# Patient Record
Sex: Male | Born: 1960 | Race: White | Hispanic: No | Marital: Married | Smoking: Former smoker
Health system: Southern US, Community
[De-identification: ages and names within clinical notes are randomized; demographics above are authoritative.]

## PROBLEM LIST (undated history)

## (undated) DIAGNOSIS — C801 Malignant (primary) neoplasm, unspecified: Secondary | ICD-10-CM

## (undated) DIAGNOSIS — M545 Low back pain, unspecified: Secondary | ICD-10-CM

## (undated) DIAGNOSIS — K219 Gastro-esophageal reflux disease without esophagitis: Secondary | ICD-10-CM

## (undated) DIAGNOSIS — R251 Tremor, unspecified: Secondary | ICD-10-CM

## (undated) DIAGNOSIS — C4490 Unspecified malignant neoplasm of skin, unspecified: Secondary | ICD-10-CM

## (undated) DIAGNOSIS — E785 Hyperlipidemia, unspecified: Secondary | ICD-10-CM

## (undated) DIAGNOSIS — G40909 Epilepsy, unspecified, not intractable, without status epilepticus: Secondary | ICD-10-CM

## (undated) DIAGNOSIS — K449 Diaphragmatic hernia without obstruction or gangrene: Secondary | ICD-10-CM

## (undated) HISTORY — PX: NO PAST SURGERIES: SHX2092

## (undated) HISTORY — PX: POLYPECTOMY: SHX149

## (undated) HISTORY — DX: Low back pain, unspecified: M54.50

## (undated) HISTORY — DX: Gastro-esophageal reflux disease without esophagitis: K21.9

## (undated) HISTORY — DX: Epilepsy, unspecified, not intractable, without status epilepticus: G40.909

## (undated) HISTORY — DX: Hyperlipidemia, unspecified: E78.5

## (undated) HISTORY — PX: COLONOSCOPY: SHX174

## (undated) HISTORY — DX: Unspecified malignant neoplasm of skin, unspecified: C44.90

## (undated) HISTORY — DX: Low back pain: M54.5

## (undated) HISTORY — DX: Malignant (primary) neoplasm, unspecified: C80.1

## (undated) HISTORY — DX: Tremor, unspecified: R25.1

---

## 2000-01-27 ENCOUNTER — Emergency Department (HOSPITAL_COMMUNITY): Admission: EM | Admit: 2000-01-27 | Discharge: 2000-01-27 | Payer: Self-pay

## 2000-01-27 ENCOUNTER — Encounter: Payer: Self-pay | Admitting: Emergency Medicine

## 2000-02-06 ENCOUNTER — Emergency Department (HOSPITAL_COMMUNITY): Admission: EM | Admit: 2000-02-06 | Discharge: 2000-02-06 | Payer: Self-pay | Admitting: Emergency Medicine

## 2000-02-10 ENCOUNTER — Emergency Department (HOSPITAL_COMMUNITY): Admission: EM | Admit: 2000-02-10 | Discharge: 2000-02-10 | Payer: Self-pay | Admitting: Emergency Medicine

## 2000-02-10 ENCOUNTER — Encounter: Payer: Self-pay | Admitting: Emergency Medicine

## 2000-04-28 ENCOUNTER — Emergency Department (HOSPITAL_COMMUNITY): Admission: EM | Admit: 2000-04-28 | Discharge: 2000-04-28 | Payer: Self-pay

## 2000-06-25 ENCOUNTER — Encounter: Admission: RE | Admit: 2000-06-25 | Discharge: 2000-07-07 | Payer: Self-pay | Admitting: Family Medicine

## 2004-09-24 ENCOUNTER — Ambulatory Visit: Payer: Self-pay | Admitting: Family Medicine

## 2006-02-04 ENCOUNTER — Ambulatory Visit: Payer: Self-pay | Admitting: Family Medicine

## 2006-11-16 ENCOUNTER — Ambulatory Visit: Payer: Self-pay | Admitting: Family Medicine

## 2007-04-21 ENCOUNTER — Ambulatory Visit: Payer: Self-pay | Admitting: Family Medicine

## 2007-04-21 DIAGNOSIS — K219 Gastro-esophageal reflux disease without esophagitis: Secondary | ICD-10-CM | POA: Insufficient documentation

## 2007-08-06 ENCOUNTER — Ambulatory Visit: Payer: Self-pay | Admitting: Family Medicine

## 2007-08-13 ENCOUNTER — Encounter (INDEPENDENT_AMBULATORY_CARE_PROVIDER_SITE_OTHER): Payer: Self-pay | Admitting: Internal Medicine

## 2008-05-26 ENCOUNTER — Encounter (INDEPENDENT_AMBULATORY_CARE_PROVIDER_SITE_OTHER): Payer: Self-pay | Admitting: *Deleted

## 2008-07-25 ENCOUNTER — Ambulatory Visit: Payer: Self-pay | Admitting: Family Medicine

## 2008-07-27 DIAGNOSIS — Z8669 Personal history of other diseases of the nervous system and sense organs: Secondary | ICD-10-CM | POA: Insufficient documentation

## 2008-08-04 ENCOUNTER — Ambulatory Visit: Payer: Self-pay | Admitting: Family Medicine

## 2008-08-08 ENCOUNTER — Telehealth (INDEPENDENT_AMBULATORY_CARE_PROVIDER_SITE_OTHER): Payer: Self-pay | Admitting: Internal Medicine

## 2008-08-11 ENCOUNTER — Encounter (INDEPENDENT_AMBULATORY_CARE_PROVIDER_SITE_OTHER): Payer: Self-pay | Admitting: Internal Medicine

## 2008-08-29 ENCOUNTER — Telehealth (INDEPENDENT_AMBULATORY_CARE_PROVIDER_SITE_OTHER): Payer: Self-pay | Admitting: Internal Medicine

## 2009-01-11 ENCOUNTER — Encounter (INDEPENDENT_AMBULATORY_CARE_PROVIDER_SITE_OTHER): Payer: Self-pay | Admitting: Internal Medicine

## 2009-01-11 ENCOUNTER — Telehealth (INDEPENDENT_AMBULATORY_CARE_PROVIDER_SITE_OTHER): Payer: Self-pay | Admitting: Internal Medicine

## 2009-01-12 ENCOUNTER — Ambulatory Visit: Payer: Self-pay | Admitting: Family Medicine

## 2009-01-15 LAB — CONVERTED CEMR LAB
ALT: 27 units/L (ref 0–53)
AST: 27 units/L (ref 0–37)
BUN: 18 mg/dL (ref 6–23)
CO2: 31 meq/L (ref 19–32)
Calcium: 9.8 mg/dL (ref 8.4–10.5)
Chloride: 103 meq/L (ref 96–112)
Cholesterol: 263 mg/dL — ABNORMAL HIGH (ref 0–200)
Creatinine, Ser: 1 mg/dL (ref 0.4–1.5)
Direct LDL: 172.2 mg/dL
GFR calc non Af Amer: 84.8 mL/min (ref >60)
Glucose, Bld: 95 mg/dL (ref 70–99)
HDL: 70.1 mg/dL (ref >39.00)
PSA: 0.77 ng/mL (ref 0.10–4.00)
Potassium: 4.5 meq/L (ref 3.5–5.1)
Sodium: 143 meq/L (ref 135–145)
TSH: 0.97 microintl units/mL (ref 0.35–5.50)
Total CHOL/HDL Ratio: 4
Triglycerides: 100 mg/dL (ref 0.0–149.0)
VLDL: 20 mg/dL (ref 0.0–40.0)

## 2009-01-26 ENCOUNTER — Ambulatory Visit: Payer: Self-pay | Admitting: Family Medicine

## 2009-01-26 DIAGNOSIS — E785 Hyperlipidemia, unspecified: Secondary | ICD-10-CM | POA: Insufficient documentation

## 2009-04-27 ENCOUNTER — Ambulatory Visit: Payer: Self-pay | Admitting: Family Medicine

## 2009-05-01 LAB — CONVERTED CEMR LAB
Cholesterol: 275 mg/dL — ABNORMAL HIGH (ref 0–200)
HDL: 63.5 mg/dL (ref >39.00)
VLDL: 26.2 mg/dL (ref 0.0–40.0)

## 2009-05-30 ENCOUNTER — Ambulatory Visit: Payer: Self-pay | Admitting: Family Medicine

## 2009-07-02 ENCOUNTER — Ambulatory Visit: Payer: Self-pay | Admitting: Family Medicine

## 2009-07-04 LAB — CONVERTED CEMR LAB
AST: 21 units/L (ref 0–37)
Cholesterol: 172 mg/dL (ref 0–200)
LDL Cholesterol: 94 mg/dL (ref 0–99)
Total CHOL/HDL Ratio: 3
Triglycerides: 82 mg/dL (ref 0.0–149.0)
VLDL: 16.4 mg/dL (ref 0.0–40.0)

## 2009-11-27 ENCOUNTER — Emergency Department (HOSPITAL_COMMUNITY): Admission: EM | Admit: 2009-11-27 | Discharge: 2009-11-28 | Payer: Self-pay | Admitting: Emergency Medicine

## 2009-11-27 ENCOUNTER — Encounter: Payer: Self-pay | Admitting: Family Medicine

## 2009-11-29 ENCOUNTER — Ambulatory Visit: Payer: Self-pay | Admitting: Family Medicine

## 2009-12-04 ENCOUNTER — Telehealth (INDEPENDENT_AMBULATORY_CARE_PROVIDER_SITE_OTHER): Payer: Self-pay | Admitting: *Deleted

## 2009-12-05 ENCOUNTER — Ambulatory Visit: Payer: Self-pay | Admitting: Cardiology

## 2009-12-05 ENCOUNTER — Encounter (HOSPITAL_COMMUNITY): Admission: RE | Admit: 2009-12-05 | Discharge: 2010-02-19 | Payer: Self-pay | Admitting: Family Medicine

## 2009-12-05 ENCOUNTER — Ambulatory Visit: Payer: Self-pay

## 2010-01-03 ENCOUNTER — Ambulatory Visit: Payer: Self-pay | Admitting: Family Medicine

## 2010-01-03 LAB — CONVERTED CEMR LAB
ALT: 20 units/L (ref 0–53)
AST: 22 units/L (ref 0–37)
Cholesterol: 193 mg/dL (ref 0–200)
LDL Cholesterol: 108 mg/dL — ABNORMAL HIGH (ref 0–99)
VLDL: 18.6 mg/dL (ref 0.0–40.0)

## 2010-01-17 ENCOUNTER — Ambulatory Visit: Payer: Self-pay | Admitting: Family Medicine

## 2010-08-20 NOTE — Assessment & Plan Note (Signed)
Summary: F/U [place] ON 11/27/09/[facility] PT/CLE   Vital Signs:  Patient profile:   50 year old male Height:      75.5 inches Weight:      234.6 pounds BMI:     29.04 Temp:     98.1 degrees F oral Pulse rate:   84 / minute Pulse rhythm:   regular BP sitting:   118 / 78  (left arm) Cuff size:   regular  Vitals Entered By: Benny Lennert CMA Duncan Dull) (Nov 29, 2009 12:21 PM)  History of Present Illness: Chief complaint follow up [place] 11-27-09 (vertigo)  50 year old male:  1. Chest pain, recent r/o in ER CP unit, 11/27/2009: Cardiac Risk factors: Lipids h/o tob CE negative x 2 at ER FH cardiac disease: mother, father, multiple grandparents, sister with a CVA  EKG negative at ER  Chest pain last night Initially was having substernal chest pain, radiating to L arm. Nausea. Began 2 days ago while at work and emergently to the ER.   2. Vertigo -- on antivert. Given some in the ER, has been having for a week or so, and symptoms resolve with meds. Inducible with head motion.  Tuesday was at work and around two thirty, started to get hot in the face. When he laid down, got to get a lot of dizziness. Took glasses off and was getiting beetter and worse and also had some chest pain  Allergies: 1)  ! Gaviscon 2)  ! Nexium (Esomeprazole Magnesium)  Past History:  Past medical, surgical, family and social histories (including risk factors) reviewed, and no changes noted (except as noted below).  Past Medical History: HYPERLIPIDEMIA (ICD-272.4) CHEST PAIN (ICD-786.50) SEIZURES, HX OF (DR. LOVE) (ICD-V12.49) GERD (ICD-530.81)  Family History: Reviewed history from 04/21/2007 and no changes required. DM-0 MI-multiple CVA- sister Prostate Cancer-0 Breast Cancer-0 Ovarian Cancer-0 Uterine Cancer-0 Colon Cancer-0 Drug/ ETOH Abuse- br--beer Depression-   Social History: Reviewed history from 04/21/2007 and no changes required. Marital Status: Married Children:    Occupation:   Review of Systems      See HPI CV:  Complains of chest pain or discomfort; denies palpitations, swelling of feet, and swelling of hands. Neuro:  Complains of sensation of room spinning.  Physical Exam  General:  Well-developed,well-nourished,in no acute distress; alert,appropriate and cooperative throughout examination Head:  Normocephalic and atraumatic without obvious abnormalities. No apparent alopecia or balding. Ears:  External ear exam shows no significant lesions or deformities.  Otoscopic examination revealscerumen impaction. Hearing is grossly normal bilaterally.  With rotational motions on stool, vertigo induced Nose:  no external deformity.   Mouth:  Oral mucosa and oropharynx without lesions or exudates.  Teeth in good repair. Neck:  No deformities, masses, or tenderness noted. Lungs:  Normal respiratory effort, chest expands symmetrically. Lungs are clear to auscultation, no crackles or wheezes. Heart:  Normal rate and regular rhythm. S1 and S2 normal without gallop, murmur, click, rub or other extra sounds. Extremities:  No clubbing, cyanosis, edema, or deformity noted with normal full range of motion of all joints.   Neurologic:  alert & oriented X3.   Cervical Nodes:  No lymphadenopathy noted Psych:  Cognition and judgment appear intact. Alert and cooperative with normal attention span and concentration. No apparent delusions, illusions, hallucinations   Impression & Recommendations:  Problem # 1:  CHEST PAIN (ICD-786.50) Substernal chest pain Arm tingling, left nausea  EKG and CE enzymes negative, but patient with very strong FH of CAD, stroke,  current hyperlipidemia, h/o smoking. Significant risk of CAD -- never had a stress test. Will obtain an exercise cardiolite to evaluate for coronary artery disease. There is significant clinical risk in this case, and further evaluation is needed.  Orders: Cardiolite (Cardiolite)  Problem # 2:  VERTIGO  (ICD-780.4) Assessment: New  His updated medication list for this problem includes:    Antivert 25 Mg Tabs (Meclizine hcl) .Marland Kitchen... As needed  Demonstrated maneuvers to self-treat vertigo. Patient to call to be seen if no improvement in 10-14 days, sooner if worse.   Problem # 3:  HYPERLIPIDEMIA (ICD-272.4)  His updated medication list for this problem includes:    Simvastatin 40 Mg Tabs (Simvastatin) .Marland Kitchen... Take 1 each day for cholesterol  Problem # 4:  CERUMEN IMPACTION, BILATERAL (ICD-380.4) ears cleaned  Complete Medication List: 1)  Lansoprazole 30 Mg Cpdr (Lansoprazole) .Marland Kitchen.. 1 by mouth daily 2)  Dilantin 100 Mg Caps (Phenytoin sodium extended) .... 2 capsules in am and 3 capsules at night 3)  Keppra 500 Mg Tabs (Levetiracetam) .... 1/2 tabet twice a day by mouth 4)  Mucinex 600 Mg Xr12h-tab (Guaifenesin) .... Otc as directed. 5)  Simvastatin 40 Mg Tabs (Simvastatin) .... Take 1 each day for cholesterol 6)  Antivert 25 Mg Tabs (Meclizine hcl) .... As needed  Patient Instructions: 1)  Referral Appointment Information 2)  Day/Date: 3)  Time: 4)  Place/MD: 5)  Address: 6)  Phone/Fax: 7)  Patient given appointment information. Information/Orders faxed/mailed.  Prescriptions: LANSOPRAZOLE 30 MG CPDR (LANSOPRAZOLE) 1 by mouth daily  #30 x 5   Entered and Authorized by:   Hannah Beat MD   Signed by:   Hannah Beat MD on 11/29/2009   Method used:   [facility]ron[facility] to        [facility]  [address] [phone][facility] (retail)       [address]       [place], [place]  [address]       Ph: [phone]       Fax: [phone]   RxID:   [id number]   Current Allergies (reviewed today): ! GAVISCON ! NEXIUM (ESOMEPRAZOLE MAGNESIUM)

## 2010-08-20 NOTE — Letter (Signed)
Summary: Out of Work  [facility]  [address] [place], [place] [address]   Phone: [phone]  Fax: [phone]    Nov 29, 2009   Employee:  Wayne Vang    To Whom It May Concern:   For Medical reasons, please excuse the above named employee from work for the following dates:  Start:   11/27/2009  End:   May return to work on 11/30/2009  If you need additional information, please feel free to contact our office.         Sincerely,    Hannah Beat, MD

## 2010-08-20 NOTE — Assessment & Plan Note (Signed)
Summary: Cardiology Nuclear Study  Nuclear Med Background Indications for Stress Test: Evaluation for Ischemia, Post Hospital  Indications Comments: 11/27/09 [facility] ER CP/Dizziness (-) enzymes    Symptoms: Chest Pain, Dizziness, Light-Headedness, Nausea    Nuclear Pre-Procedure Cardiac Risk Factors: Family History - CAD, History of Smoking Caffeine/Decaff Intake: None NPO After: 6:00 PM Lungs: clear IV 0.9% NS with Angio Cath: 18g     IV Site: (L) AC IV Started by: Stanton Kidney EMT-P Chest Size (in) 44     Height (in): 75.5 Weight (lb): 237 BMI: 29.34  Nuclear Med Study 1 or 2 day study:  1 day     Stress Test Type:  Stress Reading MD:  Olga Millers, MD     Referring MD:  Kerin Perna Resting Radionuclide:  Technetium 25m Tetrofosmin     Resting Radionuclide Dose:  11 mCi  Stress Radionuclide:  Technetium 50m Tetrofosmin     Stress Radionuclide Dose:  33 mCi   Stress Protocol Exercise Time (min):  7:30 min     Max HR:  173 bpm     Predicted Max HR:  172 bpm  Max Systolic BP: 180 mm Hg     Percent Max HR:  100.58 %     METS: 9.3 Rate Pressure Product:  16109    Stress Test Technologist:  Milana Na EMT-P     Nuclear Technologist:  Domenic Polite CNMT  Rest Procedure  Myocardial perfusion imaging was performed at rest 45 minutes following the intravenous administration of Myoview Technetium 40m Tetrofosmin.  Stress Procedure  The patient exercised for 7:30. The patient stopped due to fatigue and denied any chest pain.  There were no significant ST-T wave changes and a rare pvc.  Myoview was injected at peak exercise and myocardial perfusion imaging was performed after a brief delay.  QPS Raw Data Images:  Acuisition technically good; normal left ventricular size. Stress Images:  There is normal uptake in all areas. Rest Images:  Normal homogeneous uptake in all areas of the myocardium. Subtraction (SDS):  No evidence of ischemia. Transient Ischemic Dilatation:   .96  (Normal <1.22)  Lung/Heart Ratio:  .32  (Normal <0.45)  Quantitative Gated Spect Images QGS EDV:  104 ml QGS ESV:  31 ml QGS EF:  70 % QGS cine images:  Normal wall motion.   Overall Impression  Exercise Capacity: Fair exercise capacity. BP Response: Normal blood pressure response. Clinical Symptoms: No chest pain ECG Impression: No significant ST segment change suggestive of ischemia. Overall Impression: There is no sign of scar or ischemia.  Appended Document: Cardiology Nuclear Study call  patients stress test was perfectly normal very good  Appended Document: Cardiology Nuclear Study Patient advised.Consuello Masse CMA

## 2010-08-20 NOTE — Progress Notes (Signed)
Summary: Nuclear Pre-Procedure  Phone Note Outgoing Call   Call placed by: Milana Na, EMT-P,  Dec 04, 2009 3:44 PM Summary of Call: Left message with information on Myoview Information Sheet (see scanned document for details).      Nuclear Med Background Indications for Stress Test: Evaluation for Ischemia, Post Hospital  Indications Comments: 11/27/09 [facility] ER CP/Dizziness (-) enzymes    Symptoms: Chest Pain, Nausea    Nuclear Pre-Procedure Cardiac Risk Factors: Family History - CAD, History of Smoking Height (in): 75.5  Nuclear Med Study Referring MD:  Kerin Perna

## 2010-08-20 NOTE — Assessment & Plan Note (Addendum)
Summary: F/U LABWORK/CLE   Vital Signs:  Patient profile:   50 year old male Height:      75.5 inches Weight:      239.8 pounds BMI:     29.68 Temp:     98.5 degrees F oral Pulse rate:   84 / minute Pulse rhythm:   regular BP sitting:   130 / 80  (left arm) Cuff size:   regular  Vitals Entered By: Benny Lennert CMA Duncan Dull) (January 17, 2010 8:13 AM)  History of Present Illness: Chief complaint followup labwork  50 year old  f/u Chol: reviewed labs, tol meds fine not following good diet  ROS: GEN: No acute illnesses, no fevers, chills, sweats, fatigue, weight loss, or URI sx. GI: No n/v/d Pulm: No SOB, cough, wheezing Interactive and getting along well at home.  Otherwise, ROS is as per the HPI.   GEN: WDWN, NAD, Non-toxic, A & O x 3 HEENT: Atraumatic, Normocephalic. Neck supple. No masses, No LAD. Ears and Nose: No external deformity. CV: RRR, No M/G/R. No JVD. No thrill. No extra heart sounds. PULM: CTA B, no wheezes, crackles, rhonchi. No retractions. No resp. distress. No accessory muscle use. EXTR: No c/c/e NEURO: Normal gait.  PSYCH: Normally interactive. Conversant. Not depressed or anxious appearing.  Calm demeanor.    Clinical Review Panels:  Prevention   Last PSA:  0.77 (01/12/2009)  Lipid Management   Cholesterol:  193 (01/03/2010)   LDL (bad choesterol):  108 (01/03/2010)   HDL (good cholesterol):  66.40 (01/03/2010)   Allergies: 1)  ! Gaviscon 2)  ! Nexium (Esomeprazole Magnesium)  Past History:  Past medical, surgical, family and social histories (including risk factors) reviewed, and no changes noted (except as noted below).  Past Medical History: Reviewed history from 11/29/2009 and no changes required. HYPERLIPIDEMIA (ICD-272.4) CHEST PAIN (ICD-786.50) SEIZURES, HX OF (DR. LOVE) (ICD-V12.49) GERD (ICD-530.81)  Family History: Reviewed history from 11/29/2009 and no changes required. DM-0 MI-multiple CVA- sister Prostate  Cancer-0 Breast Cancer-0 Ovarian Cancer-0 Uterine Cancer-0 Colon Cancer-0 Drug/ ETOH Abuse- br--beer Depression-   Social History: Reviewed history from 04/21/2007 and no changes required. Marital Status: Married Children:  Occupation:    Impression & Recommendations:  Problem # 1:  HYPERLIPIDEMIA (ICD-272.4) stable  His updated medication list for this problem includes:    Simvastatin 40 Mg Tabs (Simvastatin) .Marland Kitchen... Take 1 each day for cholesterol  Complete Medication List: 1)  Lansoprazole 30 Mg Cpdr (Lansoprazole) .Marland Kitchen.. 1 by mouth daily 2)  Dilantin 100 Mg Caps (Phenytoin sodium extended) .... 2 capsules in am and 3 capsules at night 3)  Keppra 500 Mg Tabs (Levetiracetam) .... 1/2 tabet twice a day by mouth 4)  Mucinex 600 Mg Xr12h-tab (Guaifenesin) .... Otc as directed. 5)  Simvastatin 40 Mg Tabs (Simvastatin) .... Take 1 each day for cholesterol 6)  Antivert 25 Mg Tabs (Meclizine hcl) .... As needed  Patient Instructions: 1)  PHYSICAL NEXT YEAR 2)  Prephysical Labs, several days before, fasting 3)  BMP, HFP, FLP, CBC with diff, TSH, PSA: 272.4, v77.1, ,780.79, v76.44   Current Allergies (reviewed today): ! GAVISCON ! NEXIUM (ESOMEPRAZOLE MAGNESIUM)  Appended Document: F/U LABWORK/CLE discuss at cpx

## 2010-09-03 ENCOUNTER — Encounter (INDEPENDENT_AMBULATORY_CARE_PROVIDER_SITE_OTHER): Payer: Self-pay | Admitting: *Deleted

## 2010-09-03 ENCOUNTER — Other Ambulatory Visit: Payer: Self-pay | Admitting: Family Medicine

## 2010-09-03 ENCOUNTER — Other Ambulatory Visit (INDEPENDENT_AMBULATORY_CARE_PROVIDER_SITE_OTHER): Payer: PRIVATE HEALTH INSURANCE

## 2010-09-03 DIAGNOSIS — R5381 Other malaise: Secondary | ICD-10-CM | POA: Insufficient documentation

## 2010-09-03 DIAGNOSIS — R5383 Other fatigue: Secondary | ICD-10-CM | POA: Insufficient documentation

## 2010-09-03 DIAGNOSIS — E785 Hyperlipidemia, unspecified: Secondary | ICD-10-CM

## 2010-09-03 DIAGNOSIS — Z131 Encounter for screening for diabetes mellitus: Secondary | ICD-10-CM

## 2010-09-03 DIAGNOSIS — Z125 Encounter for screening for malignant neoplasm of prostate: Secondary | ICD-10-CM

## 2010-09-03 LAB — HEPATIC FUNCTION PANEL
ALT: 22 U/L (ref 0–53)
AST: 22 U/L (ref 0–37)
Alkaline Phosphatase: 85 U/L (ref 39–117)
Total Bilirubin: 0.7 mg/dL (ref 0.3–1.2)

## 2010-09-03 LAB — CBC WITH DIFFERENTIAL/PLATELET
Basophils Absolute: 0 10*3/uL (ref 0.0–0.1)
Eosinophils Absolute: 0.3 10*3/uL (ref 0.0–0.7)
HCT: 47 % (ref 39.0–52.0)
Hemoglobin: 16.4 g/dL (ref 13.0–17.0)
Lymphs Abs: 1.7 10*3/uL (ref 0.7–4.0)
MCHC: 34.9 g/dL (ref 30.0–36.0)
MCV: 90 fl (ref 78.0–100.0)
Monocytes Absolute: 0.4 10*3/uL (ref 0.1–1.0)
Neutro Abs: 3.3 10*3/uL (ref 1.4–7.7)
Platelets: 174 10*3/uL (ref 150.0–400.0)
RDW: 13.2 % (ref 11.5–14.6)

## 2010-09-03 LAB — BASIC METABOLIC PANEL
BUN: 14 mg/dL (ref 6–23)
CO2: 30 mEq/L (ref 19–32)
Glucose, Bld: 88 mg/dL (ref 70–99)
Potassium: 4.4 mEq/L (ref 3.5–5.1)
Sodium: 143 mEq/L (ref 135–145)

## 2010-09-03 LAB — LIPID PANEL: Cholesterol: 178 mg/dL (ref 0–200)

## 2010-09-03 LAB — PSA: PSA: 0.49 ng/mL (ref 0.10–4.00)

## 2010-09-09 ENCOUNTER — Encounter: Payer: Self-pay | Admitting: Family Medicine

## 2010-09-09 ENCOUNTER — Encounter (INDEPENDENT_AMBULATORY_CARE_PROVIDER_SITE_OTHER): Payer: PRIVATE HEALTH INSURANCE | Admitting: Family Medicine

## 2010-09-09 DIAGNOSIS — Z0000 Encounter for general adult medical examination without abnormal findings: Secondary | ICD-10-CM

## 2010-09-17 NOTE — Assessment & Plan Note (Signed)
Summary: cpe DLO   Vital Signs:  Patient profile:   50 year old male Height:      75.5 inches Weight:      239.75 pounds BMI:     29.68 Temp:     98.6 degrees F oral Pulse rate:   84 / minute Pulse rhythm:   regular BP sitting:   120 / 84  (left arm) Cuff size:   regular  Vitals Entered By: Benny Lennert CMA Duncan Dull) (September 09, 2010 2:42 PM)  History of Present Illness: Chief complaint cpx  Has been feeling tired the last two months. Going to bed around  7-8 hours a night occ wakes.  snores some. has tried some breathe right strips.  sometimes will stop breathing when sleeping. makes sounds his lips.  he also within the last month or so d/c his coffee and tea intake, and he previously was drinking coffee all day long at work and then tea all evening at home  Preventive Screening-Counseling & Management  Alcohol-Tobacco     Alcohol drinks/day: <1     Alcohol type: beer     Smoking Status: quit     Packs/Day: 1.0     Year Started: 1990     Year Quit: 2002     Passive Smoke Exposure: no  Caffeine-Diet-Exercise     Caffeine use/day: 6-8     Does Patient Exercise: no     Exercise (avg: min/session): 7:30  Hep-HIV-STD-Contraception     HIV Risk: no     Sun Exposure-Excessive: occasionally  Safety-Violence-Falls     Seat Belt Use: 100      Drug Use:  no.    Clinical Review Panels:  Prevention   Last PSA:  0.49 (09/03/2010)  Lipid Management   Cholesterol:  178 (09/03/2010)   LDL (bad choesterol):  94 (09/03/2010)   HDL (good cholesterol):  61.10 (09/03/2010)  Diabetes Management   Creatinine:  0.9 (09/03/2010)  CBC   WBC:  5.7 (09/03/2010)   RBC:  5.22 (09/03/2010)   Hgb:  16.4 (09/03/2010)   Hct:  47.0 (09/03/2010)   Platelets:  174.0 (09/03/2010)   MCV  90.0 (09/03/2010)   MCHC  34.9 (09/03/2010)   RDW  13.2 (09/03/2010)   PMN:  58.0 (09/03/2010)   Lymphs:  29.9 (09/03/2010)   Monos:  6.2 (09/03/2010)   Eosinophils:  5.4 (09/03/2010)  Basophil:  0.5 (09/03/2010)  Complete Metabolic Panel   Glucose:  88 (09/03/2010)   Sodium:  143 (09/03/2010)   Potassium:  4.4 (09/03/2010)   Chloride:  105 (09/03/2010)   CO2:  30 (09/03/2010)   BUN:  14 (09/03/2010)   Creatinine:  0.9 (09/03/2010)   Albumin:  4.9 (09/03/2010)   Total Protein:  7.5 (09/03/2010)   Calcium:  9.5 (09/03/2010)   Total Bili:  0.7 (09/03/2010)   Alk Phos:  85 (09/03/2010)   SGPT (ALT):  22 (09/03/2010)   SGOT (AST):  22 (09/03/2010)   Current Problems (verified): 1)  Other Malaise and Fatigue  (ICD-780.79) 2)  Screening For Diabetes Mellitus  (ICD-V77.1) 3)  Hyperlipidemia  (ICD-272.4) 4)  Special Screening Malignant Neoplasm of Prostate  (ICD-V76.44) 5)  Health Screening  (ICD-V70.0) 6)  Seizures, Hx of (DR. LOVE)  (ICD-V12.49) 7)  Gerd  (ICD-530.81)  Allergies: 1)  ! Gaviscon 2)  ! Nexium (Esomeprazole Magnesium)  Past History:  Past medical, surgical, family and social histories (including risk factors) reviewed, and no changes noted (except as noted below).  Past Medical History:  HYPERLIPIDEMIA (ICD-272.4) SEIZURES, HX OF (DR. LOVE) (ICD-V12.49) GERD (ICD-530.81)  Family History: Reviewed history from 11/29/2009 and no changes required. DM-0 MI-multiple CVA- sister Prostate Cancer-0 Breast Cancer-0 Ovarian Cancer-0 Uterine Cancer-0 Colon Cancer-0 Drug/ ETOH Abuse- br--beer Depression-   Social History: Reviewed history from 04/21/2007 and no changes required. Marital Status: Married Children:  Occupation:   Review of Systems  General: Denies fever, chills, sweats, anorexia. FATIGUE AS ABOVE Eyes: Denies blurring, vision loss ENT: Denies earache, nasal congestion, nosebleeds, sore throat, and hoarseness.  Cardiovascular: Denies chest pains, palpitations, syncope, dyspnea on exertion,  Respiratory: Denies cough, dyspnea at rest, excessive sputum,wheeezing GI: Denies nausea, vomiting, diarrhea, constipation, change in  bowel habits, abdominal pain, melena, hematochezia GU: Denies dysuria, hematuria, discharge, urinary frequency, urinary hesitancy, nocturia, incontinence, genital sores, decreased libido Musculoskeletal: Denies back pain, joint pain Derm: Denies rash, itching Neuro: Denies  paresthesias, frequent falls, frequent headaches, and difficulty walking.  Psych: Denies depression, anxiety Endocrine: Denies cold intolerance, heat intolerance, polydipsia, polyphagia, polyuria, and unusual weight change.  Heme: Denies enlarged lymph nodes Allergy: No hayfever    Impression & Recommendations:  Problem # 1:  HEALTH SCREENING (ICD-V70.0) The patient's preventative maintenance and recommended screening tests for an annual wellness exam were reviewed in full today. Brought up to date unless services declined.  Counselled on the importance of diet, exercise, and its role in overall health and mortality. The patient's FH and SH was reviewed, including their home life, tobacco status, and drug and alcohol status.   for now, suspect recent fatigue more likely due to extreme coffee ingestion, then quitting abruptly. if continues in a couple months, will reeval.  Complete Medication List: 1)  Lansoprazole 30 Mg Cpdr (Lansoprazole) .Marland Kitchen.. 1 by mouth daily 2)  Dilantin 100 Mg Caps (Phenytoin sodium extended) .... 2 capsules in am and 3 capsules at night 3)  Keppra 500 Mg Tabs (Levetiracetam) .... 1/2 tabet twice a day by mouth 4)  Mucinex 600 Mg Xr12h-tab (Guaifenesin) .... Otc as directed. 5)  Simvastatin 40 Mg Tabs (Simvastatin) .... Take 1 each day for cholesterol 6)  Antivert 25 Mg Tabs (Meclizine hcl) .... As needed  Patient Instructions: 1)  recheck 1 year   Orders Added: 1)  Est. Patient 40-64 years [[id number]]    Current Allergies (reviewed today): ! GAVISCON ! NEXIUM (ESOMEPRAZOLE MAGNESIUM)  Prevention & Chronic Care Immunizations   Influenza vaccine: Not documented   Influenza vaccine  deferral: Not available  (09/09/2010)    Tetanus booster: Not documented    Pneumococcal vaccine: Not documented   Pneumococcal vaccine deferral: Not indicated  (09/09/2010)  Other Screening   Smoking status: quit  (09/09/2010)  Lipids   Total Cholesterol: 178  (09/03/2010)   LDL: 94  (09/03/2010)   LDL Direct: 194.1  (04/27/2009)   HDL: 61.10  (09/03/2010)   Triglycerides: 116.0  (09/03/2010)    SGOT (AST): 22  (09/03/2010)   SGPT (ALT): 22  (09/03/2010)   Alkaline phosphatase: 85  (09/03/2010)   Total bilirubin: 0.7  (09/03/2010)  Self-Management Support :    Lipid self-management support: Not documented    Physical Exam General Appearance: well developed, well nourished, no acute distress Eyes: conjunctiva and lids normal, PERRLA, EOMI Ears, Nose, Mouth, Throat: TM clear, nares clear, oral exam WNL Neck: supple, no lymphadenopathy, no thyromegaly, no JVD Respiratory: clear to auscultation and percussion, respiratory effort normal Cardiovascular: regular rate and rhythm, S1-S2, no murmur, rub or gallop, no bruits, peripheral pulses normal and symmetric, no cyanosis,  clubbing, edema or varicosities Chest: no scars, masses, tenderness; no asymmetry, skin changes, nipple discharge, no gynecomastia   Gastrointestinal: soft, non-tender; no hepatosplenomegaly, masses; active bowel sounds all quadrants, no masses, tenderness, hemorrhoids  Genitourinary: no hernia, testicular mass, penile discharge, priapism or prostate enlargement Lymphatic: no cervical, axillary or inguinal adenopathy Musculoskeletal: gait normal, muscle tone and strength WNL, no joint swelling, effusions, discoloration, crepitus  Skin: clear, good turgor, color WNL, no rashes, lesions, or ulcerations Neurologic: normal mental status, normal reflexes, normal strength, sensation, and motion Psychiatric: alert; oriented to person, place and time Other Exam:

## 2010-10-08 LAB — POCT I-STAT, CHEM 8
BUN: 13 mg/dL (ref 6–23)
Calcium, Ion: 1.06 mmol/L — ABNORMAL LOW (ref 1.12–1.32)
Chloride: 106 mEq/L (ref 96–112)
Potassium: 3.8 mEq/L (ref 3.5–5.1)
Sodium: 140 mEq/L (ref 135–145)

## 2010-10-08 LAB — POCT CARDIAC MARKERS
CKMB, poc: 2.9 ng/mL (ref 1.0–8.0)
Myoglobin, poc: 134 ng/mL (ref 12–200)
Troponin i, poc: <0.05 ng/mL (ref 0.00–0.09)

## 2010-10-08 LAB — DIFFERENTIAL
Basophils Absolute: 0.1 10*3/uL (ref 0.0–0.1)
Basophils Relative: 1 % (ref 0–1)
Monocytes Absolute: 0.4 10*3/uL (ref 0.1–1.0)
Neutro Abs: 8.6 10*3/uL — ABNORMAL HIGH (ref 1.7–7.7)
Neutrophils Relative %: 83 % — ABNORMAL HIGH (ref 43–77)

## 2010-10-08 LAB — PHENYTOIN LEVEL, TOTAL: Phenytoin Lvl: 15.4 ug/mL (ref 10.0–20.0)

## 2010-10-08 LAB — CBC
Hemoglobin: 15.7 g/dL (ref 13.0–17.0)
MCHC: 35 g/dL (ref 30.0–36.0)
Platelets: 156 10*3/uL (ref 150–400)
RDW: 13.1 % (ref 11.5–15.5)

## 2010-10-15 ENCOUNTER — Other Ambulatory Visit: Payer: Self-pay | Admitting: Family Medicine

## 2011-01-19 ENCOUNTER — Other Ambulatory Visit: Payer: Self-pay | Admitting: Family Medicine

## 2011-07-01 ENCOUNTER — Ambulatory Visit (INDEPENDENT_AMBULATORY_CARE_PROVIDER_SITE_OTHER): Payer: PRIVATE HEALTH INSURANCE | Admitting: Family Medicine

## 2011-07-01 ENCOUNTER — Encounter: Payer: Self-pay | Admitting: Family Medicine

## 2011-07-01 VITALS — BP 108/78 | HR 83 | Temp 98.1°F | Ht 75.0 in | Wt 243.4 lb

## 2011-07-01 DIAGNOSIS — M79609 Pain in unspecified limb: Secondary | ICD-10-CM

## 2011-07-01 DIAGNOSIS — M79643 Pain in unspecified hand: Secondary | ICD-10-CM

## 2011-07-01 DIAGNOSIS — M7580 Other shoulder lesions, unspecified shoulder: Secondary | ICD-10-CM

## 2011-07-01 DIAGNOSIS — M2550 Pain in unspecified joint: Secondary | ICD-10-CM

## 2011-07-01 DIAGNOSIS — M719 Bursopathy, unspecified: Secondary | ICD-10-CM

## 2011-07-01 MED ORDER — DICLOFENAC SODIUM 75 MG PO TBEC: 75.0000 mg | DELAYED_RELEASE_TABLET | Freq: Two times a day (BID) | ORAL | Status: AC

## 2011-07-01 NOTE — Progress Notes (Signed)
  Patient Name: Wayne Vang Date of Birth: 21-Sep-1960 Age: 50 y.o. Medical Record Number: [id number] Gender: male  History of Present Illness:  Wayne Vang is a 50 y.o. very pleasant male patient who presents with the following:  Having some pain in his hands. Will have pain in his right shoulder and both shoulder blades.  All started less than four months ago.  Also having pain between his shoulder blades.  Taking longer with healing  Multiple complaints regarding MCPs, wrists of bilateral hands. He also has some complaints of his right shoulder, pain with abduction and internal range of motion. He also does some point in the shoulder blade region bilaterally.  He also has some complaints about having some overall aching.  Most of this has been occurring over the last 3-4 months.  Past Medical History, Surgical History, Social History, Family History, and Problem List have been reviewed in EHR and updated if relevant.  Review of Systems:  GEN: No fevers, chills. Nontoxic. Primarily MSK c/o today. MSK: Detailed in the HPI GI: tolerating PO intake without difficulty Neuro: No numbness, parasthesias, or tingling associated. Otherwise the pertinent positives of the ROS are noted above.    Physical Examination: Filed Vitals:   07/01/11 1547  BP: 108/78  Pulse: 83  Temp: 98.1 F (36.7 C)  TempSrc: Oral  Height: 6\' 3"  (1.905 m)  Weight: 243 lb 6.4 oz (110.406 kg)  SpO2: 98%    Body mass index is 30.42 kg/(m^2).   GEN: Well-developed,well-nourished,in no acute distress; alert,appropriate and cooperative throughout examination HEENT: Normocephalic and atraumatic without obvious abnormalities. Ears, externally no deformities PULM: Breathing comfortably in no respiratory distress EXT: No clubbing, cyanosis, or edema PSYCH: Normally interactive. Cooperative during the interview. Pleasant. Friendly and conversant. Not anxious or depressed appearing. Normal, full  affect. MSK: Bilateral MCPs have boggy synovium. Strength is intact in his grip. Mild changes at the DIP and PIP joints bilaterally. Full range of motion in a nontender axial load test. Wrists are minimally boggy. Full range of motion at the elbow.  Full range of motion of the neck with a negative Spurling to exam.  Shoulder: R Inspection: No muscle wasting or winging Ecchymosis/edema: neg  AC joint, scapula, clavicle: NT Cervical spine: NT, full ROM Spurling's: neg Abduction: full, 5/5 Flexion: full, 5/5 IR, full, lift-off: 5/5 ER at neutral: full, 5/5 AC crossover: neg Neer: pos Hawkins: pos Drop Test: neg Empty Can: pos Supraspinatus insertion: mild-mod T Bicipital groove: NT Speed's: neg Yergason's: neg Sulcus sign: neg Scapular dyskinesis: none C5-T1 intact  Neuro: Sensation intact Grip 5/5   Assessment and Plan: 1. Polyarthralgia   2. Joint pain   3. Hand pain   4. Rotator cuff tendinitis     For now, discontinue Zocor. It is possible this could be contributing to the patient's multiple muscular skeletal complaints.  Start with anti-inflammatories. Recheck in 2-3 months.  He is also due to try to keep his arm and shoulder movements below his nose, he does have true right-sided cuff tendinopathy.

## 2011-07-01 NOTE — Patient Instructions (Signed)
Recheck in 3 months.

## 2011-09-10 ENCOUNTER — Ambulatory Visit: Payer: PRIVATE HEALTH INSURANCE | Admitting: Family Medicine

## 2011-10-20 ENCOUNTER — Other Ambulatory Visit: Payer: Self-pay | Admitting: Family Medicine

## 2011-12-17 ENCOUNTER — Encounter: Payer: Self-pay | Admitting: Family Medicine

## 2011-12-17 ENCOUNTER — Ambulatory Visit (INDEPENDENT_AMBULATORY_CARE_PROVIDER_SITE_OTHER): Payer: PRIVATE HEALTH INSURANCE | Admitting: Family Medicine

## 2011-12-17 VITALS — BP 120/80 | HR 92 | Temp 99.4°F | Wt 247.0 lb

## 2011-12-17 DIAGNOSIS — J209 Acute bronchitis, unspecified: Secondary | ICD-10-CM

## 2011-12-17 DIAGNOSIS — Z8669 Personal history of other diseases of the nervous system and sense organs: Secondary | ICD-10-CM

## 2011-12-17 MED ORDER — HYDROCODONE-HOMATROPINE 5-1.5 MG/5ML PO SYRP: ORAL_SOLUTION | ORAL | Status: AC

## 2011-12-17 MED ORDER — AZITHROMYCIN 250 MG PO TABS: ORAL_TABLET | ORAL | Status: AC

## 2011-12-17 NOTE — Patient Instructions (Signed)
Take Guaifenesin (400mg), take 11/2 tabs by mouth AM and NOON. Get GUAIFENESIN by  [facility] or [facility] and getting MUCOUS RELIEF EXPECTORANT/CONGESTION. DO NOT GET MUCINEX (Timed Release Guaifenesin)  

## 2011-12-17 NOTE — Progress Notes (Signed)
  Patient Name: Wayne Vang Date of Birth: 11-Mar-1961 Age: 50 y.o. Medical Record Number: [id number] Gender: male Date of Encounter: 12/17/2011  History of Present Illness:  Wayne Vang is a 51 y.o. very pleasant male patient who presents with the following:  Has been feeling pretty bad, started on Sunday evening, and thought that it might pass. Tues, had to leave and come home early from work. Then Tues startede to hurt a lot. Now chest is hurting a lot.  Significant and severe productive cough. The patient did not sleep much at all last night. He also is having some sore throat and some congestion. Minimal sinus pain. No earache. Generally feels poorly and is achy.  Quit about 10 years ago.  Swallowing really hurts a lot.   Went 4 wheeling on Sat. And got feet wet.   Past Medical History, Surgical History, Social History, Family History, Problem List, Medications, and Allergies have been reviewed and updated if relevant.  Review of Systems: ROS: GEN: Acute illness details above GI: Tolerating PO intake GU: maintaining adequate hydration and urination Pulm: No SOB Interactive and getting along well at home.  Otherwise, ROS is as per the HPI.   Physical Examination: Filed Vitals:   12/17/11 1545  BP: 120/80  Pulse: 92  Temp: 99.4 F (37.4 C)  TempSrc: Oral  Weight: 247 lb (112.038 kg)    There is no height on file to calculate BMI.   GEN: A and O x 3. WDWN. NAD.    ENT: Nose clear, ext NML.  No LAD.  No JVD.  TM's clear. Oropharynx clear.  PULM: Normal WOB, no distress. No crackles, wheezes, rhonchi. CV: RRR, no M/G/R, No rubs, No JVD.   EXT: warm and well-perfused, No c/c/e. PSYCH: Pleasant and conversant.   Assessment and Plan:  1. Bronchitis, acute  HYDROcodone-homatropine (HYCODAN) 5-1.5 MG/5ML syrup, azithromycin (ZITHROMAX Z-PAK) 250 MG tablet  2. SEIZURES, HX OF (DR. LOVE)     Respiratory infection in a former smoker. Also with history of seizures.  Gave him a list of medications that he could use the do not interact with his seizure medication. Hold Zithromax for now, he is not improving in 3 or 4 days, reasonable start this.  Orders Today: No orders of the defined types were placed in this encounter.    Medications Today: Meds ordered this encounter  Medications  . HYDROcodone-homatropine (HYCODAN) 5-1.5 MG/5ML syrup    Sig: 1 tsp po at night before bed prn cough    Dispense:  240 mL    Refill:  0  . azithromycin (ZITHROMAX Z-PAK) 250 MG tablet    Sig: Take 2 tablets (500 mg) on  Day 1,  followed by 1 tablet (250 mg) once daily on Days 2 through 5.    Dispense:  6 each    Refill:  0

## 2011-12-19 ENCOUNTER — Telehealth: Payer: Self-pay

## 2011-12-19 NOTE — Telephone Encounter (Signed)
Patient aware work note is available for pick-up

## 2011-12-19 NOTE — Telephone Encounter (Signed)
That is reasonable. Please help do this. Reason: acute illness.

## 2011-12-19 NOTE — Telephone Encounter (Signed)
pts wife request work excuse for 12/17/11 thru 12/19/11.Please advise.

## 2012-10-15 ENCOUNTER — Other Ambulatory Visit: Payer: Self-pay | Admitting: Family Medicine

## 2012-10-21 ENCOUNTER — Other Ambulatory Visit: Payer: Self-pay | Admitting: Family Medicine

## 2012-12-02 ENCOUNTER — Other Ambulatory Visit: Payer: Self-pay | Admitting: Diagnostic Neuroimaging

## 2012-12-07 ENCOUNTER — Ambulatory Visit (INDEPENDENT_AMBULATORY_CARE_PROVIDER_SITE_OTHER): Payer: PRIVATE HEALTH INSURANCE | Admitting: Diagnostic Neuroimaging

## 2012-12-07 ENCOUNTER — Encounter: Payer: Self-pay | Admitting: Diagnostic Neuroimaging

## 2012-12-07 VITALS — BP 122/80 | HR 77 | Temp 98.5°F | Ht 76.25 in | Wt 240.0 lb

## 2012-12-07 DIAGNOSIS — G40109 Localization-related (focal) (partial) symptomatic epilepsy and epileptic syndromes with simple partial seizures, not intractable, without status epilepticus: Secondary | ICD-10-CM | POA: Insufficient documentation

## 2012-12-07 NOTE — Progress Notes (Signed)
[facility]  PATIENT: Wayne Vang DOB: [birth year]  REFERRING CLINICIAN:  HISTORY FROM: patient REASON FOR VISIT: follow up   HISTORICAL  CHIEF COMPLAINT:  Chief Complaint  Patient presents with  . Establish Care    transfer of care from Dr Sandria Manly  . Seizures    HISTORY OF PRESENT ILLNESS:   UPDATE 12/07/12: since last visit patient doing well. No more seizures. Last seizure was in 2006 in nighttime sleep. His knees are intermittent episodes of spinning sensation, vertigo, lasting for a few minutes of time. Meclizine seems to help.  PRIOR HPI (11/03/12, Dr. Sandria Manly): 52 year old right-handed white married  male with a past history of recurrent dizziness beginning in 1985. EEG 08/09/1983 showed epileptiform activity bilaterally in the frontotemporal regions, more prominent on the right. His first generalized seizure was 08/04/1983 and the second occurred 09/07/1983. Wayne Vang has been on Dilantin or phenytoin medication since 09/07/1993. His last generalized major motor seizure was 08/13/2004. At that time Keppra was added to his regiment and he has not had a seizure since. Medications are phenytoin sodium capsules 100 mg 2 in the morning and 3 at night and levitercetam 500 mg one half tablet twice per day. He is also on protonix 40 mg once daily. He discontinued simvastatin because of muscle pain. 11/27/2009 he was at work and developed nausea without vomiting. He had to sit down. He felt as if he had to focus on the wall and be very still. He was seen at [facility] emergency room where a Chest x-ray was normal and subsequent stress cardiac test was normal. CBC, basic metabolic panel, CPK, and cardiac markers were normal. A phenytoin level was 15.4. He has not had recurrent symptoms. His last EEG 05/19/2010 was normal .His last MRI of the brain 08/21/2004 showed no significant abnormalities. There was a small benign CSF cyst noted in the right medial temporal lobe.He denies  macropsia,  micropsia, strange odors or tastes. The patient has headaches behind his eyes. Over the last 4 months he has  recurrent dizziness. His has  tingling in his left arm. At one point he was on meclizine, but  is no longer taking it. He feels dizzy  like "I'm on a boat". Sitting down or lying down helps. There is a sensation of "nearly spinning".The symptoms last 30 minutes to an hour. They can occur every 2-3 days. He is treating them with Dramamine 25 mg per day. He describes a lightheaded sensation or pressure sensation in the eyes.  He denies headaches, no vision, slurred speech, change in hearing, or ringing in his ears.  REVIEW OF SYSTEMS: Full 14 system review of systems performed and notable only for vertigo.  ALLERGIES: Allergies  Allergen Reactions  . Dilantin (Phenytoin Sodium Extended)   . Esomeprazole Magnesium     REACTION: abdominal pain  . Simvastatin   . Alum Hydroxide-Mag Carbonate     Medication interaction only    HOME MEDICATIONS: Outpatient Prescriptions Prior to Visit  Medication Sig Dispense Refill  . lansoprazole (PREVACID) 30 MG capsule TAKE ONE CAPSULE BY MOUTH EVERY DAY  30 capsule  11  . levETIRAcetam (KEPPRA) 500 MG tablet Take 250 mg by mouth every 12 (twelve) hours.        . meclizine (ANTIVERT) 25 MG tablet Take 25 mg by mouth as needed.        . phenytoin (DILANTIN) 100 MG ER capsule TAKE 2 TABLETS BY MOUTH IN THE MORNING, AND 3 TABLETS BY  MOUTH AT NIGHT  150 capsule  0  . lansoprazole (PREVACID) 30 MG capsule TAKE ONE CAPSULE BY MOUTH EVERY DAY  30 capsule  11   No facility-administered medications prior to visit.    PAST MEDICAL HISTORY: Past Medical History  Diagnosis Date  . Other and unspecified hyperlipidemia   . Seizure disorder   . Esophageal reflux   . Dizziness   . Tremor   . Low back pain     PAST SURGICAL HISTORY: No past surgical history on file.  FAMILY HISTORY: Family History  Problem Relation Age of Onset  . Stroke  Sister   . Alcohol abuse Brother   . Alzheimer's disease Mother   . Cancer Father     SOCIAL HISTORY:  History   Social History  . Marital Status: Married    Spouse Name: N/A    Number of Children: 1  . Years of Education: N/A   Occupational History  . Shop [employer]   Social History Main Topics  . Smoking status: Former Smoker    Quit date: 07/22/2011  . Smokeless tobacco: Not on file  . Alcohol Use: Yes     Comment: socially  . Drug Use: Not on file  . Sexually Active: Not on file   Other Topics Concern  . Not on file   Social History Narrative      He has been married 31+ years.   Caffeine Use: He drinks coke, tea, and coffee.     PHYSICAL EXAM  Filed Vitals:   12/07/12 1430  BP: 122/80  Pulse: 77  Temp: 98.5 F (36.9 C)  TempSrc: Oral  Height: 6' 4.25" (1.937 m)  Weight: 240 lb (108.863 kg)   Body mass index is 29.01 kg/(m^2).  GENERAL EXAM: Patient is in no distress  CARDIOVASCULAR: Regular rate and rhythm, no murmurs, no carotid bruits  NEUROLOGIC: MENTAL STATUS: awake, alert, language fluent, comprehension intact, naming intact CRANIAL NERVE: no papilledema on fundoscopic exam, pupils equal and reactive to light, visual fields full to confrontation, extraocular muscles intact, no nystagmus, facial sensation and strength symmetric, uvula midline, shoulder shrug symmetric, tongue midline. MOTOR: normal bulk and tone, full strength in the BUE, BLE SENSORY: normal and symmetric to light touch, pinprick, temperature, vibration and proprioception COORDINATION: finger-nose-finger, fine finger movements normal REFLEXES: deep tendon reflexes present and symmetric GAIT/STATION: narrow based gait; able to walk on toes, heels and tandem; romberg is negative   DIAGNOSTIC DATA (LABS, IMAGING, TESTING) - I reviewed patient records, labs, notes, testing and imaging myself where available.  Lab Results  Component Value Date   WBC  5.7 09/03/2010   HGB 16.4 09/03/2010   HCT 47.0 09/03/2010   MCV 90.0 09/03/2010   PLT 174.0 09/03/2010      Component Value Date/Time   NA 143 09/03/2010 0845   K 4.4 09/03/2010 0845   CL 105 09/03/2010 0845   CO2 30 09/03/2010 0845   GLUCOSE 88 09/03/2010 0845   BUN 14 09/03/2010 0845   CREATININE 0.9 09/03/2010 0845   CALCIUM 9.5 09/03/2010 0845   PROT 7.5 09/03/2010 0845   ALBUMIN 4.9 09/03/2010 0845   AST 22 09/03/2010 0845   ALT 22 09/03/2010 0845   ALKPHOS 85 09/03/2010 0845   BILITOT 0.7 09/03/2010 0845   GFRNONAA 84.80 01/12/2009 0909   Lab Results  Component Value Date   CHOL 178 09/03/2010   HDL 61.10 09/03/2010   LDLCALC 94 09/03/2010  LDLDIRECT 194.1 04/27/2009   TRIG 116.0 09/03/2010   CHOLHDL 3 09/03/2010   No results found for this basename: HGBA1C   No results found for this basename: VITAMINB12   Lab Results  Component Value Date   TSH 0.88 09/03/2010     11/12/12 MRI brain - mild chronic small vessel ischemic disease    ASSESSMENT AND PLAN  52 y.o. year old male  has a past medical history of Other and unspecified hyperlipidemia; Seizure disorder; Esophageal reflux; Dizziness; Tremor; and Low back pain. here with seizure disorder. Vertigo is of unclear etiology, but suspect peripheral vestibulopathy.  Will check labs and continue meds. If dilantin level is elevated, will reduce dosing.   Orders Placed This Encounter  Procedures  . CBC with Differential  . CMP  . Phenytoin level, total      Suanne Marker, MD 12/07/2012, 3:22 PM Certified in Neurology, Neurophysiology and Neuroimaging  [facility] [address], [address] [place], [place] [address] [phone]

## 2012-12-07 NOTE — Patient Instructions (Signed)
Continue current medications. I will check blood work today.

## 2012-12-08 LAB — CBC WITH DIFFERENTIAL/PLATELET
Basophils Absolute: 0 10*3/uL (ref 0.0–0.2)
HCT: 44.8 % (ref 37.5–51.0)
Hemoglobin: 16 g/dL (ref 12.6–17.7)
Immature Granulocytes: 0 % (ref 0–2)
Lymphocytes Absolute: 1.6 10*3/uL (ref 0.7–3.1)
MCH: 31.3 pg (ref 26.6–33.0)
MCHC: 35.7 g/dL (ref 31.5–35.7)
MCV: 88 fL (ref 79–97)
Monocytes Absolute: 0.4 10*3/uL (ref 0.1–0.9)
Neutrophils Absolute: 3.4 10*3/uL (ref 1.4–7.0)
RDW: 13 % (ref 12.3–15.4)

## 2012-12-08 LAB — COMPREHENSIVE METABOLIC PANEL
AST: 25 IU/L (ref 0–40)
Albumin: 5.4 g/dL (ref 3.5–5.5)
Alkaline Phosphatase: 89 IU/L (ref 39–117)
BUN/Creatinine Ratio: 10 (ref 9–20)
CO2: 30 mmol/L — ABNORMAL HIGH (ref 19–28)
Chloride: 99 mmol/L (ref 97–108)
GFR calc Af Amer: 108 mL/min/{1.73_m2} (ref >59)
Globulin, Total: 2.1 g/dL (ref 1.5–4.5)
Sodium: 140 mmol/L (ref 134–144)
Total Bilirubin: 0.6 mg/dL (ref 0.0–1.2)

## 2012-12-19 ENCOUNTER — Other Ambulatory Visit: Payer: Self-pay

## 2012-12-19 MED ORDER — LEVETIRACETAM 500 MG PO TABS: 250.0000 mg | ORAL_TABLET | Freq: Two times a day (BID) | ORAL | Status: DC

## 2012-12-19 MED ORDER — PHENYTOIN SODIUM EXTENDED 100 MG PO CAPS: ORAL_CAPSULE | ORAL | Status: DC

## 2013-01-24 ENCOUNTER — Encounter: Payer: Self-pay | Admitting: Internal Medicine

## 2013-01-24 ENCOUNTER — Encounter: Payer: Self-pay | Admitting: *Deleted

## 2013-01-24 ENCOUNTER — Ambulatory Visit (INDEPENDENT_AMBULATORY_CARE_PROVIDER_SITE_OTHER): Payer: PRIVATE HEALTH INSURANCE | Admitting: Internal Medicine

## 2013-01-24 VITALS — BP 128/88 | HR 73 | Temp 97.7°F | Wt 229.0 lb

## 2013-01-24 DIAGNOSIS — R110 Nausea: Secondary | ICD-10-CM

## 2013-01-24 MED ORDER — DIAZEPAM 5 MG PO TABS: 2.5000 mg | ORAL_TABLET | Freq: Three times a day (TID) | ORAL | Status: DC | PRN

## 2013-01-24 MED ORDER — PROMETHAZINE HCL 25 MG PO TABS: 25.0000 mg | ORAL_TABLET | Freq: Four times a day (QID) | ORAL | Status: DC | PRN

## 2013-01-24 NOTE — Progress Notes (Signed)
Subjective:    Patient ID: Wayne Vang, male    DOB: Nov 22, 1960, 52 y.o.   MRN: [id number]  HPI Here with wife Awoke with nausea 4 days ago--didn't vomit though (until 2AM today) Diarrhea (1 loose stool a day) and intermittent headaches Dizziness also--similar to past vertigo. Has to sit down --seems to be worse after standing up  excedrin helps the headaches No fever No insect bites  No problems with seizures This is not like them  Has had some postprandial cramping and running to the bathroom This has been what has happened daily for these past few days  Has tried meclizine 25mg -- twice. Didn't help  Current Outpatient Prescriptions on File Prior to Visit  Medication Sig Dispense Refill  . lansoprazole (PREVACID) 30 MG capsule TAKE ONE CAPSULE BY MOUTH EVERY DAY  30 capsule  11  . levETIRAcetam (KEPPRA) 500 MG tablet Take 0.5 tablets (250 mg total) by mouth every 12 (twelve) hours.  30 tablet  6  . meclizine (ANTIVERT) 25 MG tablet Take 25 mg by mouth 3 (three) times daily as needed for dizziness.       . phenytoin (DILANTIN) 100 MG ER capsule TWO IN THE MORNING AND THREE AT NIGHT  150 capsule  6   No current facility-administered medications on file prior to visit.    Allergies  Allergen Reactions  . Esomeprazole Magnesium     REACTION: abdominal pain  . Simvastatin   . Alum Hydroxide-Mag Carbonate     Medication interaction only    Past Medical History  Diagnosis Date  . Other and unspecified hyperlipidemia   . Seizure disorder   . Esophageal reflux   . Dizziness   . Tremor   . Low back pain     No past surgical history on file.  Family History  Problem Relation Age of Onset  . Stroke Sister   . Alcohol abuse Brother   . Alzheimer's disease Mother   . Cancer Father     History   Social History  . Marital Status: Married    Spouse Name: N/A    Number of Children: 1  . Years of Education: N/A   Occupational History  . Shop [employer]   Social History Main Topics  . Smoking status: Former Smoker    Quit date: 07/22/2011  . Smokeless tobacco: Never Used  . Alcohol Use: Yes     Comment: socially  . Drug Use: Not on file  . Sexually Active: Not on file   Other Topics Concern  . Not on file   Social History Narrative      He has been married 31+ years.   Caffeine Use: He drinks coke, tea, and coffee.   Review of Systems No cough or breathing problems Hungry but "afraid to eat"----- limiting diet to soup and toast (for example)    Objective:   Physical Exam  Constitutional: He appears well-developed and well-nourished. No distress.  Neck: Normal range of motion. Neck supple. No thyromegaly present.  Cardiovascular: Normal rate, regular rhythm and normal heart sounds.  Exam reveals no gallop.   No murmur heard. Pulmonary/Chest: Effort normal and breath sounds normal. No respiratory distress. He has no wheezes. He has no rales.  Abdominal: Soft. Bowel sounds are normal. He exhibits no distension and no mass. There is no tenderness. There is no rebound and no guarding.  Musculoskeletal: He exhibits no edema and no tenderness.  Lymphadenopathy:  He has no cervical adenopathy.  Neurological: He displays a negative Romberg sign. Coordination and gait normal.          Assessment & Plan:

## 2013-01-24 NOTE — Assessment & Plan Note (Signed)
Has vertigo also Not like in past---the meclizine hasn't helped No nystagmus or gait disturbance No signs of seizures Has had some swallowing problems but is on prevacid regularly Cramping and then loose stools  ?could this be mild gastroenteritis Will try phenergan Diazepam for persistent vertigo May need testing if this persists (but not sure what)

## 2013-01-26 ENCOUNTER — Encounter (HOSPITAL_COMMUNITY): Payer: Self-pay | Admitting: Emergency Medicine

## 2013-01-26 ENCOUNTER — Emergency Department (HOSPITAL_COMMUNITY)
Admission: EM | Admit: 2013-01-26 | Discharge: 2013-01-26 | Disposition: A | Discharge status: Home / Self Care | Payer: PRIVATE HEALTH INSURANCE | Attending: Emergency Medicine | Admitting: Emergency Medicine

## 2013-01-26 ENCOUNTER — Emergency Department (HOSPITAL_COMMUNITY): Payer: PRIVATE HEALTH INSURANCE

## 2013-01-26 DIAGNOSIS — R112 Nausea with vomiting, unspecified: Secondary | ICD-10-CM | POA: Insufficient documentation

## 2013-01-26 DIAGNOSIS — Z79899 Other long term (current) drug therapy: Secondary | ICD-10-CM | POA: Insufficient documentation

## 2013-01-26 DIAGNOSIS — K219 Gastro-esophageal reflux disease without esophagitis: Secondary | ICD-10-CM | POA: Insufficient documentation

## 2013-01-26 DIAGNOSIS — R197 Diarrhea, unspecified: Secondary | ICD-10-CM | POA: Insufficient documentation

## 2013-01-26 DIAGNOSIS — R109 Unspecified abdominal pain: Secondary | ICD-10-CM | POA: Insufficient documentation

## 2013-01-26 DIAGNOSIS — E785 Hyperlipidemia, unspecified: Secondary | ICD-10-CM | POA: Insufficient documentation

## 2013-01-26 DIAGNOSIS — Z87891 Personal history of nicotine dependence: Secondary | ICD-10-CM | POA: Insufficient documentation

## 2013-01-26 DIAGNOSIS — Z888 Allergy status to other drugs, medicaments and biological substances status: Secondary | ICD-10-CM | POA: Insufficient documentation

## 2013-01-26 DIAGNOSIS — G40909 Epilepsy, unspecified, not intractable, without status epilepticus: Secondary | ICD-10-CM | POA: Insufficient documentation

## 2013-01-26 DIAGNOSIS — K449 Diaphragmatic hernia without obstruction or gangrene: Secondary | ICD-10-CM | POA: Insufficient documentation

## 2013-01-26 HISTORY — DX: Diaphragmatic hernia without obstruction or gangrene: K44.9

## 2013-01-26 LAB — CBC WITH DIFFERENTIAL/PLATELET
Basophils Absolute: 0 10*3/uL (ref 0.0–0.1)
Basophils Relative: 0 % (ref 0–1)
Eosinophils Absolute: 0.2 10*3/uL (ref 0.0–0.7)
Eosinophils Relative: 3 % (ref 0–5)
HCT: 43.9 % (ref 39.0–52.0)
Lymphocytes Relative: 24 % (ref 12–46)
MCHC: 38 g/dL — ABNORMAL HIGH (ref 30.0–36.0)
MCV: 84.1 fL (ref 78.0–100.0)
Monocytes Absolute: 0.4 10*3/uL (ref 0.1–1.0)
Platelets: 170 10*3/uL (ref 150–400)
RDW: 12.2 % (ref 11.5–15.5)
WBC: 5.7 10*3/uL (ref 4.0–10.5)

## 2013-01-26 LAB — COMPREHENSIVE METABOLIC PANEL
ALT: 33 U/L (ref 0–53)
AST: 26 U/L (ref 0–37)
Albumin: 4.3 g/dL (ref 3.5–5.2)
Calcium: 9.2 mg/dL (ref 8.4–10.5)
Creatinine, Ser: 0.86 mg/dL (ref 0.50–1.35)
GFR calc non Af Amer: >90 mL/min (ref >90)
Sodium: 141 mEq/L (ref 135–145)
Total Protein: 7.2 g/dL (ref 6.0–8.3)

## 2013-01-26 MED ORDER — MORPHINE SULFATE 4 MG/ML IJ SOLN
4.0000 mg | Freq: Once | INTRAMUSCULAR | Status: AC
  Administered 2013-01-26: 4 mg via INTRAVENOUS
  Filled 2013-01-26: qty 1

## 2013-01-26 MED ORDER — SODIUM CHLORIDE 0.9 % IV BOLUS (SEPSIS)
1000.0000 mL | Freq: Once | INTRAVENOUS | Status: AC
  Administered 2013-01-26: 1000 mL via INTRAVENOUS

## 2013-01-26 MED ORDER — DIPHENOXYLATE-ATROPINE 2.5-0.025 MG PO TABS: 1.0000 | ORAL_TABLET | Freq: Four times a day (QID) | ORAL | Status: DC | PRN

## 2013-01-26 MED ORDER — DIPHENOXYLATE-ATROPINE 2.5-0.025 MG PO TABS
2.0000 | ORAL_TABLET | ORAL | Status: AC
  Administered 2013-01-26: 2 via ORAL
  Filled 2013-01-26: qty 2

## 2013-01-26 MED ORDER — ONDANSETRON HCL 4 MG/2ML IJ SOLN
4.0000 mg | Freq: Once | INTRAMUSCULAR | Status: AC
  Administered 2013-01-26: 4 mg via INTRAVENOUS
  Filled 2013-01-26: qty 2

## 2013-01-26 MED ORDER — IOHEXOL 300 MG/ML  SOLN
100.0000 mL | Freq: Once | INTRAMUSCULAR | Status: AC | PRN
  Administered 2013-01-26: 100 mL via INTRAVENOUS

## 2013-01-26 MED ORDER — IOHEXOL 300 MG/ML  SOLN
25.0000 mL | INTRAMUSCULAR | Status: AC
  Administered 2013-01-26: 25 mL via ORAL

## 2013-01-26 NOTE — ED Notes (Signed)
MD at bedside. 

## 2013-01-26 NOTE — ED Notes (Signed)
Patient states started having N/V/D last Thursday.   Patient states diarrhea still persists.   Patient claims has not vomited since Monday.

## 2013-01-26 NOTE — ED Notes (Signed)
Patient transported to CT 

## 2013-01-26 NOTE — ED Provider Notes (Signed)
History    CSN: [id number] Arrival date & time 01/26/13  7829  First MD Initiated Contact with Patient 01/26/13 [phone]     Chief Complaint  Patient presents with  . Diarrhea  . Nausea   (Consider location/radiation/quality/duration/timing/severity/associated sxs/prior Treatment) HPI Comments: Patient is a 52 year old male with a past medical history of GERD, hiatal hernia, and seizure disorder who presents with a 1 week history of abdominal pain. The pain is located in his LLQ and epigastrium and does not radiate. The pain is described as cramping and severe. The pain started gradually and progressively worsened since the onset. No alleviating/aggravating factors. The patient has tried pepto bismol and immodium for symptoms without relief. Associated symptoms include NVD. Patient denies fever, headache, chest pain, SOB, dysuria, constipation. Patient reports the nausea and vomiting has decreased over the past week but the diarrhea persists. He estimates having diarrhea about 8-12 times per day. Patient states he had a tooth pulled 3 weeks ago and finished a 2 week course of Penicillin 5 days ago.   Past Medical History  Diagnosis Date  . Other and unspecified hyperlipidemia   . Seizure disorder   . Esophageal reflux   . Dizziness   . Tremor   . Low back pain   . Hiatal hernia    History reviewed. No pertinent past surgical history. Family History  Problem Relation Age of Onset  . Stroke Sister   . Alcohol abuse Brother   . Alzheimer's disease Mother   . Cancer Father    History  Substance Use Topics  . Smoking status: Former Smoker    Quit date: 07/22/2011  . Smokeless tobacco: Never Used  . Alcohol Use: Yes     Comment: socially    Review of Systems  Gastrointestinal: Positive for nausea, vomiting, abdominal pain and diarrhea.  All other systems reviewed and are negative.    Allergies  Esomeprazole magnesium; Simvastatin; and Alum hydroxide-mag carbonate  Home  Medications   Current Outpatient Rx  Name  Route  Sig  Dispense  Refill  . diazepam (VALIUM) 5 MG tablet   Oral   Take 0.5-1 tablets (2.5-5 mg total) by mouth 3 (three) times daily as needed.   20 tablet   0     For vertigo   . lansoprazole (PREVACID) 30 MG capsule      TAKE ONE CAPSULE BY MOUTH EVERY DAY   30 capsule   11   . levETIRAcetam (KEPPRA) 500 MG tablet   Oral   Take 0.5 tablets (250 mg total) by mouth every 12 (twelve) hours.   30 tablet   6   . meclizine (ANTIVERT) 25 MG tablet   Oral   Take 25 mg by mouth 3 (three) times daily as needed for dizziness.          . phenytoin (DILANTIN) 100 MG ER capsule      TWO IN THE MORNING AND THREE AT NIGHT   150 capsule   6   . promethazine (PHENERGAN) 25 MG tablet   Oral   Take 1 tablet (25 mg total) by mouth every 6 (six) hours as needed for nausea.   20 tablet   0    BP 116/89  Pulse 91  Temp(Src) 97.8 F (36.6 C) (Oral)  Resp 17  Ht 6\' 3"  (1.905 m)  Wt 239 lb (108.41 kg)  BMI 29.87 kg/m2  SpO2 96% Physical Exam  Nursing note and vitals reviewed. Constitutional: He appears well-developed  and well-nourished. No distress.  HENT:  Head: Normocephalic and atraumatic.  Eyes: Conjunctivae and EOM are normal. No scleral icterus.  Cardiovascular: Normal rate and regular rhythm.  Exam reveals no gallop and no friction rub.   No murmur heard. Pulmonary/Chest: Effort normal and breath sounds normal. He has no wheezes. He has no rales. He exhibits no tenderness.  Abdominal: Soft. He exhibits no distension. There is tenderness. There is no rebound and no guarding.  Mild generalized tenderness to palpation. No focal tenderness to palpation or peritoneal signs.   Musculoskeletal: Normal range of motion.  Neurological: He is alert. Coordination normal.  Speech is goal-oriented. Moves limbs without ataxia.   Skin: Skin is warm and dry.  Psychiatric: He has a normal mood and affect. His behavior is normal.    ED  Course  Procedures (including critical care time) Labs Reviewed  CBC WITH DIFFERENTIAL - Abnormal; Notable for the following:    MCHC 38.0 (*)    All other components within normal limits  COMPREHENSIVE METABOLIC PANEL - Abnormal; Notable for the following:    Glucose, Bld 102 (*)    All other components within normal limits  CLOSTRIDIUM DIFFICILE BY PCR  LIPASE, BLOOD   Ct Abdomen Pelvis W Contrast  01/26/2013   *RADIOLOGY REPORT*  Clinical Data: Nausea, vomiting, diarrhea for 6 days.  CT ABDOMEN AND PELVIS WITH CONTRAST  Technique:  Multidetector CT imaging of the abdomen and pelvis was performed following the standard protocol during bolus administration of intravenous contrast.  Contrast: OMNIPAQUE IOHEXOL 300 MG/ML  SOLN  Comparison: None.  Findings: Clear lung bases.  Normal liver, spleen, pancreas, gallbladder, and adrenal glands.  No renal masses or hydronephrosis. Parapelvic cysts left renal collecting system. Nonaneurysmal atherosclerotic calcification of the aorta.  No retroperitoneal adenopathy.   No appendiceal inflammation.  No bladder calculi or distal ureteral calculi.  Mild prostatic enlargement.  No osseous findings.  The colon appears fluid filled ( liquid stool) without discrete wall thickening.  No evidence for diverticulosis or diverticulitis. No pericolonic inflammatory process.  No free fluid or free air.  IMPRESSION: Findings consistent liquid stool in the colon but no findings to strongly suggest colitis.  No bowel obstruction.  No free fluid or free air.  No pericolonic inflammatory process.  Otherwise unremarkable CT abdomen and pelvis.   Original Report Authenticated By: Davonna Belling, M.D.   1. Diarrhea     MDM  7:16 AM Labs pending. Patient will have morphine, zofran and lomotil for symptoms. Vitals stable and patient afebrile.   9:32 AM Labs unremarkable. Patient reports some relief after pain medication. Patient likely has colitis. Patient will have CT  abdomen pelvis.   11:55 AM CT abdomen pelvis shows liquid stool but no evidence of colitis or inflammatory process. Patient reports improvement of symptoms. Vitals stable and patient afebrile. Patient will be discharged with lomotil and instructions to follow up with PCP. Patient instructed to have clear liquid diet. Patient instructed to return to the ED with worsening or concerning symptoms.   Emilia Beck, PA-C 01/26/13 1159

## 2013-01-27 NOTE — ED Provider Notes (Signed)
Medical screening examination/treatment/procedure(s) were performed by non-physician practitioner and as supervising physician I was immediately available for consultation/collaboration. 52 yo man with abdominal pain for a week.  Pain localized on exam mainly to LLQ.  Concern for colitis --> CT of abdomen/pelvis, which showed colon filled with liquid stool but no inflammatory change.  Carleene Cooper III, MD 01/27/13 [phone]

## 2013-06-26 ENCOUNTER — Other Ambulatory Visit: Payer: Self-pay | Admitting: Diagnostic Neuroimaging

## 2013-06-26 NOTE — Telephone Encounter (Signed)
Former [facility] patient assigned to Dr Marjory Lies.  He was prescribing this med for quite some time.

## 2013-07-20 ENCOUNTER — Other Ambulatory Visit: Payer: Self-pay | Admitting: Diagnostic Neuroimaging

## 2013-07-21 HISTORY — PX: INGUINAL HERNIA REPAIR: SUR1180

## 2013-07-25 ENCOUNTER — Other Ambulatory Visit: Payer: Self-pay | Admitting: Diagnostic Neuroimaging

## 2013-10-24 ENCOUNTER — Ambulatory Visit (INDEPENDENT_AMBULATORY_CARE_PROVIDER_SITE_OTHER): Payer: PRIVATE HEALTH INSURANCE | Admitting: Family Medicine

## 2013-10-24 ENCOUNTER — Encounter: Payer: Self-pay | Admitting: Family Medicine

## 2013-10-24 VITALS — BP 100/78 | HR 90 | Temp 98.2°F | Ht 76.25 in | Wt 235.5 lb

## 2013-10-24 DIAGNOSIS — K4090 Unilateral inguinal hernia, without obstruction or gangrene, not specified as recurrent: Secondary | ICD-10-CM

## 2013-10-24 NOTE — Progress Notes (Signed)
Pre visit review using our clinic review tool, if applicable. No additional management support is needed unless otherwise documented below in the visit note. 

## 2013-10-24 NOTE — Patient Instructions (Signed)
REFERRALS TO SPECIALISTS, SPECIAL TESTS (MRI, CT, ULTRASOUNDS)  GO THE WAITING ROOM AND TELL CHECK IN YOU NEED HELP WITH A REFERRAL. Either MARION or LINDA will help you set it up.  If it is between 1-2 PM they may be at lunch.  After 5 PM, they will likely be at home.  They will call you, so please make sure the office has your correct phone number.  Referrals sometimes can be done same day if urgent, but others can take 2 or 3 days to get an appointment. Starting in 2015, many of the new Medicare insurance plans and Affordable Care Act (Obamacare) Health plans offered take much longer for referrals. They have added additional paperwork and steps.  MRI's and CT's can take up to a week for the test. (Emergencies like strokes take precedence. I will tell you if you have an emergency.)   If your referral is to an in-network [facility] office, their office may contact you directly prior to our office reaching you.  -- Examples: [facility], [facility], [facility], [facility], [facility], and many more.  Specialist appointment times vary a great deal, mostly on the specialist's schedule and if they have openings. -- Our office tries to get you in as fast as possible. -- Some specialists have very long wait times. (Example. Dermatology. Usually months) -- If you have a true emergency like new cancer, we work to get you in ASAP.   

## 2013-10-25 NOTE — Progress Notes (Signed)
Date:  10/24/2013   Name:  Wayne Vang   DOB:  1961/01/14   MRN:  [id number]  Primary Physician:  Nichelson Loffler, MD   Chief Complaint: Abdominal Pain   Subjective:   History of Present Illness:  Wayne Vang is a 53 y.o. pleasant patient who presents with the following:  Pleasant male with long-standing well controlled seizure disorder presents with some abdominal pain as well as pain in his groin region. He developed what feels like a protuberance in the lower RIGHT aspect of his hypogastric region and in the inguinal canal area. This is uncomfortable, and it hurts him when he lifts, cough, and particularly when he sneezes. 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 This has been interfering with his work.  Patient Active Problem List   Diagnosis Date Noted  . Nausea alone 01/24/2013  . 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 OTHER MALAISE AND FATIGUE 09/03/2010  . HYPERLIPIDEMIA 01/26/2009  . SEIZURES, HX OF (DR. LOVE) 07/27/2008  . GERD 04/21/2007    Past Medical History  Diagnosis Date  . Other and unspecified hyperlipidemia   . Seizure disorder   . Esophageal reflux   . Dizziness   . Tremor   . Low back pain   . Hiatal hernia     No past surgical history on file.  History   Social History  . Marital Status: Married    Spouse Name: N/A    Number of Children: 1  . Years of Education: N/A   Occupational History  . Shop [employer]   Social History Main Topics  . Smoking status: Former Smoker    Quit date: 07/22/2011  . Smokeless tobacco: Never Used  . Alcohol Use: Yes     Comment: socially  . Drug Use: No  . Sexual Activity: Not on file   Other Topics Concern  . Not on file   Social History Narrative      He has been married 31+ years.   Caffeine Use: He drinks coke, tea, and coffee.    Family History  Problem Relation Age of Onset  . Stroke Sister   . Alcohol abuse  Brother   . Alzheimer's disease Mother   . Cancer Father     Allergies  Allergen Reactions  . Esomeprazole Magnesium     REACTION: abdominal pain  . Simvastatin   . Alum Hydroxide-Mag Carbonate     Medication interaction only    Medication list has been reviewed and updated.  Review of Systems:   GEN: No acute illnesses, no fevers, chills. GI: No n/v/d, eating normally, as above Pulm: No SOB Interactive and getting along well at home.  Otherwise, ROS is as per the HPI.  Objective:   Physical Examination: BP 100/78  Pulse 90  Temp(Src) 98.2 F (36.8 C) (Oral)  Ht 6' 4.25" (1.937 m)  Wt 235 lb 8 oz (106.822 kg)  BMI 28.47 kg/m2  Ideal Body Weight: Weight in (lb) to have BMI = 25: 206.3   GEN: WDWN, NAD, Non-toxic, A & O x 3 HEENT: Atraumatic, Normocephalic. Neck supple. No masses, No LAD. Ears and Nose: No external deformity. CV: RRR, No M/G/R. No JVD. No thrill. No extra heart sounds. PULM: CTA B, no wheezes, crackles, rhonchi. No retractions. No resp. distress. No accessory muscle use. ABD: S, mildly tender in hypogastric region, ND, + BS, No rebound,  No HSM  GU: normal male, notable R inguinal hernia EXTR: No c/c/e NEURO Normal gait.  PSYCH: Normally interactive. Conversant. Not depressed or anxious appearing.  Calm demeanor.   Laboratory and Imaging Data:  Assessment & Plan:   Right inguinal hernia - Plan: Ambulatory referral to General Surgery  I think all of his symptoms are likely coming from his inguinal hernia. It is painful to him, and he has to lift all the time at work. My best advice was for him to discuss this with Gen. Surgery and consider operative intervention.  No Follow-up on file.  Orders Placed This Encounter  Procedures  . Ambulatory referral to General Surgery   Patient's Medications  New Prescriptions   No medications on file  Previous Medications   DIAZEPAM (VALIUM) 5 MG TABLET    Take 0.5-1 tablets (2.5-5 mg total) by mouth  3 (three) times daily as needed.   LANSOPRAZOLE (PREVACID) 30 MG CAPSULE    Take 30 mg by mouth daily.   LEVETIRACETAM (KEPPRA) 500 MG TABLET    TAKE 0.5 TABLETS (250 MG TOTAL) BY MOUTH EVERY 12 (TWELVE) HOURS.   MECLIZINE (ANTIVERT) 25 MG TABLET    TAKE AS DIRECTED   PHENYTOIN (DILANTIN) 100 MG ER CAPSULE    TAKE TWO IN THE MORNING AND THREE AT NIGHT  Modified Medications   No medications on file  Discontinued Medications   DIPHENOXYLATE-ATROPINE (LOMOTIL) 2.5-0.025 MG PER TABLET    Take 1 tablet by mouth 4 (four) times daily as needed for diarrhea or loose stools.   PHENYTOIN (DILANTIN) 100 MG ER CAPSULE    Take 200-300 mg by mouth 2 (two) times daily. Take 2 capsules in the morning and 3 capsules in the evening   PROMETHAZINE (PHENERGAN) 25 MG TABLET    Take 1 tablet (25 mg total) by mouth every 6 (six) hours as needed for nausea.   Patient Instructions  REFERRALS TO SPECIALISTS, SPECIAL TESTS (MRI, CT, ULTRASOUNDS)  GO THE WAITING ROOM AND TELL CHECK IN YOU NEED HELP WITH A REFERRAL. Either MARION or LINDA will help you set it up.  If it is between 1-2 PM they may be at lunch.  After 5 PM, they will likely be at home.  They will call you, so please make sure the office has your correct phone number.  Referrals sometimes can be done same day if urgent, but others can take 2 or 3 days to get an appointment. Starting in 2015, many of the new Medicare insurance plans and [place] (Obamacare) Health plans offered take much longer for referrals. They have added additional paperwork and steps.  MRI's and CT's can take up to a week for the test. (Emergencies like strokes take precedence. I will tell you if you have an emergency.)   If your referral is to an [facility] office, their office may contact you directly prior to our office reaching you.  -- Examples: [facility], [facility], [facility], [place]            Neurology, [facility]  Surgery, and many more.  Specialist appointment times vary a great deal, mostly on the specialist's schedule and if they have openings. -- Our office tries to get you in as fast as possible. -- Some specialists have very long wait times. (Example. Dermatology. Usually months) -- If you have a true emergency like new cancer, we work to get you in ASAP.     Signed,  Maud Deed.  Lataria Courser, MD, [place] [facility] [place] [address] Phone: [phone] Fax: [phone]  ,

## 2013-11-08 ENCOUNTER — Encounter (INDEPENDENT_AMBULATORY_CARE_PROVIDER_SITE_OTHER): Payer: Self-pay | Admitting: Surgery

## 2013-11-08 ENCOUNTER — Ambulatory Visit (INDEPENDENT_AMBULATORY_CARE_PROVIDER_SITE_OTHER): Payer: PRIVATE HEALTH INSURANCE | Admitting: Surgery

## 2013-11-08 VITALS — BP 126/74 | HR 72 | Temp 97.6°F | Ht 75.0 in | Wt 236.0 lb

## 2013-11-08 DIAGNOSIS — K4090 Unilateral inguinal hernia, without obstruction or gangrene, not specified as recurrent: Secondary | ICD-10-CM

## 2013-11-08 NOTE — Patient Instructions (Signed)

## 2013-11-08 NOTE — Progress Notes (Signed)
Patient ID: Wayne Vang, male   DOB: 02/05/1961, 53 y.o.   MRN: [id number]  Chief Complaint  Patient presents with  . Umbilical Hernia    HPI Wayne Vang is a 53 y.o. male.  Patient sent at the request of Dr. Edilia Bo for right inguinal hernia. Patient felt a bulge in right groin 2 months ago. Has pain with exertion, lifting or pushing in his right groin. Pain can be sharp at times with exertion. No nausea or vomiting. A bulge is noted. HPI  Past Medical History  Diagnosis Date  . Other and unspecified hyperlipidemia   . Seizure disorder   . Esophageal reflux   . Dizziness   . Tremor   . Low back pain   . Hiatal hernia     History reviewed. No pertinent past surgical history.  Family History  Problem Relation Age of Onset  . Stroke Sister   . Alcohol abuse Brother   . Alzheimer's disease Mother   . Cancer Father     Social History History  Substance Use Topics  . Smoking status: Former Smoker    Quit date: 07/22/2011  . Smokeless tobacco: Never Used  . Alcohol Use: Yes     Comment: socially    Allergies  Allergen Reactions  . Esomeprazole Magnesium     REACTION: abdominal pain  . Simvastatin   . Alum Hydroxide-Mag Carbonate     Medication interaction only    Current Outpatient Prescriptions  Medication Sig Dispense Refill  . diazepam (VALIUM) 5 MG tablet Take 0.5-1 tablets (2.5-5 mg total) by mouth 3 (three) times daily as needed.  20 tablet  0  . lansoprazole (PREVACID) 30 MG capsule Take 30 mg by mouth daily.      Marland Kitchen levETIRAcetam (KEPPRA) 500 MG tablet TAKE 0.5 TABLETS (250 MG TOTAL) BY MOUTH EVERY 12 (TWELVE) HOURS.  30 tablet  6  . meclizine (ANTIVERT) 25 MG tablet TAKE AS DIRECTED  90 tablet  4  . phenytoin (DILANTIN) 100 MG ER capsule TAKE TWO IN THE MORNING AND THREE AT NIGHT  150 capsule  3   No current facility-administered medications for this visit.    Review of Systems Review of Systems  Constitutional: Negative for fever, chills and  unexpected weight change.  HENT: Negative for congestion, hearing loss, sore throat, trouble swallowing and voice change.   Eyes: Negative for visual disturbance.  Respiratory: Negative for cough and wheezing.   Cardiovascular: Negative for chest pain, palpitations and leg swelling.  Gastrointestinal: Positive for abdominal pain. Negative for nausea, vomiting, diarrhea, constipation, blood in stool, abdominal distention, anal bleeding and rectal pain.  Genitourinary: Negative for hematuria and difficulty urinating.  Musculoskeletal: Negative for arthralgias.  Skin: Negative for rash and wound.  Neurological: Negative for seizures, syncope, weakness and headaches.  Hematological: Negative for adenopathy. Does not bruise/bleed easily.  Psychiatric/Behavioral: Negative for confusion.    Blood pressure 126/74, pulse 72, temperature 97.6 F (36.4 C), height 6\' 3"  (1.905 m), weight 236 lb (107.049 kg).  Physical Exam Physical Exam  Constitutional: He appears well-developed and well-nourished.  HENT:  Head: Normocephalic and atraumatic.  Eyes: EOM are normal. Pupils are equal, round, and reactive to light. No scleral icterus.  Neck: Normal range of motion. Neck supple.  Cardiovascular: Normal rate and regular rhythm.   Pulmonary/Chest: Effort normal and breath sounds normal.  Abdominal: A hernia is present. Hernia confirmed positive in the right inguinal area.    Musculoskeletal: Normal range of motion.  Neurological: He is alert.  Skin: Skin is warm.  Psychiatric: He has a normal mood and affect. His behavior is normal. Judgment and thought content normal.    Data Reviewed None   Assessment    Right inguinal hernia symptomatic    Plan    Recommend repair RIH. The risk of hernia repair include bleeding,  Infection,   Recurrence of the hernia,  Mesh use, chronic pain,  Organ injury,  Bowel injury,  Bladder injury,   nerve injury with numbness around the incision,  Death,  and  worsening of preexisting  medical problems.  The alternatives to surgery have been discussed as well..  Long term expectations of both operative and non operative treatments have been discussed.   The patient agrees to proceed.       Ashir Kunz A. Clydine Parkison 11/08/2013, 12:24 PM

## 2013-11-14 ENCOUNTER — Other Ambulatory Visit: Payer: Self-pay | Admitting: Family Medicine

## 2013-11-27 ENCOUNTER — Other Ambulatory Visit: Payer: Self-pay | Admitting: Diagnostic Neuroimaging

## 2013-12-01 ENCOUNTER — Encounter (HOSPITAL_BASED_OUTPATIENT_CLINIC_OR_DEPARTMENT_OTHER): Payer: Self-pay | Admitting: *Deleted

## 2013-12-01 NOTE — Progress Notes (Signed)
Pt to come in for cbc cmet per [facility]-pt does snore-hx seizures but not in several yr

## 2013-12-01 NOTE — Progress Notes (Signed)
12/01/13 1253  OBSTRUCTIVE SLEEP APNEA  Have you ever been diagnosed with sleep apnea through a sleep study? No  Do you snore loudly (loud enough to be heard through closed doors)?  1  Do you often feel tired, fatigued, or sleepy during the daytime? 0  Has anyone observed you stop breathing during your sleep? 1  Do you have, or are you being treated for high blood pressure? 0  BMI more than 35 kg/m2? 0  Age over 53 years old? 1  Neck circumference greater than 40 cm/16 inches? 1  Gender: 1  Obstructive Sleep Apnea Score 5  Score 4 or greater  Results sent to PCP

## 2013-12-02 ENCOUNTER — Encounter (HOSPITAL_BASED_OUTPATIENT_CLINIC_OR_DEPARTMENT_OTHER)
Admission: RE | Admit: 2013-12-02 | Discharge: 2013-12-02 | Disposition: A | Discharge status: Home / Self Care | Payer: PRIVATE HEALTH INSURANCE | Source: Ambulatory Visit | Attending: Surgery | Admitting: Surgery

## 2013-12-02 LAB — CBC WITH DIFFERENTIAL/PLATELET
BASOS ABS: 0 10*3/uL (ref 0.0–0.1)
Basophils Relative: 0 % (ref 0–1)
EOS PCT: 4 % (ref 0–5)
Eosinophils Absolute: 0.3 10*3/uL (ref 0.0–0.7)
HCT: 43.8 % (ref 39.0–52.0)
Hemoglobin: 15.8 g/dL (ref 13.0–17.0)
Lymphocytes Relative: 27 % (ref 12–46)
Lymphs Abs: 1.8 10*3/uL (ref 0.7–4.0)
MCH: 30.9 pg (ref 26.0–34.0)
MCHC: 36.1 g/dL — ABNORMAL HIGH (ref 30.0–36.0)
MCV: 85.7 fL (ref 78.0–100.0)
MONO ABS: 0.4 10*3/uL (ref 0.1–1.0)
Monocytes Relative: 6 % (ref 3–12)
Neutro Abs: 4.2 10*3/uL (ref 1.7–7.7)
Neutrophils Relative %: 63 % (ref 43–77)
Platelets: 154 10*3/uL (ref 150–400)
RBC: 5.11 MIL/uL (ref 4.22–5.81)
RDW: 12.3 % (ref 11.5–15.5)
WBC: 6.6 10*3/uL (ref 4.0–10.5)

## 2013-12-02 LAB — COMPREHENSIVE METABOLIC PANEL
ALBUMIN: 5 g/dL (ref 3.5–5.2)
ALT: 19 U/L (ref 0–53)
AST: 22 U/L (ref 0–37)
Alkaline Phosphatase: 90 U/L (ref 39–117)
BUN: 13 mg/dL (ref 6–23)
CALCIUM: 9.5 mg/dL (ref 8.4–10.5)
CO2: 24 mEq/L (ref 19–32)
CREATININE: 0.86 mg/dL (ref 0.50–1.35)
Chloride: 104 mEq/L (ref 96–112)
GFR calc Af Amer: >90 mL/min (ref >90)
Glucose, Bld: 86 mg/dL (ref 70–99)
Potassium: 4.2 mEq/L (ref 3.7–5.3)
Sodium: 143 mEq/L (ref 137–147)
Total Bilirubin: 0.6 mg/dL (ref 0.3–1.2)
Total Protein: 7.4 g/dL (ref 6.0–8.3)

## 2013-12-06 ENCOUNTER — Encounter (HOSPITAL_BASED_OUTPATIENT_CLINIC_OR_DEPARTMENT_OTHER): Payer: Self-pay | Admitting: *Deleted

## 2013-12-06 ENCOUNTER — Ambulatory Visit (HOSPITAL_BASED_OUTPATIENT_CLINIC_OR_DEPARTMENT_OTHER)
Admission: RE | Admit: 2013-12-06 | Discharge: 2013-12-06 | Disposition: A | Discharge status: Home / Self Care | Payer: PRIVATE HEALTH INSURANCE | Source: Ambulatory Visit | Attending: Surgery | Admitting: Surgery

## 2013-12-06 ENCOUNTER — Encounter (HOSPITAL_BASED_OUTPATIENT_CLINIC_OR_DEPARTMENT_OTHER): Payer: PRIVATE HEALTH INSURANCE | Admitting: Anesthesiology

## 2013-12-06 ENCOUNTER — Encounter (HOSPITAL_BASED_OUTPATIENT_CLINIC_OR_DEPARTMENT_OTHER)
Admission: RE | Disposition: A | Discharge status: Home / Self Care | Payer: Self-pay | Source: Ambulatory Visit | Attending: Surgery

## 2013-12-06 ENCOUNTER — Ambulatory Visit (HOSPITAL_BASED_OUTPATIENT_CLINIC_OR_DEPARTMENT_OTHER): Payer: PRIVATE HEALTH INSURANCE | Admitting: Anesthesiology

## 2013-12-06 DIAGNOSIS — Z9889 Other specified postprocedural states: Secondary | ICD-10-CM

## 2013-12-06 DIAGNOSIS — K219 Gastro-esophageal reflux disease without esophagitis: Secondary | ICD-10-CM | POA: Insufficient documentation

## 2013-12-06 DIAGNOSIS — Z888 Allergy status to other drugs, medicaments and biological substances status: Secondary | ICD-10-CM | POA: Insufficient documentation

## 2013-12-06 DIAGNOSIS — G40909 Epilepsy, unspecified, not intractable, without status epilepticus: Secondary | ICD-10-CM | POA: Insufficient documentation

## 2013-12-06 DIAGNOSIS — Z87891 Personal history of nicotine dependence: Secondary | ICD-10-CM | POA: Insufficient documentation

## 2013-12-06 DIAGNOSIS — K4090 Unilateral inguinal hernia, without obstruction or gangrene, not specified as recurrent: Secondary | ICD-10-CM | POA: Insufficient documentation

## 2013-12-06 DIAGNOSIS — Z79899 Other long term (current) drug therapy: Secondary | ICD-10-CM | POA: Insufficient documentation

## 2013-12-06 HISTORY — PX: INSERTION OF MESH: SHX5868

## 2013-12-06 HISTORY — PX: INGUINAL HERNIA REPAIR: SHX194

## 2013-12-06 SURGERY — REPAIR, HERNIA, INGUINAL, ADULT
Anesthesia: General | Laterality: Right

## 2013-12-06 MED ORDER — CHLORHEXIDINE GLUCONATE 4 % EX LIQD: 1.0000 "application " | Freq: Once | CUTANEOUS | Status: DC

## 2013-12-06 MED ORDER — LIDOCAINE HCL (CARDIAC) 10 MG/ML IV SOLN
INTRAVENOUS | Status: DC | PRN
  Administered 2013-12-06: 25 mg via INTRAVENOUS

## 2013-12-06 MED ORDER — FENTANYL CITRATE 0.05 MG/ML IJ SOLN
50.0000 ug | INTRAMUSCULAR | Status: DC | PRN
  Administered 2013-12-06: 100 ug via INTRAVENOUS

## 2013-12-06 MED ORDER — 0.9 % SODIUM CHLORIDE (POUR BTL) OPTIME
TOPICAL | Status: DC | PRN
  Administered 2013-12-06: 200 mL

## 2013-12-06 MED ORDER — MIDAZOLAM HCL 2 MG/2ML IJ SOLN
INTRAMUSCULAR | Status: AC
  Filled 2013-12-06: qty 2

## 2013-12-06 MED ORDER — MEPERIDINE HCL 25 MG/ML IJ SOLN: 6.2500 mg | INTRAMUSCULAR | Status: DC | PRN

## 2013-12-06 MED ORDER — SUCCINYLCHOLINE CHLORIDE 20 MG/ML IJ SOLN
INTRAMUSCULAR | Status: DC | PRN
  Administered 2013-12-06: 100 mg via INTRAVENOUS

## 2013-12-06 MED ORDER — OXYCODONE HCL 5 MG PO TABS
ORAL_TABLET | ORAL | Status: AC
  Filled 2013-12-06: qty 1

## 2013-12-06 MED ORDER — BUPIVACAINE-EPINEPHRINE (PF) 0.25% -1:200000 IJ SOLN
INTRAMUSCULAR | Status: AC
  Filled 2013-12-06: qty 30

## 2013-12-06 MED ORDER — HYDROMORPHONE HCL PF 1 MG/ML IJ SOLN
0.2500 mg | INTRAMUSCULAR | Status: DC | PRN
  Administered 2013-12-06 (×2): 0.5 mg via INTRAVENOUS

## 2013-12-06 MED ORDER — MIDAZOLAM HCL 2 MG/2ML IJ SOLN: 0.5000 mg | Freq: Once | INTRAMUSCULAR | Status: DC | PRN

## 2013-12-06 MED ORDER — FENTANYL CITRATE 0.05 MG/ML IJ SOLN
INTRAMUSCULAR | Status: AC
  Filled 2013-12-06: qty 2

## 2013-12-06 MED ORDER — ONDANSETRON HCL 4 MG/2ML IJ SOLN
INTRAMUSCULAR | Status: DC | PRN
  Administered 2013-12-06: 4 mg via INTRAVENOUS

## 2013-12-06 MED ORDER — BUPIVACAINE-EPINEPHRINE (PF) 0.5% -1:200000 IJ SOLN
INTRAMUSCULAR | Status: DC | PRN
  Administered 2013-12-06: 30 mL

## 2013-12-06 MED ORDER — DEXAMETHASONE SODIUM PHOSPHATE 4 MG/ML IJ SOLN
INTRAMUSCULAR | Status: DC | PRN
  Administered 2013-12-06: 10 mg via INTRAVENOUS

## 2013-12-06 MED ORDER — CEFAZOLIN SODIUM-DEXTROSE 2-3 GM-% IV SOLR
2.0000 g | INTRAVENOUS | Status: AC
  Administered 2013-12-06: 2 g via INTRAVENOUS

## 2013-12-06 MED ORDER — LACTATED RINGERS IV SOLN
INTRAVENOUS | Status: DC
Start: 2013-12-06 — End: 2013-12-06
  Administered 2013-12-06 (×2): via INTRAVENOUS

## 2013-12-06 MED ORDER — FENTANYL CITRATE 0.05 MG/ML IJ SOLN
INTRAMUSCULAR | Status: DC | PRN
  Administered 2013-12-06: 100 ug via INTRAVENOUS

## 2013-12-06 MED ORDER — BUPIVACAINE-EPINEPHRINE 0.25% -1:200000 IJ SOLN
INTRAMUSCULAR | Status: DC | PRN
  Administered 2013-12-06: 6 mL

## 2013-12-06 MED ORDER — CEFAZOLIN SODIUM-DEXTROSE 2-3 GM-% IV SOLR
INTRAVENOUS | Status: AC
  Filled 2013-12-06: qty 50

## 2013-12-06 MED ORDER — OXYCODONE-ACETAMINOPHEN 5-325 MG PO TABS: 1.0000 | ORAL_TABLET | ORAL | Status: DC | PRN

## 2013-12-06 MED ORDER — PROPOFOL 10 MG/ML IV BOLUS
INTRAVENOUS | Status: DC | PRN
  Administered 2013-12-06: 150 mg via INTRAVENOUS

## 2013-12-06 MED ORDER — PROMETHAZINE HCL 25 MG/ML IJ SOLN: 6.2500 mg | INTRAMUSCULAR | Status: DC | PRN

## 2013-12-06 MED ORDER — OXYCODONE HCL 5 MG PO TABS
5.0000 mg | ORAL_TABLET | Freq: Once | ORAL | Status: AC | PRN
  Administered 2013-12-06: 5 mg via ORAL

## 2013-12-06 MED ORDER — HYDROMORPHONE HCL PF 1 MG/ML IJ SOLN
INTRAMUSCULAR | Status: AC
  Filled 2013-12-06: qty 1

## 2013-12-06 MED ORDER — MIDAZOLAM HCL 2 MG/2ML IJ SOLN
1.0000 mg | INTRAMUSCULAR | Status: DC | PRN
Start: 2013-12-06 — End: 2013-12-06
  Administered 2013-12-06: 2 mg via INTRAVENOUS

## 2013-12-06 MED ORDER — TRAMADOL HCL 50 MG PO TABS: 50.0000 mg | ORAL_TABLET | Freq: Four times a day (QID) | ORAL | Status: DC | PRN

## 2013-12-06 MED ORDER — OXYCODONE HCL 5 MG/5ML PO SOLN: 5.0000 mg | Freq: Once | ORAL | Status: AC | PRN

## 2013-12-06 MED ORDER — FENTANYL CITRATE 0.05 MG/ML IJ SOLN
INTRAMUSCULAR | Status: AC
  Filled 2013-12-06: qty 4

## 2013-12-06 SURGICAL SUPPLY — 52 items
BLADE 15 SAFETY STRL DISP (BLADE) ×4 IMPLANT
BLADE SURG ROTATE 9660 (MISCELLANEOUS) IMPLANT
CANISTER SUCT 1200ML W/VALVE (MISCELLANEOUS) ×4 IMPLANT
CHLORAPREP W/TINT 26ML (MISCELLANEOUS) ×4 IMPLANT
COVER MAYO STAND STRL (DRAPES) ×4 IMPLANT
COVER TABLE BACK 60X90 (DRAPES) ×4 IMPLANT
DECANTER SPIKE VIAL GLASS SM (MISCELLANEOUS) IMPLANT
DERMABOND ADVANCED (GAUZE/BANDAGES/DRESSINGS) ×2
DERMABOND ADVANCED .7 DNX12 (GAUZE/BANDAGES/DRESSINGS) ×2 IMPLANT
DRAIN PENROSE 1/2X12 LTX STRL (WOUND CARE) ×4 IMPLANT
DRAPE LAPAROTOMY TRNSV 102X78 (DRAPE) ×4 IMPLANT
DRAPE UTILITY XL STRL (DRAPES) ×4 IMPLANT
ELECT COATED BLADE 2.86 ST (ELECTRODE) ×4 IMPLANT
ELECT REM PT RETURN 9FT ADLT (ELECTROSURGICAL) ×4
ELECTRODE REM PT RTRN 9FT ADLT (ELECTROSURGICAL) ×2 IMPLANT
GAUZE SPONGE 4X4 16PLY XRAY LF (GAUZE/BANDAGES/DRESSINGS) IMPLANT
GLOVE BIO SURGEON STRL SZ 6.5 (GLOVE) ×3 IMPLANT
GLOVE BIO SURGEONS STRL SZ 6.5 (GLOVE) ×1
GLOVE BIOGEL PI IND STRL 6.5 (GLOVE) ×2 IMPLANT
GLOVE BIOGEL PI IND STRL 8 (GLOVE) ×2 IMPLANT
GLOVE BIOGEL PI INDICATOR 6.5 (GLOVE) ×2
GLOVE BIOGEL PI INDICATOR 8 (GLOVE) ×2
GLOVE ECLIPSE 8.0 STRL XLNG CF (GLOVE) ×4 IMPLANT
GLOVE EXAM NITRILE LRG STRL (GLOVE) ×4 IMPLANT
GOWN STRL REUS W/ TWL LRG LVL3 (GOWN DISPOSABLE) ×6 IMPLANT
GOWN STRL REUS W/TWL LRG LVL3 (GOWN DISPOSABLE) ×6
MESH HERNIA SYS ULTRAPRO LRG (Mesh General) ×4 IMPLANT
NEEDLE HYPO 25X1 1.5 SAFETY (NEEDLE) ×4 IMPLANT
NS IRRIG 1000ML POUR BTL (IV SOLUTION) ×4 IMPLANT
PACK BASIN DAY SURGERY FS (CUSTOM PROCEDURE TRAY) ×4 IMPLANT
PENCIL BUTTON HOLSTER BLD 10FT (ELECTRODE) ×4 IMPLANT
SLEEVE SCD COMPRESS KNEE MED (MISCELLANEOUS) ×4 IMPLANT
SPONGE GAUZE 4X4 12PLY STER LF (GAUZE/BANDAGES/DRESSINGS) IMPLANT
SPONGE LAP 4X18 X RAY DECT (DISPOSABLE) ×4 IMPLANT
STAPLER VISISTAT 35W (STAPLE) IMPLANT
SUT MON AB 4-0 PC3 18 (SUTURE) ×4 IMPLANT
SUT NOVA 0 T19/GS 22DT (SUTURE) ×8 IMPLANT
SUT VIC AB 0 SH 27 (SUTURE) ×4 IMPLANT
SUT VIC AB 2-0 SH 27 (SUTURE) ×2
SUT VIC AB 2-0 SH 27XBRD (SUTURE) ×2 IMPLANT
SUT VIC AB 3-0 54X BRD REEL (SUTURE) IMPLANT
SUT VIC AB 3-0 BRD 54 (SUTURE)
SUT VICRYL 3-0 CR8 SH (SUTURE) ×4 IMPLANT
SUT VICRYL AB 2 0 TIE (SUTURE) IMPLANT
SUT VICRYL AB 2 0 TIES (SUTURE)
SYR CONTROL 10ML LL (SYRINGE) ×4 IMPLANT
TAPE HYPAFIX 4 X10 (GAUZE/BANDAGES/DRESSINGS) IMPLANT
TOWEL OR 17X24 6PK STRL BLUE (TOWEL DISPOSABLE) ×4 IMPLANT
TOWEL OR NON WOVEN STRL DISP B (DISPOSABLE) ×4 IMPLANT
TUBE CONNECTING 20'X1/4 (TUBING) ×1
TUBE CONNECTING 20X1/4 (TUBING) ×3 IMPLANT
YANKAUER SUCT BULB TIP NO VENT (SUCTIONS) ×4 IMPLANT

## 2013-12-06 NOTE — Transfer of Care (Signed)
Immediate Anesthesia Transfer of Care Note  Patient: Wayne Vang  Procedure(s) Performed: Procedure(s):  REPAIR RIGHT INGUINAL HERNIA (Right) INSERTION OF MESH (N/A)  Patient Location: PACU  Anesthesia Type:GA combined with regional for post-op pain  Level of Consciousness: awake, sedated and patient cooperative  Airway & Oxygen Therapy: Patient Spontanous Breathing and Patient connected to face mask oxygen  Post-op Assessment: Report given to PACU RN and Post -op Vital signs reviewed and stable  Post vital signs: Reviewed and stable  Complications: No apparent anesthesia complications

## 2013-12-06 NOTE — Anesthesia Procedure Notes (Addendum)
Anesthesia Regional Block:  TAP block  Pre-Anesthetic Checklist: ,, timeout performed, Correct Patient, Correct Site, Correct Laterality, Correct Procedure, Correct Position, site marked, Risks and benefits discussed,  Surgical consent,  Pre-op evaluation,  At surgeon's request and post-op pain management  Laterality: Right  Prep: chloraprep       Needles:   Needle Type: Echogenic Needle      Needle Gauge: 22 and 22 G    Additional Needles:  Procedures: ultrasound guided (picture in chart) TAP block Narrative:  Start time: 12/06/2013 9:45 AM End time: 12/06/2013 9:49 AM Injection made incrementally with aspirations every 5 mL.  Performed by: Personally  Anesthesiologist: Jenita Seashore, MD  Additional Notes: Pt identified in Holding room.  Monitors applied. Working IV access confirmed. Sterile prep R flank.  #22ga Echogenic needle into TAP with US guidance, image in chart.  30cc 0.5% Bupivacaine with 1:200k epi injected incrementally after negative test dose, good TAP split.  Patient asymptomatic, VSS, no heme aspirated, tolerated well.  Jenita Seashore, MD   Procedure Name: Intubation Date/Time: 12/06/2013 10:28 AM Performed by: Lyndee Leo Pre-anesthesia Checklist: Patient identified, Emergency Drugs available, Suction available and Patient being monitored Patient Re-evaluated:Patient Re-evaluated prior to inductionOxygen Delivery Method: Circle System Utilized Preoxygenation: Pre-oxygenation with 100% oxygen Intubation Type: IV induction Ventilation: Mask ventilation without difficulty Laryngoscope Size: Mac and 4 Grade View: Grade II Tube type: Oral Tube size: 8.0 mm Number of attempts: 1 Airway Equipment and Method: stylet and oral airway Placement Confirmation: ETT inserted through vocal cords under direct vision,  positive ETCO2 and breath sounds checked- equal and bilateral Secured at: 22 cm Tube secured with: Tape Dental Injury: Teeth and Oropharynx as per  pre-operative assessment

## 2013-12-06 NOTE — Anesthesia Preprocedure Evaluation (Addendum)
Anesthesia Evaluation  Patient identified by MRN, date of birth, ID band Patient awake    Reviewed: Allergy & Precautions, H&P , NPO status , Patient's Chart, lab work & pertinent test results  History of Anesthesia Complications Negative for: history of anesthetic complications  Airway Mallampati: II TM Distance: >3 FB Neck ROM: Full    Dental  (+) Poor Dentition, Chipped, Missing, Dental Advisory Given   Pulmonary former smoker (quit '04),  breath sounds clear to auscultation  Pulmonary exam normal       Cardiovascular negative cardio ROS  Rate:Normal  '12 Stress test: no ischemia, normal LVF, EF 70%   Neuro/Psych Seizures - (last a few years ago), Well Controlled,     GI/Hepatic Neg liver ROS, GERD-  Medicated and Poorly Controlled,  Endo/Other  negative endocrine ROS  Renal/GU negative Renal ROS     Musculoskeletal   Abdominal   Peds  Hematology negative hematology ROS (+)   Anesthesia Other Findings   Reproductive/Obstetrics                        Anesthesia Physical Anesthesia Plan  ASA: III  Anesthesia Plan: General   Post-op Pain Management:    Induction: Intravenous  Airway Management Planned: Oral ETT  Additional Equipment:   Intra-op Plan:   Post-operative Plan: Extubation in OR  Informed Consent: I have reviewed the patients History and Physical, chart, labs and discussed the procedure including the risks, benefits and alternatives for the proposed anesthesia with the patient or authorized representative who has indicated his/her understanding and acceptance.   Dental advisory given  Plan Discussed with: CRNA and Surgeon  Anesthesia Plan Comments: (Plan routine monitors, GETA with TAP block for post op analgesia)       Anesthesia Quick Evaluation

## 2013-12-06 NOTE — Op Note (Signed)
Right Inguinal Hernia repair with ultrapro hernia system , Open, Procedure Note  Indications: The patient presented with a history of a right, reducible  Inguinal hernia. The risk of hernia repair include bleeding,  Infection,   Recurrence of the hernia,  Mesh use, chronic pain,  Organ injury,  Bowel injury,  Bladder injury,   nerve injury with numbness around the incision,  Death,  and worsening of preexisting  medical problems.  The alternatives to surgery have been discussed as well..  Long term expectations of both operative and non operative treatments have been discussed.   The patient agrees to proceed.   Pre-operative Diagnosis: right reducible inguinal hernia  Post-operative Diagnosis: same  Surgeon: Joyice Faster. Javayah Magaw   Assistants: none  Anesthesia: General endotracheal anesthesia and TEP block  ASA Class: 2  Procedure Details  The patient was seen again in the Holding Room. The risks, benefits, complications, treatment options, and expected outcomes were discussed with the patient. The possibilities of reaction to medication, pulmonary aspiration, perforation of viscus, bleeding, recurrent infection, the need for additional procedures, and development of a complication requiring transfusion or further operation were discussed with the patient and/or family. There was concurrence with the proposed plan, and informed consent was obtained. The site of surgery was properly noted/marked. The patient was taken to the Operating Room, identified as KOLBE DELMONACO, and the procedure verified as hernia repair. A Time Out was held and the above information confirmed.  The patient was placed in the supine position and underwent induction of anesthesia, the lower abdomen and groin was prepped and draped in the standard fashion, and 0.25% Marcaine with epinephrine was used to anesthetize the skin over the mid-portion of the inguinal canal. A transverse incision was made. Dissection was carried  through the soft tissue to expose the inguinal canal and inguinal ligament along its lower edge. The external oblique fascia was split along the course of its fibers, exposing the inguinal canal. The cord and nerve were looped using a Penrose drain and reflected out of the field. The defect was exposed and a piece of prolene hernia system ultrapro mesh was and placed into  The indirect  defect. Interupted 2-0 novafil  And 0 vicryl suture was then used  to repair the defect, with the suture being sewn from the pubic tubercle inferiorly and superiorly along the canal to a level just beyond the internal ring. There was adequate room for the cord structures to exit.  The mesh was split to allow passage of the cord and  into the canal without entrapment.  The ilioinguinal nerve was divided as it exited the muscle since the mesh was causing impingement. Iliohypogastric nerve was preserved.  The contents were then returned to canal and the external oblique fashion was then closed in a continuous fashion using 2-0 Vicryl suture taking care not to cause entrapment. Scarpa's layer closed with 3 0 vicryl and 4 0 monocryl used to close the skin.  Dermabond used for dressing.  Instrument, sponge, and needle counts were correct prior to closure and at the conclusion of the case.  Findings: Hernia as above  Estimated Blood Loss: Minimal         Drains: None         Total IV Fluids: 400 mL         Specimens: none               Complications: None; patient tolerated the procedure well.  Disposition: PACU - hemodynamically stable.         Condition: stable  

## 2013-12-06 NOTE — Progress Notes (Signed)
Assisted Dr. Jackson with right, ultrasound guided, transabdominal plane block. Side rails up, monitors on throughout procedure. See vital signs in flow sheet. Tolerated Procedure well. 

## 2013-12-06 NOTE — Discharge Instructions (Signed)
[facility] _______[facility] ° °UMBILICAL OR INGUINAL HERNIA REPAIR: POST OP INSTRUCTIONS ° °Always review your discharge instruction sheet given to you by the facility where your surgery was performed. °IF YOU HAVE DISABILITY OR FAMILY LEAVE FORMS, YOU MUST BRING THEM TO THE OFFICE FOR PROCESSING.   °DO NOT GIVE THEM TO YOUR DOCTOR. ° °1. A  prescription for pain medication may be given to you upon discharge.  Take your pain medication as prescribed, if needed.  If narcotic pain medicine is not needed, then you may take acetaminophen (Tylenol) or ibuprofen (Advil) as needed. °2. Take your usually prescribed medications unless otherwise directed. °3. If you need a refill on your pain medication, please contact your pharmacy.  They will contact our office to request authorization. Prescriptions will not be filled after 5 pm or on week-ends. °4. You should follow a light diet the first 24 hours after arrival home, such as soup and crackers, etc.  Be sure to include lots of fluids daily.  Resume your normal diet the day after surgery. °5. Most patients will experience some swelling and bruising around the umbilicus or in the groin and scrotum.  Ice packs and reclining will help.  Swelling and bruising can take several days to resolve.  °6. It is common to experience some constipation if taking pain medication after surgery.  Increasing fluid intake and taking a stool softener (such as Colace) will usually help or prevent this problem from occurring.  A mild laxative (Milk of Magnesia or Miralax) should be taken according to package directions if there are no bowel movements after 48 hours. °7. Unless discharge instructions indicate otherwise, you may remove your bandages 24-48 hours after surgery, and you may shower at that time.  You may have steri-strips (small skin tapes) in place directly over the incision.  These strips should be left on the skin for 7-10 days.  If your surgeon used skin glue on the  incision, you may shower in 24 hours.  The glue will flake off over the next 2-3 weeks.  Any sutures or staples will be removed at the office during your follow-up visit. °8. ACTIVITIES:  You may resume regular (light) daily activities beginning the next day--such as daily self-care, walking, climbing stairs--gradually increasing activities as tolerated.  You may have sexual intercourse when it is comfortable.  Refrain from any heavy lifting or straining until approved by your doctor. °a. You may drive when you are no longer taking prescription pain medication, you can comfortably wear a seatbelt, and you can safely maneuver your car and apply brakes. °b. RETURN TO WORK:  __________________________________________________________ °9. You should see your doctor in the office for a follow-up appointment approximately 2-3 weeks after your surgery.  Make sure that you call for this appointment within a day or two after you arrive home to insure a convenient appointment time. °10. OTHER INSTRUCTIONS:  __________________________________________________________________________________________________________________________________________________________________________________________  °WHEN TO CALL YOUR DOCTOR: °1. Fever over 101.0 °2. Inability to urinate °3. Nausea and/or vomiting °4. Extreme swelling or bruising °5. Continued bleeding from incision. °6. Increased pain, redness, or drainage from the incision ° °The clinic staff is available to answer your questions during regular business hours.  Please don’t hesitate to call and ask to speak to one of the nurses for clinical concerns.  If you have a medical emergency, go to the nearest emergency room or call 911.  A surgeon from [facility] is always on call at the hospital ° ° °  [address], [address], [place], [place]  [address] ? ° [address] [address], [place], [place]   [address] °[phone] ? [phone] ? [phone] [phone] °Web site:  [email or web address] ° ° ° °Post Anesthesia Home Care Instructions ° °Activity: °Get plenty of rest for the remainder of the day. A responsible adult should stay with you for 24 hours following the procedure.  °For the next 24 hours, DO NOT: °-Drive a car °-Operate machinery °-Drink alcoholic beverages °-Take any medication unless instructed by your physician °-Make any legal decisions or sign important papers. ° °Meals: °Start with liquid foods such as gelatin or soup. Progress to regular foods as tolerated. Avoid greasy, spicy, heavy foods. If nausea and/or vomiting occur, drink only clear liquids until the nausea and/or vomiting subsides. Call your physician if vomiting continues. ° °Special Instructions/Symptoms: °Your throat may feel dry or sore from the anesthesia or the breathing tube placed in your throat during surgery. If this causes discomfort, gargle with warm salt water. The discomfort should disappear within 24 hours. ° °

## 2013-12-06 NOTE — Interval H&P Note (Signed)
History and Physical Interval Note:  12/06/2013 9:53 AM  Wayne Vang  has presented today for surgery, with the diagnosis of right inguinal hernia  The various methods of treatment have been discussed with the patient and family. After consideration of risks, benefits and other options for treatment, the patient has consented to  Procedure(s):  REPAIR RIGHT INGUINAL HERNIA (Right) INSERTION OF MESH (N/A) as a surgical intervention .  The patient's history has been reviewed, patient examined, no change in status, stable for surgery.  I have reviewed the patient's chart and labs.  Questions were answered to the patient's satisfaction.     Khloi Rawl A. Julieanne Hadsall

## 2013-12-06 NOTE — Anesthesia Postprocedure Evaluation (Signed)
  Anesthesia Post-op Note  Patient: Wayne Vang  Procedure(s) Performed: Procedure(s):  REPAIR RIGHT INGUINAL HERNIA (Right) INSERTION OF MESH (N/A)  Patient Location: PACU  Anesthesia Type:GA combined with regional for post-op pain  Level of Consciousness: awake, alert , oriented and patient cooperative  Airway and Oxygen Therapy: Patient Spontanous Breathing  Post-op Pain: 3 /10, mild  Post-op Assessment: Post-op Vital signs reviewed, Patient's Cardiovascular Status Stable, Respiratory Function Stable, Patent Airway, No signs of Nausea or vomiting and Pain level controlled  Post-op Vital Signs: Reviewed and stable  Last Vitals:  Filed Vitals:   12/06/13 1315  BP: 146/91  Pulse: 71  Temp: 36.5 C  Resp: 16    Complications: No apparent anesthesia complications

## 2013-12-06 NOTE — H&P (View-Only) (Signed)
Patient ID: Wayne Vang, male   DOB: 11-20-1960, 53 y.o.   MRN: [id number]  Chief Complaint  Patient presents with  . Umbilical Hernia    HPI Wayne Vang is a 53 y.o. male.  Patient sent at the request of Dr. Edilia Bo for right inguinal hernia. Patient felt a bulge in right groin 2 months ago. Has pain with exertion, lifting or pushing in his right groin. Pain can be sharp at times with exertion. No nausea or vomiting. A bulge is noted. HPI  Past Medical History  Diagnosis Date  . Other and unspecified hyperlipidemia   . Seizure disorder   . Esophageal reflux   . Dizziness   . Tremor   . Low back pain   . Hiatal hernia     History reviewed. No pertinent past surgical history.  Family History  Problem Relation Age of Onset  . Stroke Sister   . Alcohol abuse Brother   . Alzheimer's disease Mother   . Cancer Father     Social History History  Substance Use Topics  . Smoking status: Former Smoker    Quit date: 07/22/2011  . Smokeless tobacco: Never Used  . Alcohol Use: Yes     Comment: socially    Allergies  Allergen Reactions  . Esomeprazole Magnesium     REACTION: abdominal pain  . Simvastatin   . Alum Hydroxide-Mag Carbonate     Medication interaction only    Current Outpatient Prescriptions  Medication Sig Dispense Refill  . diazepam (VALIUM) 5 MG tablet Take 0.5-1 tablets (2.5-5 mg total) by mouth 3 (three) times daily as needed.  20 tablet  0  . lansoprazole (PREVACID) 30 MG capsule Take 30 mg by mouth daily.      Marland Kitchen levETIRAcetam (KEPPRA) 500 MG tablet TAKE 0.5 TABLETS (250 MG TOTAL) BY MOUTH EVERY 12 (TWELVE) HOURS.  30 tablet  6  . meclizine (ANTIVERT) 25 MG tablet TAKE AS DIRECTED  90 tablet  4  . phenytoin (DILANTIN) 100 MG ER capsule TAKE TWO IN THE MORNING AND THREE AT NIGHT  150 capsule  3   No current facility-administered medications for this visit.    Review of Systems Review of Systems  Constitutional: Negative for fever, chills and  unexpected weight change.  HENT: Negative for congestion, hearing loss, sore throat, trouble swallowing and voice change.   Eyes: Negative for visual disturbance.  Respiratory: Negative for cough and wheezing.   Cardiovascular: Negative for chest pain, palpitations and leg swelling.  Gastrointestinal: Positive for abdominal pain. Negative for nausea, vomiting, diarrhea, constipation, blood in stool, abdominal distention, anal bleeding and rectal pain.  Genitourinary: Negative for hematuria and difficulty urinating.  Musculoskeletal: Negative for arthralgias.  Skin: Negative for rash and wound.  Neurological: Negative for seizures, syncope, weakness and headaches.  Hematological: Negative for adenopathy. Does not bruise/bleed easily.  Psychiatric/Behavioral: Negative for confusion.    Blood pressure 126/74, pulse 72, temperature 97.6 F (36.4 C), height 6\' 3"  (1.905 m), weight 236 lb (107.049 kg).  Physical Exam Physical Exam  Constitutional: He appears well-developed and well-nourished.  HENT:  Head: Normocephalic and atraumatic.  Eyes: EOM are normal. Pupils are equal, round, and reactive to light. No scleral icterus.  Neck: Normal range of motion. Neck supple.  Cardiovascular: Normal rate and regular rhythm.   Pulmonary/Chest: Effort normal and breath sounds normal.  Abdominal: A hernia is present. Hernia confirmed positive in the right inguinal area.    Musculoskeletal: Normal range of motion.  Neurological: He is alert.  Skin: Skin is warm.  Psychiatric: He has a normal mood and affect. His behavior is normal. Judgment and thought content normal.    Data Reviewed None   Assessment    Right inguinal hernia symptomatic    Plan    Recommend repair RIH. The risk of hernia repair include bleeding,  Infection,   Recurrence of the hernia,  Mesh use, chronic pain,  Organ injury,  Bowel injury,  Bladder injury,   nerve injury with numbness around the incision,  Death,  and  worsening of preexisting  medical problems.  The alternatives to surgery have been discussed as well..  Long term expectations of both operative and non operative treatments have been discussed.   The patient agrees to proceed.       Juanluis Guastella A. Javarion Douty 11/08/2013, 12:24 PM

## 2013-12-07 ENCOUNTER — Ambulatory Visit: Payer: PRIVATE HEALTH INSURANCE | Admitting: Nurse Practitioner

## 2013-12-08 ENCOUNTER — Encounter (HOSPITAL_BASED_OUTPATIENT_CLINIC_OR_DEPARTMENT_OTHER): Payer: Self-pay | Admitting: Surgery

## 2013-12-21 ENCOUNTER — Other Ambulatory Visit: Payer: Self-pay | Admitting: Diagnostic Neuroimaging

## 2013-12-22 ENCOUNTER — Ambulatory Visit (INDEPENDENT_AMBULATORY_CARE_PROVIDER_SITE_OTHER): Payer: PRIVATE HEALTH INSURANCE | Admitting: Surgery

## 2013-12-22 ENCOUNTER — Encounter (INDEPENDENT_AMBULATORY_CARE_PROVIDER_SITE_OTHER): Payer: Self-pay | Admitting: Surgery

## 2013-12-22 VITALS — BP 116/78 | HR 75 | Temp 97.2°F | Ht 75.0 in | Wt 232.0 lb

## 2013-12-22 DIAGNOSIS — Z9889 Other specified postprocedural states: Secondary | ICD-10-CM

## 2013-12-22 NOTE — Patient Instructions (Signed)
15 lbs weight limit for 2 weeks then no limit

## 2013-12-22 NOTE — Progress Notes (Signed)
Pt returns today after  Right inguinal hernia repair.  Pain is well controlled.  Bowels are functioning.  Wound is clean.  On exam:  Incision is clean /dry/intact.  Area is soft without signs of hernia recurrence.  Impression:  Status repair of hernia RIH   Plan:  RTC PRN  Return to work in  2  Weeks.

## 2014-01-27 ENCOUNTER — Telehealth (INDEPENDENT_AMBULATORY_CARE_PROVIDER_SITE_OTHER): Payer: Self-pay

## 2014-01-27 NOTE — Telephone Encounter (Signed)
Message copied by Carlene Coria on Fri Jan 27, 2014  2:44 PM ------      Message from: Joya San      Created: Fri Jan 27, 2014  9:21 AM      Contact: [phone]       Tylek Boney wife called pt needs a note to return to work. Please call wife ------

## 2014-01-27 NOTE — Telephone Encounter (Signed)
Tried calling patient and wife back but no answer or VM to leave msg. Per Dr Josetta Huddle instructions on last office note, pt was to RTW on 01/05/14. Did pt go back to work then? When does he need note for?

## 2014-01-30 ENCOUNTER — Encounter (INDEPENDENT_AMBULATORY_CARE_PROVIDER_SITE_OTHER): Payer: Self-pay

## 2014-01-30 ENCOUNTER — Telehealth (INDEPENDENT_AMBULATORY_CARE_PROVIDER_SITE_OTHER): Payer: Self-pay

## 2014-01-30 NOTE — Telephone Encounter (Signed)
Patient calling into office requesting a RTW note for 01/31/14.  Dr. Brantley Stage okayed patient to Return on 01/31/14.  Patient to pick up RTW note at front desk.

## 2014-02-01 ENCOUNTER — Encounter (INDEPENDENT_AMBULATORY_CARE_PROVIDER_SITE_OTHER): Payer: Self-pay

## 2014-02-25 ENCOUNTER — Other Ambulatory Visit: Payer: Self-pay | Admitting: Diagnostic Neuroimaging

## 2014-02-26 ENCOUNTER — Other Ambulatory Visit (INDEPENDENT_AMBULATORY_CARE_PROVIDER_SITE_OTHER): Payer: Self-pay | Admitting: Surgery

## 2014-03-13 ENCOUNTER — Ambulatory Visit: Payer: PRIVATE HEALTH INSURANCE | Admitting: Nurse Practitioner

## 2014-03-15 ENCOUNTER — Ambulatory Visit (INDEPENDENT_AMBULATORY_CARE_PROVIDER_SITE_OTHER): Payer: PRIVATE HEALTH INSURANCE | Admitting: Nurse Practitioner

## 2014-03-15 ENCOUNTER — Encounter: Payer: Self-pay | Admitting: Nurse Practitioner

## 2014-03-15 VITALS — BP 132/83 | HR 69 | Wt 235.2 lb

## 2014-03-15 DIAGNOSIS — G40109 Localization-related (focal) (partial) symptomatic epilepsy and epileptic syndromes with simple partial seizures, not intractable, without status epilepticus: Secondary | ICD-10-CM

## 2014-03-15 MED ORDER — LEVETIRACETAM 500 MG PO TABS: ORAL_TABLET | ORAL | Status: DC

## 2014-03-15 MED ORDER — PHENYTOIN SODIUM EXTENDED 100 MG PO CAPS: ORAL_CAPSULE | ORAL | Status: DC

## 2014-03-15 MED ORDER — MECLIZINE HCL 25 MG PO TABS: ORAL_TABLET | ORAL | Status: DC

## 2014-03-15 NOTE — Progress Notes (Signed)
PATIENT: Wayne Vang DOB: [birth year]  REASON FOR VISIT: routine follow up for seizure disorder HISTORY FROM: patient  HISTORY OF PRESENT ILLNESS: UPDATE 04/15/14 (LL): since last visit patient has been doing well, reports no seizures. Tolerating Keppra and Dilantin without known side effects.  Still with intermittent episodes of vertigo, unchanged. No complaints today.  UPDATE 12/07/12: since last visit patient doing well. No more seizures. Last seizure was in 2006 in nighttime sleep. His has intermittent episodes of spinning sensation, vertigo, lasting for a few minutes of time. Meclizine seems to help.   PRIOR HPI (11/03/12, Dr. Erling Cruz): 53 year old right-handed white married male with a past history of recurrent dizziness beginning in 1985. EEG 08/09/1983 showed epileptiform activity bilaterally in the frontotemporal regions, more prominent on the right. His first generalized seizure was 08/04/1983 and the second occurred 09/07/1983. Wayne Vang has been on Dilantin or phenytoin medication since 09/07/1993. His last generalized major motor seizure was 08/13/2004. At that time Keppra was added to his regiment and he has not had a seizure since. Medications are phenytoin sodium capsules 100 mg 2 in the morning and 3 at night and levitercetam 500 mg one half tablet twice per day. He is also on protonix 40 mg once daily. He discontinued simvastatin because of muscle pain. 11/27/2009 he was at work and developed nausea without vomiting. He had to sit down. He felt as if he had to focus on the wall and be very still. He was seen at [facility] emergency room where a Chest x-ray was normal and subsequent stress cardiac test was normal. CBC, basic metabolic panel, CPK, and cardiac markers were normal. A phenytoin level was 15.4. He has not had recurrent symptoms. His last EEG 05/19/2010 was normal .His last MRI of the brain 08/21/2004 showed no significant abnormalities. There was a small benign CSF cyst noted in  the right medial temporal lobe.He denies macropsia, micropsia, strange odors or tastes. The patient has headaches behind his eyes. Over the last 4 months he has recurrent dizziness. His has tingling in his left arm. At one point he was on meclizine, but is no longer taking it. He feels dizzy like "I'm on a boat". Sitting down or lying down helps. There is a sensation of "nearly spinning".The symptoms last 30 minutes to an hour. They can occur every 2-3 days. He is treating them with Dramamine 25 mg per day. He describes a lightheaded sensation or pressure sensation in the eyes.  He denies headaches, no vision, slurred speech, change in hearing, or ringing in his ears.   REVIEW OF SYSTEMS: Full 14 system review of systems performed and notable only for vertigo.   ALLERGIES: Allergies  Allergen Reactions  . Esomeprazole Magnesium     REACTION: abdominal pain  . Simvastatin   . Alum Hydroxide-Mag Carbonate     Medication interaction only    HOME MEDICATIONS: Outpatient Prescriptions Prior to Visit  Medication Sig Dispense Refill  . lansoprazole (PREVACID) 30 MG capsule TAKE ONE CAPSULE BY MOUTH EVERY DAY  30 capsule  5  . diazepam (VALIUM) 5 MG tablet Take 0.5-1 tablets (2.5-5 mg total) by mouth 3 (three) times daily as needed.  20 tablet  0  . levETIRAcetam (KEPPRA) 500 MG tablet TAKE 1/2 TABLET EVERY 12 HOURS  30 tablet  0  . meclizine (ANTIVERT) 25 MG tablet TAKE AS DIRECTED  90 tablet  4  . oxyCODONE-acetaminophen (ROXICET) 5-325 MG per tablet Take 1-2 tablets by mouth every 4 (  four) hours as needed.  40 tablet  0  . phenytoin (DILANTIN) 100 MG ER capsule TAKE TWO CAPSULES BY MOUTH IN THE MORNING AND THREE CAPSULES AT NIGHT  150 capsule  2   No facility-administered medications prior to visit.    PHYSICAL EXAM Filed Vitals:   03/15/14 1039  BP: 132/83  Pulse: 69  Weight: 235 lb 3.2 oz (106.686 kg)   Body mass index is 29.4 kg/(m^2).  Generalized: Well developed, in no acute  distress  Head: normocephalic and atraumatic. Oropharynx benign  Neck: Supple, no carotid bruits  Cardiac: Regular rate rhythm, no murmur  Musculoskeletal: No deformity   Neurological examination  Mentation: Alert oriented to time, place, history taking. Follows all commands speech and language fluent Cranial nerve II-XII: Fundoscopic exam not done. Pupils were equal round reactive to light extraocular movements were full, visual field were full on confrontational test. Facial sensation and strength were normal. hearing was intact to finger rubbing bilaterally. Uvula tongue midline. head turning and shoulder shrug and were normal and symmetric.Tongue protrusion into cheek strength was normal. Motor: The motor testing reveals 5 over 5 strength of all 4 extremities. Good symmetric motor tone is noted throughout.  Sensory: Sensory testing is intact to soft touch on all 4 extremities. No evidence of extinction is noted.  Coordination: Cerebellar testing reveals good finger-nose-finger and heel-to-shin bilaterally.  Gait and station: Gait is normal. Tandem gait is normal. Romberg is negative. Reflexes: Deep tendon reflexes are symmetric and normal bilaterally.   DIAGNOSTIC DATA (LABS, IMAGING, TESTING) - I reviewed patient records, labs, notes, testing and imaging myself where available.  Lab Results  Component Value Date   WBC 6.6 12/02/2013   HGB 15.8 12/02/2013   HCT 43.8 12/02/2013   MCV 85.7 12/02/2013   PLT 154 12/02/2013      Component Value Date/Time   NA 143 12/02/2013 1445   NA 140 12/07/2012 1525   K 4.2 12/02/2013 1445   CL 104 12/02/2013 1445   CO2 24 12/02/2013 1445   GLUCOSE 86 12/02/2013 1445   GLUCOSE 79 12/07/2012 1525   BUN 13 12/02/2013 1445   BUN 9 12/07/2012 1525   CREATININE 0.86 12/02/2013 1445   CALCIUM 9.5 12/02/2013 1445   PROT 7.4 12/02/2013 1445   PROT 7.5 12/07/2012 1525   ALBUMIN 5.0 12/02/2013 1445   AST 22 12/02/2013 1445   ALT 19 12/02/2013 1445   ALKPHOS 90  12/02/2013 1445   BILITOT 0.6 12/02/2013 1445   GFRNONAA >90 12/02/2013 1445   GFRAA >90 12/02/2013 1445    11/12/12 MRI brain - mild chronic small vessel ischemic disease   ASSESSMENT: 53 y.o. year old male has a past medical history of Other and unspecified hyperlipidemia; Seizure disorder; Esophageal reflux; Dizziness; Tremor; and Low back pain here with seizure disorder. Vertigo is of unclear etiology, but suspect peripheral vestibulopathy.   PLAN: Will check labs and continue meds. Follow up in 1 year, sooner as needed.  Meds ordered this encounter  Medications  . phenytoin (DILANTIN) 100 MG ER capsule    Sig: TAKE TWO CAPSULES BY MOUTH IN THE MORNING AND THREE CAPSULES AT NIGHT    Dispense:  450 capsule    Refill:  3    Order Specific Question:  Supervising Provider    Answer:  Andrey Spearman R [[id number]]  . levETIRAcetam (KEPPRA) 500 MG tablet    Sig: TAKE 1/2 TABLET EVERY 12 HOURS    Dispense:  90 tablet  Refill:  3    Order Specific Question:  Supervising Provider    Answer:  Andrey Spearman R [[id number]]  . meclizine (ANTIVERT) 25 MG tablet    Sig: TAKE AS DIRECTED    Dispense:  90 tablet    Refill:  4    Order Specific Question:  Supervising Provider    Answer:  Penni Bombard [[id number]]   Return in about 1 year (around 03/16/2015) for epilepsy.  Rudi Rummage Marc Sivertsen, MSN, FNP-BC, A/GNP-C 03/15/2014, 11:21 AM [facility] [address] [place], [place] [address] [phone]  Note: This document was prepared with digital dictation and possible smart phrase technology. Any transcriptional errors that result from this process are unintentional.

## 2014-03-15 NOTE — Patient Instructions (Signed)
Continue Medications at current doses.  Follow up in 1 year, sooner as needed.

## 2014-03-20 NOTE — Progress Notes (Signed)
I reviewed note and agree with plan.   Penni Bombard, MD 8/45/3646, 8:03 PM Certified in Neurology, Neurophysiology and Neuroimaging  [facility] [address] [place], [place] [address] [phone]

## 2014-03-26 ENCOUNTER — Other Ambulatory Visit: Payer: Self-pay | Admitting: Family Medicine

## 2014-03-26 ENCOUNTER — Other Ambulatory Visit: Payer: Self-pay | Admitting: Diagnostic Neuroimaging

## 2014-04-22 ENCOUNTER — Other Ambulatory Visit: Payer: Self-pay | Admitting: Family Medicine

## 2014-05-24 ENCOUNTER — Ambulatory Visit: Payer: PRIVATE HEALTH INSURANCE | Admitting: Family Medicine

## 2014-06-07 ENCOUNTER — Encounter: Payer: Self-pay | Admitting: Diagnostic Neuroimaging

## 2014-06-19 ENCOUNTER — Ambulatory Visit (INDEPENDENT_AMBULATORY_CARE_PROVIDER_SITE_OTHER): Payer: Self-pay | Admitting: Surgery

## 2014-06-19 NOTE — H&P (Signed)
Wayne Vang 06/19/2014 10:42 AM Location: [facility] Patient #: [id number] DOB: 06/10/1961 Married / Language: Cleophus Vang / Race: White Male History of Present Illness [name] A. Aahil Fredin MD; 06/19/2014 12:53 PM) Patient words: LIH   pt presents with left inguinal hernia. Pt developed pain and swelling left groin 1 month ago. Had the right side fixed earlier this year. Left side swells and causes pain with standing and lifting. Improves and goes away when supine. Some occasional constipation. CT done [place] earlier this month showed LIH with fat contained within it. No vomiting. Bowels moving.  The patient is a 53 year old male   Other Problems Marjean Donna, [place]; 06/19/2014 10:43 AM) Back Pain Gastroesophageal Reflux Disease Hemorrhoids Inguinal Hernia Seizure Disorder  Past Surgical History Marjean Donna, CMA; 06/19/2014 10:43 AM) Open Inguinal Hernia Surgery Right.  Diagnostic Studies History Marjean Donna, CMA; 06/19/2014 10:43 AM) Colonoscopy never  Allergies (Sonya Bynum, CMA; 06/19/2014 10:44 AM) Simvastatin *ANTIHYPERLIPIDEMICS* Esomeprazole Magnesium *ULCER DRUGS*  Medication History (Sonya Bynum, CMA; 06/19/2014 10:44 AM) Lansoprazole (30MG  Capsule DR, Oral) Active. Meclizine HCl (25MG  Tablet, Oral) Active. Phenytoin Sodium Extended (100MG  Capsule, Oral) Active. LevETIRAcetam (500MG  Tablet, Oral) Active.  Social History Marjean Donna, [place]; 06/19/2014 10:43 AM) Alcohol use Occasional alcohol use. Caffeine use Coffee. Illicit drug use Remotely quit drug use. Tobacco use Former smoker.  Family History Marjean Donna, [place]; 06/19/2014 10:43 AM) Alcohol Abuse Brother. Heart Disease Father. Hypertension Father.     Review of Systems [name] CMA; 06/19/2014 10:43 AM) General Not Present- Appetite Loss, Chills, Fatigue, Fever, Night Sweats, Weight Gain and Weight Loss. Skin Not Present- Change in Wart/Mole, Dryness, Hives,  Jaundice, New Lesions, Non-Healing Wounds, Rash and Ulcer. HEENT Present- Wears glasses/contact lenses. Not Present- Earache, Hearing Loss, Hoarseness, Nose Bleed, Oral Ulcers, Ringing in the Ears, Seasonal Allergies, Sinus Pain, Sore Throat, Visual Disturbances and Yellow Eyes. Respiratory Present- Snoring. Not Present- Bloody sputum, Chronic Cough, Difficulty Breathing and Wheezing. Breast Not Present- Breast Mass, Breast Pain, Nipple Discharge and Skin Changes. Cardiovascular Present- Leg Cramps. Not Present- Chest Pain, Difficulty Breathing Lying Down, Palpitations, Rapid Heart Rate, Shortness of Breath and Swelling of Extremities. Gastrointestinal Present- Abdominal Pain and Change in Bowel Habits. Not Present- Bloating, Bloody Stool, Chronic diarrhea, Constipation, Difficulty Swallowing, Excessive gas, Gets full quickly at meals, Hemorrhoids, Indigestion, Nausea, Rectal Pain and Vomiting. Male Genitourinary Not Present- Blood in Urine, Change in Urinary Stream, Frequency, Impotence, Nocturia, Painful Urination, Urgency and Urine Leakage. Musculoskeletal Present- Back Pain. Not Present- Joint Pain, Joint Stiffness, Muscle Pain, Muscle Weakness and Swelling of Extremities. Neurological Present- Headaches and Seizures. Not Present- Decreased Memory, Fainting, Numbness, Tingling, Tremor, Trouble walking and Weakness. Psychiatric Not Present- Anxiety, Bipolar, Change in Sleep Pattern, Depression, Fearful and Frequent crying. Endocrine Not Present- Cold Intolerance, Excessive Hunger, Hair Changes, Heat Intolerance, Hot flashes and New Diabetes. Hematology Not Present- Easy Bruising, Excessive bleeding, Gland problems, HIV and Persistent Infections.  Vitals (Sonya Bynum CMA; 06/19/2014 10:43 AM) 06/19/2014 10:43 AM Weight: 238 lb Height: 75in Body Surface Area: 2.39 m Body Mass Index: 29.75 kg/m Temp.: 28F(Temporal)  Pulse: 78 (Regular)  BP: 130/76 (Sitting, Left Arm,  Standard)     Physical Exam (Demetris Meinhardt A. Rilynn Habel MD; 06/19/2014 12:54 PM)  General Mental Status-Alert. General Appearance-Consistent with stated age. Hydration-Well hydrated. Voice-Normal.  Head and Neck Head-normocephalic, atraumatic with no lesions or palpable masses. Trachea-midline.  Eye Eyeball - Bilateral-Extraocular movements intact. Sclera/Conjunctiva - Bilateral-No scleral icterus.  Chest and Lung Exam Chest and  lung exam reveals -quiet, even and easy respiratory effort with no use of accessory muscles and on auscultation, normal breath sounds, no adventitious sounds and normal vocal resonance. Inspection Chest Wall - Normal. Back - normal.  Cardiovascular Cardiovascular examination reveals -normal heart sounds, regular rate and rhythm with no murmurs and normal pedal pulses bilaterally.  Abdomen Inspection Hernias - Inguinal hernia - Left - Reducible. Right - Note: scar without hernia.  Musculoskeletal Normal Exam - Left-Upper Extremity Strength Normal and Lower Extremity Strength Normal. Normal Exam - Right-Upper Extremity Strength Normal, Lower Extremity Weakness.    Assessment & Plan (Melyssa Signor A. Sargun Rummell MD; 06/19/2014 12:54 PM)  UNILATERAL INGUINAL HERNIA WITHOUT OBSTRUCTION OR GANGRENE, RECURRENCE NOT SPECIFIED (550.90  K40.90) Impression: left inguinal hernia reducible. recommend repair of left inguinal hernia with mesh. The risk of hernia repair include bleeding, infection, organ injury, bowel injury, bladder injury, nerve injury recurrent hernia, blood clots, worsening of underlying condition, chronic pain, mesh use, open surgery, death, and the need for other operattions. Pt agrees to proceed  Current Plans Pt Education - CCS Umbilical/ Inguinal Hernia HCI CCS Consent - BASIC (Gross): discussed with patient and provided information.

## 2014-10-22 ENCOUNTER — Other Ambulatory Visit: Payer: Self-pay | Admitting: Family Medicine

## 2014-11-28 ENCOUNTER — Other Ambulatory Visit: Payer: Self-pay | Admitting: Family Medicine

## 2014-11-28 NOTE — Telephone Encounter (Signed)
Ok to refill x 6 months  F/u CPX in the fall

## 2014-11-28 NOTE — Telephone Encounter (Signed)
Last office visit 11/02/2013.  Last refill noted Needs Appointment.  No future appointments scheduled.  Refill?

## 2014-11-28 NOTE — Telephone Encounter (Signed)
Please call and schedule CPE with fasting labs prior for sometime in the fall with Dr. Lorelei Pont.

## 2014-12-12 ENCOUNTER — Encounter: Payer: Self-pay | Admitting: Primary Care

## 2014-12-12 ENCOUNTER — Ambulatory Visit (INDEPENDENT_AMBULATORY_CARE_PROVIDER_SITE_OTHER): Payer: PRIVATE HEALTH INSURANCE | Admitting: Primary Care

## 2014-12-12 VITALS — BP 132/76 | HR 107 | Temp 98.2°F | Ht 75.0 in | Wt 241.1 lb

## 2014-12-12 DIAGNOSIS — H6123 Impacted cerumen, bilateral: Secondary | ICD-10-CM | POA: Diagnosis not present

## 2014-12-12 NOTE — Patient Instructions (Signed)
Your ears have been irrigated and there is no sign of infection to your ear drums. Try Debrox Drops over the counter for ear wax buildup. It was nice meeting you!  Cerumen Impaction A cerumen impaction is when the wax in your ear forms a plug. This plug usually causes reduced hearing. Sometimes it also causes an earache or dizziness. Removing a cerumen impaction can be difficult and painful. The wax sticks to the ear canal. The canal is sensitive and bleeds easily. If you try to remove a heavy wax buildup with a cotton tipped swab, you may push it in further. Irrigation with water, suction, and small ear curettes may be used to clear out the wax. If the impaction is fixed to the skin in the ear canal, ear drops may be needed for a few days to loosen the wax. People who build up a lot of wax frequently can use ear wax removal products available in your local drugstore. SEEK MEDICAL CARE IF:  You develop an earache, increased hearing loss, or marked dizziness. Document Released: 08/14/2004 Document Revised: 09/29/2011 Document Reviewed: 10/04/2009 [facility] Patient Information 2015 [place], [place]. This information is not intended to replace advice given to you by your health care provider. Make sure you discuss any questions you have with your health care provider.

## 2014-12-12 NOTE — Progress Notes (Signed)
Pre visit review using our clinic review tool, if applicable. No additional management support is needed unless otherwise documented below in the visit note. 

## 2014-12-12 NOTE — Progress Notes (Signed)
Subjective:    Patient ID: Wayne Vang, male    DOB: [birth year], 54 y.o.   MRN: [id number]  HPI  Wayne Vang is a 54 year old male who presents today with a chief complaint of ear fullness. His ear fullness is located to the left ear and has been present for 2 months. He reports feeling something "sticky" inside, especially when swallowing. He has a history of vertigo and reports more frequent spells. Denies right ear fullness. He's had a history of wax buildup and has had is ears irrigated twice over the ears. He has not used any product OTC such as Debrox.  Review of Systems  Constitutional: Negative for fever and chills.  HENT: Negative for congestion, ear pain, rhinorrhea, sinus pressure and sore throat.   Respiratory: Negative for cough and shortness of breath.   Cardiovascular: Negative for chest pain.  Allergic/Immunologic: Positive for environmental allergies.  Neurological: Positive for dizziness.       Past Medical History  Diagnosis Date  . Other and unspecified hyperlipidemia   . Seizure disorder   . Esophageal reflux   . Dizziness   . Tremor   . Low back pain   . Hiatal hernia   . Seizures     last at least before 2010    History   Social History  . Marital Status: Married    Spouse Name: N/A  . Number of Children: 1  . Years of Education: N/A   Occupational History  . Shop [employer]   Social History Main Topics  . Smoking status: Former Smoker    Quit date: 07/21/2002  . Smokeless tobacco: Never Used  . Alcohol Use: Yes     Comment: socially  . Drug Use: No  . Sexual Activity: Not on file   Other Topics Concern  . Not on file   Social History Narrative      He has been married 31+ years.   Caffeine Use: He drinks coke, tea, and coffee.    Past Surgical History  Procedure Laterality Date  . No past surgeries    . Inguinal hernia repair Right 12/06/2013    Procedure:  REPAIR RIGHT INGUINAL HERNIA;  Surgeon: Joyice Faster. Cornett, MD;  Location: [place];  Service: General;  Laterality: Right;  . Insertion of mesh N/A 12/06/2013    Procedure: INSERTION OF MESH;  Surgeon: Joyice Faster. Cornett, MD;  Location: [place];  Service: General;  Laterality: N/A;    Family History  Problem Relation Age of Onset  . Stroke Sister   . Alcohol abuse Brother   . Alzheimer's disease Mother   . Cancer Father     Allergies  Allergen Reactions  . Esomeprazole Magnesium     REACTION: abdominal pain  . Simvastatin   . Alum Hydroxide-Mag Carbonate     Medication interaction only    Current Outpatient Prescriptions on File Prior to Visit  Medication Sig Dispense Refill  . lansoprazole (PREVACID) 30 MG capsule TAKE ONE CAPSULE BY MOUTH EVERY DAY 30 capsule 5  . levETIRAcetam (KEPPRA) 500 MG tablet TAKE 1/2 TABLET EVERY 12 HOURS 90 tablet 3  . levETIRAcetam (KEPPRA) 500 MG tablet TAKE 1/2 OF A TABLET BY MOUTH EVERY 12 HOURS 90 tablet 3  . meclizine (ANTIVERT) 25 MG tablet TAKE AS DIRECTED 90 tablet 4  . phenytoin (DILANTIN) 100 MG ER capsule TAKE TWO CAPSULES BY MOUTH IN THE MORNING  AND THREE CAPSULES AT NIGHT 450 capsule 3   No current facility-administered medications on file prior to visit.    BP 132/76 mmHg  Pulse 107  Temp(Src) 98.2 F (36.8 C) (Oral)  Ht 6\' 3"  (1.905 m)  Wt 241 lb 1.9 oz (109.371 kg)  BMI 30.14 kg/m2  SpO2 96%    Objective:   Physical Exam  HENT:  Right Ear: Tympanic membrane and ear canal normal.  Left Ear: Tympanic membrane and ear canal normal.  Nose: Nose normal.  Mouth/Throat: Oropharynx is clear and moist.  Complete impaction with cerumen bilaterally. Attempted to remove with plastic curette with some removal. Irrigation completed bilaterally without difficulty. TM's are normal without bulging or erythema.  Eyes: Conjunctivae are normal. Pupils are equal, round, and reactive to light.  Neck: Neck supple.  Cardiovascular: Normal rate and regular  rhythm.   Pulmonary/Chest: Effort normal and breath sounds normal.  Lymphadenopathy:    He has no cervical adenopathy.  Skin: Skin is warm and dry.          Assessment & Plan:  Cerumen impaction:  Present bilaterally. Attempted to clear with plastic curette, but could not clear much. Irrigation performed by CMA with clearance. TM's NL without erythema or bulging. Recommended debrox drops OTC for maintenance.

## 2015-03-16 ENCOUNTER — Ambulatory Visit: Payer: PRIVATE HEALTH INSURANCE | Admitting: Nurse Practitioner

## 2015-03-19 ENCOUNTER — Ambulatory Visit: Payer: PRIVATE HEALTH INSURANCE | Admitting: Diagnostic Neuroimaging

## 2015-03-25 ENCOUNTER — Other Ambulatory Visit: Payer: Self-pay | Admitting: Diagnostic Neuroimaging

## 2015-03-26 NOTE — Telephone Encounter (Signed)
Has appt scheduled.

## 2015-05-14 ENCOUNTER — Telehealth: Payer: Self-pay | Admitting: Diagnostic Neuroimaging

## 2015-05-14 MED ORDER — LEVETIRACETAM 500 MG PO TABS: ORAL_TABLET | ORAL | Status: DC

## 2015-05-14 MED ORDER — PHENYTOIN SODIUM EXTENDED 100 MG PO CAPS: ORAL_CAPSULE | ORAL | Status: DC

## 2015-05-14 NOTE — Telephone Encounter (Signed)
Patient's wife is calling to get a Rx  for levETIRAcetam (KEPPRA) 500 MG tablet called to [facility] on [address]. The patient does have an appointment scheduled. Thank you.

## 2015-05-14 NOTE — Telephone Encounter (Signed)
Rx has been sent.  Receipt confirmed by pharmacy.   

## 2015-05-22 ENCOUNTER — Other Ambulatory Visit: Payer: Self-pay | Admitting: Family Medicine

## 2015-05-22 NOTE — Telephone Encounter (Signed)
Please call and schedule CPE with fasting labs prior with Dr. Copland.  

## 2015-05-22 NOTE — Telephone Encounter (Signed)
Last office visit 12/12/2014 with Anda Kraft for ear wax.  Last office visit with PCP 10/24/2013.  Last CPE????  Ok to refill?

## 2015-05-22 NOTE — Telephone Encounter (Signed)
Left message asking pt to call office  °

## 2015-05-22 NOTE — Telephone Encounter (Signed)
Ok, 30, 3 refills  F/u cpx

## 2015-05-25 NOTE — Telephone Encounter (Signed)
Left message asking pt to call office  °

## 2015-06-04 ENCOUNTER — Encounter: Payer: Self-pay | Admitting: Diagnostic Neuroimaging

## 2015-06-04 ENCOUNTER — Ambulatory Visit (INDEPENDENT_AMBULATORY_CARE_PROVIDER_SITE_OTHER): Payer: PRIVATE HEALTH INSURANCE | Admitting: Diagnostic Neuroimaging

## 2015-06-04 VITALS — BP 131/79 | HR 102 | Ht 75.0 in | Wt 236.0 lb

## 2015-06-04 DIAGNOSIS — G40109 Localization-related (focal) (partial) symptomatic epilepsy and epileptic syndromes with simple partial seizures, not intractable, without status epilepticus: Secondary | ICD-10-CM | POA: Diagnosis not present

## 2015-06-04 NOTE — Patient Instructions (Signed)
Thank you for coming to see [place] at [facility]. I hope we have been able to provide you high quality care today.  You may receive a patient satisfaction survey over the next few weeks. We would appreciate your feedback and comments so that we may continue to improve ourselves and the health of our patients.  - reduce levetiracetam to $RemoveBeforeDE'250mg'[id number]$  at bedtime for 1-2 months, then stop. - continue phenytoin   ~~~~~~~~~~~~~~~~~~~~~~~~~~~~~~~~~~~~~~~~~~~~~~~~~~~~~~~~~~~~~~~~~  DR. Camille Dragan'S GUIDE TO HAPPY AND HEALTHY LIVING These are some of my general health and wellness recommendations. Some of them may apply to you better than others. Please use common sense as you try these suggestions and feel free to ask me any questions.   ACTIVITY/FITNESS Mental, social, emotional and physical stimulation are very important for brain and body health. Try learning a new activity (arts, music, language, sports, games).  Keep moving your body to the best of your abilities. You can do this at home, inside or outside, the park, community center, gym or anywhere you like. Consider a physical therapist or personal trainer to get started. Consider the app Sworkit. Fitness trackers such as smart-watches, smart-phones or Fitbits can help as well.   NUTRITION Eat more plants: colorful vegetables, nuts, seeds and berries.  Eat less sugar, salt, preservatives and processed foods.  Avoid toxins such as cigarettes and alcohol.  Drink water when you are thirsty. Warm water with a slice of lemon is an excellent morning drink to start the day.  Consider these websites for more information The Nutrition Source (https://www.henry-hernandez.biz/) Precision Nutrition (WindowBlog.ch)   RELAXATION Consider practicing mindfulness meditation or other relaxation techniques such as deep breathing, prayer, yoga, tai chi, massage. See website mindful.org or the apps  Headspace or Calm to help get started.   SLEEP Try to get at least 7-8+ hours sleep per day. Regular exercise and reduced caffeine will help you sleep better. Practice good sleep hygeine techniques. See website sleep.org for more information.   PLANNING Prepare estate planning, living will, healthcare POA documents. Sometimes this is best planned with the help of an attorney. Theconversationproject.org and agingwithdignity.org are excellent resources.

## 2015-06-04 NOTE — Progress Notes (Signed)
[facility]  PATIENT: Wayne Vang DOB: 02/03/1961  REFERRING CLINICIAN:  HISTORY FROM: patient  REASON FOR VISIT: follow up    HISTORICAL  CHIEF COMPLAINT:  Chief Complaint  Patient presents with  . Epilepsy    rm 7  . Follow-up    last visit 02/2014    HISTORY OF PRESENT ILLNESS:   UPDATE 06/04/15: Doing well. No seizures. No vertigo attacks. Tolerating meds.   UPDATE 03/15/14 (LL): since last visit patient has been doing well, reports no seizures. Tolerating Keppra and Dilantin without known side effects. Still with intermittent episodes of vertigo, unchanged. No complaints today.  UPDATE 12/07/12 ([facility]): since last visit patient doing well. No more seizures. Last seizure was in 2006 in nighttime sleep. His has intermittent episodes of spinning sensation, vertigo, lasting for a few minutes of time. Meclizine seems to help.   PRIOR HPI (11/03/12, Dr. Erling Cruz): 54 year old right-handed white married male with a past history of recurrent dizziness beginning in 1985. EEG 08/09/1983 showed epileptiform activity bilaterally in the frontotemporal regions, more prominent on the right. His first generalized seizure was 08/04/1983 and the second occurred 09/07/1983. Wayne Vang has been on Dilantin or phenytoin medication since 09/07/1993. His last generalized major motor seizure was 08/13/2004. At that time Keppra was added to his regiment and he has not had a seizure since. Medications are phenytoin sodium capsules 100 mg 2 in the morning and 3 at night and levitercetam 500 mg one half tablet twice per day. He is also on protonix 40 mg once daily. He discontinued simvastatin because of muscle pain. 11/27/2009 he was at work and developed nausea without vomiting. He had to sit down. He felt as if he had to focus on the wall and be very still. He was seen at [facility] emergency room where a Chest x-ray was normal and subsequent stress cardiac test was normal. CBC, basic metabolic  panel, CPK, and cardiac markers were normal. A phenytoin level was 15.4. He has not had recurrent symptoms. His last EEG 05/19/2010 was normal .His last MRI of the brain 08/21/2004 showed no significant abnormalities. There was a small benign CSF cyst noted in the right medial temporal lobe.He denies macropsia, micropsia, strange odors or tastes. The patient has headaches behind his eyes. Over the last 4 months he has recurrent dizziness. His has tingling in his left arm. At one point he was on meclizine, but is no longer taking it. He feels dizzy like "I'm on a boat". Sitting down or lying down helps. There is a sensation of "nearly spinning".The symptoms last 30 minutes to an hour. They can occur every 2-3 days. He is treating them with Dramamine 25 mg per day. He describes a lightheaded sensation or pressure sensation in the eyes.    REVIEW OF SYSTEMS: Full 14 system review of systems performed and notable only for joint pain back pain.  ALLERGIES: Allergies  Allergen Reactions  . Esomeprazole Magnesium     REACTION: abdominal pain  . Simvastatin   . Alum Hydroxide-Mag Carbonate     Medication interaction only    HOME MEDICATIONS: Outpatient Prescriptions Prior to Visit  Medication Sig Dispense Refill  . lansoprazole (PREVACID) 30 MG capsule TAKE ONE CAPSULE BY MOUTH EVERY DAY 30 capsule 3  . levETIRAcetam (KEPPRA) 500 MG tablet TAKE 1/2 OF A TABLET BY MOUTH EVERY 12 HOURS 30 tablet 0  . meclizine (ANTIVERT) 25 MG tablet TAKE AS DIRECTED 90 tablet 4  . phenytoin (DILANTIN) 100  MG ER capsule TAKE TWO CAPSULES BY MOUTH IN THE MORNING AND THREE CAPSULES AT NIGHT 150 capsule 0   No facility-administered medications prior to visit.    PAST MEDICAL HISTORY: Past Medical History  Diagnosis Date  . Other and unspecified hyperlipidemia   . Seizure disorder ([place])   . Esophageal reflux   . Dizziness   . Tremor   . Low back pain   . Hiatal hernia   . Seizures ([place])     last at least before  2010    PAST SURGICAL HISTORY: Past Surgical History  Procedure Laterality Date  . No past surgeries    . Inguinal hernia repair Right 12/06/2013    Procedure:  REPAIR RIGHT INGUINAL HERNIA;  Surgeon: Joyice Faster. Cornett, MD;  Location: [place];  Service: General;  Laterality: Right;  . Insertion of mesh N/A 12/06/2013    Procedure: INSERTION OF MESH;  Surgeon: Joyice Faster. Cornett, MD;  Location: [place];  Service: General;  Laterality: N/A;  . Inguinal hernia repair Left 2015    FAMILY HISTORY: Family History  Problem Relation Age of Onset  . Stroke Sister   . Alcohol abuse Brother   . Alzheimer's disease Mother   . Cancer Father     SOCIAL HISTORY:  Social History   Social History  . Marital Status: Married    Spouse Name: N/A  . Number of Children: 1  . Years of Education: N/A   Occupational History  . Shop [employer]   Social History Main Topics  . Smoking status: Former Smoker    Quit date: 07/21/2002  . Smokeless tobacco: Never Used  . Alcohol Use: Yes     Comment: socially  . Drug Use: No  . Sexual Activity: Not on file   Other Topics Concern  . Not on file   Social History Narrative      He has been married 31+ years.   Caffeine Use: He drinks coke, tea, and coffee.     PHYSICAL EXAM  GENERAL EXAM/CONSTITUTIONAL: Vitals:  Filed Vitals:   06/04/15 1505  BP: 131/79  Pulse: 102  Height: 6\' 3"  (1.905 m)  Weight: 236 lb (107.049 kg)     Body mass index is 29.5 kg/(m^2).  No exam data present  Patient is in no distress; well developed, nourished and groomed; neck is supple  CARDIOVASCULAR:  Examination of carotid arteries is normal; no carotid bruits  Regular rate and rhythm, no murmurs  Examination of peripheral vascular system by observation and palpation is normal  EYES:  Ophthalmoscopic exam of optic discs and posterior segments is normal; no papilledema or  hemorrhages  MUSCULOSKELETAL:  Gait, strength, tone, movements noted in Neurologic exam below  NEUROLOGIC: MENTAL STATUS:  No flowsheet data found.  awake, alert, oriented to person, place and time  recent and remote memory intact  normal attention and concentration  language fluent, comprehension intact, naming intact,   fund of knowledge appropriate  CRANIAL NERVE:   2nd - no papilledema on fundoscopic exam  2nd, 3rd, 4th, 6th - pupils equal and reactive to light, visual fields full to confrontation, extraocular muscles intact, no nystagmus  5th - facial sensation symmetric  7th - facial strength symmetric  8th - hearing intact  9th - palate elevates symmetrically, uvula midline  11th - shoulder shrug symmetric  12th - tongue protrusion midline  MOTOR:   normal bulk and tone, full strength in  the BUE, BLE  SENSORY:   normal and symmetric to light touch, pinprick, temperature, vibration and proprioception  COORDINATION:   finger-nose-finger, fine finger movements, heel-shin normal  REFLEXES:   deep tendon reflexes present and symmetric  GAIT/STATION:   narrow based gait; able to walk on toes, heels and tandem; romberg is negative    DIAGNOSTIC DATA (LABS, IMAGING, TESTING) - I reviewed patient records, labs, notes, testing and imaging myself where available.  Lab Results  Component Value Date   WBC 6.6 12/02/2013   HGB 15.8 12/02/2013   HCT 43.8 12/02/2013   MCV 85.7 12/02/2013   PLT 154 12/02/2013      Component Value Date/Time   NA 143 12/02/2013 1445   NA 140 12/07/2012 1525   K 4.2 12/02/2013 1445   CL 104 12/02/2013 1445   CO2 24 12/02/2013 1445   GLUCOSE 86 12/02/2013 1445   GLUCOSE 79 12/07/2012 1525   BUN 13 12/02/2013 1445   BUN 9 12/07/2012 1525   CREATININE 0.86 12/02/2013 1445   CALCIUM 9.5 12/02/2013 1445   PROT 7.4 12/02/2013 1445   PROT 7.5 12/07/2012 1525   ALBUMIN 5.0 12/02/2013 1445   ALBUMIN 5.4 12/07/2012 1525    AST 22 12/02/2013 1445   ALT 19 12/02/2013 1445   ALKPHOS 90 12/02/2013 1445   BILITOT 0.6 12/02/2013 1445   GFRNONAA >90 12/02/2013 1445   GFRAA >90 12/02/2013 1445   Lab Results  Component Value Date   CHOL 178 09/03/2010   HDL 61.10 09/03/2010   LDLCALC 94 09/03/2010   LDLDIRECT 194.1 04/27/2009   TRIG 116.0 09/03/2010   CHOLHDL 3 09/03/2010   No results found for: HGBA1C No results found for: VITAMINB12 Lab Results  Component Value Date   TSH 0.88 09/03/2010    11/12/12 MRI brain  - mild chronic small vessel ischemic disease     ASSESSMENT AND PLAN  54 y.o. year old male here with seizure disorder, stable. No seizure since 2006.   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    Penni Bombard, MD [id number], [id number] PM Certified in Neurology, Neurophysiology and Neuroimaging  [facility] [address], [place], [place] [address] [phone]

## 2015-06-21 ENCOUNTER — Other Ambulatory Visit: Payer: Self-pay | Admitting: Diagnostic Neuroimaging

## 2015-06-21 NOTE — Telephone Encounter (Signed)
Last OV note says: reduce levetiracetam to 250mg  at bedtime for 1-2 months, then stop.

## 2015-07-05 ENCOUNTER — Other Ambulatory Visit: Payer: Self-pay | Admitting: Diagnostic Neuroimaging

## 2015-08-18 ENCOUNTER — Telehealth: Payer: Self-pay | Admitting: Diagnostic Neuroimaging

## 2015-08-20 NOTE — Telephone Encounter (Signed)
Unable to reach patient re: refill request on Levetiracetam. Patient was to be weaned off by end of Jan 2017.

## 2015-08-23 ENCOUNTER — Encounter: Payer: Self-pay | Admitting: *Deleted

## 2015-08-23 ENCOUNTER — Other Ambulatory Visit: Payer: Self-pay | Admitting: Diagnostic Neuroimaging

## 2015-08-23 NOTE — Telephone Encounter (Signed)
LVM at house number, second VM, requesting call back re: patient's medication refill. Attempted to reach wife on her mobile, no answer, voice mailbox not set up. Will attempt to reach later today.

## 2015-08-23 NOTE — Telephone Encounter (Signed)
Wife called to check status of refill for Levetiracetam

## 2015-08-23 NOTE — Telephone Encounter (Signed)
Patient's wife is returning a call. She can be reached at [phone].

## 2015-08-23 NOTE — Telephone Encounter (Signed)
Spoke with Barnett Applebaum, patient's wife who stated her husband has been taking Levetiracetam 1/2 tablet once daily since Nov FU with Dr Leta Baptist. She stated he has done well, no seizures.  Informed her that per Dr Gladstone Lighter order, patient was ot take 1/2 tablet x 1-2 months then stop medication.  Advised he may stop medication but call for any seizure activity or concerns, needs. Reminded her of his FU in May 2017. She verbalized understanding, appreciation.

## 2015-08-23 NOTE — Telephone Encounter (Signed)
The correct number she can be reached at is [phone].

## 2015-09-14 ENCOUNTER — Other Ambulatory Visit: Payer: Self-pay | Admitting: Family Medicine

## 2015-09-26 ENCOUNTER — Other Ambulatory Visit: Payer: Self-pay | Admitting: Family Medicine

## 2015-09-26 NOTE — Telephone Encounter (Signed)
Last office visit 12/12/2014 with Gentry Fitz for cerumen impaction.  Last office visit with Dr. Lorelei Pont 11/13/2013.  No future appointments.  Ok to refill?

## 2015-12-03 ENCOUNTER — Ambulatory Visit: Payer: PRIVATE HEALTH INSURANCE | Admitting: Diagnostic Neuroimaging

## 2015-12-05 ENCOUNTER — Ambulatory Visit (INDEPENDENT_AMBULATORY_CARE_PROVIDER_SITE_OTHER): Payer: PRIVATE HEALTH INSURANCE | Admitting: Diagnostic Neuroimaging

## 2015-12-05 ENCOUNTER — Encounter: Payer: Self-pay | Admitting: Gastroenterology

## 2015-12-05 ENCOUNTER — Encounter: Payer: Self-pay | Admitting: Diagnostic Neuroimaging

## 2015-12-05 ENCOUNTER — Ambulatory Visit (INDEPENDENT_AMBULATORY_CARE_PROVIDER_SITE_OTHER): Payer: PRIVATE HEALTH INSURANCE | Admitting: Family Medicine

## 2015-12-05 ENCOUNTER — Encounter: Payer: Self-pay | Admitting: Family Medicine

## 2015-12-05 VITALS — BP 110/72 | HR 69 | Temp 98.4°F | Ht 75.0 in | Wt 225.0 lb

## 2015-12-05 VITALS — BP 103/71 | HR 74 | Ht 75.0 in | Wt 224.0 lb

## 2015-12-05 DIAGNOSIS — Z1211 Encounter for screening for malignant neoplasm of colon: Secondary | ICD-10-CM

## 2015-12-05 DIAGNOSIS — G40109 Localization-related (focal) (partial) symptomatic epilepsy and epileptic syndromes with simple partial seizures, not intractable, without status epilepticus: Secondary | ICD-10-CM

## 2015-12-05 DIAGNOSIS — R1084 Generalized abdominal pain: Secondary | ICD-10-CM | POA: Diagnosis not present

## 2015-12-05 MED ORDER — PHENYTOIN SODIUM EXTENDED 100 MG PO CAPS: ORAL_CAPSULE | ORAL | Status: DC

## 2015-12-05 NOTE — Progress Notes (Signed)
[facility]  PATIENT: Wayne Vang DOB: [birth year]  REFERRING CLINICIAN:  HISTORY FROM: patient  REASON FOR VISIT: follow up    HISTORICAL  CHIEF COMPLAINT:  Chief Complaint  Patient presents with  . Localization-related epilepsy    rm 7, " no seizure activity"  . Follow-up    6 month    HISTORY OF PRESENT ILLNESS:   UPDATE 12/05/15: Since last visit, doing about the same. No seizures. Now off levetiracetam. Continues on phenytoin 200 / 300.   UPDATE 06/04/15: Doing well. No seizures. No vertigo attacks. Tolerating meds.   UPDATE 03/15/14 (LL): since last visit patient has been doing well, reports no seizures. Tolerating Keppra and Dilantin without known side effects. Still with intermittent episodes of vertigo, unchanged. No complaints today.  UPDATE 12/07/12 ([facility]): since last visit patient doing well. No more seizures. Last seizure was in 2006 in nighttime sleep. His has intermittent episodes of spinning sensation, vertigo, lasting for a few minutes of time. Meclizine seems to help.   PRIOR HPI (11/03/12, Dr. Erling Cruz): 55 year old right-handed white married male with a past history of recurrent dizziness beginning in 1985. EEG 08/09/1983 showed epileptiform activity bilaterally in the frontotemporal regions, more prominent on the right. His first generalized seizure was 08/04/1983 and the second occurred 09/07/1983. Wayne Vang has been on Dilantin or phenytoin medication since 09/07/1993. His last generalized major motor seizure was 08/13/2004. At that time Keppra was added to his regiment and he has not had a seizure since. Medications are phenytoin sodium capsules 100 mg 2 in the morning and 3 at night and levitercetam 500 mg one half tablet twice per day. He is also on protonix 40 mg once daily. He discontinued simvastatin because of muscle pain. 11/27/2009 he was at work and developed nausea without vomiting. He had to sit down. He felt as if he had to focus on the wall and  be very still. He was seen at [facility] emergency room where a Chest x-ray was normal and subsequent stress cardiac test was normal. CBC, basic metabolic panel, CPK, and cardiac markers were normal. A phenytoin level was 15.4. He has not had recurrent symptoms. His last EEG 05/19/2010 was normal. His last MRI of the brain 08/21/2004 showed no significant abnormalities. 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 He denies macropsia, micropsia, strange odors or tastes. The patient has headaches behind his eyes. Over the last 4 months he has recurrent dizziness. His has tingling in his left arm. At one point he was on meclizine, but is no longer taking it. He feels dizzy like "I'm on a boat". Sitting down or lying down helps. There is a sensation of "nearly spinning".The symptoms last 30 minutes to an hour. They can occur every 2-3 days. He is treating them with Dramamine 25 mg per day. He describes a lightheaded sensation or pressure sensation in the eyes.    REVIEW OF SYSTEMS: Full 14 system review of systems performed and negative except for snoring.    ALLERGIES: Allergies  Allergen Reactions  . Esomeprazole Magnesium     REACTION: abdominal pain  . Simvastatin   . Alum Hydroxide-Mag Carbonate     Medication interaction only    HOME MEDICATIONS: Outpatient Prescriptions Prior to Visit  Medication Sig Dispense Refill  . lansoprazole (PREVACID) 30 MG capsule TAKE ONE CAPSULE BY MOUTH EVERY DAY 30 capsule 3  . phenytoin (DILANTIN) 100 MG ER capsule TAKE TWO CAPSULES BY  MOUTH IN THE MORNING AND THREE CAPSULES AT NIGHT 150 capsule 0   No facility-administered medications prior to visit.    PAST MEDICAL HISTORY: Past Medical History  Diagnosis Date  . Other and unspecified hyperlipidemia   . Seizure disorder ([place])   . Esophageal reflux   . Dizziness   . Tremor   . Low back pain   . Hiatal hernia   . Seizures ([place])     last at least before 2010      PAST SURGICAL HISTORY: Past Surgical History  Procedure Laterality Date  . No past surgeries    . Inguinal hernia repair Right 12/06/2013    Procedure:  REPAIR RIGHT INGUINAL HERNIA;  Surgeon: Joyice Faster. Cornett, MD;  Location: [facility];  Service: General;  Laterality: Right;  . Insertion of mesh N/A 12/06/2013    Procedure: INSERTION OF MESH;  Surgeon: Joyice Faster. Cornett, MD;  Location: [facility];  Service: General;  Laterality: N/A;  . Inguinal hernia repair Left 2015    FAMILY HISTORY: Family History  Problem Relation Age of Onset  . Stroke Sister   . Alcohol abuse Brother   . Alzheimer's disease Mother   . Cancer Father     SOCIAL HISTORY:  Social History   Social History  . Marital Status: Married    Spouse Name: N/A  . Number of Children: 1  . Years of Education: N/A   Occupational History  . Shop [employer]   Social History Main Topics  . Smoking status: Former Smoker    Quit date: 07/21/2002  . Smokeless tobacco: Never Used  . Alcohol Use: Yes     Comment: socially  . Drug Use: No  . Sexual Activity: Not on file   Other Topics Concern  . Not on file   Social History Narrative      He has been married 31+ years.   Caffeine Use: He drinks coke, tea, and coffee.     PHYSICAL EXAM  GENERAL EXAM/CONSTITUTIONAL: Vitals:  Filed Vitals:   12/05/15 1451  BP: 103/71  Pulse: 74  Height: 6\' 3"  (1.905 m)  Weight: 224 lb (101.606 kg)   Body mass index is 28 kg/(m^2). No exam data present  Patient is in no distress; well developed, nourished and groomed; neck is supple  CARDIOVASCULAR:  Examination of carotid arteries is normal; no carotid bruits  Regular rate and rhythm, no murmurs  Examination of peripheral vascular system by observation and palpation is normal  EYES:  Ophthalmoscopic exam of optic discs and posterior segments is normal; no papilledema or  hemorrhages  MUSCULOSKELETAL:  Gait, strength, tone, movements noted in Neurologic exam below  NEUROLOGIC: MENTAL STATUS:  No flowsheet data found.  awake, alert, oriented to person, place and time  recent and remote memory intact  normal attention and concentration  language fluent, comprehension intact, naming intact,   fund of knowledge appropriate  CRANIAL NERVE:   2nd - no papilledema on fundoscopic exam  2nd, 3rd, 4th, 6th - pupils equal and reactive to light, visual fields full to confrontation, extraocular muscles intact, no nystagmus  5th - facial sensation symmetric  7th - facial strength symmetric  8th - hearing intact  9th - palate elevates symmetrically, uvula midline  11th - shoulder shrug symmetric  12th - tongue protrusion midline  MOTOR:   normal bulk and tone, full strength in the BUE, BLE  SENSORY:   normal and  symmetric to light touch, temperature, vibration  COORDINATION:   finger-nose-finger, fine finger movements normal  REFLEXES:   deep tendon reflexes present and symmetric  GAIT/STATION:   narrow based gait; ABLE TO TANDEM; ROMBERG NEGATIVE    DIAGNOSTIC DATA (LABS, IMAGING, TESTING) - I reviewed patient records, labs, notes, testing and imaging myself where available.  Lab Results  Component Value Date   WBC 6.6 12/02/2013   HGB 15.8 12/02/2013   HCT 43.8 12/02/2013   MCV 85.7 12/02/2013   PLT 154 12/02/2013      Component Value Date/Time   NA 143 12/02/2013 1445   NA 140 12/07/2012 1525   K 4.2 12/02/2013 1445   CL 104 12/02/2013 1445   CO2 24 12/02/2013 1445   GLUCOSE 86 12/02/2013 1445   GLUCOSE 79 12/07/2012 1525   BUN 13 12/02/2013 1445   BUN 9 12/07/2012 1525   CREATININE 0.86 12/02/2013 1445   CALCIUM 9.5 12/02/2013 1445   PROT 7.4 12/02/2013 1445   PROT 7.5 12/07/2012 1525   ALBUMIN 5.0 12/02/2013 1445   ALBUMIN 5.4 12/07/2012 1525   AST 22 12/02/2013 1445   ALT 19 12/02/2013 1445   ALKPHOS 90  12/02/2013 1445   BILITOT 0.6 12/02/2013 1445   GFRNONAA >90 12/02/2013 1445   GFRAA >90 12/02/2013 1445   Lab Results  Component Value Date   CHOL 178 09/03/2010   HDL 61.10 09/03/2010   LDLCALC 94 09/03/2010   LDLDIRECT 194.1 04/27/2009   TRIG 116.0 09/03/2010   CHOLHDL 3 09/03/2010   No results found for: HGBA1C No results found for: VITAMINB12 Lab Results  Component Value Date   TSH 0.88 09/03/2010    11/12/12 MRI brain  - mild chronic small vessel ischemic disease     ASSESSMENT AND PLAN  55 y.o. year old male here with seizure disorder, stable. No seizures since 2006.   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 phenytoin (DILANTIN) 100 MG ER capsule    Sig: TAKE TWO CAPSULES BY MOUTH IN THE MORNING AND THREE CAPSULES AT NIGHT    Dispense:  450 capsule    Refill:  4   Return in about 1 year (around 12/04/2016).    Penni Bombard, MD 0000000, 0000000 PM Certified in Neurology, Neurophysiology and Neuroimaging  [facility] [address], [place], [place] [address] [phone]

## 2015-12-05 NOTE — Patient Instructions (Addendum)
REFERRALS TO SPECIALISTS, SPECIAL TESTS (MRI, CT, ULTRASOUNDS)  MARION or LINDA will help you. ASK CHECK-IN FOR HELP.  Imaging / Special Testing referrals sometimes can be done same day if EMERGENCY, but others can take 2 or 3 days to get an appointment. Starting in 2015, many of the new Medicare plans and Obamacare plans take much longer.   Specialist appointment times vary a great deal, based on their schedule / openings. -- Some specialists have very long wait times. (Example. Dermatology. Multiple months  for non-cancer)    GETTING TO [place]. Irregular bowel habits such as constipation and diarrhea can lead to many problems over time.  Having one soft bowel movement a day is the most important way to prevent further problems.  The anorectal canal is designed to handle stretching and feces to safely manage our ability to get rid of solid waste (feces, poop, stool) out of our body.  BUT, hard constipated stools can act like ripping concrete bricks and diarrhea can be a burning fire to this very sensitive area of our body, causing inflamed hemorrhoids, anal fissures, increasing risk is perirectal abscesses, abdominal pain/bloating, an making irritable bowel worse.     The goal: ONE SOFT BOWEL MOVEMENT A DAY!  To have soft, regular bowel movements:  . Drink at least 8 tall glasses of water a day.   . Take plenty of fiber.  Fiber is the undigested part of plant food that passes into the colon, acting s "natures broom" to encourage bowel motility and movement.  Fiber can absorb and hold large amounts of water. This results in a larger, bulkier stool, which is soft and easier to pass. Work gradually over several weeks up to 6 servings a day of fiber (25g a day even more if needed) in the form of: o Vegetables -- Root (potatoes, carrots, turnips), leafy green (lettuce, salad greens, celery, spinach), or cooked high residue (cabbage, broccoli, etc) o Fruit -- Fresh (unpeeled skin & pulp),  Dried (prunes, apricots, cherries, etc ),  or stewed ( applesauce)  o Whole grain breads, pasta, etc (whole wheat)  o Bran cereals  . Bulking Agents -- This type of water-retaining fiber generally is easily obtained each day by one of the following:  o Psyllium bran -- The psyllium plant is remarkable because its ground seeds can retain so much water. This product is available as Metamucil, Konsyl, Effersyllium, Per Diem Fiber, or the less expensive generic preparation in drug and health food stores. Although labeled a laxative, it really is not a laxative.  o Methylcellulose -- This is another fiber derived from wood which also retains water. It is available as Citrucel. o Polyethylene Glycol - and "artificial" fiber commonly called Miralax or Glycolax.  It is helpful for people with gassy or bloated feelings with regular fiber o Flax Seed - a less gassy fiber than psyllium . No reading or other relaxing activity while on the toilet. If bowel movements take longer than 5 minutes, you are too constipated . AVOID CONSTIPATION.  High fiber and water intake usually takes care of this.  Sometimes a laxative is needed to stimulate more frequent bowel movements, but  . Laxatives are not a good long-term solution as it can wear the colon out. o Osmotics (Milk of Magnesia, Fleets phosphosoda, Magnesium citrate, MiraLax, GoLytely) are safer than  o Stimulants (Senokot, Castor Oil, Dulcolax, Ex Lax)    o Do not take laxatives for more than 7days in a row. Marland Kitchen  IF SEVERELY CONSTIPATED, try a Bowel Retraining Program: o Do not use laxatives.  o Eat a diet high in roughage, such as bran cereals and leafy vegetables.  o Drink six (6) ounces of prune or apricot juice each morning.  o Eat two (2) large servings of stewed fruit each day.  o Take one (1) heaping tablespoon of a psyllium-based bulking agent twice a day. Use sugar-free sweetener when possible to avoid excessive calories.  o Eat a normal breakfast.   o Set aside 15 minutes after breakfast to sit on the toilet, but do not strain to have a bowel movement.  o If you do not have a bowel movement by the third day, use an enema and repeat the above steps.  . Controlling diarrhea o Switch to liquids and simpler foods for a few days to avoid stressing your intestines further. o Avoid dairy products (especially milk & ice cream) for a short time.  The intestines often can lose the ability to digest lactose when stressed. o Avoid foods that cause gassiness or bloating.  Typical foods include beans and other legumes, cabbage, broccoli, and dairy foods.  Every person has some sensitivity to other foods, so listen to our body and avoid those foods that trigger problems for you. o Adding fiber (Citrucel, Metamucil, psyllium, Miralax) gradually can help thicken stools by absorbing excess fluid and retrain the intestines to act more normally.  Slowly increase the dose over a few weeks.  Too much fiber too soon can backfire and cause cramping & bloating. o Probiotics (such as active yogurt, Align, etc) may help repopulate the intestines and colon with normal bacteria and calm down a sensitive digestive tract.  Most studies show it to be of mild help, though, and such products can be costly. o Medicines: - Bismuth subsalicylate (ex. Kayopectate, Pepto Bismol) every 30 minutes for up to 6 doses can help control diarrhea.  Avoid if pregnant. - Loperamide (Immodium) can slow down diarrhea.  Start with two tablets (4mg  total) first and then try one tablet every 6 hours.  Avoid if you are having fevers or severe pain.  If you are not better or start feeling worse, stop all medicines and call your doctor for advice -

## 2015-12-05 NOTE — Progress Notes (Signed)
Dr. Frederico Hamman T. Charm Stenner, MD, [facility] [place], [address] Phone: [phone] Fax: [phone]  12/05/2015  Patient: Wayne Vang, MRN: [id number], DOB: [birth year], 55 y.o.  Primary Physician:  Arvanitis Loffler, MD   Chief Complaint  Patient presents with  . Abdominal Pain    30 minutes after eating starts cramping   Subjective:   Wayne Vang is a 55 y.o. very pleasant male patient who presents with the following:  Few years, has had some stomach pains off and on for a few years. Now lately, thought that maybe it was milk. Wife bough lactose milk.   When going, will get some loose stools. In the mornings it will be some cereal and  A lot of fast food.   Nice gentleman who is on Dilantin for epilepsy and taking Prevacid daily presents with some abdominal pain and cramping after eating.  This has been going on for at least 2 or 3 years.  He doesn't have a history is significant for bilateral hernia repair done approximately 24 months ago.  He also gets some loose stools in the morning.  His diet is not that great and eats a lot of diet including McDonald[employer] and [employer].  His evening meal tends to be little bit better, and his wife does cook quite a bit of fruits and vegetables generally.  Past Medical History, Surgical History, Social History, Family History, Problem List, Medications, and Allergies have been reviewed and updated if relevant.  Patient Active Problem List   Diagnosis Date Noted  . Post-operative state 12/22/2013  . Nausea alone 01/24/2013  . 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 OTHER MALAISE AND FATIGUE 09/03/2010  . HYPERLIPIDEMIA 01/26/2009  . SEIZURES, HX OF (DR. LOVE) 07/27/2008  . GERD 04/21/2007    Past Medical History  Diagnosis Date  . Other and unspecified hyperlipidemia   . Seizure disorder ([place])   . Esophageal reflux   . Dizziness   . Tremor   . Low back pain     . Hiatal hernia   . Seizures ([place])     last at least before 2010    Past Surgical History  Procedure Laterality Date  . No past surgeries    . Inguinal hernia repair Right 12/06/2013    Procedure:  REPAIR RIGHT INGUINAL HERNIA;  Surgeon: Joyice Faster. Cornett, MD;  Location: [facility];  Service: General;  Laterality: Right;  . Insertion of mesh N/A 12/06/2013    Procedure: INSERTION OF MESH;  Surgeon: Joyice Faster. Cornett, MD;  Location: [facility];  Service: General;  Laterality: N/A;  . Inguinal hernia repair Left 2015    Social History   Social History  . Marital Status: Married    Spouse Name: N/A  . Number of Children: 1  . Years of Education: N/A   Occupational History  . Shop [employer]   Social History Main Topics  . Smoking status: Former Smoker    Quit date: 07/21/2002  . Smokeless tobacco: Never Used  . Alcohol Use: Yes     Comment: socially  . Drug Use: No  . Sexual Activity: Not on file   Other Topics Concern  . Not on file   Social History Narrative      He has been married 31+ years.   Caffeine Use: He drinks coke, tea, and coffee.    Family History  Problem Relation Age of Onset  . Stroke Sister   . Alcohol abuse Brother   . Alzheimer's disease Mother   . Cancer Father     Allergies  Allergen Reactions  . Esomeprazole Magnesium     REACTION: abdominal pain  . Simvastatin   . Alum Hydroxide-Mag Carbonate     Medication interaction only    Medication list reviewed and updated in full in [facility].   GEN: No acute illnesses, no fevers, chills. GI: No n/v/d, eating normally Pulm: No SOB Interactive and getting along well at home.  Otherwise, ROS is as per the HPI.  Objective:   BP 110/72 mmHg  Pulse 69  Temp(Src) 98.4 F (36.9 C) (Oral)  Ht 6\' 3"  (1.905 m)  Wt 225 lb (102.059 kg)  BMI 28.12 kg/m2  GEN: WDWN, NAD, Non-toxic, A & O x 3 HEENT: Atraumatic, Normocephalic.  Neck supple. No masses, No LAD. Ears and Nose: No external deformity. CV: RRR, No M/G/R. No JVD. No thrill. No extra heart sounds. PULM: CTA B, no wheezes, crackles, rhonchi. No retractions. No resp. distress. No accessory muscle use. EXTR: No c/c/e NEURO Normal gait.  PSYCH: Normally interactive. Conversant. Not depressed or anxious appearing.  Calm demeanor.   Laboratory and Imaging Data:  Assessment and Plan:   Generalized abdominal pain  Special screening for malignant neoplasms, colon - Plan: Ambulatory referral to Gastroenterology  hhe presents with to me looks mostly like a IBS-type picture.  Recommended basic IBS treatment including water, increase fiber and details included below.  Also given his age, recommended colonoscopy evaluation.  Follow-up: for routine CPX  Modified Medications   Modified Medication Previous Medication   PHENYTOIN (DILANTIN) 100 MG ER CAPSULE phenytoin (DILANTIN) 100 MG ER capsule      TAKE TWO CAPSULES BY MOUTH IN THE MORNING AND THREE CAPSULES AT NIGHT    TAKE TWO CAPSULES BY MOUTH IN THE MORNING AND THREE CAPSULES AT NIGHT   Orders Placed This Encounter  Procedures  . Ambulatory referral to Gastroenterology   Patient Instructions  REFERRALS TO SPECIALISTS, SPECIAL TESTS (MRI, CT, ULTRASOUNDS)  Wayne Vang or Wayne Vang will help you. ASK CHECK-IN FOR HELP.  Imaging / Special Testing referrals sometimes can be done same day if EMERGENCY, but others can take 2 or 3 days to get an appointment. Starting in 2015, many of the new Medicare plans and Obamacare plans take much longer.   Specialist appointment times vary a great deal, based on their schedule / openings. -- Some specialists have very long wait times. (Example. Dermatology. Multiple months  for non-cancer)    GETTING TO [place]. Irregular bowel habits such as constipation and diarrhea can lead to many problems over time.  Having one soft bowel movement a day is the most important  way to prevent further problems.  The anorectal canal is designed to handle stretching and feces to safely manage our ability to get rid of solid waste (feces, poop, stool) out of our body.  BUT, hard constipated stools can act like ripping concrete bricks and diarrhea can be a burning fire to this very sensitive area of our body, causing inflamed hemorrhoids, anal fissures, increasing risk is perirectal abscesses, abdominal pain/bloating, an making irritable bowel worse.     The goal: ONE SOFT BOWEL MOVEMENT A DAY!  To have soft, regular bowel movements:  . Drink at least 8 tall glasses of water a day.   . Take plenty of fiber.  Fiber is the  undigested part of plant food that passes into the colon, acting s "natures broom" to encourage bowel motility and movement.  Fiber can absorb and hold large amounts of water. This results in a larger, bulkier stool, which is soft and easier to pass. Work gradually over several weeks up to 6 servings a day of fiber (25g a day even more if needed) in the form of: o Vegetables -- Root (potatoes, carrots, turnips), leafy green (lettuce, salad greens, celery, spinach), or cooked high residue (cabbage, broccoli, etc) o Fruit -- Fresh (unpeeled skin & pulp), Dried (prunes, apricots, cherries, etc ),  or stewed ( applesauce)  o Whole grain breads, pasta, etc (whole wheat)  o Bran cereals  . Bulking Agents -- This type of water-retaining fiber generally is easily obtained each day by one of the following:  o Psyllium bran -- The psyllium plant is remarkable because its ground seeds can retain so much water. This product is available as Metamucil, Konsyl, Effersyllium, Per Diem Fiber, or the less expensive generic preparation in drug and health food stores. Although labeled a laxative, it really is not a laxative.  o Methylcellulose -- This is another fiber derived from wood which also retains water. It is available as Citrucel. o Polyethylene Glycol - and "artificial" fiber  commonly called Miralax or Glycolax.  It is helpful for people with gassy or bloated feelings with regular fiber o Flax Seed - a less gassy fiber than psyllium . No reading or other relaxing activity while on the toilet. If bowel movements take longer than 5 minutes, you are too constipated . AVOID CONSTIPATION.  High fiber and water intake usually takes care of this.  Sometimes a laxative is needed to stimulate more frequent bowel movements, but  . Laxatives are not a good long-term solution as it can wear the colon out. o Osmotics (Milk of Magnesia, Fleets phosphosoda, Magnesium citrate, MiraLax, GoLytely) are safer than  o Stimulants (Senokot, Castor Oil, Dulcolax, Ex Lax)    o Do not take laxatives for more than 7days in a row. .  IF SEVERELY CONSTIPATED, try a Bowel Retraining Program: o Do not use laxatives.  o Eat a diet high in roughage, such as bran cereals and leafy vegetables.  o Drink six (6) ounces of prune or apricot juice each morning.  o Eat two (2) large servings of stewed fruit each day.  o Take one (1) heaping tablespoon of a psyllium-based bulking agent twice a day. Use sugar-free sweetener when possible to avoid excessive calories.  o Eat a normal breakfast.  o Set aside 15 minutes after breakfast to sit on the toilet, but do not strain to have a bowel movement.  o If you do not have a bowel movement by the third day, use an enema and repeat the above steps.  . Controlling diarrhea o Switch to liquids and simpler foods for a few days to avoid stressing your intestines further. o Avoid dairy products (especially milk & ice cream) for a short time.  The intestines often can lose the ability to digest lactose when stressed. o Avoid foods that cause gassiness or bloating.  Typical foods include beans and other legumes, cabbage, broccoli, and dairy foods.  Every person has some sensitivity to other foods, so listen to our body and avoid those foods that trigger problems for  you. o Adding fiber (Citrucel, Metamucil, psyllium, Miralax) gradually can help thicken stools by absorbing excess fluid and retrain the intestines to act more normally.  Slowly increase the dose over a few weeks.  Too much fiber too soon can backfire and cause cramping & bloating. o Probiotics (such as active yogurt, Align, etc) may help repopulate the intestines and colon with normal bacteria and calm down a sensitive digestive tract.  Most studies show it to be of mild help, though, and such products can be costly. o Medicines: - Bismuth subsalicylate (ex. Kayopectate, Pepto Bismol) every 30 minutes for up to 6 doses can help control diarrhea.  Avoid if pregnant. - Loperamide (Immodium) can slow down diarrhea.  Start with two tablets (4mg  total) first and then try one tablet every 6 hours.  Avoid if you are having fevers or severe pain.  If you are not better or start feeling worse, stop all medicines and call your doctor for advice -       Signed,  Maud Deed. Lendell Gallick, MD   Patient's Medications  New Prescriptions   No medications on file  Previous Medications   LANSOPRAZOLE (PREVACID) 30 MG CAPSULE    TAKE ONE CAPSULE BY MOUTH EVERY DAY  Modified Medications   Modified Medication Previous Medication   PHENYTOIN (DILANTIN) 100 MG ER CAPSULE phenytoin (DILANTIN) 100 MG ER capsule      TAKE TWO CAPSULES BY MOUTH IN THE MORNING AND THREE CAPSULES AT NIGHT    TAKE TWO CAPSULES BY MOUTH IN THE MORNING AND THREE CAPSULES AT NIGHT  Discontinued Medications   LEVETIRACETAM (KEPPRA) 500 MG TABLET    One half tablet (250mg ) at bedtime for 1-2 months then stop medication   MECLIZINE (ANTIVERT) 25 MG TABLET    TAKE AS DIRECTED   PHENYTOIN (DILANTIN) 100 MG ER CAPSULE    TAKE TWO CAPSULES BY MOUTH IN THE MORNING AND THREE CAPSULES AT NIGHT

## 2015-12-05 NOTE — Patient Instructions (Signed)
Thank you for coming to see [place] at [facility]. I hope we have been able to provide you high quality care today.  You may receive a patient satisfaction survey over the next few weeks. We would appreciate your feedback and comments so that we may continue to improve ourselves and the health of our patients.  - continue phenytoin   ~~~~~~~~~~~~~~~~~~~~~~~~~~~~~~~~~~~~~~~~~~~~~~~~~~~~~~~~~~~~~~~~~  DR. Nasri Boakye'S GUIDE TO HAPPY AND HEALTHY LIVING These are some of my general health and wellness recommendations. Some of them may apply to you better than others. Please use common sense as you try these suggestions and feel free to ask me any questions.   ACTIVITY/FITNESS Mental, social, emotional and physical stimulation are very important for brain and body health. Try learning a new activity (arts, music, language, sports, games).  Keep moving your body to the best of your abilities. You can do this at home, inside or outside, the park, community center, gym or anywhere you like. Consider a physical therapist or personal trainer to get started. Consider the app Sworkit. Fitness trackers such as smart-watches, smart-phones or Fitbits can help as well.   NUTRITION Eat more plants: colorful vegetables, nuts, seeds and berries.  Eat less sugar, salt, preservatives and processed foods.  Avoid toxins such as cigarettes and alcohol.  Drink water when you are thirsty. Warm water with a slice of lemon is an excellent morning drink to start the day.  Consider these websites for more information The Nutrition Source (https://www.henry-hernandez.biz/) Precision Nutrition (WindowBlog.ch)   RELAXATION Consider practicing mindfulness meditation or other relaxation techniques such as deep breathing, prayer, yoga, tai chi, massage. See website mindful.org or the apps Headspace or Calm to help get started.   SLEEP Try to get at least  7-8+ hours sleep per day. Regular exercise and reduced caffeine will help you sleep better. Practice good sleep hygeine techniques. See website sleep.org for more information.   PLANNING Prepare estate planning, living will, healthcare POA documents. Sometimes this is best planned with the help of an attorney. Theconversationproject.org and agingwithdignity.org are excellent resources.

## 2015-12-06 ENCOUNTER — Telehealth: Payer: Self-pay | Admitting: *Deleted

## 2015-12-06 LAB — COMPREHENSIVE METABOLIC PANEL
A/G RATIO: 2.2 (ref 1.2–2.2)
ALT: 20 IU/L (ref 0–44)
AST: 24 IU/L (ref 0–40)
Albumin: 5 g/dL (ref 3.5–5.5)
Alkaline Phosphatase: 93 IU/L (ref 39–117)
BUN/Creatinine Ratio: 13 (ref 9–20)
BUN: 12 mg/dL (ref 6–24)
Bilirubin Total: 0.5 mg/dL (ref 0.0–1.2)
CALCIUM: 9.6 mg/dL (ref 8.7–10.2)
CHLORIDE: 102 mmol/L (ref 96–106)
CO2: 27 mmol/L (ref 18–29)
CREATININE: 0.91 mg/dL (ref 0.76–1.27)
GFR, EST AFRICAN AMERICAN: 110 mL/min/{1.73_m2} (ref >59)
GFR, EST NON AFRICAN AMERICAN: 95 mL/min/{1.73_m2} (ref >59)
GLUCOSE: 85 mg/dL (ref 65–99)
Globulin, Total: 2.3 g/dL (ref 1.5–4.5)
Potassium: 5 mmol/L (ref 3.5–5.2)
Sodium: 145 mmol/L — ABNORMAL HIGH (ref 134–144)
TOTAL PROTEIN: 7.3 g/dL (ref 6.0–8.5)

## 2015-12-06 LAB — CBC WITH DIFFERENTIAL/PLATELET
BASOS: 1 %
Basophils Absolute: 0 10*3/uL (ref 0.0–0.2)
EOS (ABSOLUTE): 0.3 10*3/uL (ref 0.0–0.4)
EOS: 6 %
Hematocrit: 43.4 % (ref 37.5–51.0)
Hemoglobin: 15.6 g/dL (ref 12.6–17.7)
IMMATURE GRANS (ABS): 0 10*3/uL (ref 0.0–0.1)
IMMATURE GRANULOCYTES: 0 %
LYMPHS: 31 %
Lymphocytes Absolute: 1.6 10*3/uL (ref 0.7–3.1)
MCH: 30.6 pg (ref 26.6–33.0)
MCHC: 35.9 g/dL — ABNORMAL HIGH (ref 31.5–35.7)
MCV: 85 fL (ref 79–97)
Monocytes Absolute: 0.4 10*3/uL (ref 0.1–0.9)
Monocytes: 7 %
NEUTROS PCT: 55 %
Neutrophils Absolute: 2.8 10*3/uL (ref 1.4–7.0)
PLATELETS: 185 10*3/uL (ref 150–379)
RBC: 5.1 x10E6/uL (ref 4.14–5.80)
RDW: 13.7 % (ref 12.3–15.4)
WBC: 5.1 10*3/uL (ref 3.4–10.8)

## 2015-12-06 NOTE — Telephone Encounter (Signed)
LVM informing patient his labs are okay. Left name, number for any questions.

## 2016-01-18 ENCOUNTER — Other Ambulatory Visit: Payer: Self-pay | Admitting: Family Medicine

## 2016-01-28 ENCOUNTER — Ambulatory Visit (AMBULATORY_SURGERY_CENTER): Payer: Self-pay | Admitting: *Deleted

## 2016-01-28 ENCOUNTER — Telehealth: Payer: Self-pay | Admitting: *Deleted

## 2016-01-28 VITALS — Ht 75.0 in | Wt 227.6 lb

## 2016-01-28 DIAGNOSIS — Z1211 Encounter for screening for malignant neoplasm of colon: Secondary | ICD-10-CM

## 2016-01-28 MED ORDER — BISACODYL 5 MG PO TBEC: DELAYED_RELEASE_TABLET | ORAL | Status: DC

## 2016-01-28 MED ORDER — POLYETHYLENE GLYCOL 3350 17 GM/SCOOP PO POWD: ORAL | Status: DC

## 2016-01-28 MED ORDER — NA SULFATE-K SULFATE-MG SULF 17.5-3.13-1.6 GM/177ML PO SOLN: 1.0000 | Freq: Once | ORAL | Status: DC

## 2016-01-28 NOTE — Telephone Encounter (Signed)
Spoke with pt and explained need to switch to Miralax.  Pharmacist, Bill, called back and stated it was contraindicated for his hx of seizures.  I changed him to MIralax and mailed new instructions to him

## 2016-01-28 NOTE — Progress Notes (Signed)
No egg or soy allergy  No anesthesia or intubation problems per pt  No diet medications taken  Pt states he hasn't had a seizure in over 10 years  I spoke with, Rush Landmark, pharmacist at [facility] (pt's pharmacy) who stated it is ok for pt to have Suprep

## 2016-02-01 ENCOUNTER — Encounter: Payer: Self-pay | Admitting: Gastroenterology

## 2016-02-06 ENCOUNTER — Encounter: Payer: Self-pay | Admitting: Gastroenterology

## 2016-02-08 ENCOUNTER — Encounter: Payer: Self-pay | Admitting: Gastroenterology

## 2016-02-08 ENCOUNTER — Ambulatory Visit (AMBULATORY_SURGERY_CENTER): Payer: PRIVATE HEALTH INSURANCE | Admitting: Gastroenterology

## 2016-02-08 VITALS — BP 116/72 | HR 72 | Temp 97.5°F | Resp 12 | Ht 75.0 in | Wt 227.0 lb

## 2016-02-08 DIAGNOSIS — Z1211 Encounter for screening for malignant neoplasm of colon: Secondary | ICD-10-CM | POA: Diagnosis present

## 2016-02-08 DIAGNOSIS — K621 Rectal polyp: Secondary | ICD-10-CM

## 2016-02-08 DIAGNOSIS — D127 Benign neoplasm of rectosigmoid junction: Secondary | ICD-10-CM

## 2016-02-08 DIAGNOSIS — D128 Benign neoplasm of rectum: Secondary | ICD-10-CM

## 2016-02-08 DIAGNOSIS — D124 Benign neoplasm of descending colon: Secondary | ICD-10-CM | POA: Diagnosis not present

## 2016-02-08 DIAGNOSIS — D123 Benign neoplasm of transverse colon: Secondary | ICD-10-CM | POA: Diagnosis not present

## 2016-02-08 MED ORDER — SIMETHICONE 40 MG/0.6ML PO SUSP
40.0000 mg | Freq: Once | ORAL | Status: AC
  Administered 2016-02-08: 40 mg via ORAL

## 2016-02-08 MED ORDER — SODIUM CHLORIDE 0.9 % IV SOLN: 500.0000 mL | INTRAVENOUS | Status: DC

## 2016-02-08 NOTE — Progress Notes (Signed)
Called to room to assist during endoscopic procedure.  Patient ID and intended procedure confirmed with present staff. Received instructions for my participation in the procedure from the performing physician.  

## 2016-02-08 NOTE — Progress Notes (Signed)
Pt. Has spent time on commode, no gas.  He has had mylicon.  He points to lower abdomen and says his discomfort is there.  He is walking around Nurses station to try to help air to move.

## 2016-02-08 NOTE — Op Note (Signed)
[place] Patient Name: Wayne Vang Procedure Date: 02/08/2016 2:04 PM MRN: [id number] Endoscopist: Mauri Pole , MD Age: 55 Referring MD:  Date of Birth: August 26, 1960 Gender: Male Account #: [id number] Procedure:                Colonoscopy Indications:              Screening for colorectal malignant neoplasm, This                            is the patient's first colonoscopy Medicines:                Monitored Anesthesia Care Procedure:                Pre-Anesthesia Assessment:                           - Prior to the procedure, a History and Physical                            was performed, and patient medications and                            allergies were reviewed. The patient's tolerance of                            previous anesthesia was also reviewed. The risks                            and benefits of the procedure and the sedation                            options and risks were discussed with the patient.                            All questions were answered, and informed consent                            was obtained. Prior Anticoagulants: The patient has                            taken no previous anticoagulant or antiplatelet                            agents. ASA Grade Assessment: II - A patient with                            mild systemic disease. After reviewing the risks                            and benefits, the patient was deemed in                            satisfactory condition to undergo the procedure.  After obtaining informed consent, the colonoscope                            was passed under direct vision. Throughout the                            procedure, the patient's blood pressure, pulse, and                            oxygen saturations were monitored continuously. The                            Model CF-HQ190L [id number]) scope was introduced                            through the anus and  advanced to the the terminal                            ileum, with identification of the appendiceal                            orifice and IC valve. The colonoscopy was performed                            without difficulty. The patient tolerated the                            procedure well. The quality of the bowel                            preparation was good. No anatomical landmarks were                            photographed due to software glitch in ProVation. Scope In: 8:45:42 AM Scope Out: 9:14:09 AM Total Procedure Duration: 0 hours 28 minutes 27 seconds  Findings:                 The perianal and digital rectal examinations were                            normal.                           Three semi-pedunculated polyps were found in the                            sigmoid colon, descending colon and hepatic                            flexure. The polyps were 9 to 12 mm in size. These                            polyps were removed with a hot snare. Resection and  retrieval were complete.                           Two sessile polyps were found in the rectum. The                            polyps were 3 to 4 mm in size. These polyps were                            removed with a cold biopsy forceps. Resection and                            retrieval were complete.                           Multiple small-mouthed diverticula were found in                            the sigmoid colon.                           Non-bleeding internal hemorrhoids were found during                            retroflexion. The hemorrhoids were small. Complications:            No immediate complications. Estimated Blood Loss:     Estimated blood loss: none. Impression:               - Three 9 to 12 mm polyps in the sigmoid colon, in                            the descending colon and at the hepatic flexure,                            removed with a hot snare. Resected and  retrieved.                           - Two 3 to 4 mm polyps in the rectum, removed with                            a cold biopsy forceps. Resected and retrieved.                           - Diverticulosis in the sigmoid colon.                           - Non-bleeding internal hemorrhoids. Recommendation:           - Patient has a contact number available for                            emergencies. The signs and symptoms of potential  delayed complications were discussed with the                            patient. Return to normal activities tomorrow.                            Written discharge instructions were provided to the                            patient.                           - Resume previous diet.                           - Continue present medications.                           - Await pathology results.                           - Repeat colonoscopy in 3 years for surveillance.                           - Return to GI clinic PRN. Mauri Pole, MD 02/08/2016 4:20:06 PM This report has been signed electronically.

## 2016-02-08 NOTE — Progress Notes (Signed)
Stable to RR 

## 2016-02-08 NOTE — Progress Notes (Signed)
Passing air but very uncomfortable.  Advised Dr. Silverio Decamp.  Give [id number] Mylicon infant gtts for gas.

## 2016-02-08 NOTE — Progress Notes (Signed)
After spending more time on commode, pt. Stated he had a small area in lower abdomen that he felt gas.  Discharged to home.

## 2016-02-08 NOTE — Patient Instructions (Signed)
YOU HAD AN ENDOSCOPIC PROCEDURE TODAY AT THE [facility]:   Refer to the procedure report that was given to you for any specific questions about what was found during the examination.  If the procedure report does not answer your questions, please call your gastroenterologist to clarify.  If you requested that your care partner not be given the details of your procedure findings, then the procedure report has been included in a sealed envelope for you to review at your convenience later.  YOU SHOULD EXPECT: Some feelings of bloating in the abdomen. Passage of more gas than usual.  Walking can help get rid of the air that was put into your GI tract during the procedure and reduce the bloating. If you had a lower endoscopy (such as a colonoscopy or flexible sigmoidoscopy) you may notice spotting of blood in your stool or on the toilet paper. If you underwent a bowel prep for your procedure, you may not have a normal bowel movement for a few days.  Please Note:  You might notice some irritation and congestion in your nose or some drainage.  This is from the oxygen used during your procedure.  There is no need for concern and it should clear up in a day or so.  SYMPTOMS TO REPORT IMMEDIATELY:   Following lower endoscopy (colonoscopy or flexible sigmoidoscopy):  Excessive amounts of blood in the stool  Significant tenderness or worsening of abdominal pains  Swelling of the abdomen that is new, acute  Fever of 100F or higher  For urgent or emergent issues, a gastroenterologist can be reached at any hour by calling [phone].   DIET: Your first meal following the procedure should be a small meal and then it is ok to progress to your normal diet. Heavy or fried foods are harder to digest and may make you feel nauseous or bloated.  Likewise, meals heavy in dairy and vegetables can increase bloating.  Drink plenty of fluids but you should avoid alcoholic beverages for 24  hours.  ACTIVITY:  You should plan to take it easy for the rest of today and you should NOT DRIVE or use heavy machinery until tomorrow (because of the sedation medicines used during the test).    FOLLOW UP: Our staff will call the number listed on your records the next business day following your procedure to check on you and address any questions or concerns that you may have regarding the information given to you following your procedure. If we do not reach you, we will leave a message.  However, if you are feeling well and you are not experiencing any problems, there is no need to return our call.  We will assume that you have returned to your regular daily activities without incident.  If any biopsies were taken you will be contacted by phone or by letter within the next 1-3 weeks.  Please call us at [phone] if you have not heard about the biopsies in 3 weeks.    SIGNATURES/CONFIDENTIALITY: You and/or your care partner have signed paperwork which will be entered into your electronic medical record.  These signatures attest to the fact that that the information above on your After Visit Summary has been reviewed and is understood.  Full responsibility of the confidentiality of this discharge information lies with you and/or your care-partner.   Polyp, diverticulosis, high fiber diet and hemorrhoid information given. 

## 2016-02-11 ENCOUNTER — Telehealth: Payer: Self-pay

## 2016-02-11 NOTE — Telephone Encounter (Signed)
   Patient was called for follow up after his procedure on 02/08/2016. No answer at the number given for follow up phone call. I was not able to leave a message due to mail box was not set up.

## 2016-02-15 ENCOUNTER — Encounter: Payer: Self-pay | Admitting: Gastroenterology

## 2016-03-20 ENCOUNTER — Other Ambulatory Visit: Payer: Self-pay | Admitting: *Deleted

## 2016-03-20 MED ORDER — LANSOPRAZOLE 30 MG PO CPDR: 30.0000 mg | DELAYED_RELEASE_CAPSULE | Freq: Every day | ORAL | 3 refills | Status: DC

## 2016-03-21 MED ORDER — LANSOPRAZOLE 30 MG PO CPDR: 30.0000 mg | DELAYED_RELEASE_CAPSULE | Freq: Every day | ORAL | 1 refills | Status: DC

## 2016-03-21 NOTE — Addendum Note (Signed)
Addended by: Carter Kitten on: 03/21/2016 02:29 PM   Modules accepted: Orders

## 2016-03-21 NOTE — Telephone Encounter (Signed)
Received fax from [facility] requesting 90 day supply.

## 2016-07-03 ENCOUNTER — Encounter: Payer: Self-pay | Admitting: Family Medicine

## 2016-07-03 ENCOUNTER — Ambulatory Visit (INDEPENDENT_AMBULATORY_CARE_PROVIDER_SITE_OTHER): Payer: PRIVATE HEALTH INSURANCE | Admitting: Family Medicine

## 2016-07-03 VITALS — BP 112/80 | HR 98 | Temp 98.4°F | Wt 244.0 lb

## 2016-07-03 DIAGNOSIS — J069 Acute upper respiratory infection, unspecified: Secondary | ICD-10-CM | POA: Diagnosis not present

## 2016-07-03 MED ORDER — AMOXICILLIN-POT CLAVULANATE 875-125 MG PO TABS: 1.0000 | ORAL_TABLET | Freq: Two times a day (BID) | ORAL | 0 refills | Status: DC

## 2016-07-03 NOTE — Progress Notes (Signed)
Pre visit review using our clinic review tool, if applicable. No additional management support is needed unless otherwise documented below in the visit note. 

## 2016-07-03 NOTE — Patient Instructions (Addendum)
For nasal congestion you can use Afrin nasal spray for 4 days max, Sudafed (generic is fine from behind pharmacy counter), saline nasal spray (generic is fine for all). For cough you can try Delsym. Drink enough fluids to make your urine light yellow. For fever/chill/muscle aches you can take over the counter acetaminophen or ibuprofen (2-3 over the counter every 8-12 hours).  If not better in 3-4 days, can have antibiotic filled.   Please come back in if you are not better in 5-7 days or if you develop wheezing, shortness of breath or persistent vomiting.

## 2016-07-03 NOTE — Progress Notes (Signed)
Subjective:    Patient ID: Wayne Vang, male    DOB: 14-Sep-1960, 55 y.o.   MRN: [id number]  HPI This is a 55 yo male who presents today with sore throat, cough, sinus pressure and chest congestion for 1 week. Started with stuffy nose and sore throat. Pain with swallowing. Feels sinus drainage down throat and this seems to trigger cough. Felt better and then started to feel worse with sinus pressure and pain. Worse in the morning. Has been taking Alka Seltzer Plus cold medicine at night.No fever. Feels fatigued, no aches. No wheezing or SOB. Takes Goodies orange powders once or twice a day.   Past Medical History:  Diagnosis Date  . Dizziness   . Esophageal reflux   . Hiatal hernia   . Low back pain   . Other and unspecified hyperlipidemia   . Seizure disorder ([place])   . Seizures ([place])    01-28-16 pt states he hasn't had a seizure in 10 years  . Tremor    Past Surgical History:  Procedure Laterality Date  . INGUINAL HERNIA REPAIR Right 12/06/2013   Procedure:  REPAIR RIGHT INGUINAL HERNIA;  Surgeon: Joyice Faster. Cornett, MD;  Location: [facility];  Service: General;  Laterality: Right;  . INGUINAL HERNIA REPAIR Left 2015  . INSERTION OF MESH N/A 12/06/2013   Procedure: INSERTION OF MESH;  Surgeon: Joyice Faster. Cornett, MD;  Location: [facility];  Service: General;  Laterality: N/A;  . NO PAST SURGERIES     Family History  Problem Relation Age of Onset  . Stroke Sister   . Alcohol abuse Brother   . Alzheimer's disease Mother   . Cancer Father   . Colon cancer Neg Hx   . Esophageal cancer Neg Hx   . Stomach cancer Neg Hx   . Rectal cancer Neg Hx    Social History  Substance Use Topics  . Smoking status: Former Smoker    Quit date: 07/21/2002  . Smokeless tobacco: Never Used  . Alcohol use Yes     Comment: socially      Review of Systems Per HPI    Objective:   Physical Exam  Constitutional: He is oriented to person, place, and time. He  appears well-developed and well-nourished. No distress.  HENT:  Head: Normocephalic and atraumatic.  Right Ear: Tympanic membrane, external ear and ear canal normal.  Left Ear: Tympanic membrane, external ear and ear canal normal.  Nose: Mucosal edema and rhinorrhea present. Right sinus exhibits no maxillary sinus tenderness and no frontal sinus tenderness. Left sinus exhibits no maxillary sinus tenderness and no frontal sinus tenderness.  Mouth/Throat: Uvula is midline and oropharynx is clear and moist.  Cardiovascular: Normal rate, regular rhythm and normal heart sounds.   Pulmonary/Chest: Effort normal and breath sounds normal.  Neurological: He is alert and oriented to person, place, and time.  Skin: Skin is warm and dry. He is not diaphoretic.  Psychiatric: He has a normal mood and affect. His behavior is normal. Judgment and thought content normal.  Vitals reviewed.     BP 112/80 (BP Location: Left Arm, Patient Position: Sitting, Cuff Size: Large)   Pulse 98   Temp 98.4 F (36.9 C) (Oral)   Wt 244 lb (110.7 kg)   SpO2 96%   BMI 30.50 kg/m  Wt Readings from Last 3 Encounters:  07/03/16 244 lb (110.7 kg)  02/08/16 227 lb (103 kg)  01/28/16 227 lb 9.6 oz (103.2 kg)  Assessment & Plan:  1. Acute upper respiratory infection - Will try symptomatic relief measures and if not improved in 3-4 days, can start wait and see antibiotic - advised him to monitor acetaminophen intake and avoid multiple products that contain acetaminophen Patient Instructions  For nasal congestion you can use Afrin nasal spray for 4 days max, Sudafed (generic is fine from behind pharmacy counter), saline nasal spray (generic is fine for all). For cough you can try Delsym. Drink enough fluids to make your urine light yellow. For fever/chill/muscle aches you can take over the counter acetaminophen or ibuprofen (2-3 over the counter every 8-12 hours).  If not better in 3-4 days, can have antibiotic  filled.   Please come back in if you are not better in 5-7 days or if you develop wheezing, shortness of breath or persistent vomiting.   - amoxicillin-clavulanate (AUGMENTIN) 875-125 MG tablet; Take 1 tablet by mouth 2 (two) times daily.  Dispense: 14 tablet; Refill: 0  Clarene Reamer, FNP-BC  [facility]  07/03/2016 4:08 PM

## 2016-10-12 ENCOUNTER — Other Ambulatory Visit: Payer: Self-pay | Admitting: Family Medicine

## 2016-12-01 ENCOUNTER — Ambulatory Visit (INDEPENDENT_AMBULATORY_CARE_PROVIDER_SITE_OTHER): Payer: 59 | Admitting: Internal Medicine

## 2016-12-01 ENCOUNTER — Encounter: Payer: Self-pay | Admitting: Internal Medicine

## 2016-12-01 VITALS — BP 124/78 | HR 96 | Temp 99.5°F | Wt 238.0 lb

## 2016-12-01 DIAGNOSIS — J018 Other acute sinusitis: Secondary | ICD-10-CM

## 2016-12-01 DIAGNOSIS — H6123 Impacted cerumen, bilateral: Secondary | ICD-10-CM | POA: Diagnosis not present

## 2016-12-01 MED ORDER — HYDROCODONE-HOMATROPINE 5-1.5 MG/5ML PO SYRP: 5.0000 mL | ORAL_SOLUTION | Freq: Three times a day (TID) | ORAL | 0 refills | Status: DC | PRN

## 2016-12-01 NOTE — Patient Instructions (Signed)

## 2016-12-01 NOTE — Progress Notes (Signed)
HPI  Pt presents to the clinic today with c/o nasal congestion, cough and chest congestion. This started 3 days ago. He is blowing clear mucous out of his nose. The cough is productive of clear, green mucous. He reports associated sneezing. He denies fever, chills or body aches. He denies shortness of breath. He has tried Mucinex with minimal relief. He has no history of allergies. He has had sick contacts. He does not smoke.  Review of Systems        Past Medical History:  Diagnosis Date  . Dizziness   . Esophageal reflux   . Hiatal hernia   . Low back pain   . Other and unspecified hyperlipidemia   . Seizure disorder ([place])   . Seizures ([place])    01-28-16 pt states he hasn't had a seizure in 10 years  . Tremor     Family History  Problem Relation Age of Onset  . Stroke Sister   . Alcohol abuse Brother   . Alzheimer's disease Mother   . Cancer Father   . Colon cancer Neg Hx   . Esophageal cancer Neg Hx   . Stomach cancer Neg Hx   . Rectal cancer Neg Hx     Social History   Social History  . Marital status: Married    Spouse name: N/A  . Number of children: 1  . Years of education: N/A   Occupational History  . [employer]   Social History Main Topics  . Smoking status: Former Smoker    Quit date: 07/21/2002  . Smokeless tobacco: Never Used  . Alcohol use Yes     Comment: socially  . Drug use: No  . Sexual activity: Not on file   Other Topics Concern  . Not on file   Social History Narrative      He has been married 31+ years.   Caffeine Use: He drinks coke, tea, and coffee.    Allergies  Allergen Reactions  . Esomeprazole Magnesium     REACTION: abdominal pain  . Simvastatin     Wasn't working for pt   . Alum Hydroxide-Mag Carbonate     Medication interaction only     Constitutional: Denies headache, fatigue, fever or abrupt weight changes.  HEENT:  Positive nasal congestion. Denies eye redness,  eye pain, pressure behind the eyes, facial pain, ear pain, ringing in the ears, wax buildup, runny nose or bloody nose. Respiratory: Positive cough. Denies difficulty breathing or shortness of breath.  Cardiovascular: Denies chest pain, chest tightness, palpitations or swelling in the hands or feet.   No other specific complaints in a complete review of systems (except as listed in HPI above).  Objective:   BP 124/78   Pulse 96   Temp 99.5 F (37.5 C) (Oral)   Wt 238 lb (108 kg)   SpO2 99%   BMI 29.75 kg/m  Wt Readings from Last 3 Encounters:  12/01/16 238 lb (108 kg)  07/03/16 244 lb (110.7 kg)  02/08/16 227 lb (103 kg)     General: Appears his stated age, in NAD. HEENT: Head: normal shape and size no sinus tenderness noted;  Ears: bilateral cerumen impaction; Nose: mucosa pink and moist, septum midline; Throat/Mouth: + PND. Teeth present, mucosa pink and moist, no exudate noted, no lesions or ulcerations noted.  Neck: No cervical lymphadenopathy.  Cardiovascular: Normal rate and rhythm.  Pulmonary/Chest: Normal effort and positive vesicular breath sounds. No  respiratory distress. No wheezes, rales or ronchi noted.       Assessment & Plan:   Allergic Rhinitis:  Get some rest and drink plenty of water Continue Mucinex Add in Flonase RX for Hycodan as needed for cough  Bilateral Cerumen Impaction:  Manual lavage by CMA Advised him to try Debrox 2 x week to prevent wax buildup  RTC as needed or if symptoms persist.   Webb Silversmith, NP

## 2016-12-05 ENCOUNTER — Ambulatory Visit: Payer: PRIVATE HEALTH INSURANCE | Admitting: Diagnostic Neuroimaging

## 2016-12-05 ENCOUNTER — Ambulatory Visit: Payer: PRIVATE HEALTH INSURANCE | Admitting: Family Medicine

## 2016-12-14 ENCOUNTER — Other Ambulatory Visit: Payer: Self-pay | Admitting: Diagnostic Neuroimaging

## 2016-12-23 ENCOUNTER — Ambulatory Visit (INDEPENDENT_AMBULATORY_CARE_PROVIDER_SITE_OTHER): Payer: PRIVATE HEALTH INSURANCE | Admitting: Diagnostic Neuroimaging

## 2016-12-23 ENCOUNTER — Encounter: Payer: Self-pay | Admitting: Diagnostic Neuroimaging

## 2016-12-23 VITALS — BP 115/79 | HR 88 | Ht 75.0 in | Wt 236.4 lb

## 2016-12-23 DIAGNOSIS — G8929 Other chronic pain: Secondary | ICD-10-CM

## 2016-12-23 DIAGNOSIS — M1990 Unspecified osteoarthritis, unspecified site: Secondary | ICD-10-CM

## 2016-12-23 DIAGNOSIS — M542 Cervicalgia: Secondary | ICD-10-CM

## 2016-12-23 DIAGNOSIS — M25561 Pain in right knee: Secondary | ICD-10-CM | POA: Diagnosis not present

## 2016-12-23 DIAGNOSIS — M25562 Pain in left knee: Secondary | ICD-10-CM

## 2016-12-23 DIAGNOSIS — G40109 Localization-related (focal) (partial) symptomatic epilepsy and epileptic syndromes with simple partial seizures, not intractable, without status epilepticus: Secondary | ICD-10-CM | POA: Diagnosis not present

## 2016-12-23 MED ORDER — PHENYTOIN SODIUM EXTENDED 100 MG PO CAPS: 200.0000 mg | ORAL_CAPSULE | Freq: Two times a day (BID) | ORAL | 4 refills | Status: DC

## 2016-12-23 NOTE — Progress Notes (Signed)
[facility]  PATIENT: Wayne Vang DOB: Nov 30, 1960  REFERRING CLINICIAN:  HISTORY FROM: patient  REASON FOR VISIT: follow up    HISTORICAL  CHIEF COMPLAINT:  Chief Complaint  Patient presents with  . Follow-up    1 year    HISTORY OF PRESENT ILLNESS:   UPDATE 12/23/16: Since last visit, no seizures. Tolerating dilantin. Having more joint aches and pain in the last few years. No other issues.   UPDATE 12/05/15: Since last visit, doing about the same. No seizures. Now off levetiracetam. Continues on phenytoin 200 / 300.   UPDATE 06/04/15: Doing well. No seizures. No vertigo attacks. Tolerating meds.   UPDATE 03/15/14 (LL): since last visit patient has been doing well, reports no seizures. Tolerating Keppra and Dilantin without known side effects. Still with intermittent episodes of vertigo, unchanged. No complaints today.  UPDATE 12/07/12 ([facility]P): since last visit patient doing well. No more seizures. Last seizure was in 2006 in nighttime sleep. His has intermittent episodes of spinning sensation, vertigo, lasting for a few minutes of time. Meclizine seems to help.   PRIOR HPI (11/03/12, Wayne Vang): 56 year old right-handed white married male with a past history of recurrent dizziness beginning in 1985. EEG 08/09/1983 showed epileptiform activity bilaterally in the frontotemporal regions, more prominent on the right. His first generalized seizure was 56/14/1985 and the second occurred 09/07/1983. Wayne Vang has been on Dilantin or phenytoin medication since 09/07/1993. His last generalized major motor seizure was 08/13/2004. At that time Keppra was added to his regiment and he has not had a seizure since. Medications are phenytoin sodium capsules 100 mg 2 in the morning and 3 at night and levitercetam 500 mg one half tablet twice per day. He is also on protonix 40 mg once daily. He discontinued simvastatin because of muscle pain. 11/27/2009 he was at work and developed nausea without  vomiting. He had to sit down. He felt as if he had to focus on the wall and be very still. He was seen at [facility] emergency room where a Chest x-ray was normal and subsequent stress cardiac test was normal. CBC, basic metabolic panel, CPK, and cardiac markers were normal. A phenytoin level was 15.4. He has not had recurrent symptoms. His last EEG 05/19/2010 was normal. His last MRI of the brain 08/21/2004 showed no significant abnormalities. 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 He denies macropsia, micropsia, strange odors or tastes. The patient has headaches behind his eyes. Over the last 4 months he has recurrent dizziness. His has tingling in his left arm. At one point he was on meclizine, but is no longer taking it. He feels dizzy like "I'm on a boat". Sitting down or lying down helps. There is a sensation of "nearly spinning".The symptoms last 30 minutes to an hour. They can occur every 2-3 days. He is treating them with Dramamine 25 mg per day. He describes a lightheaded sensation or pressure sensation in the eyes.    REVIEW OF SYSTEMS: Full 14 system review of systems performed and negative except: headaches joint pain back pain neck pain.    ALLERGIES: Allergies  Allergen Reactions  . Esomeprazole Magnesium     REACTION: abdominal pain  . Simvastatin     Wasn't working for pt   . Alum Hydroxide-Mag Carbonate     Medication interaction only    HOME MEDICATIONS: Outpatient Medications Prior to Visit  Medication Sig Dispense Refill  . Aspirin-Acetaminophen-Caffeine (GOODY  HEADACHE PO) Take by mouth as needed.    . lansoprazole (PREVACID) 30 MG capsule TAKE ONE CAPSULE BY MOUTH EVERY DAY 90 capsule 0  . meclizine (ANTIVERT) 25 MG tablet Take 25 mg by mouth 3 (three) times daily as needed for dizziness.    . phenytoin (DILANTIN) 100 MG ER capsule TAKE TWO CAPSULES BY MOUTH IN THE MORNING AND THREE CAPSULES AT NIGHT 450 capsule 3  .  HYDROcodone-homatropine (HYCODAN) 5-1.5 MG/5ML syrup Take 5 mLs by mouth every 8 (eight) hours as needed. 75 mL 0   No facility-administered medications prior to visit.     PAST MEDICAL HISTORY: Past Medical History:  Diagnosis Date  . Dizziness   . Esophageal reflux   . Hiatal hernia   . Low back pain   . Other and unspecified hyperlipidemia   . Seizure disorder ([place])   . Seizures ([place])    01-28-16 pt states he hasn't had a seizure in 10 years  . Tremor     PAST SURGICAL HISTORY: Past Surgical History:  Procedure Laterality Date  . INGUINAL HERNIA REPAIR Right 12/06/2013   Procedure:  REPAIR RIGHT INGUINAL HERNIA;  Surgeon: Joyice Faster. Cornett, MD;  Location: [place];  Service: General;  Laterality: Right;  . INGUINAL HERNIA REPAIR Left 2015  . INSERTION OF MESH N/A 12/06/2013   Procedure: INSERTION OF MESH;  Surgeon: Joyice Faster. Cornett, MD;  Location: [place];  Service: General;  Laterality: N/A;  . NO PAST SURGERIES      FAMILY HISTORY: Family History  Problem Relation Age of Onset  . Alzheimer's disease Mother   . Cancer Father   . Stroke Sister   . Alcohol abuse Brother   . Colon cancer Neg Hx   . Esophageal cancer Neg Hx   . Stomach cancer Neg Hx   . Rectal cancer Neg Hx     SOCIAL HISTORY:  Social History   Social History  . Marital status: Married    Spouse name: N/A  . Number of children: 1  . Years of education: N/A   Occupational History  . [employer]   Social History Main Topics  . Smoking status: Former Smoker    Quit date: 07/21/2002  . Smokeless tobacco: Never Used  . Alcohol use Yes     Comment: socially  . Drug use: No  . Sexual activity: Not on file   Other Topics Concern  . Not on file   Social History Narrative      He has been married 31+ years.   Caffeine Use: He drinks coke, tea, and coffee.     PHYSICAL EXAM  GENERAL  EXAM/CONSTITUTIONAL: Vitals:  Vitals:   12/23/16 1414  BP: 115/79  Pulse: 88  Weight: 236 lb 6 oz (107.2 kg)  Height: 6\' 3"  (1.905 m)   Wt Readings from Last 3 Encounters:  12/23/16 236 lb 6 oz (107.2 kg)  12/01/16 238 lb (108 kg)  07/03/16 244 lb (110.7 kg)   Body mass index is 29.54 kg/m. No exam data present  Patient is in no distress; well developed, nourished and groomed; neck is supple  CARDIOVASCULAR:  Examination of carotid arteries is normal; no carotid bruits  Regular rate and rhythm, no murmurs  Examination of peripheral vascular system by observation and palpation is normal  EYES:  Ophthalmoscopic exam of optic discs and posterior segments is normal; no papilledema or hemorrhages  MUSCULOSKELETAL:  Gait, strength, tone, movements noted in Neurologic exam below  NEUROLOGIC: MENTAL STATUS:  No flowsheet data found.  awake, alert, oriented to person, place and time  recent and remote memory intact  normal attention and concentration  language fluent, comprehension intact, naming intact,   fund of knowledge appropriate  CRANIAL NERVE:   2nd - no papilledema on fundoscopic exam  2nd, 3rd, 4th, 6th - pupils equal and reactive to light, visual fields full to confrontation, extraocular muscles intact, no nystagmus  5th - facial sensation symmetric  7th - facial strength symmetric  8th - hearing intact  9th - palate elevates symmetrically, uvula midline  11th - shoulder shrug symmetric  12th - tongue protrusion midline  MOTOR:   normal bulk and tone, full strength in the BUE, BLE  SENSORY:   normal and symmetric to light touch, temperature, vibration  COORDINATION:   finger-nose-finger, fine finger movements normal  REFLEXES:   deep tendon reflexes present and symmetric  GAIT/STATION:   narrow based gait    DIAGNOSTIC DATA (LABS, IMAGING, TESTING) - I reviewed patient records, labs, notes, testing and imaging myself where  available.  Lab Results  Component Value Date   WBC 5.1 12/05/2015   HGB 15.8 12/02/2013   HCT 43.4 12/05/2015   MCV 85 12/05/2015   PLT 185 12/05/2015      Component Value Date/Time   NA 145 (H) 12/05/2015 1601   K 5.0 12/05/2015 1601   CL 102 12/05/2015 1601   CO2 27 12/05/2015 1601   GLUCOSE 85 12/05/2015 1601   GLUCOSE 86 12/02/2013 1445   BUN 12 12/05/2015 1601   CREATININE 0.91 12/05/2015 1601   CALCIUM 9.6 12/05/2015 1601   PROT 7.3 12/05/2015 1601   ALBUMIN 5.0 12/05/2015 1601   AST 24 12/05/2015 1601   ALT 20 12/05/2015 1601   ALKPHOS 93 12/05/2015 1601   BILITOT 0.5 12/05/2015 1601   GFRNONAA 95 12/05/2015 1601   GFRAA 110 12/05/2015 1601   Lab Results  Component Value Date   CHOL 178 09/03/2010   HDL 61.10 09/03/2010   LDLCALC 94 09/03/2010   LDLDIRECT 194.1 04/27/2009   TRIG 116.0 09/03/2010   CHOLHDL 3 09/03/2010   No results found for: HGBA1C No results found for: VITAMINB12 Lab Results  Component Value Date   TSH 0.88 09/03/2010    11/12/12 MRI brain  - mild chronic small vessel ischemic disease     ASSESSMENT AND PLAN  56 y.o. year old male here with seizure disorder, stable. No seizures since 2006.    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 Greater than 50% of time was spent in counseling and coordination of care with patient. In summary we discussed:   SEIZURE DISORDER (established problem, stable) - last seizure 2006 - reduce phenytoin to 200mg  twice a day; in future may consider slowly reducing dose further  ARTHRITIS / JOINT PAIN (new problem, no additional workup) - reduce sugar intake; increase plant intake - start gentle stretching / strengthening exercises  Meds ordered this encounter  Medications  . phenytoin (DILANTIN) 100 MG ER capsule    Sig: Take 2 capsules (200 mg total) by mouth 2 (two) times  daily.    Dispense:  360 capsule    Refill:  4   Return in about 1 year (around 12/23/2017).    Abie Cheek R.  Addilyne Backs, MD 11/22/4918, 1:00 PM Certified in Neurology, Neurophysiology and Neuroimaging  [facility] [address] [place], [place] [address] [phone]

## 2016-12-23 NOTE — Patient Instructions (Signed)
  SEIZURE DISORDER (established problem, stable) - last seizure 2006 - reduce phenytoin to 200mg  twice a day; in future may consider slowly reducing dose further  ARTHRITIS / JOINT PAIN (new problem, no additional workup) - reduce sugar intake; increase plant intake - start gentle stretching / strengthening exercises

## 2017-01-11 ENCOUNTER — Other Ambulatory Visit: Payer: Self-pay | Admitting: Family Medicine

## 2017-01-12 NOTE — Telephone Encounter (Signed)
Last refill 10/12/16 #90 Last office visit 11/21/16/acute See allergy/contrainidcation Okay to refill?

## 2017-04-01 ENCOUNTER — Other Ambulatory Visit: Payer: Self-pay | Admitting: Family Medicine

## 2017-04-19 ENCOUNTER — Other Ambulatory Visit: Payer: Self-pay | Admitting: Family Medicine

## 2017-04-20 ENCOUNTER — Ambulatory Visit (INDEPENDENT_AMBULATORY_CARE_PROVIDER_SITE_OTHER): Payer: 59 | Admitting: Family Medicine

## 2017-04-20 ENCOUNTER — Ambulatory Visit (INDEPENDENT_AMBULATORY_CARE_PROVIDER_SITE_OTHER)
Admission: RE | Admit: 2017-04-20 | Discharge: 2017-04-20 | Disposition: A | Discharge status: Home / Self Care | Payer: 59 | Source: Ambulatory Visit | Attending: Family Medicine | Admitting: Family Medicine

## 2017-04-20 VITALS — BP 114/76 | HR 102 | Temp 98.2°F | Wt 234.5 lb

## 2017-04-20 DIAGNOSIS — S59902A Unspecified injury of left elbow, initial encounter: Secondary | ICD-10-CM | POA: Diagnosis not present

## 2017-04-20 MED ORDER — LANSOPRAZOLE 30 MG PO CPDR: 30.0000 mg | DELAYED_RELEASE_CAPSULE | Freq: Every day | ORAL | 0 refills | Status: DC
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
## 2017-04-20 NOTE — Patient Instructions (Signed)
Your xray did not show a fracture or break, but you do have soft tissue swelling  Can apply an acre wrap for comfort. Try to do regular activities.   If not better in 2-3 weeks or if pain increases, please schedule with Dr. Lorelei Pont  Please schedule a complete physical in next 1-3 months

## 2017-04-22 ENCOUNTER — Encounter: Payer: Self-pay | Admitting: Family Medicine

## 2017-07-17 ENCOUNTER — Other Ambulatory Visit: Payer: Self-pay | Admitting: Family Medicine

## 2017-07-17 NOTE — Telephone Encounter (Signed)
Last office visit 04/20/2017 with Glenda Chroman for elbow injury.  Last seen by PCP 12/05/2015.  Was told at last refilled that he needed to schedule CPE with Dr. Lorelei Pont.  No future appointments.  Refill?

## 2017-12-22 ENCOUNTER — Other Ambulatory Visit: Payer: Self-pay | Admitting: Diagnostic Neuroimaging

## 2017-12-28 ENCOUNTER — Ambulatory Visit: Payer: PRIVATE HEALTH INSURANCE | Admitting: Diagnostic Neuroimaging

## 2017-12-28 ENCOUNTER — Encounter: Payer: Self-pay | Admitting: Diagnostic Neuroimaging

## 2017-12-28 VITALS — BP 128/86 | HR 93 | Ht 73.0 in | Wt 241.0 lb

## 2017-12-28 DIAGNOSIS — G40109 Localization-related (focal) (partial) symptomatic epilepsy and epileptic syndromes with simple partial seizures, not intractable, without status epilepticus: Secondary | ICD-10-CM

## 2017-12-28 DIAGNOSIS — M1990 Unspecified osteoarthritis, unspecified site: Secondary | ICD-10-CM | POA: Diagnosis not present

## 2017-12-28 MED ORDER — PHENYTOIN SODIUM EXTENDED 100 MG PO CAPS: ORAL_CAPSULE | ORAL | 4 refills | Status: DC

## 2017-12-28 NOTE — Progress Notes (Signed)
[facility]  PATIENT: Wayne Vang DOB: 1960-08-05  REFERRING CLINICIAN:  HISTORY FROM: patient  REASON FOR VISIT: follow up    HISTORICAL  CHIEF COMPLAINT:  Chief Complaint  Patient presents with  . Epilepsy    rm 6, "tried to reduce Dilantin but began to feel funny; taking 2 tabs in am, 3 tabs in pm"  . Follow-up    one year    HISTORY OF PRESENT ILLNESS:   UPDATE (12/28/17, VRP): Since last visit, doing well. Tolerating dilantin. No alleviating or aggravating factors. Tried to lower to 200mg  twice a day, but felt "funny" and now back to 200 / 300.   UPDATE 12/23/16: Since last visit, no seizures. Tolerating dilantin. Having more joint aches and pain in the last few years. No other issues.   UPDATE 12/05/15: Since last visit, doing about the same. No seizures. Now off levetiracetam. Continues on phenytoin 200 / 300.   UPDATE 06/04/15: Doing well. No seizures. No vertigo attacks. Tolerating meds.   UPDATE 03/15/14 (LL): since last visit patient has been doing well, reports no seizures. Tolerating Keppra and Dilantin without known side effects. Still with intermittent episodes of vertigo, unchanged. No complaints today.  UPDATE 12/07/12 (VRP): since last visit patient doing well. No more seizures. Last seizure was in 2006 in nighttime sleep. His has intermittent episodes of spinning sensation, vertigo, lasting for a few minutes of time. Meclizine seems to help.   PRIOR HPI (11/03/12, Dr. Erling Cruz): 57 year old right-handed white married male with a past history of recurrent dizziness beginning in 1985. EEG 08/09/1983 showed epileptiform activity bilaterally in the frontotemporal regions, more prominent on the right. His first generalized seizure was 08/04/1983 and the second occurred 09/07/1983. [name] has been on Dilantin or phenytoin medication since 09/07/1993. His last generalized major motor seizure was 08/13/2004. At that time Keppra was added to his regiment and he has  not had a seizure since. Medications are phenytoin sodium capsules 100 mg 2 in the morning and 3 at night and levitercetam 500 mg one half tablet twice per day. He is also on protonix 40 mg once daily. He discontinued simvastatin because of muscle pain. 11/27/2009 he was at work and developed nausea without vomiting. He had to sit down. He felt as if he had to focus on the wall and be very still. He was seen at [facility] emergency room where a Chest x-ray was normal and subsequent stress cardiac test was normal. CBC, basic metabolic panel, CPK, and cardiac markers were normal. A phenytoin level was 15.4. He has not had recurrent symptoms. His last EEG 05/19/2010 was normal. His last MRI of the brain 08/21/2004 showed no significant abnormalities. 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 He denies macropsia, micropsia, strange odors or tastes. The patient has headaches behind his eyes. Over the last 4 months he has recurrent dizziness. His has tingling in his left arm. At one point he was on meclizine, but is no longer taking it. He feels dizzy like "I'm on a boat". Sitting down or lying down helps. There is a sensation of "nearly spinning".The symptoms last 30 minutes to an hour. They can occur every 2-3 days. He is treating them with Dramamine 25 mg per day. He describes a lightheaded sensation or pressure sensation in the eyes.    REVIEW OF SYSTEMS: Full 14 system review of systems performed and negative except: joint pain back pain.    ALLERGIES:  Allergies  Allergen Reactions  . Esomeprazole Magnesium     REACTION: abdominal pain  . Simvastatin     Wasn't working for pt   . Alum Hydroxide-Mag Carbonate     Medication interaction only    HOME MEDICATIONS: Outpatient Medications Prior to Visit  Medication Sig Dispense Refill  . Aspirin-Acetaminophen-Caffeine (GOODY HEADACHE PO) Take by mouth as needed.    . lansoprazole (PREVACID) 30 MG capsule TAKE 1  CAPSULE BY MOUTH EVERY DAY 90 capsule 3  . phenytoin (DILANTIN) 100 MG ER capsule Take 2 capsules (200 mg total) by mouth 2 (two) times daily. 360 capsule 4  . meclizine (ANTIVERT) 25 MG tablet Take 25 mg by mouth 3 (three) times daily as needed for dizziness.     No facility-administered medications prior to visit.     PAST MEDICAL HISTORY: Past Medical History:  Diagnosis Date  . Dizziness   . Esophageal reflux   . Hiatal hernia   . Low back pain   . Other and unspecified hyperlipidemia   . Seizure disorder ([place])   . Seizures ([place])    01-28-16 pt states he hasn't had a seizure in 10 years  . Tremor     PAST SURGICAL HISTORY: Past Surgical History:  Procedure Laterality Date  . INGUINAL HERNIA REPAIR Right 12/06/2013   Procedure:  REPAIR RIGHT INGUINAL HERNIA;  Surgeon: Joyice Faster. Cornett, MD;  Location: [facility];  Service: General;  Laterality: Right;  . INGUINAL HERNIA REPAIR Left 2015  . INSERTION OF MESH N/A 12/06/2013   Procedure: INSERTION OF MESH;  Surgeon: Joyice Faster. Cornett, MD;  Location: [facility];  Service: General;  Laterality: N/A;  . NO PAST SURGERIES      FAMILY HISTORY: Family History  Problem Relation Age of Onset  . Alzheimer's disease Mother   . Cancer Father   . Stroke Sister   . Alcohol abuse Brother   . Colon cancer Neg Hx   . Esophageal cancer Neg Hx   . Stomach cancer Neg Hx   . Rectal cancer Neg Hx     SOCIAL HISTORY:  Social History   Socioeconomic History  . Marital status: Married    Spouse name: Not on file  . Number of children: 1  . Years of education: Not on file  . Highest education level: Not on file  Occupational History  . Occupation: Shop Printmaker: [employer]    Comment: [place]  Social Needs  . Financial resource strain: Not on file  . Food insecurity:    Worry: Not on file    Inability: Not on file  . Transportation needs:    Medical: Not on file      Non-medical: Not on file  Tobacco Use  . Smoking status: Former Smoker    Last attempt to quit: 07/21/2002    Years since quitting: 15.4  . Smokeless tobacco: Never Used  Substance and Sexual Activity  . Alcohol use: Yes    Comment: socially  . Drug use: No  . Sexual activity: Not on file  Lifestyle  . Physical activity:    Days per week: Not on file    Minutes per session: Not on file  . Stress: Not on file  Relationships  . Social connections:    Talks on phone: Not on file    Gets together: Not on file    Attends religious service: Not on file  Active member of club or organization: Not on file    Attends meetings of clubs or organizations: Not on file    Relationship status: Not on file  . Intimate partner violence:    Fear of current or ex partner: Not on file    Emotionally abused: Not on file    Physically abused: Not on file    Forced sexual activity: Not on file  Other Topics Concern  . Not on file  Social History Narrative      He has been married 31+ years.   Caffeine Use: He drinks coke, tea, and coffee.   Truck Dealer     PHYSICAL EXAM  GENERAL EXAM/CONSTITUTIONAL: Vitals:  Vitals:   12/28/17 1450  BP: 128/86  Pulse: 93  Weight: 241 lb (109.3 kg)  Height: 6\' 1"  (1.854 m)   Wt Readings from Last 3 Encounters:  12/28/17 241 lb (109.3 kg)  04/20/17 234 lb 8 oz (106.4 kg)  12/23/16 236 lb 6 oz (107.2 kg)   Body mass index is 31.8 kg/m. No exam data present  Patient is in no distress; well developed, nourished and groomed; neck is supple  CARDIOVASCULAR:  Examination of carotid arteries is normal; no carotid bruits  Regular rate and rhythm, no murmurs  Examination of peripheral vascular system by observation and palpation is normal  EYES:  Ophthalmoscopic exam of optic discs and posterior segments is normal; no papilledema or hemorrhages  MUSCULOSKELETAL:  Gait, strength, tone, movements noted in Neurologic exam  below  NEUROLOGIC: MENTAL STATUS:  No flowsheet data found.  awake, alert, oriented to person, place and time  recent and remote memory intact  normal attention and concentration  language fluent, comprehension intact, naming intact,   fund of knowledge appropriate  CRANIAL NERVE:   2nd - no papilledema on fundoscopic exam  2nd, 3rd, 4th, 6th - pupils equal and reactive to light, visual fields full to confrontation, extraocular muscles intact, no nystagmus  5th - facial sensation symmetric  7th - facial strength symmetric  8th - hearing intact  9th - palate elevates symmetrically, uvula midline  11th - shoulder shrug symmetric  12th - tongue protrusion midline  MOTOR:   normal bulk and tone, full strength in the BUE, BLE  SENSORY:   normal and symmetric to light touch, temperature, vibration  COORDINATION:   finger-nose-finger, fine finger movements normal  REFLEXES:   deep tendon reflexes present and symmetric  GAIT/STATION:   narrow based gait    DIAGNOSTIC DATA (LABS, IMAGING, TESTING) - I reviewed patient records, labs, notes, testing and imaging myself where available.  Lab Results  Component Value Date   WBC 5.1 12/05/2015   HGB 15.6 12/05/2015   HCT 43.4 12/05/2015   MCV 85 12/05/2015   PLT 185 12/05/2015      Component Value Date/Time   NA 145 (H) 12/05/2015 1601   K 5.0 12/05/2015 1601   CL 102 12/05/2015 1601   CO2 27 12/05/2015 1601   GLUCOSE 85 12/05/2015 1601   GLUCOSE 86 12/02/2013 1445   BUN 12 12/05/2015 1601   CREATININE 0.91 12/05/2015 1601   CALCIUM 9.6 12/05/2015 1601   PROT 7.3 12/05/2015 1601   ALBUMIN 5.0 12/05/2015 1601   AST 24 12/05/2015 1601   ALT 20 12/05/2015 1601   ALKPHOS 93 12/05/2015 1601   BILITOT 0.5 12/05/2015 1601   GFRNONAA 95 12/05/2015 1601   GFRAA 110 12/05/2015 1601   Lab Results  Component Value Date   CHOL  178 09/03/2010   HDL 61.10 09/03/2010   LDLCALC 94 09/03/2010   LDLDIRECT  194.1 04/27/2009   TRIG 116.0 09/03/2010   CHOLHDL 3 09/03/2010   No results found for: HGBA1C No results found for: VITAMINB12 Lab Results  Component Value Date   TSH 0.88 09/03/2010    11/12/12 MRI brain  - mild chronic small vessel ischemic disease     ASSESSMENT AND PLAN  57 y.o. year old male here with seizure disorder, stable. No seizures since 2006.    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 phenytoin (DILANTIN) 100 MG ER capsule    Sig: Take 2 capsules (200 mg total) by mouth daily AND 3 capsules (300 mg total) at bedtime.    Dispense:  450 capsule    Refill:  4   Return in about 1 year (around 12/29/2018) for with NP.    Penni Bombard, MD 11/14/8339, 9:62 PM Certified in Neurology, Neurophysiology and [facility] [address], [place], [place] [address] [phone]

## 2017-12-28 NOTE — Patient Instructions (Signed)
-   continue phenytoin (dilantin)

## 2018-07-07 ENCOUNTER — Other Ambulatory Visit: Payer: Self-pay | Admitting: Family Medicine

## 2018-07-25 ENCOUNTER — Other Ambulatory Visit: Payer: Self-pay | Admitting: Family Medicine

## 2018-07-28 ENCOUNTER — Telehealth: Payer: Self-pay | Admitting: Family Medicine

## 2018-07-28 MED ORDER — LANSOPRAZOLE 30 MG PO CPDR: DELAYED_RELEASE_CAPSULE | ORAL | 0 refills | Status: DC

## 2018-07-28 NOTE — Telephone Encounter (Signed)
Refill sent as requested. 

## 2018-07-28 NOTE — Telephone Encounter (Signed)
Pt need refill for lansoprazole 30mg   Sent to [facility]  Pt has appt 2.12

## 2018-08-29 NOTE — Progress Notes (Signed)
Dr. Frederico Hamman T. Anastasia Tompson, MD, [facility] [place], [address] Phone: [phone] Fax: [phone]  09/01/2018  Patient: Wayne Vang, MRN: [id number], DOB: 07-Nov-1960, 58 y.o.  Primary Physician:  Strey Loffler, MD   Chief Complaint  Patient presents with  . Medication Refill   Subjective:   Wayne Vang is a 58 y.o. pleasant patient who presents with the following:  Preventative Health Maintenance Visit:  Health Maintenance Summary Reviewed and updated, unless pt declines services.  Tobacco History Reviewed. Alcohol: No concerns, no excessive use Exercise Habits: Some activity, rec at least 30 mins 5 times a week - minimal now STD concerns: no risk or activity to increase risk Drug Use: None Encouraged self-testicular check  He has not had a CPX in many years.   Taking some potassium at night - for leg cramps  Low testosterone questions ED? 50% of time will lose it  Tdap - needed  Health Maintenance  Topic Date Due  . Hepatitis C Screening  Oct 26, 1960  . HIV Screening  02/19/1976  . INFLUENZA VACCINE  10/19/2018 (Originally 02/18/2018)  . COLONOSCOPY  02/08/2019  . TETANUS/TDAP  09/01/2028   Immunization History  Administered Date(s) Administered  . Tdap 09/01/2018   Patient Active Problem List   Diagnosis Date Noted  . 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 HYPERLIPIDEMIA 01/26/2009  . SEIZURES, HX OF (DR. LOVE) 07/27/2008  . GERD 04/21/2007   Past Medical History:  Diagnosis Date  . Dizziness   . Esophageal reflux   . Hiatal hernia   . Low back pain   . Other and unspecified hyperlipidemia   . Seizure disorder ([place])   . Seizures ([place])    01-28-16 pt states he hasn't had a seizure in 10 years  . Tremor    Past Surgical History:  Procedure Laterality Date  . INGUINAL HERNIA REPAIR Right 12/06/2013   Procedure:  REPAIR RIGHT INGUINAL HERNIA;  Surgeon: Joyice Faster. Cornett,  MD;  Location: [facility];  Service: General;  Laterality: Right;  . INGUINAL HERNIA REPAIR Left 2015  . INSERTION OF MESH N/A 12/06/2013   Procedure: INSERTION OF MESH;  Surgeon: Joyice Faster. Cornett, MD;  Location: [facility];  Service: General;  Laterality: N/A;  . NO PAST SURGERIES     Social History   Socioeconomic History  . Marital status: Married    Spouse name: Not on file  . Number of children: 1  . Years of education: Not on file  . Highest education level: Not on file  Occupational History  . Occupation: Shop Printmaker: [employer]    Comment: [place]  Social Needs  . Financial resource strain: Not on file  . Food insecurity:    Worry: Not on file    Inability: Not on file  . Transportation needs:    Medical: Not on file    Non-medical: Not on file  Tobacco Use  . Smoking status: Former Smoker    Last attempt to quit: 07/21/2002    Years since quitting: 16.1  . Smokeless tobacco: Never Used  Substance and Sexual Activity  . Alcohol use: Yes    Comment: socially  . Drug use: No  . Sexual activity: Not on file  Lifestyle  . Physical activity:    Days per week: Not on file    Minutes per session: Not on file  . Stress:  Not on file  Relationships  . Social connections:    Talks on phone: Not on file    Gets together: Not on file    Attends religious service: Not on file    Active member of club or organization: Not on file    Attends meetings of clubs or organizations: Not on file    Relationship status: Not on file  . Intimate partner violence:    Fear of current or ex partner: Not on file    Emotionally abused: Not on file    Physically abused: Not on file    Forced sexual activity: Not on file  Other Topics Concern  . Not on file  Social History Narrative      He has been married 31+ years.   Caffeine Use: He drinks coke, tea, and coffee.   Truck Dealer   Family History  Problem  Relation Age of Onset  . Alzheimer's disease Mother   . Cancer Father   . Stroke Sister   . Alcohol abuse Brother   . Colon cancer Neg Hx   . Esophageal cancer Neg Hx   . Stomach cancer Neg Hx   . Rectal cancer Neg Hx    Allergies  Allergen Reactions  . Esomeprazole Magnesium     REACTION: abdominal pain  . Simvastatin     Wasn't working for pt   . Alum Hydroxide-Mag Carbonate     Medication interaction only    Medication list has been reviewed and updated.   General: Denies fever, chills, sweats. No significant weight loss. Eyes: Denies blurring,significant itching ENT: Denies earache, sore throat, and hoarseness. Cardiovascular: Denies chest pains, palpitations, dyspnea on exertion Respiratory: Denies cough, dyspnea at rest,wheeezing Breast: no concerns about lumps GI: Denies nausea, vomiting, diarrhea, constipation, change in bowel habits, abdominal pain, melena, hematochezia GU: Denies penile discharge, ED, urinary flow / outflow problems. No STD concerns. Musculoskeletal: Denies back pain, joint pain Derm: Denies rash, itching Neuro: Denies  paresthesias, frequent falls, frequent headaches Psych: Denies depression, anxiety Endocrine: Denies cold intolerance, heat intolerance, polydipsia Heme: Denies enlarged lymph nodes Allergy: No hayfever  Objective:   BP 104/64   Pulse 80   Temp 98.3 F (36.8 C) (Oral)   Ht 6' 1.5" (1.867 m)   Wt 236 lb 4 oz (107.2 kg)   BMI 30.75 kg/m  Ideal Body Weight: Weight in (lb) to have BMI = 25: 191.7  No exam data present  GEN: well developed, well nourished, no acute distress Eyes: conjunctiva and lids normal, PERRLA, EOMI ENT: TM clear, nares clear, oral exam WNL Neck: supple, no lymphadenopathy, no thyromegaly, no JVD Pulm: clear to auscultation and percussion, respiratory effort normal CV: regular rate and rhythm, S1-S2, no murmur, rub or gallop, no bruits, peripheral pulses normal and symmetric, no cyanosis, clubbing,  edema or varicosities GI: soft, non-tender; no hepatosplenomegaly, masses; active bowel sounds all quadrants GU: no hernia, testicular mass, penile discharge Lymph: no cervical, axillary or inguinal adenopathy MSK: gait normal, muscle tone and strength WNL, no joint swelling, effusions, discoloration, crepitus  SKIN: clear, good turgor, color WNL, no rashes, lesions, or ulcerations Neuro: normal mental status, normal strength, sensation, and motion Psych: alert; oriented to person, place and time, normally interactive and not anxious or depressed in appearance. All labs reviewed with patient.  Lipids: Lab Results  Component Value Date   CHOL 178 09/03/2010   Lab Results  Component Value Date   HDL 61.10 09/03/2010   Lab Results  Component Value Date   LDLCALC 94 09/03/2010   Lab Results  Component Value Date   TRIG 116.0 09/03/2010   Lab Results  Component Value Date   CHOLHDL 3 09/03/2010   CBC: CBC Latest Ref Rng & Units 12/05/2015 12/02/2013 01/26/2013  WBC 3.4 - 10.8 x10E3/uL 5.1 6.6 5.7  Hemoglobin 12.6 - 17.7 g/dL 15.6 15.8 16.7  Hematocrit 37.5 - 51.0 % 43.4 43.8 43.9  Platelets 150 - 379 x10E3/uL 185 154 465    Basic Metabolic Panel:    Component Value Date/Time   NA 145 (H) 12/05/2015 1601   K 5.0 12/05/2015 1601   CL 102 12/05/2015 1601   CO2 27 12/05/2015 1601   BUN 12 12/05/2015 1601   CREATININE 0.91 12/05/2015 1601   GLUCOSE 85 12/05/2015 1601   GLUCOSE 86 12/02/2013 1445   CALCIUM 9.6 12/05/2015 1601   Hepatic Function Latest Ref Rng & Units 12/05/2015 12/02/2013 01/26/2013  Total Protein 6.0 - 8.5 g/dL 7.3 7.4 7.2  Albumin 3.5 - 5.5 g/dL 5.0 5.0 4.3  AST 0 - 40 IU/L '24 22 26  '$ ALT 0 - 44 IU/L 20 19 33  Alk Phosphatase 39 - 117 IU/L 93 90 103  Total Bilirubin 0.0 - 1.2 mg/dL 0.5 0.6 0.4  Bilirubin, Direct 0.0 - 0.3 mg/dL - - -    No results found for: HGBA1C Lab Results  Component Value Date   TSH 0.88 09/03/2010   Lab Results  Component Value  Date   PSA 0.49 09/03/2010   PSA 0.77 01/12/2009    Assessment and Plan:   Healthcare maintenance - Plan: Basic metabolic panel, CBC with Differential/Platelet, Hepatic function panel, Hemoglobin A1c, Lipid panel, PSA, Hepatitis C antibody, HIV Antibody (routine testing w rflx), Tdap vaccine greater than or equal to 7yo IM  Need for hepatitis C screening test - Plan: Hepatitis C antibody  Screening for HIV (human immunodeficiency virus) - Plan: HIV Antibody (routine testing w rflx)  Need for prophylactic vaccination with combined diphtheria-tetanus-pertussis (DTP) vaccine - Plan: Tdap vaccine greater than or equal to 7yo IM  No recent labs Check Hep C and HIV No recent Tdap  Reviewed overall health maint  Health Maintenance Exam: The patient's preventative maintenance and recommended screening tests for an annual wellness exam were reviewed in full today. Brought up to date unless services declined.  Counselled on the importance of diet, exercise, and its role in overall health and mortality. The patient's FH and SH was reviewed, including their home life, tobacco status, and drug and alcohol status.  Follow-up in 1 year for physical exam or additional follow-up below.  Follow-up: No follow-ups on file. Or follow-up in 1 year if not noted.  Meds ordered this encounter  Medications  . lansoprazole (PREVACID) 30 MG capsule    Sig: TAKE 1 CAPSULE BY MOUTH EVERY DAY    Dispense:  90 capsule    Refill:  3  . sildenafil (VIAGRA) 25 MG tablet    Sig: Take 1-2 tablets (25-50 mg total) by mouth daily as needed for erectile dysfunction.    Dispense:  10 tablet    Refill:  5   Medications Discontinued During This Encounter  Medication Reason  . lansoprazole (PREVACID) 30 MG capsule Reorder   Orders Placed This Encounter  Procedures  . Tdap vaccine greater than or equal to 7yo IM  . Basic metabolic panel  . CBC with Differential/Platelet  . Hepatic function panel  .  Hemoglobin A1c  . Lipid  panel  . PSA  . Hepatitis C antibody  . HIV Antibody (routine testing w rflx)    Signed,  Philomena Buttermore T. Aldora Perman, MD   Allergies as of 09/01/2018      Reactions   Esomeprazole Magnesium    REACTION: abdominal pain   Simvastatin    Wasn't working for pt   Alum Hydroxide-mag Carbonate    Medication interaction only      Medication List       Accurate as of September 01, 2018 11:59 PM. Always use your most recent med list.        GOODY HEADACHE PO Take by mouth as needed.   lansoprazole 30 MG capsule Commonly known as:  PREVACID TAKE 1 CAPSULE BY MOUTH EVERY DAY   meclizine 25 MG tablet Commonly known as:  ANTIVERT Take 25 mg by mouth 3 (three) times daily as needed for dizziness.   phenytoin 100 MG ER capsule Commonly known as:  DILANTIN Take 2 capsules (200 mg total) by mouth daily AND 3 capsules (300 mg total) at bedtime.   sildenafil 25 MG tablet Commonly known as:  VIAGRA Take 1-2 tablets (25-50 mg total) by mouth daily as needed for erectile dysfunction.

## 2018-09-01 ENCOUNTER — Ambulatory Visit: Payer: 59 | Admitting: Family Medicine

## 2018-09-01 ENCOUNTER — Encounter: Payer: Self-pay | Admitting: Family Medicine

## 2018-09-01 VITALS — BP 104/64 | HR 80 | Temp 98.3°F | Ht 73.5 in | Wt 236.2 lb

## 2018-09-01 DIAGNOSIS — Z125 Encounter for screening for malignant neoplasm of prostate: Secondary | ICD-10-CM | POA: Diagnosis not present

## 2018-09-01 DIAGNOSIS — Z1159 Encounter for screening for other viral diseases: Secondary | ICD-10-CM | POA: Diagnosis not present

## 2018-09-01 DIAGNOSIS — Z23 Encounter for immunization: Secondary | ICD-10-CM | POA: Diagnosis not present

## 2018-09-01 DIAGNOSIS — Z0000 Encounter for general adult medical examination without abnormal findings: Secondary | ICD-10-CM | POA: Diagnosis not present

## 2018-09-01 DIAGNOSIS — Z114 Encounter for screening for human immunodeficiency virus [HIV]: Secondary | ICD-10-CM | POA: Diagnosis not present

## 2018-09-01 MED ORDER — LANSOPRAZOLE 30 MG PO CPDR: DELAYED_RELEASE_CAPSULE | ORAL | 3 refills | Status: DC

## 2018-09-01 MED ORDER — SILDENAFIL CITRATE 25 MG PO TABS: 25.0000 mg | ORAL_TABLET | Freq: Every day | ORAL | 5 refills | Status: DC | PRN

## 2018-09-02 ENCOUNTER — Encounter: Payer: Self-pay | Admitting: Family Medicine

## 2018-09-02 LAB — HEPATITIS C ANTIBODY
Hepatitis C Ab: NONREACTIVE
SIGNAL TO CUT-OFF: 0.01 (ref <1.00)

## 2018-09-02 LAB — HEPATIC FUNCTION PANEL
ALT: 17 U/L (ref 0–53)
AST: 21 U/L (ref 0–37)
Albumin: 5.2 g/dL (ref 3.5–5.2)
Alkaline Phosphatase: 77 U/L (ref 39–117)
Bilirubin, Direct: 0.1 mg/dL (ref 0.0–0.3)
Total Bilirubin: 0.7 mg/dL (ref 0.2–1.2)
Total Protein: 7.2 g/dL (ref 6.0–8.3)

## 2018-09-02 LAB — LIPID PANEL
Cholesterol: 251 mg/dL — ABNORMAL HIGH (ref 0–200)
HDL: 70.5 mg/dL (ref >39.00)
LDL Cholesterol: 160 mg/dL — ABNORMAL HIGH (ref 0–99)
NonHDL: 180.89
Total CHOL/HDL Ratio: 4
Triglycerides: 105 mg/dL (ref 0.0–149.0)
VLDL: 21 mg/dL (ref 0.0–40.0)

## 2018-09-02 LAB — HIV ANTIBODY (ROUTINE TESTING W REFLEX): HIV 1&2 Ab, 4th Generation: NONREACTIVE

## 2018-09-02 LAB — CBC WITH DIFFERENTIAL/PLATELET
BASOS PCT: 0.8 % (ref 0.0–3.0)
Basophils Absolute: 0.1 10*3/uL (ref 0.0–0.1)
EOS PCT: 3.6 % (ref 0.0–5.0)
Eosinophils Absolute: 0.2 10*3/uL (ref 0.0–0.7)
HCT: 45 % (ref 39.0–52.0)
HEMOGLOBIN: 15.6 g/dL (ref 13.0–17.0)
Lymphocytes Relative: 29.6 % (ref 12.0–46.0)
Lymphs Abs: 2 10*3/uL (ref 0.7–4.0)
MCHC: 34.7 g/dL (ref 30.0–36.0)
MCV: 89.1 fl (ref 78.0–100.0)
MONO ABS: 0.5 10*3/uL (ref 0.1–1.0)
Monocytes Relative: 6.9 % (ref 3.0–12.0)
NEUTROS ABS: 3.9 10*3/uL (ref 1.4–7.7)
Neutrophils Relative %: 59.1 % (ref 43.0–77.0)
PLATELETS: 180 10*3/uL (ref 150.0–400.0)
RBC: 5.05 Mil/uL (ref 4.22–5.81)
RDW: 13 % (ref 11.5–15.5)
WBC: 6.6 10*3/uL (ref 4.0–10.5)

## 2018-09-02 LAB — BASIC METABOLIC PANEL
BUN: 13 mg/dL (ref 6–23)
CHLORIDE: 102 meq/L (ref 96–112)
CO2: 31 meq/L (ref 19–32)
Calcium: 9.9 mg/dL (ref 8.4–10.5)
Creatinine, Ser: 1.04 mg/dL (ref 0.40–1.50)
GFR: 73.47 mL/min (ref >60.00)
GLUCOSE: 81 mg/dL (ref 70–99)
POTASSIUM: 4.5 meq/L (ref 3.5–5.1)
SODIUM: 142 meq/L (ref 135–145)

## 2018-09-02 LAB — HEMOGLOBIN A1C: Hgb A1c MFr Bld: 5.1 % (ref 4.6–6.5)

## 2018-09-02 LAB — PSA: PSA: 0.91 ng/mL (ref 0.10–4.00)

## 2018-09-06 ENCOUNTER — Telehealth: Payer: Self-pay | Admitting: *Deleted

## 2018-09-06 NOTE — Telephone Encounter (Signed)
Left message for Wayne Vang to return my call in regards to his lab results.

## 2018-09-07 NOTE — Telephone Encounter (Signed)
Left message for Wayne Vang to return my call in regards to his lab results.

## 2018-09-08 NOTE — Telephone Encounter (Signed)
Lab results discussed with wife.  She will talk to [place] to see if he is willing to start a cholesterol medication and call me back tomorrow with his decision.

## 2018-09-09 NOTE — Telephone Encounter (Signed)
noted 

## 2018-09-09 NOTE — Telephone Encounter (Signed)
Pt's wife called back to let Butch Penny know the pt does not want to take the medication because he didn't like the way it made him feel before. Pt prefers to try to get it down himself.

## 2018-09-16 ENCOUNTER — Encounter: Payer: Self-pay | Admitting: Family Medicine

## 2018-10-26 NOTE — Progress Notes (Signed)
Eugenia Eldredge T. Iden Stripling, MD Primary Care and [place] at [facility] [place], [address] Phone: [phone]  FAX: [place] - 58 y.o. male  MRN [id number]  Date of Birth: 1960-12-03  Visit Date: 10/27/2018  PCP: Maltos Loffler, MD  Referred by: Mccain Loffler, MD  Chief Complaint  Patient presents with  . Back Pain   Subjective:   Wayne Vang is a 58 y.o. very pleasant male patient who presents with the following: Back Pain  ongoing for approximately: 5 days, with chronic lbp The patient has had back pain before. The patient called in with some acute back pain and radiculopathy.  LBP on and off and low left it is really bad.  Really flared up on 10/22/2018 on.  Heating pad did not health.  Called teledoc.  3 x 4, 2 x3 d, then 1 until gone. 60 mg, 40 mg, then 20 mg.  Buttocks area had a lot of pain, then with him hit and down past the knee.  Tingling.  No numbness.  ? Weakness. Has used some body pillow.   Tried some alleve, also. Took 5 alleve at one time this morning.   No numbness or tingling. No bowel or bladder incontinence. No focal weakness. Prior interventions: none Physical therapy: No Chiropractic manipulations: No Acupuncture: No Osteopathic manipulation: No Heat or cold: Minimal effect  Past Medical History, Surgical History, Family History, Medications, Allergies have been reviewed and updated if relevant.  Patient Active Problem List   Diagnosis Date Noted  . 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 HYPERLIPIDEMIA 01/26/2009  . SEIZURES, HX OF (DR. LOVE) 07/27/2008  . GERD 04/21/2007    Past Medical History:  Diagnosis Date  . Esophageal reflux   . Hiatal hernia   . Low back pain   . Other and unspecified hyperlipidemia   . Seizure disorder ([place])   . Tremor     Past Surgical History:  Procedure Laterality Date  . INGUINAL HERNIA REPAIR Right 12/06/2013    Procedure:  REPAIR RIGHT INGUINAL HERNIA;  Surgeon: Joyice Faster. Cornett, MD;  Location: [facility];  Service: General;  Laterality: Right;  . INGUINAL HERNIA REPAIR Left 2015  . INSERTION OF MESH N/A 12/06/2013   Procedure: INSERTION OF MESH;  Surgeon: Joyice Faster. Cornett, MD;  Location: [facility];  Service: General;  Laterality: N/A;  . NO PAST SURGERIES      Social History   Socioeconomic History  . Marital status: Married    Spouse name: Not on file  . Number of children: 1  . Years of education: Not on file  . Highest education level: Not on file  Occupational History  . Occupation: Shop Printmaker: [employer]    Comment: [employer]  Social Needs  . Financial resource strain: Not on file  . Food insecurity:    Worry: Not on file    Inability: Not on file  . Transportation needs:    Medical: Not on file    Non-medical: Not on file  Tobacco Use  . Smoking status: Former Smoker    Last attempt to quit: 07/21/2002    Years since quitting: 16.2  . Smokeless tobacco: Never Used  Substance and Sexual Activity  . Alcohol use: Yes    Comment: socially  . Drug use: No  . Sexual activity: Not on file  Lifestyle  . Physical  activity:    Days per week: Not on file    Minutes per session: Not on file  . Stress: Not on file  Relationships  . Social connections:    Talks on phone: Not on file    Gets together: Not on file    Attends religious service: Not on file    Active member of club or organization: Not on file    Attends meetings of clubs or organizations: Not on file    Relationship status: Not on file  . Intimate partner violence:    Fear of current or ex partner: Not on file    Emotionally abused: Not on file    Physically abused: Not on file    Forced sexual activity: Not on file  Other Topics Concern  . Not on file  Social History Narrative      He has been married 31+ years.   Caffeine Use: He  drinks coke, tea, and coffee.   Truck Dealer    Family History  Problem Relation Age of Onset  . Alzheimer's disease Mother   . Cancer Father   . Stroke Sister   . Alcohol abuse Brother   . Colon cancer Neg Hx   . Esophageal cancer Neg Hx   . Stomach cancer Neg Hx   . Rectal cancer Neg Hx     Allergies  Allergen Reactions  . Esomeprazole Magnesium     REACTION: abdominal pain  . Simvastatin     Wasn't working for pt   . Alum Hydroxide-Mag Carbonate     Medication interaction only    Medication list reviewed and updated in full in [place].  GEN: No fevers, chills. Nontoxic. Primarily MSK c/o today. MSK: Detailed in the HPI GI: tolerating PO intake without difficulty Neuro: As above  Otherwise the pertinent positives of the ROS are noted above.    Objective:   Blood pressure 118/74, pulse 83, temperature 98.9 F (37.2 C), temperature source Oral, height 6' 1.5" (1.867 m), weight 231 lb 4 oz (104.9 kg).  Gen: Well-developed,well-nourished,in no acute distress; alert,appropriate and cooperative throughout examination HEENT: Normocephalic and atraumatic without obvious abnormalities.  Ears, externally no deformities Pulm: Breathing comfortably in no respiratory distress Range of motion at  the waist: Flexion, rotation and lateral bending: Minimal ability to extend at the back.  Loss of flexion by about 50 degrees.  Lateral bending is normal with rotational movements decreased by about 20 degrees.  No echymosis or edema Rises to examination table with no difficulty Gait: minimally antalgic  Inspection/Deformity: No abnormality Paraspinus T: Moderate low back pain paraspinous musculature L4-S1 and in the posterior buttocks muscular sure there is marketed pain on palpation  B Ankle Dorsiflexion (L5,4): 5/5 B Great Toe Dorsiflexion (L5,4): 5/5 Heel Walk (L5): WNL Toe Walk (S1): WNL Rise/Squat (L4): WNL, mild pain  SENSORY B Medial Foot (L4): WNL B Dorsum  (L5): WNL B Lateral (S1): WNL Light Touch: WNL Pinprick: WNL  REFLEXES Knee (L4): 2+ Ankle (S1): 2+  B SLR, seated: neg B SLR, supine: unable to complete secondary to pain Exam limited by pain  Radiology: No results found.  Assessment and Plan:   Lumbar disc herniation with radiculopathy  Anatomy reviewed.  Start with medicatons - longer course of steroids, core rehab, and progress from there following low back pain algorithm. No red flags are present.  Basic ROM  Patient Instructions  May 1 - give me a call to see how you are doing  Follow-up: No follow-ups on file.  Meds ordered this encounter  Medications  . cyclobenzaprine (FLEXERIL) 10 MG tablet    Sig: Take 1 tablet (10 mg total) by mouth at bedtime.    Dispense:  30 tablet    Refill:  1  . predniSONE (DELTASONE) 20 MG tablet    Sig: 2 tabs po daily for 5 days, then 1 tab po daily for 5 days    Dispense:  15 tablet    Refill:  0  . HYDROcodone-acetaminophen (NORCO/VICODIN) 5-325 MG tablet    Sig: Take 1 tablet by mouth every 6 (six) hours as needed for up to 5 days for moderate pain or severe pain.    Dispense:  20 tablet    Refill:  0   No orders of the defined types were placed in this encounter.   Signed,  Maud Deed. Ison Wichmann, MD   Allergies as of 10/27/2018      Reactions   Esomeprazole Magnesium    REACTION: abdominal pain   Simvastatin    Wasn't working for pt   Alum Hydroxide-mag Carbonate    Medication interaction only      Medication List       Accurate as of October 27, 2018  3:12 PM. Always use your most recent med list.        cyclobenzaprine 10 MG tablet Commonly known as:  FLEXERIL Take 1 tablet (10 mg total) by mouth at bedtime.   GOODY HEADACHE PO Take by mouth as needed.   HYDROcodone-acetaminophen 5-325 MG tablet Commonly known as:  NORCO/VICODIN Take 1 tablet by mouth every 6 (six) hours as needed for up to 5 days for moderate pain or severe pain.   lansoprazole  30 MG capsule Commonly known as:  PREVACID TAKE 1 CAPSULE BY MOUTH EVERY DAY   meclizine 25 MG tablet Commonly known as:  ANTIVERT Take 25 mg by mouth 3 (three) times daily as needed for dizziness.   phenytoin 100 MG ER capsule Commonly known as:  DILANTIN Take 2 capsules (200 mg total) by mouth daily AND 3 capsules (300 mg total) at bedtime.   predniSONE 20 MG tablet Commonly known as:  DELTASONE   predniSONE 20 MG tablet Commonly known as:  DELTASONE 2 tabs po daily for 5 days, then 1 tab po daily for 5 days   sildenafil 25 MG tablet Commonly known as:  VIAGRA Take 1-2 tablets (25-50 mg total) by mouth daily as needed for erectile dysfunction.

## 2018-10-27 ENCOUNTER — Encounter: Payer: Self-pay | Admitting: Family Medicine

## 2018-10-27 ENCOUNTER — Other Ambulatory Visit: Payer: Self-pay

## 2018-10-27 ENCOUNTER — Ambulatory Visit: Payer: 59 | Admitting: Family Medicine

## 2018-10-27 VITALS — BP 118/74 | HR 83 | Temp 98.9°F | Ht 73.5 in | Wt 231.2 lb

## 2018-10-27 DIAGNOSIS — M5116 Intervertebral disc disorders with radiculopathy, lumbar region: Secondary | ICD-10-CM

## 2018-10-27 MED ORDER — CYCLOBENZAPRINE HCL 10 MG PO TABS: 10.0000 mg | ORAL_TABLET | Freq: Every day | ORAL | 1 refills | Status: DC

## 2018-10-27 MED ORDER — HYDROCODONE-ACETAMINOPHEN 5-325 MG PO TABS: 1.0000 | ORAL_TABLET | Freq: Four times a day (QID) | ORAL | 0 refills | Status: AC | PRN

## 2018-10-27 MED ORDER — PREDNISONE 20 MG PO TABS: ORAL_TABLET | ORAL | 0 refills | Status: DC

## 2018-10-27 NOTE — Patient Instructions (Signed)
May 1 - give me a call to see how you are doing

## 2018-11-01 ENCOUNTER — Telehealth: Payer: Self-pay | Admitting: Family Medicine

## 2018-11-01 NOTE — Telephone Encounter (Signed)
Patient was seen last week for back and leg pain.  Patient has been taking the medications Dr.Copland prescribed for him.  Patient said the pain is some better, but he can't walk on the leg due to the pain.  Patient was given a note to return to work tomorrow.  Patient wants to know if the leave can be extended, due to the pain.  Patient will need another work note for the extension .

## 2018-11-01 NOTE — Telephone Encounter (Signed)
Extended work note written as instructed by Dr. Lorelei Pont.  Mr. Ingle notified by telephone that letter is ready to be picked up at the front desk.  Follow up office visit scheduled for 11/08/2018 at 9:00 am.

## 2018-11-01 NOTE — Telephone Encounter (Signed)
Extend work note until next Monday, and have him follow-up with me face to face next Monday

## 2018-11-08 ENCOUNTER — Ambulatory Visit (INDEPENDENT_AMBULATORY_CARE_PROVIDER_SITE_OTHER)
Admission: RE | Admit: 2018-11-08 | Discharge: 2018-11-08 | Disposition: A | Discharge status: Home / Self Care | Payer: 59 | Source: Ambulatory Visit | Attending: Family Medicine | Admitting: Family Medicine

## 2018-11-08 ENCOUNTER — Ambulatory Visit: Payer: 59 | Admitting: Family Medicine

## 2018-11-08 ENCOUNTER — Other Ambulatory Visit: Payer: Self-pay

## 2018-11-08 ENCOUNTER — Encounter: Payer: Self-pay | Admitting: Family Medicine

## 2018-11-08 VITALS — BP 110/78 | HR 110 | Temp 99.3°F | Ht 73.5 in | Wt 228.0 lb

## 2018-11-08 DIAGNOSIS — R2990 Unspecified symptoms and signs involving the nervous system: Secondary | ICD-10-CM | POA: Diagnosis not present

## 2018-11-08 DIAGNOSIS — M5416 Radiculopathy, lumbar region: Secondary | ICD-10-CM

## 2018-11-08 DIAGNOSIS — M5126 Other intervertebral disc displacement, lumbar region: Secondary | ICD-10-CM

## 2018-11-08 DIAGNOSIS — M5136 Other intervertebral disc degeneration, lumbar region: Secondary | ICD-10-CM

## 2018-11-08 DIAGNOSIS — M21372 Foot drop, left foot: Secondary | ICD-10-CM

## 2018-11-08 IMAGING — DX LUMBAR SPINE - COMPLETE 4+ VIEW
5 series · 5 of 5 positions shown · non-contrast
Comparison: None.

CLINICAL DATA: Pain with radicular symptoms

EXAM:
LUMBAR SPINE - COMPLETE 4+ VIEW

[l-spine ap]
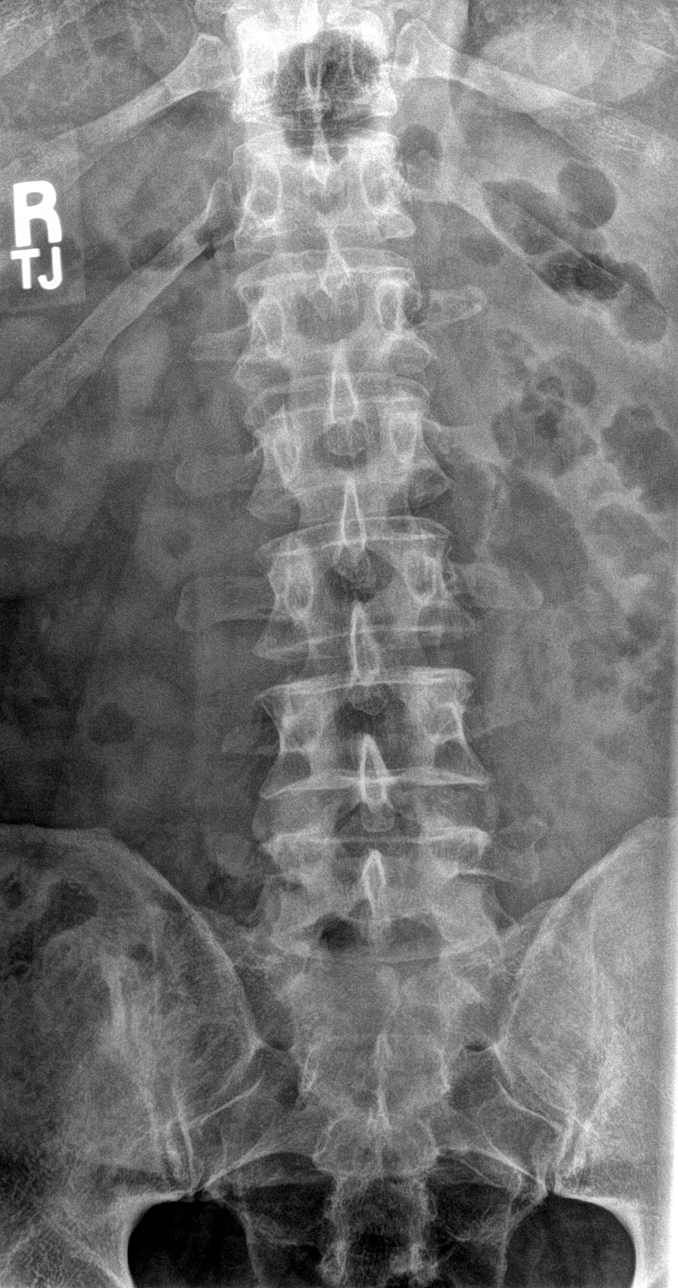

[l-spine obl (1 of 2)]
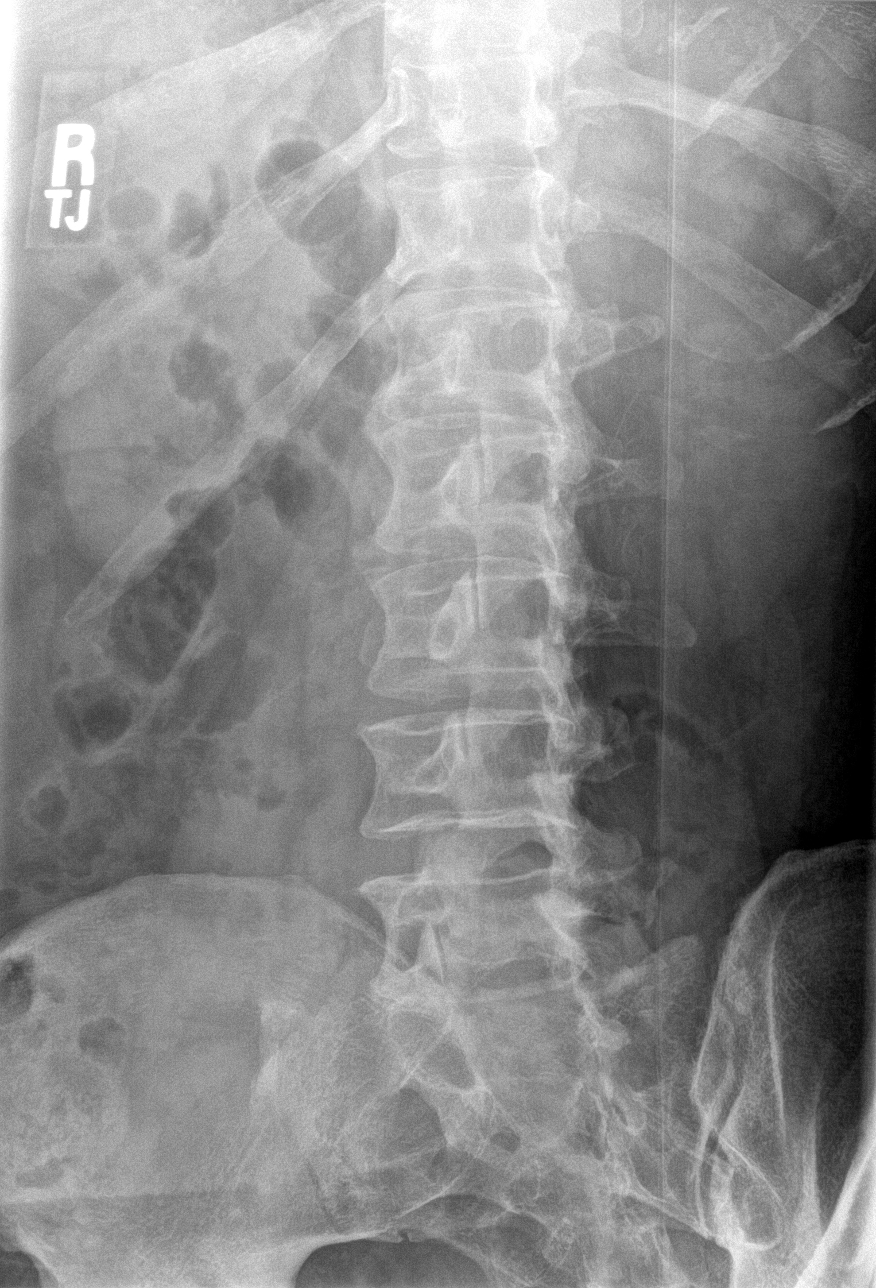

[l-spine obl (2 of 2)]
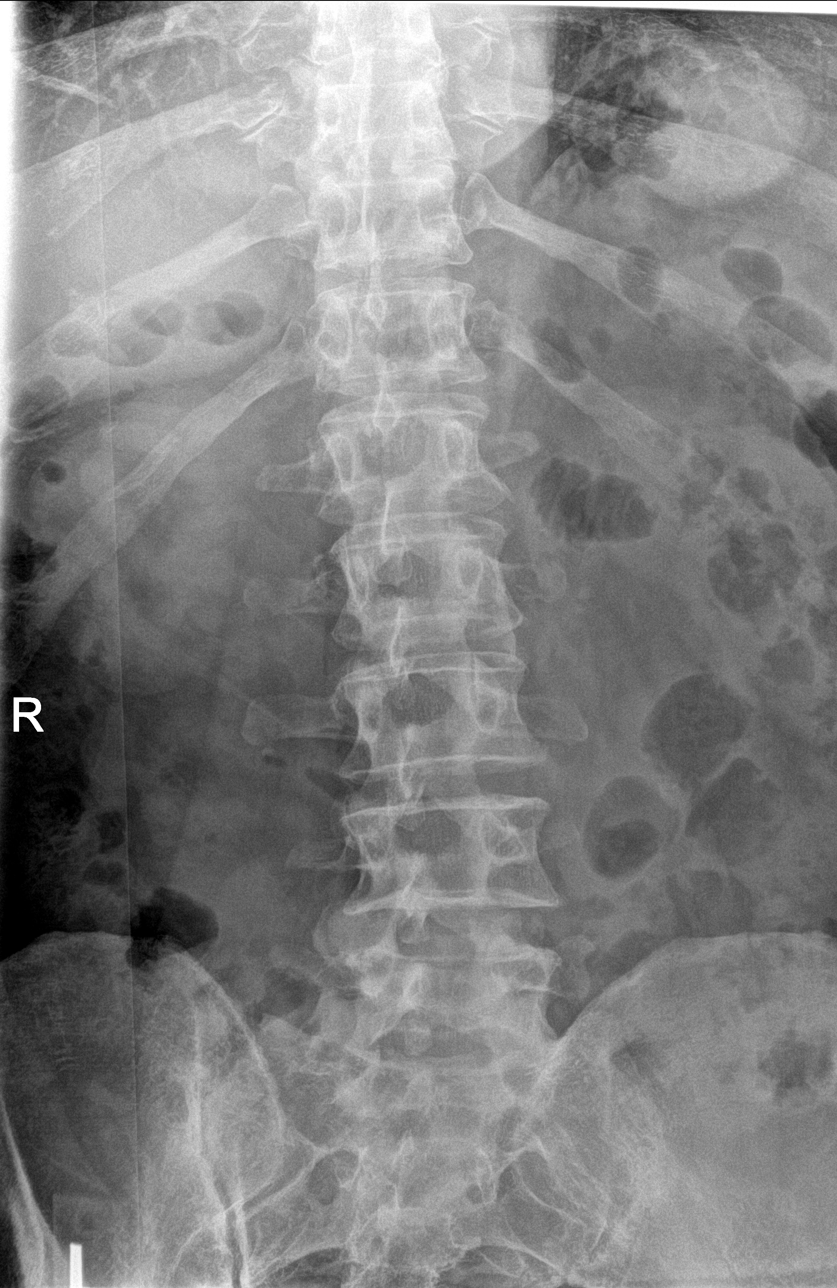

[l-spine lat]
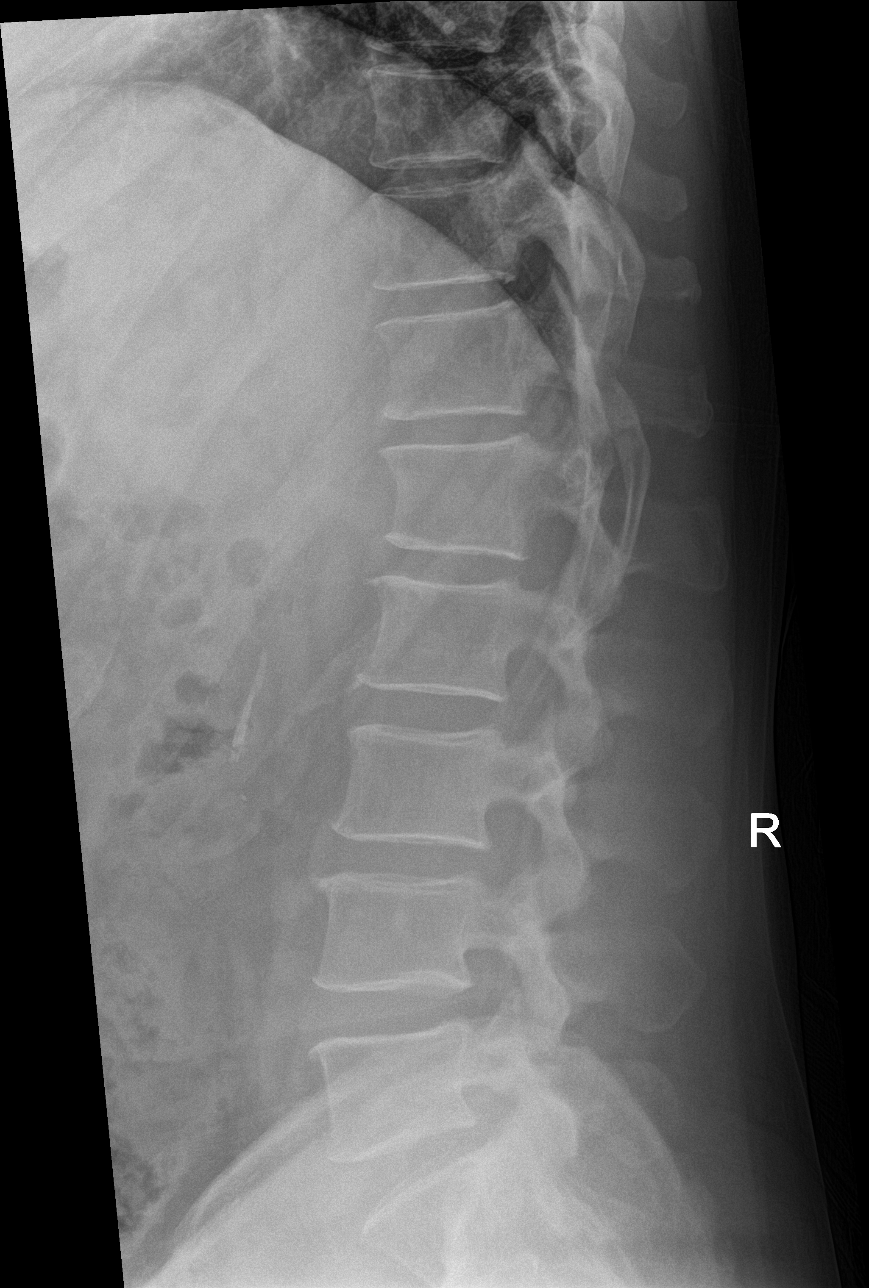

[l-spine l5/s1]
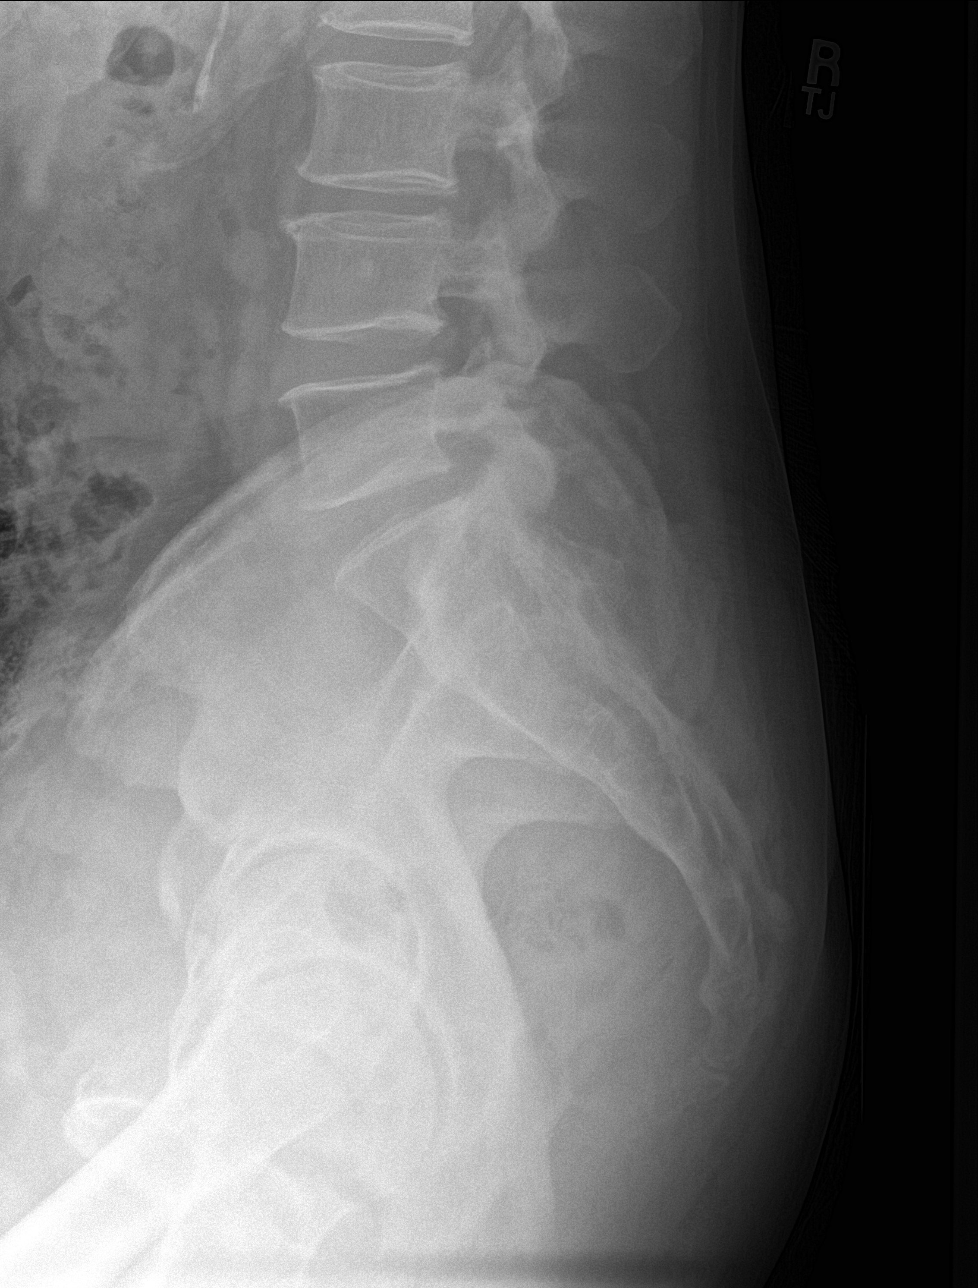

[5 of 5 positions shown; findings below may reference images not displayed]

FINDINGS: Frontal, lateral, spot lumbosacral lateral, and bilateral oblique
views were obtained. The there are 5 non-rib-bearing lumbar type
vertebral bodies. There is no fracture or spondylolisthesis. The
disc spaces appear unremarkable. There is no appreciable facet
arthropathic change.
IMPRESSION: Fracture or spondylolisthesis.  No appreciable arthropathy.

## 2018-11-08 NOTE — Progress Notes (Addendum)
Wayne Richardson T. Syndi Pua, Vang Primary [facility] and [facility] at [facility] [place], [address] Phone: [phone]   FAX: Wayne Vang - 58 y.o. male   MRN [id number]   Date of Birth: 1961/02/14  Visit Date: 11/08/2018   PCP: Miner Loffler, Vang   Referred by: Stachnik Loffler, Vang  Chief Complaint  Patient presents with   Follow-up    Back Pain   Subjective:   Wayne Vang is a 58 y.o. very pleasant male patient who presents with the following:  I saw him previously and he was having some significant lumbar radiculopathy, and a pulsed him with some steroids.  He is here following up with me again today.  His improvement is better, less than 50%, but he is having some persistent weakness in the left lower extremity.  He is experiencing foot drop on the left, and he has some unusual sensation on the left lateral lower extremity and some pain in this region.  He also has some tingling in his foot on the left.  His pain is the greatest in the left buttocks region.  He is also having some pain in the lowest part of his lumbar spine, but that is improved compared to the last exam.  He is still having difficulty rising from a seated position.  He is having trouble getting in and out of the car and going up and down stairs.  No bowel or bladder incontinence.  Some pain with walking. L lateral LE.  L toes with tingling.   L buttocks the most  Check   Foot drop on the L Big toe    11/01/2018 Last OV with Algeo Loffler, Vang  ongoing for approximately: 5 days, with chronic lbp The patient has had back pain before. The patient called in with some acute back pain and radiculopathy.  LBP on and off and low left it is really bad.  Really flared up on 10/22/2018 on.  Heating pad did not health.  Called teledoc.  3 x 4, 2 x3 d, then 1 until gone. 60 mg, 40 mg, then 20 mg.  Buttocks area had a lot of pain, then with him  hit and down past the knee.  Tingling.  No numbness.  ? Weakness. Has used some body pillow.   Tried some alleve, also. Took 5 alleve at one time this morning.   No numbness or tingling. No bowel or bladder incontinence. No focal weakness. Prior interventions: none Physical therapy: No Chiropractic manipulations: No Acupuncture: No Osteopathic manipulation: No Heat or cold: Minimal effect  Past Medical History, Surgical History, Social History, Family History, Problem List, Medications, and Allergies have been reviewed and updated if relevant.  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 LOVE) 07/27/2008   GERD 04/21/2007    Past Medical History:  Diagnosis Date   Esophageal reflux    Hiatal hernia    Low back pain    Other and unspecified hyperlipidemia    Seizure disorder [employer])    Tremor     Past Surgical History:  Procedure Laterality Date   INGUINAL HERNIA REPAIR Right 12/06/2013   Procedure:  REPAIR RIGHT INGUINAL HERNIA;  Surgeon: Wayne Wayne Vang;  Location: [facility];  Service: General;  Laterality: Right;   INGUINAL HERNIA REPAIR Left 2015  INSERTION OF MESH N/A 12/06/2013   Procedure: INSERTION OF MESH;  Surgeon: Wayne Wayne Vang;  Location: [facility];  Service: General;  Laterality: N/A;   NO PAST SURGERIES      Social History   Socioeconomic History   Marital status: Married    Spouse name: Not on file   Number of children: 1   Years of education: Not on file   Highest education level: Not on file  Occupational History   Occupation: Shop Printmaker: [employer]    Comment: Wayne Vang  Social Needs   Emergency planning/management officer strain: Not on file   Food insecurity:    Worry: Not on file    Inability: Not on file   Transportation needs:    Medical: Not on  file    Non-medical: Not on file  Tobacco Use   Smoking status: Former Smoker    Last attempt to quit: 07/21/2002    Years since quitting: 16.3   Smokeless tobacco: Never Used  Substance and Sexual Activity   Alcohol use: Yes    Comment: socially   Drug use: No   Sexual activity: Not on file  Lifestyle   Physical activity:    Days per week: Not on file    Minutes per session: Not on file   Stress: Not on file  Relationships   Social connections:    Talks on phone: Not on file    Gets together: Not on file    Attends religious service: Not on file    Active member of club or organization: Not on file    Attends meetings of clubs or organizations: Not on file    Relationship status: Not on file   Intimate partner violence:    Fear of current or ex partner: Not on file    Emotionally abused: Not on file    Physically abused: Not on file    Forced sexual activity: Not on file  Other Topics Concern   Not on file  Social History Narrative      He has been married 31+ years.   Caffeine Use: He drinks coke, tea, and coffee.   Truck Dealer    Family History  Problem Relation Age of Onset   Alzheimer's disease Mother    Cancer Father    Stroke Sister    Alcohol abuse Brother    Colon cancer Neg Hx    Esophageal cancer Neg Hx    Stomach cancer Neg Hx    Rectal cancer Neg Hx     Allergies  Allergen Reactions   Esomeprazole Magnesium     REACTION: abdominal pain   Simvastatin     Wasn't working for pt    Alum Hydroxide-Mag Carbonate     Medication interaction only    Medication list reviewed and updated in full in [facility].  GEN: no acute illness or fever CV: No chest pain or shortness of breath MSK: detailed above Neuro: neurological signs are described above ROS O/w per HPI  Objective:   BP 110/78    Pulse (!) 110    Temp 99.3 F (37.4 C) (Oral)    Ht 6' 1.5" (1.867 m)    Wt 228 lb (103.4 kg)    BMI 29.67 kg/m    GEN:  Well-developed,well-nourished,in no acute distress; alert,appropriate and cooperative throughout examination HEENT: Normocephalic and atraumatic without obvious abnormalities. Ears, externally no deformities PULM: Breathing comfortably in no respiratory  distress EXT: No clubbing, cyanosis, or edema PSYCH: Normally interactive. Cooperative during the interview. Pleasant. Friendly and conversant. Not anxious or depressed appearing. Normal, full affect.  Range of motion at  the waist: Flexion, extension, lateral bending and rotation: He is able to flex at the waist and go beyond his knees.  Extension is limited.  Lateral bending and rotation is normal.  No echymosis or edema Rises to examination table with mild difficulty Gait: minimally antalgic  Inspection/Deformity: N Paraspinus Tenderness: Bilateral L5-S1.  B Ankle Dorsiflexion (L5,4): 2+/5 B Great Toe Dorsiflexion (L5,4): 3-/5 FOOT DROP IS POSITIVE Heel Walk (L5): FOOT DROP Toe Walk (S1): WNL Rise/Squat (L4): SLOW, MODERATE PAIN  SENSORY B Medial Foot (L4): WNL B Dorsum (L5): WNL B Lateral (S1): WNL Light Touch: WNL Pinprick: WNL  REFLEXES Knee (L4): 2+ Ankle (S1): 2+  B SLR, seated: neg B SLR, supine: neg B FABER: neg B Reverse FABER: neg B Greater Troch: NT B Log Roll: neg B Stork: NT B Sciatic Notch: NT   Radiology: Dg Lumbar Spine Complete  Result Date: 11/08/2018 CLINICAL DATA:  Pain with radicular symptoms EXAM: LUMBAR SPINE - COMPLETE 4+ VIEW COMPARISON:  None. FINDINGS: Frontal, lateral, spot lumbosacral lateral, and bilateral oblique views were obtained. The there are 5 non-rib-bearing lumbar type vertebral bodies. There is no fracture or spondylolisthesis. The disc spaces appear unremarkable. There is no appreciable facet arthropathic change. IMPRESSION: Fracture or spondylolisthesis.  No appreciable arthropathy. Electronically Signed   By: Lowella Grip III M.D.   On: 11/08/2018 11:12   Mr Lumbar Spine  Wo Contrast  Result Date: 11/11/2018 CLINICAL DATA:  Onset severe low back pain with left leg pain and weakness 10/22/2018. Left foot drop. No known injury. EXAM: MRI LUMBAR SPINE WITHOUT CONTRAST TECHNIQUE: Multiplanar, multisequence MR imaging of the lumbar spine was performed. No intravenous contrast was administered. COMPARISON:  Plain films lumbar spine 11/08/2018. FINDINGS: Segmentation:  Standard. Alignment:  Maintained. Vertebrae:  Height and signal are normal. Conus medullaris and cauda equina: Conus extends to the L2 level. Conus and cauda equina appear normal. Paraspinal and other soft tissues: Parapelvic renal cyst incidentally noted. Disc levels: T11-12 is imaged in the sagittal plane only and negative. T12-L1: Negative. L1-2: Negative. L2-3: Negative. L3-4: Negative. L4-5: Mild disc bulge without central canal narrowing. There is mild bilateral foraminal narrowing. No nerve root compression. L5-S1: Shallow disc bulge without central canal or foraminal stenosis. IMPRESSION: Mild bilateral foraminal narrowing at L4-5 due to a shallow disc bulge. The central canal is open. Shallow disc bulge L5-S1 without central canal or foraminal stenosis. Electronically Signed   By: Inge Rise M.D.   On: 11/11/2018 08:04    Assessment and Plan:   Left lumbar radiculopathy - Plan: DG Lumbar Spine Complete, MR Lumbar Spine Wo Contrast  Other intervertebral disc degeneration, lumbar region - Plan: MR Lumbar Spine Wo Contrast  Foot drop, left - Plan: MR Lumbar Spine Wo Contrast  Abnormal neurological exam - Plan: MR Lumbar Spine Wo Contrast  Concerning examination with the patient with ongoing left lumbar radiculopathy with foot drop on the left.  He also has markedly abnormal extension at the great toe on the left.  This is notably decreased strength with foot dorsiflexion as well as great toe dorsiflexion on the left foot.  Clinical concern of left foraminal neural compression versus spinal cord  compression.  Obtain an MRI of the lumbar spine without contrast to evaluate such given the setting of foot drop and abnormal  neurological exam.  Addendum:  11/15/2018 Reviewed MRI.  No acute emergencies but he is still having L sided foot drop and significant pain.  With foot drop, I would like to get Spine involved to get their opinion given concern for nerve impairment.   Follow-up: No follow-ups on file.  Orders Placed This Encounter  Procedures   DG Lumbar Spine Complete   MR Lumbar Spine Wo Contrast    Signed,  Giani Betzold T. Aadith Raudenbush, Vang   Outpatient Encounter Medications as of 11/08/2018  Medication Sig   Aspirin-Acetaminophen-Caffeine (GOODY HEADACHE PO) Take by mouth as needed.   cyclobenzaprine (FLEXERIL) 10 MG tablet Take 1 tablet (10 mg total) by mouth at bedtime.   lansoprazole (PREVACID) 30 MG capsule TAKE 1 CAPSULE BY MOUTH EVERY DAY   meclizine (ANTIVERT) 25 MG tablet Take 25 mg by mouth 3 (three) times daily as needed for dizziness.   phenytoin (DILANTIN) 100 MG ER capsule Take 2 capsules (200 mg total) by mouth daily AND 3 capsules (300 mg total) at bedtime.   sildenafil (VIAGRA) 25 MG tablet Take 1-2 tablets (25-50 mg total) by mouth daily as needed for erectile dysfunction.   [DISCONTINUED] predniSONE (DELTASONE) 20 MG tablet    [DISCONTINUED] predniSONE (DELTASONE) 20 MG tablet 2 tabs po daily for 5 days, then 1 tab po daily for 5 days   No facility-administered encounter medications on file as of 11/08/2018.

## 2018-11-11 ENCOUNTER — Other Ambulatory Visit: Payer: Self-pay

## 2018-11-11 ENCOUNTER — Ambulatory Visit
Admission: RE | Admit: 2018-11-11 | Discharge: 2018-11-11 | Disposition: A | Discharge status: Home / Self Care | Payer: 59 | Source: Ambulatory Visit | Attending: Family Medicine | Admitting: Family Medicine

## 2018-11-11 DIAGNOSIS — M21372 Foot drop, left foot: Secondary | ICD-10-CM

## 2018-11-11 DIAGNOSIS — R2990 Unspecified symptoms and signs involving the nervous system: Secondary | ICD-10-CM

## 2018-11-11 DIAGNOSIS — M5416 Radiculopathy, lumbar region: Secondary | ICD-10-CM

## 2018-11-11 DIAGNOSIS — M5136 Other intervertebral disc degeneration, lumbar region: Secondary | ICD-10-CM

## 2018-11-15 NOTE — Addendum Note (Signed)
Addended by: Rigor Loffler on: 11/15/2018 04:19 PM   Modules accepted: Orders

## 2018-11-22 ENCOUNTER — Telehealth: Payer: Self-pay | Admitting: Family Medicine

## 2018-11-22 NOTE — Telephone Encounter (Signed)
Wayne Vang notified work note is ready to be picked up at the front desk.

## 2018-11-22 NOTE — Telephone Encounter (Signed)
Patient wife called and patient said Dr.Copland wanted patient to call Dr.Copland  today to remind him to do work note for patient to return to work tomorrow-light duty. Please call patient when letter is ready for pick up.

## 2018-11-22 NOTE — Telephone Encounter (Signed)
done

## 2019-01-04 ENCOUNTER — Ambulatory Visit: Payer: PRIVATE HEALTH INSURANCE | Admitting: Family Medicine

## 2019-01-04 ENCOUNTER — Ambulatory Visit: Payer: PRIVATE HEALTH INSURANCE | Admitting: Adult Health

## 2019-01-06 ENCOUNTER — Telehealth: Payer: Self-pay

## 2019-01-06 NOTE — Telephone Encounter (Signed)
Unable to get in contact with the patient to convert their office appt with Amy on 01/10/2019 into a mychart video visit. I left a voicemail asking the patient to return my call. Office number was provided.    If patient calls back please convert their office visit into a mychart video visit.

## 2019-01-07 ENCOUNTER — Encounter: Payer: Self-pay | Admitting: Gastroenterology

## 2019-01-10 ENCOUNTER — Ambulatory Visit: Payer: PRIVATE HEALTH INSURANCE | Admitting: Family Medicine

## 2019-02-14 ENCOUNTER — Encounter: Payer: Self-pay | Admitting: Family Medicine

## 2019-02-15 ENCOUNTER — Telehealth: Payer: Self-pay | Admitting: *Deleted

## 2019-02-15 NOTE — Telephone Encounter (Signed)
Called patient re: my chart message to reschedule FU. I spoke with wife Barnett Applebaum, on [place] and we rescheduled for Sept at her request.

## 2019-02-15 NOTE — Telephone Encounter (Signed)
Will cancel 02-16-19 appt .

## 2019-02-16 ENCOUNTER — Ambulatory Visit: Payer: PRIVATE HEALTH INSURANCE | Admitting: Family Medicine

## 2019-03-19 ENCOUNTER — Other Ambulatory Visit: Payer: Self-pay | Admitting: Diagnostic Neuroimaging

## 2019-03-23 ENCOUNTER — Encounter: Payer: Self-pay | Admitting: Family Medicine

## 2019-03-23 ENCOUNTER — Other Ambulatory Visit: Payer: Self-pay

## 2019-03-23 ENCOUNTER — Ambulatory Visit: Payer: PRIVATE HEALTH INSURANCE | Admitting: Family Medicine

## 2019-03-23 VITALS — BP 121/83 | HR 94 | Temp 98.4°F | Ht 75.0 in | Wt 232.0 lb

## 2019-03-23 DIAGNOSIS — G40109 Localization-related (focal) (partial) symptomatic epilepsy and epileptic syndromes with simple partial seizures, not intractable, without status epilepticus: Secondary | ICD-10-CM | POA: Diagnosis not present

## 2019-03-23 NOTE — Progress Notes (Signed)
PATIENT: Wayne Vang DOB: Aug 28, 1960  REASON FOR VISIT: follow up HISTORY FROM: patient  Chief Complaint  Patient presents with  . Follow-up    Room 1,alone. seizure f/u. "everything is good."     HISTORY OF PRESENT ILLNESS: Today 03/23/19 Wayne Vang is a 58 y.o. male here today for follow up for seizure disorder. He continues phenyotoin 200 in am and 300 in pm. He denies seizure activity. He is tolerating medication well with no adverse effects.   HISTORY: (copied from Dr Gladstone Lighter note on 12/28/2017)  UPDATE (12/28/17, VRP): Since last visit, doing well. Tolerating dilantin. No alleviating or aggravating factors. Tried to lower to 200mg  twice a day, but felt "funny" and now back to 200 / 300.   UPDATE 12/23/16: Since last visit, no seizures. Tolerating dilantin. Having more joint aches and pain in the last few years. No other issues.   UPDATE 12/05/15: Since last visit, doing about the same. No seizures. Now off levetiracetam. Continues on phenytoin 200 / 300.   UPDATE 06/04/15: Doing well. No seizures. No vertigo attacks. Tolerating meds.   UPDATE 03/15/14 (LL): since last visit patient has been doing well, reports no seizures. Tolerating Keppra and Dilantin without known side effects. Still with intermittent episodes of vertigo, unchanged. No complaints today.  UPDATE 12/07/12 (VRP): since last visit patient doing well. No more seizures. Last seizure was in 2006 in nighttime sleep. His has intermittent episodes of spinning sensation, vertigo, lasting for a few minutes of time. Meclizine seems to help.   PRIOR HPI (11/03/12, Dr. Erling Cruz): 58 year old right-handed white married male with a past history of recurrent dizziness beginning in 1985. EEG 08/09/1983 showed epileptiform activity bilaterally in the frontotemporal regions, more prominent on the right. His first generalized seizure was 08/04/1983 and the second occurred 09/07/1983. [name] has been on Dilantin or phenytoin  medication since 09/07/1993. His last generalized major motor seizure was 08/13/2004. At that time Keppra was added to his regiment and he has not had a seizure since. Medications are phenytoin sodium capsules 100 mg 2 in the morning and 3 at night and levitercetam 500 mg one half tablet twice per day. He is also on protonix 40 mg once daily. He discontinued simvastatin because of muscle pain. 11/27/2009 he was at work and developed nausea without vomiting. He had to sit down. He felt as if he had to focus on the wall and be very still. He was seen [facility] emergency room where a Chest x-ray was normal and subsequent stress cardiac test was normal. CBC, basic metabolic panel, CPK, and cardiac markers were normal. A phenytoin level was 15.4. He has not had recurrent symptoms. His last EEG 05/19/2010 was normal. His last MRI of the brain 08/21/2004 showed no significant abnormalities. 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 He denies macropsia, micropsia, strange odors or tastes. The patient has headaches behind his eyes. Over the last 4 months he has recurrent dizziness. His has tingling in his left arm. At one point he was on meclizine, but is no longer taking it. He feels dizzy like "I'm on a boat". Sitting down or lying down helps. There is a sensation of "nearly spinning".The symptoms last 30 minutes to an hour. They can occur every 2-3 days. He is treating them with Dramamine 25 mg per day. He describes a lightheaded sensation or pressure sensation in the eyes.    REVIEW OF SYSTEMS: Out of  a complete 14 system review of symptoms, the patient complains only of the following symptoms, headaches, memory loss, and all other reviewed systems are negative.  ALLERGIES: Allergies  Allergen Reactions  . Esomeprazole Magnesium     REACTION: abdominal pain  . Simvastatin     Wasn't working for pt   . Alum Hydroxide-Mag Carbonate     Medication interaction only     HOME MEDICATIONS: Outpatient Medications Prior to Visit  Medication Sig Dispense Refill  . Aspirin-Acetaminophen-Caffeine (GOODY HEADACHE PO) Take by mouth as needed.    . cyclobenzaprine (FLEXERIL) 10 MG tablet Take 1 tablet (10 mg total) by mouth at bedtime. 30 tablet 1  . lansoprazole (PREVACID) 30 MG capsule TAKE 1 CAPSULE BY MOUTH EVERY DAY 90 capsule 3  . meclizine (ANTIVERT) 25 MG tablet Take 25 mg by mouth 3 (three) times daily as needed for dizziness.    . phenytoin (DILANTIN) 100 MG ER capsule TAKE 2 CAPSULES BY MOUTH EVERY MORNING AND TAKE 3 CAPSULES AT BEDTIME 450 capsule 4  . sildenafil (VIAGRA) 25 MG tablet Take 1-2 tablets (25-50 mg total) by mouth daily as needed for erectile dysfunction. 10 tablet 5   No facility-administered medications prior to visit.     PAST MEDICAL HISTORY: Past Medical History:  Diagnosis Date  . Esophageal reflux   . Hiatal hernia   . Low back pain   . Other and unspecified hyperlipidemia   . Seizure disorder ([place])   . Tremor     PAST SURGICAL HISTORY: Past Surgical History:  Procedure Laterality Date  . INGUINAL HERNIA REPAIR Right 12/06/2013   Procedure:  REPAIR RIGHT INGUINAL HERNIA;  Surgeon: Joyice Faster. Cornett, MD;  Location: [place];  Service: General;  Laterality: Right;  . INGUINAL HERNIA REPAIR Left 2015  . INSERTION OF MESH N/A 12/06/2013   Procedure: INSERTION OF MESH;  Surgeon: Joyice Faster. Cornett, MD;  Location: [place];  Service: General;  Laterality: N/A;  . NO PAST SURGERIES      FAMILY HISTORY: Family History  Problem Relation Age of Onset  . Alzheimer's disease Mother   . Cancer Father   . Stroke Sister   . Alcohol abuse Brother   . Colon cancer Neg Hx   . Esophageal cancer Neg Hx   . Stomach cancer Neg Hx   . Rectal cancer Neg Hx     SOCIAL HISTORY: Social History   Socioeconomic History  . Marital status: Married    Spouse name: Not on file  . Number of children: 1  .  Years of education: Not on file  . Highest education level: Not on file  Occupational History  . Occupation: Shop Printmaker: [employer]    Comment: [employer]  Social Needs  . Financial resource strain: Not on file  . Food insecurity    Worry: Not on file    Inability: Not on file  . Transportation needs    Medical: Not on file    Non-medical: Not on file  Tobacco Use  . Smoking status: Former Smoker    Quit date: 07/21/2002    Years since quitting: 16.6  . Smokeless tobacco: Never Used  Substance and Sexual Activity  . Alcohol use: Yes    Comment: socially  . Drug use: No  . Sexual activity: Not on file  Lifestyle  . Physical activity    Days per week: Not on file    Minutes  per session: Not on file  . Stress: Not on file  Relationships  . Social Herbalist on phone: Not on file    Gets together: Not on file    Attends religious service: Not on file    Active member of club or organization: Not on file    Attends meetings of clubs or organizations: Not on file    Relationship status: Not on file  . Intimate partner violence    Fear of current or ex partner: Not on file    Emotionally abused: Not on file    Physically abused: Not on file    Forced sexual activity: Not on file  Other Topics Concern  . Not on file  Social History Narrative      He has been married 31+ years.   Caffeine Use: He drinks coke, tea, and coffee.   Truck Dealer      PHYSICAL EXAM  Vitals:   03/23/19 1539  BP: 121/83  Pulse: 94  Temp: 98.4 F (36.9 C)  Weight: 232 lb (105.2 kg)  Height: 6\' 3"  (1.905 m)   Body mass index is 29 kg/m.  Generalized: Well developed, in no acute distress  Cardiology: normal rate and rhythm, no murmur noted Neurological examination  Mentation: Alert oriented to time, place, history taking. Follows all commands speech and language fluent Cranial nerve II-XII: Pupils were equal round reactive to light.  Extraocular movements were full, visual field were full on confrontational test. Facial sensation and strength were normal. Uvula tongue midline. Head turning and shoulder shrug  were normal and symmetric. Motor: The motor testing reveals 5 over 5 strength of all 4 extremities. Good symmetric motor tone is noted throughout.   Coordination: Cerebellar testing reveals good finger-nose-finger and heel-to-shin bilaterally.  Gait and station: Gait is normal. .   DIAGNOSTIC DATA (LABS, IMAGING, TESTING) - I reviewed patient records, labs, notes, testing and imaging myself where available.  No flowsheet data found.   Lab Results  Component Value Date   WBC 6.6 09/01/2018   HGB 15.6 09/01/2018   HCT 45.0 09/01/2018   MCV 89.1 09/01/2018   PLT 180.0 09/01/2018      Component Value Date/Time   NA 142 09/01/2018 1540   NA 145 (H) 12/05/2015 1601   K 4.5 09/01/2018 1540   CL 102 09/01/2018 1540   CO2 31 09/01/2018 1540   GLUCOSE 81 09/01/2018 1540   BUN 13 09/01/2018 1540   BUN 12 12/05/2015 1601   CREATININE 1.04 09/01/2018 1540   CALCIUM 9.9 09/01/2018 1540   PROT 7.2 09/01/2018 1540   PROT 7.3 12/05/2015 1601   ALBUMIN 5.2 09/01/2018 1540   ALBUMIN 5.0 12/05/2015 1601   AST 21 09/01/2018 1540   ALT 17 09/01/2018 1540   ALKPHOS 77 09/01/2018 1540   BILITOT 0.7 09/01/2018 1540   BILITOT 0.5 12/05/2015 1601   GFRNONAA 95 12/05/2015 1601   GFRAA 110 12/05/2015 1601   Lab Results  Component Value Date   CHOL 251 (H) 09/01/2018   HDL 70.50 09/01/2018   LDLCALC 160 (H) 09/01/2018   LDLDIRECT 194.1 04/27/2009   TRIG 105.0 09/01/2018   CHOLHDL 4 09/01/2018   Lab Results  Component Value Date   HGBA1C 5.1 09/01/2018   No results found for: [id number] Lab Results  Component Value Date   TSH 0.88 09/03/2010     ASSESSMENT AND PLAN 58 y.o. year old male  has a past medical history of Esophageal reflux,  Hiatal hernia, Low back pain, Other and unspecified hyperlipidemia,  Seizure disorder ([place]), and Tremor. here with     ICD-10-CM   1. 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 We will continue current treatment plan. He was advised to take medication daily without missed doses. We will update labs today. He will follow up in 1 year, sooner if needed.    Orders Placed This Encounter  Procedures  . Phenytoin Level, Total     No orders of the defined types were placed in this encounter.     I spent 15 minutes with the patient. 50% of this time was spent counseling and educating patient on plan of care and medications.    Debbora Presto, FNP-C 03/23/2019, 4:08 PM [facility] [address], [place], [place] [address] [phone]

## 2019-03-23 NOTE — Patient Instructions (Signed)
Continue Dilantin as prescribed   Follow up in 1 year, sooner   Seizure, Adult A seizure is a sudden burst of abnormal electrical activity in the brain. Seizures usually last from 30 seconds to 2 minutes. They can cause many different symptoms. Usually, seizures are not harmful unless they last a long time. What are the causes? Common causes of this condition include:  Fever or infection.  Conditions that affect the brain, such as: ? A brain abnormality that you were born with. ? A brain or head injury. ? Bleeding in the brain. ? A tumor. ? Stroke. ? Brain disorders such as autism or cerebral palsy.  Low blood sugar.  Conditions that are passed from parent to child (are inherited).  Problems with substances, such as: ? Having a reaction to a drug or a medicine. ? Suddenly stopping the use of a substance (withdrawal). In some cases, the cause may not be known. A person who has repeated seizures over time without a clear cause has a condition called epilepsy. What increases the risk? You are more likely to get this condition if you have:  A family history of epilepsy.  Had a seizure in the past.  A brain disorder.  A history of head injury, lack of oxygen at birth, or strokes. What are the signs or symptoms? There are many types of seizures. The symptoms vary depending on the type of seizure you have. Examples of symptoms during a seizure include:  Shaking (convulsions).  Stiffness in the body.  Passing out (losing consciousness).  Head nodding.  Staring.  Not responding to sound or touch.  Loss of bladder control and bowel control. Some people have symptoms right before and right after a seizure happens. Symptoms before a seizure may include:  Fear.  Worry (anxiety).  Feeling like you may vomit (nauseous).  Feeling like the room is spinning (vertigo).  Feeling like you saw or heard something before (dj vu).  Odd tastes or smells.  Changes in how  you see. You may see flashing lights or spots. Symptoms after a seizure happens can include:  Confusion.  Sleepiness.  Headache.  Weakness on one side of the body. How is this treated? Most seizures will stop on their own in under 5 minutes. In these cases, no treatment is needed. Seizures that last longer than 5 minutes will usually need treatment. Treatment can include:  Medicines given through an IV tube.  Avoiding things that are known to cause your seizures. These can include medicines that you take for another condition.  Medicines to treat epilepsy.  Surgery to stop the seizures. This may be needed if medicines do not help. Follow these instructions at home: Medicines  Take over-the-counter and prescription medicines only as told by your doctor.  Do not eat or drink anything that may keep your medicine from working, such as alcohol. Activity  Do not do any activities that would be dangerous if you had another seizure, like driving or swimming. Wait until your doctor says it is safe for you to do them.  If you live in the [place] ask your local DMV (department of motor vehicles) when you can drive.  Get plenty of rest. Teaching others Teach friends and family what to do when you have a seizure. They should:  Lay you on the ground.  Protect your head and body.  Loosen any tight clothing around your neck.  Turn you on your side.  Not hold you down.  Not put anything  into your mouth.  Know whether or not you need emergency care.  Stay with you until you are better.  General instructions  Contact your doctor each time you have a seizure.  Avoid anything that gives you seizures.  Keep a seizure diary. Write down: ? What you think caused each seizure. ? What you remember about each seizure.  Keep all follow-up visits as told by your doctor. This is important. Contact a doctor if:  You have another seizure.  You have seizures more often.  There is any  change in what happens during your seizures.  You keep having seizures with treatment.  You have symptoms of being sick or having an infection. Get help right away if:  You have a seizure that: ? Lasts longer than 5 minutes. ? Is different than seizures you had before. ? Makes it harder to breathe. ? Happens after you hurt your head.  You have any of these symptoms after a seizure: ? Not being able to speak. ? Not being able to use a part of your body. ? Confusion. ? A bad headache.  You have two or more seizures in a row.  You do not wake up right after a seizure.  You get hurt during a seizure. These symptoms may be an emergency. Do not wait to see if the symptoms will go away. Get medical help right away. Call your local emergency services (911 in the [place] Do not drive yourself to the hospital. Summary  Seizures usually last from 30 seconds to 2 minutes. Usually, they are not harmful unless they last a long time.  Do not eat or drink anything that may keep your medicine from working, such as alcohol.  Teach friends and family what to do when you have a seizure.  Contact your doctor each time you have a seizure. This information is not intended to replace advice given to you by your health care provider. Make sure you discuss any questions you have with your health care provider. Document Released: 12/24/2007 Document Revised: 09/24/2018 Document Reviewed: 09/24/2018 Elsevier Patient Education  [place].

## 2019-03-24 ENCOUNTER — Telehealth: Payer: Self-pay

## 2019-03-24 LAB — PHENYTOIN LEVEL, TOTAL: Phenytoin (Dilantin), Serum: 18 ug/mL (ref 10.0–20.0)

## 2019-03-24 NOTE — Telephone Encounter (Signed)
Unable to get in contact with the patient. LVM letting him know that his labs were normal. Office number provided in case he has any further questions.

## 2019-03-24 NOTE — Telephone Encounter (Signed)
-----   Message from Debbora Presto, NP sent at 03/24/2019  8:00 AM EDT ----- Dilantin level was normal. Follow up next year.

## 2019-03-29 NOTE — Progress Notes (Signed)
I reviewed note and agree with plan.   VIKRAM R. PENUMALLI, MD 03/29/2019, 11:53 AM Certified in Neurology, Neurophysiology and Neuroimaging  [facility] [address], [address] [place], [place] [address] [phone]  

## 2019-05-02 ENCOUNTER — Other Ambulatory Visit: Payer: Self-pay

## 2019-05-02 ENCOUNTER — Telehealth: Payer: Self-pay | Admitting: Family Medicine

## 2019-05-02 DIAGNOSIS — Z20822 Contact with and (suspected) exposure to covid-19: Secondary | ICD-10-CM

## 2019-05-02 NOTE — Telephone Encounter (Signed)
Patient called stating he was going today to be tested for Covid. His work had positive cases and they advised him to be tested as soon as he could Patient stated he was going to the testing site in [place]

## 2019-05-02 NOTE — Telephone Encounter (Signed)
ok 

## 2019-05-03 LAB — NOVEL CORONAVIRUS, NAA: SARS-CoV-2, NAA: NOT DETECTED

## 2019-05-12 ENCOUNTER — Other Ambulatory Visit: Payer: Self-pay

## 2019-05-12 ENCOUNTER — Ambulatory Visit (INDEPENDENT_AMBULATORY_CARE_PROVIDER_SITE_OTHER): Payer: 59 | Admitting: Family Medicine

## 2019-05-12 ENCOUNTER — Encounter: Payer: Self-pay | Admitting: Family Medicine

## 2019-05-12 VITALS — Ht 73.5 in

## 2019-05-12 DIAGNOSIS — M791 Myalgia, unspecified site: Secondary | ICD-10-CM

## 2019-05-12 DIAGNOSIS — R509 Fever, unspecified: Secondary | ICD-10-CM

## 2019-05-12 DIAGNOSIS — Z20822 Contact with and (suspected) exposure to covid-19: Secondary | ICD-10-CM

## 2019-05-12 MED ORDER — DOXYCYCLINE HYCLATE 100 MG PO TABS: 100.0000 mg | ORAL_TABLET | Freq: Two times a day (BID) | ORAL | 0 refills | Status: AC

## 2019-05-12 NOTE — Progress Notes (Signed)
[name] T. Torunn Chancellor, MD Primary Care and [place] at [facility] [place], [address] Phone: [phone]  FAX: [place] - 58 y.o. male  MRN [id number]  Date of Birth: May 31, 1961  Visit Date: 05/12/2019  PCP: Sulak Loffler, MD  Referred by: Capshaw Loffler, MD Chief Complaint  Patient presents with  . Nasal Congestion    Neg Covid Test Last Week-Seen by tele doc and put on amoxicillin  . Generalized Body Aches  . Fever  . Chills   Virtual Visit via Video Note:  I connected with  Wayne Vang on 05/12/2019  8:40 AM EDT by a video enabled telemedicine application and verified that I am speaking with the correct person using two identifiers.   Location patient: home computer, tablet, or smartphone Location provider: work or home office Consent: Verbal consent directly obtained from [employer]. Persons participating in the virtual visit: patient, provider  I discussed the limitations of evaluation and management by telemedicine and the availability of in person appointments. The patient expressed understanding and agreed to proceed.  Interactive audio and video telecommunications were attempted between this provider and patient, however failed, due to patient having technical difficulties OR patient did not have access to video capability.  We continued and completed visit with audio only.   History of Present Illness:  F/u telehealth appt, covid negative.  St, chills, body aches.  Fever. clar-d, robitussin. Some congestion. So sore, muscles skin. Lbp.  Nausea.   He had a negative COVID-19 test last week, and was placed on amoxicillin from telehealth medicine.  He still continues to have generalized body aches that are quite bad, chills, and he is intermittently had some fever.  He has no known exposure, but his wife does have some similar symptoms now.  Review of Systems as above: See  pertinent positives and pertinent negatives per HPI No acute distress verbally  Past Medical History, Surgical History, Social History, Family History, Problem List, Medications, and Allergies have been reviewed and updated if relevant.   Observations/Objective/Exam:  An attempt was made to discern vital signs over the phone and per patient if applicable and possible.   General:    Alert, Oriented, appears well and in no acute distress HEENT:     Atraumatic, conjunctiva clear, no obvious abnormalities on inspection of external nose and ears.  Neck:    Normal movements of the head and neck Pulmonary:     On inspection no signs of respiratory distress, breathing rate appears normal, no obvious gross SOB, gasping or wheezing Cardiovascular:    No obvious cyanosis Musculoskeletal:    Moves all visible extremities without noticeable abnormality Psych / Neurological:     Pleasant and cooperative, no obvious depression or anxiety, speech and thought processing grossly intact  Assessment and Plan:    ICD-10-CM   1. Fever and chills  R50.9   2. Myalgia  M79.10    Level of Medical Decision-Making in this case is Moderate.   Unclear etiology, certainly could be influenza.  With patient living here in [place], tickborne illnesses also certainly possible.  I am to place the patient on doxycycline.  He would like to get another COVID-19 test, and I think that that is entirely reasonable.  I discussed the assessment and treatment plan with the patient. The patient was provided an opportunity to ask questions and all were answered. The patient agreed with the plan and  demonstrated an understanding of the instructions.   The patient was advised to call back or seek an in-person evaluation if the symptoms worsen or if the condition fails to improve as anticipated.  Follow-up: prn unless noted otherwise below No follow-ups on file.  Meds ordered this encounter  Medications  .  doxycycline (VIBRA-TABS) 100 MG tablet    Sig: Take 1 tablet (100 mg total) by mouth 2 (two) times daily for 10 days.    Dispense:  20 tablet    Refill:  0   No orders of the defined types were placed in this encounter.   Signed,  Maud Deed. Semaya Vida, MD

## 2019-05-14 LAB — NOVEL CORONAVIRUS, NAA: SARS-CoV-2, NAA: DETECTED — AB

## 2019-05-15 ENCOUNTER — Encounter: Payer: Self-pay | Admitting: Family Medicine

## 2019-05-15 NOTE — Telephone Encounter (Signed)
Can you help call HD? Pos covid

## 2019-05-15 NOTE — Telephone Encounter (Signed)
-----   Message from Darreld Mclean, MD sent at 05/15/2019 12:02 PM EDT ----- Hi- please ask Butch Penny to contact the HD about his positive covid on Monday thank you!

## 2019-05-16 ENCOUNTER — Encounter: Payer: Self-pay | Admitting: *Deleted

## 2019-05-16 NOTE — Telephone Encounter (Signed)
Health Department Disease Surveillance form faxed to [facility].

## 2019-05-19 ENCOUNTER — Telehealth: Payer: Self-pay | Admitting: *Deleted

## 2019-05-19 NOTE — Telephone Encounter (Signed)
Called to check on Wayne Vang who is on my Respiratory Illness Log.  Patient sounded much better today.  Denies any SOB or Fever.   He states he is mostly fatigued and tends to wake up in the morning with a headache but other than than he is feeling a lot better.  FYI to Dr. Lorelei Pont.

## 2019-10-13 ENCOUNTER — Other Ambulatory Visit: Payer: Self-pay | Admitting: Family Medicine

## 2019-10-14 NOTE — Telephone Encounter (Signed)
Please schedule CPE with fasting labs with Dr. Copland. 

## 2019-10-14 NOTE — Telephone Encounter (Signed)
Left message with person answered phone to have pt to call office.  Pt was at work

## 2019-12-06 ENCOUNTER — Encounter: Payer: Self-pay | Admitting: Family Medicine

## 2019-12-06 NOTE — Telephone Encounter (Signed)
Unable to reach pt. Mailed letter. 

## 2020-01-07 ENCOUNTER — Other Ambulatory Visit: Payer: Self-pay | Admitting: Family Medicine

## 2020-02-06 ENCOUNTER — Other Ambulatory Visit: Payer: Self-pay | Admitting: Family Medicine

## 2020-02-08 ENCOUNTER — Ambulatory Visit: Payer: 59 | Admitting: Family Medicine

## 2020-02-22 ENCOUNTER — Other Ambulatory Visit: Payer: Self-pay

## 2020-02-22 ENCOUNTER — Encounter: Payer: Self-pay | Admitting: Family Medicine

## 2020-02-22 ENCOUNTER — Ambulatory Visit (INDEPENDENT_AMBULATORY_CARE_PROVIDER_SITE_OTHER): Payer: 59 | Admitting: Family Medicine

## 2020-02-22 VITALS — BP 138/70 | HR 86 | Temp 98.1°F | Ht 74.0 in | Wt 224.0 lb

## 2020-02-22 DIAGNOSIS — K219 Gastro-esophageal reflux disease without esophagitis: Secondary | ICD-10-CM

## 2020-02-22 MED ORDER — LANSOPRAZOLE 30 MG PO CPDR: 30.0000 mg | DELAYED_RELEASE_CAPSULE | Freq: Every day | ORAL | 3 refills | Status: DC

## 2020-02-22 NOTE — Progress Notes (Signed)
Gurshaan Matsuoka T. Siah Kannan, MD, [place] at [facility] [place], [address]  Phone: [phone]  FAX: [place] - 59 y.o. male  MRN [id number]  Date of Birth: [birth year]  Date: 02/22/2020  PCP: Thong Loffler, MD  Referral: Moroz Loffler, MD  Chief Complaint  Patient presents with  . Medication Refill    Lansoprazole    This visit occurred during the SARS-CoV-2 public health emergency.  Safety protocols were in place, including screening questions prior to the visit, additional usage of staff PPE, and extensive cleaning of exam room while observing appropriate contact time as indicated for disinfecting solutions.   Subjective:   Wayne Vang is a 59 y.o. very pleasant male patient with Body mass index is 28.76 kg/m. who presents with the following:  GERD.  See below  Review of Systems is noted in the HPI, as appropriate  Objective:   BP 138/70   Pulse 86   Temp 98.1 F (36.7 C) (Temporal)   Ht 6\' 2"  (1.88 m)   Wt 224 lb (101.6 kg)   SpO2 96%   BMI 28.76 kg/m   GEN: No acute distress; alert,appropriate. PULM: Breathing comfortably in no respiratory distress PSYCH: Normally interactive.  ABD: S, NT, ND, + BS, No rebound, No HSM   Laboratory and Imaging Data:  Assessment and Plan:     ICD-10-CM   1. Gastroesophageal reflux disease, unspecified whether esophagitis present  K21.9    Total encounter time: 10 minutes. This includes total time spent on the day of encounter.  He is doing fine and stable in terms of his reflux.  He did not want to do a physical today and he only wanted a medication refill.  He is otherwise doing fine and his symptoms are stable.  Follow-up as needed for health maintenance or other issues.  Follow-up: No follow-ups on file.  Meds ordered this encounter  Medications  . lansoprazole (PREVACID) 30 MG capsule    Sig:  Take 1 capsule (30 mg total) by mouth daily before breakfast.    Dispense:  90 capsule    Refill:  3   Medications Discontinued During This Encounter  Medication Reason  . cyclobenzaprine (FLEXERIL) 10 MG tablet Patient Preference  . lansoprazole (PREVACID) 30 MG capsule Reorder   No orders of the defined types were placed in this encounter.   Signed,  Maud Deed. Lacye Mccarn, MD   Outpatient Encounter Medications as of 02/22/2020  Medication Sig  . Aspirin-Acetaminophen-Caffeine (GOODY HEADACHE PO) Take by mouth as needed.  Marland Kitchen Dextromethorphan-guaiFENesin (ROBITUSSIN DM PO) Take by mouth.  . fluticasone (FLONASE) 50 MCG/ACT nasal spray Place 1 spray into both nostrils as needed.   . lansoprazole (PREVACID) 30 MG capsule Take 1 capsule (30 mg total) by mouth daily before breakfast.  . loratadine-pseudoephedrine (CLARITIN-D 24-HOUR) 10-240 MG 24 hr tablet Take 1 tablet by mouth as needed.   . meclizine (ANTIVERT) 25 MG tablet Take 25 mg by mouth 3 (three) times daily as needed for dizziness.  . phenytoin (DILANTIN) 100 MG ER capsule TAKE 2 CAPSULES BY MOUTH EVERY MORNING AND TAKE 3 CAPSULES AT BEDTIME  . sildenafil (VIAGRA) 25 MG tablet Take 1-2 tablets (25-50 mg total) by mouth daily as needed for erectile dysfunction.  . [DISCONTINUED] lansoprazole (PREVACID) 30 MG capsule TAKE 1 CAPSULE BY MOUTH EVERY DAY  . [DISCONTINUED] cyclobenzaprine (FLEXERIL) 10 MG tablet Take 1  tablet (10 mg total) by mouth at bedtime.   No facility-administered encounter medications on file as of 02/22/2020.

## 2020-03-27 ENCOUNTER — Encounter: Payer: Self-pay | Admitting: Family Medicine

## 2020-03-27 ENCOUNTER — Other Ambulatory Visit: Payer: Self-pay

## 2020-03-27 ENCOUNTER — Ambulatory Visit: Payer: PRIVATE HEALTH INSURANCE | Admitting: Family Medicine

## 2020-03-27 VITALS — BP 114/70 | HR 76 | Ht 74.0 in | Wt 225.8 lb

## 2020-03-27 DIAGNOSIS — G40109 Localization-related (focal) (partial) symptomatic epilepsy and epileptic syndromes with simple partial seizures, not intractable, without status epilepticus: Secondary | ICD-10-CM | POA: Diagnosis not present

## 2020-03-27 MED ORDER — PHENYTOIN SODIUM EXTENDED 100 MG PO CAPS: ORAL_CAPSULE | ORAL | 4 refills | Status: DC

## 2020-03-27 NOTE — Progress Notes (Signed)
PATIENT: Wayne Vang DOB: 08-16-1960  REASON FOR VISIT: follow up HISTORY FROM: patient  Chief Complaint  Patient presents with  . Follow-up    65yr f/u, states he has been doing well since las visit.   Marland Kitchen room 3    alone     HISTORY OF PRESENT ILLNESS: Today 03/27/20 Wayne Vang is a 59 y.o. male here today for follow up for seizures. He continues phenytoin 200mg  in the mornings and 300mg  at bedtime. He is tolerating medication well. No recent seizure activity. Last seizure in 2006. He tried to wean phenytoin in 2018 but reports "feeling funny" and dose was resumed.   He is followed routinely by PCP. He recently had a CPE. He reports that labs were normal. He is not sure if phenytoin levels were checked. He thinks he has had a recent DEXA but is unsure. He is taking a calcium supplement but unsure if it has vitamin D.     HISTORY: (copied from my note on 03/23/2019)  Wayne Vang is a 59 y.o. male here today for follow up for seizure disorder. He continues phenyotoin 200 in am and 300 in pm. He denies seizure activity. He is tolerating medication well with no adverse effects.   HISTORY: (copied from Dr Gladstone Lighter note on 12/28/2017)  UPDATE (12/28/17, VRP): Since last visit, doingwell. Toleratingdilantin. No alleviating or aggravating factors.Tried to lower to 200mg twice a day, but felt "funny" and now back to 200 / 300.  UPDATE 12/23/16: Since last visit, no seizures. Tolerating dilantin. Having more joint aches and pain in the last few years. No other issues.   UPDATE 12/05/15: Since last visit, doing about the same. No seizures. Now off levetiracetam. Continues on phenytoin 200 / 300.   UPDATE 06/04/15: Doing well. No seizures. No vertigo attacks. Tolerating meds.   UPDATE 03/15/14 (LL): since last visit patient has been doing well, reports no seizures. Tolerating Keppra and Dilantin without known side effects. Still with intermittent episodes of vertigo,  unchanged. No complaints today.  UPDATE 12/07/12 (VRP): since last visit patient doing well. No more seizures. Last seizure was in 2006 in nighttime sleep. His has intermittent episodes of spinning sensation, vertigo, lasting for a few minutes of time. Meclizine seems to help.   PRIOR HPI (11/03/12, Dr. Erling Cruz): 59 year old right-handed white married male with a past history of recurrent dizziness beginning in 1985. EEG 08/09/1983 showed epileptiform activity bilaterally in the frontotemporal regions, more prominent on the right. His first generalized seizure was 08/04/1983 and the second occurred 09/07/1983. [name] has been on Dilantin or phenytoin medication since 09/07/1993. His last generalized major motor seizure was 08/13/2004. At that time Keppra was added to his regiment and he has not had a seizure since. Medications are phenytoin sodium capsules 100 mg 2 in the morning and 3 at night and levitercetam 500 mg one half tablet twice per day. He is also on protonix 40 mg once daily. He discontinued simvastatin because of muscle pain. 11/27/2009 he was at work and developed nausea without vomiting. He had to sit down. He felt as if he had to focus on the wall and be very still. He was seen at [facility] emergency room where a Chest x-ray was normal and subsequent stress cardiac test was normal. CBC, basic metabolic panel, CPK, and cardiac markers were normal. A phenytoin level was 15.4. He has not had recurrent symptoms. His last EEG 05/19/2010 was normal. His last MRI of the brain  08/21/2004 showed no significant abnormalities. 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 He denies macropsia, micropsia, strange odors or tastes. The patient has headaches behind his eyes. Over the last 4 months he has recurrent dizziness. His has tingling in his left arm. At one point he was on meclizine, but is no longer taking it. He feels dizzy like "I'm on a boat". Sitting down or lying down  helps. There is a sensation of "nearly spinning".The symptoms last 30 minutes to an hour. They can occur every 2-3 days. He is treating them with Dramamine 25 mg per day. He describes a lightheaded sensation or pressure sensation in the eyes.    REVIEW OF SYSTEMS: Out of a complete 14 system review of symptoms, the patient complains only of the following symptoms, none and all other reviewed systems are negative.   ALLERGIES: Allergies  Allergen Reactions  . Esomeprazole Magnesium     REACTION: abdominal pain  . Simvastatin     Wasn't working for pt   . Alum Hydroxide-Mag Carbonate     Medication interaction only    HOME MEDICATIONS: Outpatient Medications Prior to Visit  Medication Sig Dispense Refill  . Aspirin-Acetaminophen-Caffeine (GOODY HEADACHE PO) Take by mouth as needed.    Marland Kitchen Dextromethorphan-guaiFENesin (ROBITUSSIN DM PO) Take by mouth.    . lansoprazole (PREVACID) 30 MG capsule Take 1 capsule (30 mg total) by mouth daily before breakfast. 90 capsule 3  . meclizine (ANTIVERT) 25 MG tablet Take 25 mg by mouth 3 (three) times daily as needed for dizziness.    . sildenafil (VIAGRA) 25 MG tablet Take 1-2 tablets (25-50 mg total) by mouth daily as needed for erectile dysfunction. 10 tablet 5  . fluticasone (FLONASE) 50 MCG/ACT nasal spray Place 1 spray into both nostrils as needed.     . loratadine-pseudoephedrine (CLARITIN-D 24-HOUR) 10-240 MG 24 hr tablet Take 1 tablet by mouth as needed.     . phenytoin (DILANTIN) 100 MG ER capsule TAKE 2 CAPSULES BY MOUTH EVERY MORNING AND TAKE 3 CAPSULES AT BEDTIME 450 capsule 4   No facility-administered medications prior to visit.    PAST MEDICAL HISTORY: Past Medical History:  Diagnosis Date  . Esophageal reflux   . Hiatal hernia   . Low back pain   . Other and unspecified hyperlipidemia   . Seizure disorder ([place])   . Tremor     PAST SURGICAL HISTORY: Past Surgical History:  Procedure Laterality Date  . INGUINAL HERNIA  REPAIR Right 12/06/2013   Procedure:  REPAIR RIGHT INGUINAL HERNIA;  Surgeon: Joyice Faster. Cornett, MD;  Location: [facility];  Service: General;  Laterality: Right;  . INGUINAL HERNIA REPAIR Left 2015  . INSERTION OF MESH N/A 12/06/2013   Procedure: INSERTION OF MESH;  Surgeon: Joyice Faster. Cornett, MD;  Location: [facility];  Service: General;  Laterality: N/A;  . NO PAST SURGERIES      FAMILY HISTORY: Family History  Problem Relation Age of Onset  . Alzheimer's disease Mother   . Cancer Father   . Stroke Sister   . Alcohol abuse Brother   . Colon cancer Neg Hx   . Esophageal cancer Neg Hx   . Stomach cancer Neg Hx   . Rectal cancer Neg Hx     SOCIAL HISTORY: Social History   Socioeconomic History  . Marital status: Married    Spouse name: Not on file  . Number of children: 1  .  Years of education: Not on file  . Highest education level: Not on file  Occupational History  . Occupation: Shop Printmaker: [employer]    Comment: [employer]  Tobacco Use  . Smoking status: Former Smoker    Quit date: 07/21/2002    Years since quitting: 17.6  . Smokeless tobacco: Never Used  Substance and Sexual Activity  . Alcohol use: Yes    Comment: socially  . Drug use: No  . Sexual activity: Not on file  Other Topics Concern  . Not on file  Social History Narrative      He has been married 31+ years.   Caffeine Use: He drinks coke, tea, and coffee.   Truck Dealer   Social Determinants of Health   Financial Resource Strain:   . Difficulty of Paying Living Expenses: Not on file  Food Insecurity:   . Worried About Charity fundraiser in the Last Year: Not on file  . Ran Out of Food in the Last Year: Not on file  Transportation Needs:   . Lack of Transportation (Medical): Not on file  . Lack of Transportation (Non-Medical): Not on file  Physical Activity:   . Days of Exercise per Week: Not on file  . Minutes of Exercise  per Session: Not on file  Stress:   . Feeling of Stress : Not on file  Social Connections:   . Frequency of Communication with Friends and Family: Not on file  . Frequency of Social Gatherings with Friends and Family: Not on file  . Attends Religious Services: Not on file  . Active Member of Clubs or Organizations: Not on file  . Attends Archivist Meetings: Not on file  . Marital Status: Not on file  Intimate Partner Violence:   . Fear of Current or Ex-Partner: Not on file  . Emotionally Abused: Not on file  . Physically Abused: Not on file  . Sexually Abused: Not on file      PHYSICAL EXAM  Vitals:   03/27/20 1535  BP: 114/70  Pulse: 76  Weight: 225 lb 12.8 oz (102.4 kg)  Height: 6\' 2"  (1.88 m)   Body mass index is 28.99 kg/m.  Generalized: Well developed, in no acute distress  Cardiology: normal rate and rhythm, no murmur noted Respiratory: clear to auscultation bilaterally  Neurological examination  Mentation: Alert oriented to time, place, history taking. Follows all commands speech and language fluent Cranial nerve II-XII: Pupils were equal round reactive to light. Extraocular movements were full, visual field were full on confrontational test. Facial sensation and strength were normal. Head turning and shoulder shrug  were normal and symmetric. Motor: The motor testing reveals 5 over 5 strength of all 4 extremities. Good symmetric motor tone is noted throughout.  Sensory: Sensory testing is intact to soft touch on all 4 extremities. No evidence of extinction is noted.  Coordination: Cerebellar testing reveals good finger-nose-finger and heel-to-shin bilaterally.  Gait and station: Gait is normal.     DIAGNOSTIC DATA (LABS, IMAGING, TESTING) - I reviewed patient records, labs, notes, testing and imaging myself where available.  No flowsheet data found.   Lab Results  Component Value Date   WBC 6.6 09/01/2018   HGB 15.6 09/01/2018   HCT 45.0  09/01/2018   MCV 89.1 09/01/2018   PLT 180.0 09/01/2018      Component Value Date/Time   NA 142 09/01/2018 1540   NA 145 (H) 12/05/2015 1601  K 4.5 09/01/2018 1540   CL 102 09/01/2018 1540   CO2 31 09/01/2018 1540   GLUCOSE 81 09/01/2018 1540   BUN 13 09/01/2018 1540   BUN 12 12/05/2015 1601   CREATININE 1.04 09/01/2018 1540   CALCIUM 9.9 09/01/2018 1540   PROT 7.2 09/01/2018 1540   PROT 7.3 12/05/2015 1601   ALBUMIN 5.2 09/01/2018 1540   ALBUMIN 5.0 12/05/2015 1601   AST 21 09/01/2018 1540   ALT 17 09/01/2018 1540   ALKPHOS 77 09/01/2018 1540   BILITOT 0.7 09/01/2018 1540   BILITOT 0.5 12/05/2015 1601   GFRNONAA 95 12/05/2015 1601   GFRAA 110 12/05/2015 1601   Lab Results  Component Value Date   CHOL 251 (H) 09/01/2018   HDL 70.50 09/01/2018   LDLCALC 160 (H) 09/01/2018   LDLDIRECT 194.1 04/27/2009   TRIG 105.0 09/01/2018   CHOLHDL 4 09/01/2018   Lab Results  Component Value Date   HGBA1C 5.1 09/01/2018   No results found for: VITAMINB12 Lab Results  Component Value Date   TSH 0.88 09/03/2010       ASSESSMENT AND PLAN 60 y.o. year old male  has a past medical history of Esophageal reflux, Hiatal hernia, Low back pain, Other and unspecified hyperlipidemia, Seizure disorder ([place]), and Tremor. here with     ICD-10-CM   1. 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 We will continue current treatment plan. I will update abs today. He was encouraged to speak with PCP to ensure he has had a DEXA scan recently. Healthy lifestyle habits encouraged. He will follow up annually. He verbalizes understanding and agreement with this plan.    Orders Placed This Encounter  Procedures  . Phenytoin Level, Total  . CBC with Differential/Platelets  . CMP  . Vitamin D, 25-hydroxy     Meds ordered this encounter   Medications  . phenytoin (DILANTIN) 100 MG ER capsule    Sig: TAKE 2 CAPSULES BY MOUTH EVERY MORNING AND TAKE 3 CAPSULES AT BEDTIME    Dispense:  450 capsule    Refill:  4    Order Specific Question:   Supervising Provider    Answer:   Melvenia Beam [[id number]]      I spent 30 minutes with the patient. 50% of this time was spent counseling and educating patient on plan of care and medications.    Debbora Presto, FNP-C 03/27/2020, 4:02 PM [facility] [address] [place], [place] [address] [phone]

## 2020-03-27 NOTE — Patient Instructions (Signed)
We will continue phenytoin as prescribed.   Stay well hydrated. Healthy, well balanced diet and regular exercise encouraged.   Continue routine follow up with blood work and screening tests (DEXA scan, Colonoscopy, etc. As directed).   Follow up in 1 year, sooner if needed.   Seizure, Adult A seizure is a sudden burst of abnormal electrical activity in the brain. Seizures usually last from 30 seconds to 2 minutes. They can cause many different symptoms. Usually, seizures are not harmful unless they last a long time. What are the causes? Common causes of this condition include:  Fever or infection.  Conditions that affect the brain, such as: ? A brain abnormality that you were born with. ? A brain or head injury. ? Bleeding in the brain. ? A tumor. ? Stroke. ? Brain disorders such as autism or cerebral palsy.  Low blood sugar.  Conditions that are passed from parent to child (are inherited).  Problems with substances, such as: ? Having a reaction to a drug or a medicine. ? Suddenly stopping the use of a substance (withdrawal). In some cases, the cause may not be known. A person who has repeated seizures over time without a clear cause has a condition called epilepsy. What increases the risk? You are more likely to get this condition if you have:  A family history of epilepsy.  Had a seizure in the past.  A brain disorder.  A history of head injury, lack of oxygen at birth, or strokes. What are the signs or symptoms? There are many types of seizures. The symptoms vary depending on the type of seizure you have. Examples of symptoms during a seizure include:  Shaking (convulsions).  Stiffness in the body.  Passing out (losing consciousness).  Head nodding.  Staring.  Not responding to sound or touch.  Loss of bladder control and bowel control. Some people have symptoms right before and right after a seizure happens. Symptoms before a seizure may  include:  Fear.  Worry (anxiety).  Feeling like you may vomit (nauseous).  Feeling like the room is spinning (vertigo).  Feeling like you saw or heard something before (dj vu).  Odd tastes or smells.  Changes in how you see. You may see flashing lights or spots. Symptoms after a seizure happens can include:  Confusion.  Sleepiness.  Headache.  Weakness on one side of the body. How is this treated? Most seizures will stop on their own in under 5 minutes. In these cases, no treatment is needed. Seizures that last longer than 5 minutes will usually need treatment. Treatment can include:  Medicines given through an IV tube.  Avoiding things that are known to cause your seizures. These can include medicines that you take for another condition.  Medicines to treat epilepsy.  Surgery to stop the seizures. This may be needed if medicines do not help. Follow these instructions at home: Medicines  Take over-the-counter and prescription medicines only as told by your doctor.  Do not eat or drink anything that may keep your medicine from working, such as alcohol. Activity  Do not do any activities that would be dangerous if you had another seizure, like driving or swimming. Wait until your doctor says it is safe for you to do them.  If you live in the [place] ask your local DMV (department of motor vehicles) when you can drive.  Get plenty of rest. Teaching others Teach friends and family what to do when you have a seizure. They should:  Lay you on the ground.  Protect your head and body.  Loosen any tight clothing around your neck.  Turn you on your side.  Not hold you down.  Not put anything into your mouth.  Know whether or not you need emergency care.  Stay with you until you are better.  General instructions  Contact your doctor each time you have a seizure.  Avoid anything that gives you seizures.  Keep a seizure diary. Write down: ? What you think  caused each seizure. ? What you remember about each seizure.  Keep all follow-up visits as told by your doctor. This is important. Contact a doctor if:  You have another seizure.  You have seizures more often.  There is any change in what happens during your seizures.  You keep having seizures with treatment.  You have symptoms of being sick or having an infection. Get help right away if:  You have a seizure that: ? Lasts longer than 5 minutes. ? Is different than seizures you had before. ? Makes it harder to breathe. ? Happens after you hurt your head.  You have any of these symptoms after a seizure: ? Not being able to speak. ? Not being able to use a part of your body. ? Confusion. ? A bad headache.  You have two or more seizures in a row.  You do not wake up right after a seizure.  You get hurt during a seizure. These symptoms may be an emergency. Do not wait to see if the symptoms will go away. Get medical help right away. Call your local emergency services (911 in the [place] Do not drive yourself to the hospital. Summary  Seizures usually last from 30 seconds to 2 minutes. Usually, they are not harmful unless they last a long time.  Do not eat or drink anything that may keep your medicine from working, such as alcohol.  Teach friends and family what to do when you have a seizure.  Contact your doctor each time you have a seizure. This information is not intended to replace advice given to you by your health care provider. Make sure you discuss any questions you have with your health care provider. Document Revised: 09/24/2018 Document Reviewed: 09/24/2018 Elsevier Patient Education  [employer].

## 2020-03-28 ENCOUNTER — Other Ambulatory Visit: Payer: Self-pay | Admitting: Family Medicine

## 2020-03-28 DIAGNOSIS — G40109 Localization-related (focal) (partial) symptomatic epilepsy and epileptic syndromes with simple partial seizures, not intractable, without status epilepticus: Secondary | ICD-10-CM

## 2020-03-28 LAB — CBC WITH DIFFERENTIAL/PLATELET
Basophils Absolute: 0 10*3/uL (ref 0.0–0.2)
Basos: 0 %
EOS (ABSOLUTE): 0.3 10*3/uL (ref 0.0–0.4)
Eos: 4 %
Hematocrit: 39.1 % (ref 37.5–51.0)
Hemoglobin: 14.7 g/dL (ref 13.0–17.7)
Immature Grans (Abs): 0 10*3/uL (ref 0.0–0.1)
Immature Granulocytes: 0 %
Lymphocytes Absolute: 1.8 10*3/uL (ref 0.7–3.1)
Lymphs: 25 %
MCH: 33 pg (ref 26.6–33.0)
MCHC: 37.6 g/dL — ABNORMAL HIGH (ref 31.5–35.7)
MCV: 88 fL (ref 79–97)
Monocytes Absolute: 0.5 10*3/uL (ref 0.1–0.9)
Monocytes: 7 %
Neutrophils Absolute: 4.7 10*3/uL (ref 1.4–7.0)
Neutrophils: 64 %
Platelets: 179 10*3/uL (ref 150–450)
RBC: 4.45 x10E6/uL (ref 4.14–5.80)
RDW: 12.6 % (ref 11.6–15.4)
WBC: 7.3 10*3/uL (ref 3.4–10.8)

## 2020-03-28 LAB — COMPREHENSIVE METABOLIC PANEL
ALT: 16 IU/L (ref 0–44)
AST: 21 IU/L (ref 0–40)
Albumin/Globulin Ratio: 2.8 — ABNORMAL HIGH (ref 1.2–2.2)
Albumin: 5 g/dL — ABNORMAL HIGH (ref 3.8–4.9)
Alkaline Phosphatase: 81 IU/L (ref 48–121)
BUN/Creatinine Ratio: 13 (ref 9–20)
BUN: 13 mg/dL (ref 6–24)
Bilirubin Total: 0.4 mg/dL (ref 0.0–1.2)
CO2: 28 mmol/L (ref 20–29)
Calcium: 9.4 mg/dL (ref 8.7–10.2)
Chloride: 104 mmol/L (ref 96–106)
Creatinine, Ser: 1.02 mg/dL (ref 0.76–1.27)
GFR calc Af Amer: 93 mL/min/{1.73_m2} (ref >59)
GFR calc non Af Amer: 80 mL/min/{1.73_m2} (ref >59)
Globulin, Total: 1.8 g/dL (ref 1.5–4.5)
Glucose: 83 mg/dL (ref 65–99)
Potassium: 4.3 mmol/L (ref 3.5–5.2)
Sodium: 144 mmol/L (ref 134–144)
Total Protein: 6.8 g/dL (ref 6.0–8.5)

## 2020-03-28 LAB — VITAMIN D 25 HYDROXY (VIT D DEFICIENCY, FRACTURES): Vit D, 25-Hydroxy: 15.6 ng/mL — ABNORMAL LOW (ref 30.0–100.0)

## 2020-03-28 LAB — PHENYTOIN LEVEL, TOTAL: Phenytoin (Dilantin), Serum: 20.4 ug/mL (ref 10.0–20.0)

## 2020-03-28 MED ORDER — VITAMIN D (ERGOCALCIFEROL) 1.25 MG (50000 UNIT) PO CAPS: 50000.0000 [IU] | ORAL_CAPSULE | ORAL | 0 refills | Status: DC

## 2020-03-28 NOTE — Addendum Note (Signed)
Addended by: Inis Sizer D on: 03/28/2020 09:36 AM   Modules accepted: Orders

## 2020-03-28 NOTE — Progress Notes (Signed)
vit

## 2020-04-02 ENCOUNTER — Telehealth: Payer: Self-pay | Admitting: Family Medicine

## 2020-04-02 NOTE — Telephone Encounter (Signed)
I called pt back and gave him results of labs.  He will check with pcp about dexa scan (recommended every 2-3 yrs.) 12 wks of Vit D 50,000u (one cap every 7 days), do not need to take daily vit d as well until after 50,000u dosing if finished.  He verbalized understanding. Will get 3 mon lab recheck. Pt placed on calendar.

## 2020-04-02 NOTE — Telephone Encounter (Signed)
Pt called to return phone call from nurse about results.

## 2020-04-03 ENCOUNTER — Other Ambulatory Visit: Payer: Self-pay | Admitting: Family Medicine

## 2020-04-03 DIAGNOSIS — G40109 Localization-related (focal) (partial) symptomatic epilepsy and epileptic syndromes with simple partial seizures, not intractable, without status epilepticus: Secondary | ICD-10-CM

## 2020-04-03 NOTE — Addendum Note (Signed)
Addended byUbaldo Glassing, Sawyer Mentzer L on: 04/03/2020 10:21 AM   Modules accepted: Orders

## 2020-04-24 NOTE — Progress Notes (Signed)
I reviewed note and agree with plan.   Penni Bombard, MD 09/23/4654, 8:12 PM Certified in Neurology, Neurophysiology and Neuroimaging  [facility] [address], [place], [place] [address] [phone]

## 2021-02-19 ENCOUNTER — Other Ambulatory Visit: Payer: Self-pay | Admitting: Family Medicine

## 2021-02-19 NOTE — Telephone Encounter (Signed)
Please schedule CPE with fasting labs prior with Dr. Copland.  

## 2021-02-19 NOTE — Telephone Encounter (Signed)
Left voice message to call the office  

## 2021-03-26 NOTE — Patient Instructions (Signed)
Below is our plan:  We will continue phenytoin 200 in the mornings and '300mg'$  in the evenings. I will update labs, today. Talk with Dr Lorelei Pont about dizziness if not getting better. I am worried about blood pressure fluctuations.   Please make sure you are staying well hydrated. I recommend 50-60 ounces daily. Well balanced diet and regular exercise encouraged. Consistent sleep schedule with 6-8 hours recommended.   Please continue follow up with care team as directed.   Follow up with me in 1 year   You may receive a survey regarding today's visit. I encourage you to leave honest feed back as I do use this information to improve patient care. Thank you for seeing me today!

## 2021-03-26 NOTE — Progress Notes (Signed)
PATIENT: Wayne Vang DOB: Dec 02, 1960  REASON FOR VISIT: follow up HISTORY FROM: patient  Chief Complaint  Patient presents with   Follow-up    Pt alone, new room. He states that there were couple times he would have dizzy spells which tend to happen more. Denies seizures.     HISTORY OF PRESENT ILLNESS:  03/27/21 ALL:  Wayne Vang returns for follow up for seizures. He continues phenytoin 200/300. No seizure activity. Vitamin D was low at last follow up 03/2020. He was given rx and advised to follow up with PCP for routine Dexa. He does not think this was done. He has tried to eat more vegetables and fruits. He is taking a calcium supplement but does not think he is taking vitamin D. He continues to have intermittent dizziness. Worse with working in the heat and worse with position changes. He is a Dealer. He doesn't feel it is same dizziness as before that was diagnosed as vertigo. He is drinking 4 bottles of water and 4 bottles of Gatorade every day. He has follow up with Dr Lorelei Pont next week to discuss. He does not routinely check BP. 137/78 today.   03/27/2020 ALL:  Wayne Vang is a 60 y.o. male here today for follow up for seizures. He continues phenytoin '200mg'$  in the mornings and '300mg'$  at bedtime. He is tolerating medication well. No recent seizure activity. Last seizure in 2006. He tried to wean phenytoin in 2018 but reports "feeling funny" and dose was resumed.   He is followed routinely by PCP. He recently had a CPE. He reports that labs were normal. He is not sure if phenytoin levels were checked. He thinks he has had a recent DEXA but is unsure. He is taking a calcium supplement but unsure if it has vitamin D.     HISTORY: (copied from my note on 03/23/2019)  Wayne Vang is a 60 y.o. male here today for follow up for seizure disorder. He continues phenyotoin 200 in am and 300 in pm. He denies seizure activity. He is tolerating medication well with no adverse effects.     HISTORY: (copied from Dr Gladstone Lighter note on 12/28/2017)   UPDATE (12/28/17, VRP): Since last visit, doing well. Tolerating dilantin. No alleviating or aggravating factors. Tried to lower to '200mg'$  twice a day, but felt "funny" and now back to 200 / 300.    UPDATE 12/23/16: Since last visit, no seizures. Tolerating dilantin. Having more joint aches and pain in the last few years. No other issues.    UPDATE 12/05/15: Since last visit, doing about the same. No seizures. Now off levetiracetam. Continues on phenytoin 200 / 300.    UPDATE 06/04/15: Doing well. No seizures. No vertigo attacks. Tolerating meds.    UPDATE 03/15/14 (LL): since last visit patient has been doing well, reports no seizures. Tolerating Keppra and Dilantin without known side effects.  Still with intermittent episodes of vertigo, unchanged. No complaints today.   UPDATE 12/07/12 (VRP): since last visit patient doing well. No more seizures. Last seizure was in 2006 in nighttime sleep. His has intermittent episodes of spinning sensation, vertigo, lasting for a few minutes of time. Meclizine seems to help.    PRIOR HPI (11/03/12, Dr. Erling Cruz): 60 year old right-handed white married male with a past history of recurrent dizziness beginning in 1985. EEG 08/09/1983 showed epileptiform activity bilaterally in the frontotemporal regions, more prominent on the right. His first generalized seizure was 08/04/1983 and the second occurred 09/07/1983.  Wayne Vang has been on Dilantin or phenytoin medication since 09/07/1993. His last generalized major motor seizure was 08/13/2004. At that time Keppra was added to his regiment and he has not had a seizure since. Medications are phenytoin sodium capsules 100 mg 2 in the morning and 3 at night and levitercetam 500 mg one half tablet twice per day. He is also on protonix 40 mg once daily. He discontinued simvastatin because of muscle pain. 11/27/2009 he was at work and developed nausea without vomiting. He had to sit down. He  felt as if he had to focus on the wall and be very still. He was seen at [facility] emergency room where a Chest x-ray was normal and subsequent stress cardiac test was normal. CBC, basic metabolic panel, CPK, and cardiac markers were normal. A phenytoin level was 15.4. He has not had recurrent symptoms. His last EEG 05/19/2010 was normal. His last MRI of the brain 08/21/2004 showed no significant abnormalities. 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 He denies macropsia, micropsia, strange odors or tastes. The patient has headaches behind his eyes. Over the last 4 months he has recurrent dizziness. His has tingling in his left arm. At one point he was on meclizine, but is no longer taking it. He feels dizzy like "I'm on a boat". Sitting down or lying down helps. There is a sensation of "nearly spinning".The symptoms last 30 minutes to an hour. They can occur every 2-3 days. He is treating them with Dramamine 25 mg per day. He describes a lightheaded sensation or pressure sensation in the eyes.     REVIEW OF SYSTEMS: Out of a complete 14 system review of symptoms, the patient complains only of the following symptoms, dizziness and all other reviewed systems are negative.   ALLERGIES: Allergies  Allergen Reactions   Esomeprazole Magnesium     REACTION: abdominal pain   Simvastatin     Wasn't working for pt    Alum Hydroxide-Mag Carbonate     Medication interaction only    HOME MEDICATIONS: Outpatient Medications Prior to Visit  Medication Sig Dispense Refill   Aspirin-Acetaminophen-Caffeine (GOODY HEADACHE PO) Take by mouth as needed.     Dextromethorphan-guaiFENesin (ROBITUSSIN DM PO) Take by mouth.     lansoprazole (PREVACID) 30 MG capsule TAKE 1 CAPSULE (30 MG TOTAL) BY MOUTH DAILY BEFORE BREAKFAST. 90 capsule 0   sildenafil (VIAGRA) 25 MG tablet Take 1-2 tablets (25-50 mg total) by mouth daily as needed for erectile dysfunction. 10 tablet 5    Vitamin D, Ergocalciferol, (DRISDOL) 1.25 MG (50000 UNIT) CAPS capsule Take 1 capsule (50,000 Units total) by mouth every 7 (seven) days. 12 capsule 0   meclizine (ANTIVERT) 25 MG tablet Take 25 mg by mouth 3 (three) times daily as needed for dizziness.     phenytoin (DILANTIN) 100 MG ER capsule TAKE 2 CAPSULES BY MOUTH EVERY MORNING AND TAKE 3 CAPSULES AT BEDTIME 450 capsule 4   No facility-administered medications prior to visit.    PAST MEDICAL HISTORY: Past Medical History:  Diagnosis Date   Esophageal reflux    Hiatal hernia    Low back pain    Other and unspecified hyperlipidemia    Seizure disorder [facility])    Tremor     PAST SURGICAL HISTORY: Past Surgical History:  Procedure Laterality Date   INGUINAL HERNIA REPAIR Right 12/06/2013   Procedure:  REPAIR RIGHT INGUINAL HERNIA;  Surgeon: Joyice Faster. Cornett, MD;  Location: [place];  Service: General;  Laterality: Right;   INGUINAL HERNIA REPAIR Left 2015   INSERTION OF MESH N/A 12/06/2013   Procedure: INSERTION OF MESH;  Surgeon: Joyice Faster. Cornett, MD;  Location: [place];  Service: General;  Laterality: N/A;   NO PAST SURGERIES      FAMILY HISTORY: Family History  Problem Relation Age of Onset   Alzheimer's disease Mother    Cancer Father    Stroke Sister    Alcohol abuse Brother    Colon cancer Neg Hx    Esophageal cancer Neg Hx    Stomach cancer Neg Hx    Rectal cancer Neg Hx     SOCIAL HISTORY: Social History   Socioeconomic History   Marital status: Married    Spouse name: Not on file   Number of children: 1   Years of education: Not on file   Highest education level: Not on file  Occupational History   Occupation: Shop Printmaker: [employer]    Comment: [employer]  Tobacco Use   Smoking status: Former    Types: Cigarettes    Quit date: 07/21/2002    Years since quitting: 18.6   Smokeless tobacco: Never  Substance and Sexual Activity    Alcohol use: Yes    Comment: socially   Drug use: No   Sexual activity: Not on file  Other Topics Concern   Not on file  Social History Narrative      He has been married 31+ years.   Caffeine Use: He drinks coke, tea, and coffee.   Truck Dealer   Social Determinants of Radio broadcast assistant Strain: Not on file  Food Insecurity: Not on file  Transportation Needs: Not on file  Physical Activity: Not on file  Stress: Not on file  Social Connections: Not on file  Intimate Partner Violence: Not on file      PHYSICAL EXAM  Vitals:   03/27/21 1406  BP: 137/78  Pulse: 84  Weight: 223 lb (101.2 kg)  Height: '6\' 3"'$  (1.905 m)    Body mass index is 27.87 kg/m.  Generalized: Well developed, in no acute distress  Cardiology: normal rate and rhythm, no murmur noted Respiratory: clear to auscultation bilaterally  Neurological examination  Mentation: Alert oriented to time, place, history taking. Follows all commands speech and language fluent Cranial nerve II-XII: Pupils were equal round reactive to light. Extraocular movements were full, visual field were full on confrontational test. Facial sensation and strength were normal. Head turning and shoulder shrug  were normal and symmetric. No nystagmus  Motor: The motor testing reveals 5 over 5 strength of all 4 extremities. Good symmetric motor tone is noted throughout.  Sensory: Sensory testing is intact to soft touch on all 4 extremities. No evidence of extinction is noted.  Coordination: Cerebellar testing reveals good finger-nose-finger and heel-to-shin bilaterally.  Gait and station: Gait is normal.     DIAGNOSTIC DATA (LABS, IMAGING, TESTING) - I reviewed patient records, labs, notes, testing and imaging myself where available.  No flowsheet data found.   Lab Results  Component Value Date   WBC 7.3 03/27/2020   HGB 14.7 03/27/2020   HCT 39.1 03/27/2020   MCV 88 03/27/2020   PLT 179 03/27/2020      Component  Value Date/Time   NA 144 03/27/2020 1602   K 4.3 03/27/2020 1602   CL 104 03/27/2020 1602  CO2 28 03/27/2020 1602   GLUCOSE 83 03/27/2020 1602   GLUCOSE 81 09/01/2018 1540   BUN 13 03/27/2020 1602   CREATININE 1.02 03/27/2020 1602   CALCIUM 9.4 03/27/2020 1602   PROT 6.8 03/27/2020 1602   ALBUMIN 5.0 (H) 03/27/2020 1602   AST 21 03/27/2020 1602   ALT 16 03/27/2020 1602   ALKPHOS 81 03/27/2020 1602   BILITOT 0.4 03/27/2020 1602   GFRNONAA 80 03/27/2020 1602   GFRAA 93 03/27/2020 1602   Lab Results  Component Value Date   CHOL 251 (H) 09/01/2018   HDL 70.50 09/01/2018   LDLCALC 160 (H) 09/01/2018   LDLDIRECT 194.1 04/27/2009   TRIG 105.0 09/01/2018   CHOLHDL 4 09/01/2018   Lab Results  Component Value Date   HGBA1C 5.1 09/01/2018   No results found for: VITAMINB12 Lab Results  Component Value Date   TSH 0.88 09/03/2010       ASSESSMENT AND PLAN 60 y.o. year old male  has a past medical history of Esophageal reflux, Hiatal hernia, Low back pain, Other and unspecified hyperlipidemia, Seizure disorder ([place]), and Tremor. here with     ICD-10-CM   1. 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 Vitamin D deficiency  E55.9 Vitamin D, 25-hydroxy       Jahdai is doing well on phenytoin '200mg'$  in am and '300mg'$  in pm. We will continue current treatment plan. I will update abs today. He was encouraged to speak with PCP to ensure he has had a DEXA scan recently. He will continue calcium supplements and add vitamin D pending lab results. He will discuss concerns of intermittent dizziness with position changes at visit with Dr Lorelei Pont. He will monitor BP at home and monitor sodium intake. Healthy lifestyle habits encouraged. He will follow up annually. He verbalizes understanding and agreement with this plan.    Orders Placed This Encounter  Procedures   Phenytoin Level, Total   Vitamin  D, 25-hydroxy   CBC with Differential/Platelets      Meds ordered this encounter  Medications   phenytoin (DILANTIN) 100 MG ER capsule    Sig: TAKE 2 CAPSULES BY MOUTH EVERY MORNING AND TAKE 3 CAPSULES AT BEDTIME    Dispense:  450 capsule    Refill:  4    Order Specific Question:   Supervising Provider    Answer:   Melvenia Beam [id number], FNP-C 03/27/2021, 2:34 PM [facility] [address] [place], [place] [address] [phone]

## 2021-03-27 ENCOUNTER — Ambulatory Visit: Payer: PRIVATE HEALTH INSURANCE | Admitting: Family Medicine

## 2021-03-27 ENCOUNTER — Encounter: Payer: Self-pay | Admitting: Family Medicine

## 2021-03-27 VITALS — BP 137/78 | HR 84 | Ht 75.0 in | Wt 223.0 lb

## 2021-03-27 DIAGNOSIS — E559 Vitamin D deficiency, unspecified: Secondary | ICD-10-CM | POA: Diagnosis not present

## 2021-03-27 DIAGNOSIS — G40109 Localization-related (focal) (partial) symptomatic epilepsy and epileptic syndromes with simple partial seizures, not intractable, without status epilepticus: Secondary | ICD-10-CM | POA: Diagnosis not present

## 2021-03-27 MED ORDER — PHENYTOIN SODIUM EXTENDED 100 MG PO CAPS: ORAL_CAPSULE | ORAL | 4 refills | Status: DC

## 2021-03-28 ENCOUNTER — Telehealth: Payer: Self-pay

## 2021-03-28 ENCOUNTER — Other Ambulatory Visit: Payer: Self-pay | Admitting: Family Medicine

## 2021-03-28 DIAGNOSIS — G40109 Localization-related (focal) (partial) symptomatic epilepsy and epileptic syndromes with simple partial seizures, not intractable, without status epilepticus: Secondary | ICD-10-CM

## 2021-03-28 LAB — CBC WITH DIFFERENTIAL/PLATELET
Basophils Absolute: 0 10*3/uL (ref 0.0–0.2)
Basos: 1 %
EOS (ABSOLUTE): 0.3 10*3/uL (ref 0.0–0.4)
Eos: 3 %
Hematocrit: 41.9 % (ref 37.5–51.0)
Hemoglobin: 15.8 g/dL (ref 13.0–17.7)
Immature Grans (Abs): 0 10*3/uL (ref 0.0–0.1)
Immature Granulocytes: 0 %
Lymphocytes Absolute: 2 10*3/uL (ref 0.7–3.1)
Lymphs: 26 %
MCH: 31.8 pg (ref 26.6–33.0)
MCHC: 37.7 g/dL — ABNORMAL HIGH (ref 31.5–35.7)
MCV: 84 fL (ref 79–97)
Monocytes Absolute: 0.5 10*3/uL (ref 0.1–0.9)
Monocytes: 7 %
Neutrophils Absolute: 4.8 10*3/uL (ref 1.4–7.0)
Neutrophils: 63 %
Platelets: 193 10*3/uL (ref 150–450)
RBC: 4.97 x10E6/uL (ref 4.14–5.80)
RDW: 11.6 % (ref 11.6–15.4)
WBC: 7.7 10*3/uL (ref 3.4–10.8)

## 2021-03-28 LAB — VITAMIN D 25 HYDROXY (VIT D DEFICIENCY, FRACTURES): Vit D, 25-Hydroxy: 18.7 ng/mL — ABNORMAL LOW (ref 30.0–100.0)

## 2021-03-28 LAB — PHENYTOIN LEVEL, TOTAL: Phenytoin (Dilantin), Serum: 26.1 ug/mL (ref 10.0–20.0)

## 2021-03-28 NOTE — Telephone Encounter (Signed)
I called pt and spoke with his wife Barnett Applebaum, ok per dpr). I relayed results and she verbalized understanding. She sts pt should be home from work soon and she will advise of results. She sts it should not be a problem for the pt to come to our office on 04/01/21 at 800 for lab testing.  She sts if this is an issue the pt will call us back and we can discuss sending orders to PCP.   She sts she will relay the recommendation of the VIT d supplement along with calcium. She sts she will also reiterate importance of having the DEXA scan. I advised if the pt had any questions for him to call back and we could discuss further.

## 2021-03-28 NOTE — Telephone Encounter (Signed)
-----   Message from Debbora Presto, NP sent at 03/28/2021  2:56 PM EDT ----- Can you guys please let him know that his labs are back. His phenytoin level is high. I am scared this may be related to timing of his last dose of phenytoin and when he saw me this week. I would like to repeat his lab first thing in the morning before he takes his morning dose. Our lab staff are usually here by 8am so if he could swing by any morning Monday through Thursday around 8 we can get him taken care of. I know he lives a little ways from [place] but think he works close by. We could ask PCP to draw as well if they are willing and that is closer. His vitamin D is still low. I would like for him to get vitamin D OTC and take 5,000iu daily. He should be on calcium as well and was advised of this in the office. Remind him to check in with PCP regarding DEXA scan. We will continue current dose of phenytoin for now but may need to discuss adjustment if levels remain high. TY!

## 2021-04-01 ENCOUNTER — Other Ambulatory Visit (INDEPENDENT_AMBULATORY_CARE_PROVIDER_SITE_OTHER): Payer: Self-pay

## 2021-04-01 ENCOUNTER — Ambulatory Visit (INDEPENDENT_AMBULATORY_CARE_PROVIDER_SITE_OTHER): Payer: 59 | Admitting: Family Medicine

## 2021-04-01 ENCOUNTER — Encounter: Payer: Self-pay | Admitting: Family Medicine

## 2021-04-01 ENCOUNTER — Other Ambulatory Visit: Payer: Self-pay

## 2021-04-01 VITALS — BP 110/70 | HR 88 | Temp 98.3°F | Ht 74.0 in | Wt 224.1 lb

## 2021-04-01 DIAGNOSIS — C44202 Unspecified malignant neoplasm of skin of right ear and external auricular canal: Secondary | ICD-10-CM

## 2021-04-01 DIAGNOSIS — Z9189 Other specified personal risk factors, not elsewhere classified: Secondary | ICD-10-CM

## 2021-04-01 DIAGNOSIS — Z1211 Encounter for screening for malignant neoplasm of colon: Secondary | ICD-10-CM

## 2021-04-01 DIAGNOSIS — Z0000 Encounter for general adult medical examination without abnormal findings: Secondary | ICD-10-CM | POA: Diagnosis not present

## 2021-04-01 DIAGNOSIS — Z125 Encounter for screening for malignant neoplasm of prostate: Secondary | ICD-10-CM | POA: Diagnosis not present

## 2021-04-01 DIAGNOSIS — Z0289 Encounter for other administrative examinations: Secondary | ICD-10-CM

## 2021-04-01 DIAGNOSIS — Z131 Encounter for screening for diabetes mellitus: Secondary | ICD-10-CM

## 2021-04-01 DIAGNOSIS — Z79899 Other long term (current) drug therapy: Secondary | ICD-10-CM | POA: Diagnosis not present

## 2021-04-01 DIAGNOSIS — Z13220 Encounter for screening for lipoid disorders: Secondary | ICD-10-CM | POA: Diagnosis not present

## 2021-04-01 DIAGNOSIS — G40109 Localization-related (focal) (partial) symptomatic epilepsy and epileptic syndromes with simple partial seizures, not intractable, without status epilepticus: Secondary | ICD-10-CM

## 2021-04-01 NOTE — Patient Instructions (Signed)
Vitamin D: 2,000 units each day Calcium 1,000 mg a day

## 2021-04-01 NOTE — Addendum Note (Signed)
Addended by: Ellamae Sia on: 04/01/2021 05:06 PM   Modules accepted: Orders

## 2021-04-01 NOTE — Progress Notes (Signed)
Nizar Cutler T. Bradi Arbuthnot, MD, [place] at [facility] [place], [address]  Phone: [phone]  FAX: [place] - 60 y.o. male  MRN [id number]  Date of Birth: Mar 18, 1961  Date: 04/01/2021  PCP: Tirpak Loffler, MD  Referral: All Loffler, MD  Chief Complaint  Patient presents with  . Annual Exam    This visit occurred during the SARS-CoV-2 public health emergency.  Safety protocols were in place, including screening questions prior to the visit, additional usage of staff PPE, and extensive cleaning of exam room while observing appropriate contact time as indicated for disinfecting solutions.   Patient Care Team: Amspacher Loffler, MD as PCP - General Subjective:   Wayne Vang is a 60 y.o. pleasant patient who presents with the following:  Preventative Health Maintenance Visit:  Health Maintenance Summary Reviewed and updated, unless pt declines services.  Tobacco History Reviewed. Alcohol: minimal  Exercise Habits: walks a lot at work  STD concerns: no risk or activity to increase risk Drug Use: None  COVID-19 - has had times one.   Shingrix - will hold off Follow-up colonoscopy, 2017 3-year recall Influenza - declines  Bone density - on dilantin with increased risk  Vit D -   Last vitamin D Lab Results  Component Value Date   VD25OH 18.7 (L) 03/27/2021     Ca    Health Maintenance  Topic Date Due  . COVID-19 Vaccine (1) Never done  . Zoster Vaccines- Shingrix (1 of 2) Never done  . COLONOSCOPY (Pts 45-39yr Insurance coverage will need to be confirmed)  02/08/2019  . INFLUENZA VACCINE  10/18/2021 (Originally 02/18/2021)  . TETANUS/TDAP  09/01/2028  . Hepatitis C Screening  Completed  . HIV Screening  Completed  . Pneumococcal Vaccine [id number] old  Aged Out  . HPV VACCINES  Aged Out   Immunization History  Administered Date(s) Administered  . Tdap 09/01/2018    Patient Active Problem List   Diagnosis Date Noted  . Localization-related epilepsy ([place] 12/07/2012  . HYPERLIPIDEMIA 01/26/2009  . SEIZURES, HX OF (DR. LOVE) 07/27/2008  . GERD 04/21/2007    Past Medical History:  Diagnosis Date  . Esophageal reflux   . Hiatal hernia   . Low back pain   . Other and unspecified hyperlipidemia   . Seizure disorder ([place]   . Tremor     Past Surgical History:  Procedure Laterality Date  . INGUINAL HERNIA REPAIR Right 12/06/2013   Procedure:  REPAIR RIGHT INGUINAL HERNIA;  Surgeon: TJoyice Faster Cornett, MD;  [place]  Service: General;  Laterality: Right;  . INGUINAL HERNIA REPAIR Left 2015  . INSERTION OF MESH N/A 12/06/2013   Procedure: INSERTION OF MESH;  Surgeon: TJoyice Faster Cornett, MD;  [place]  Service: General;  Laterality: N/A;  . NO PAST SURGERIES      Family History  Problem Relation Age of Onset  . Alzheimer's disease Mother   . Cancer Father   . Stroke Sister   . Alcohol abuse Brother   . Colon cancer Neg Hx   . Esophageal cancer Neg Hx   . Stomach cancer Neg Hx   . Rectal cancer Neg Hx     Past Medical History, Surgical History, Social History, Family History, Problem List, Medications, and Allergies have been reviewed and updated if relevant.  Review of Systems: Pertinent positives are listed above.  Otherwise, a full  14 point review of systems has been done in full and it is negative except where it is noted positive.  Objective:   BP 110/70   Pulse 88   Temp 98.3 F (36.8 C) (Temporal)   Ht '6\' 2"'$  (1.88 m)   Wt 224 lb 2 oz (101.7 kg)   SpO2 97%   BMI 28.78 kg/m  Ideal Body Weight: Weight in (lb) to have BMI = 25: 194.3  Ideal Body Weight: Weight in (lb) to have BMI = 25: 194.3 No results found. Depression screen [facility] 2/9 04/01/2021 02/22/2020 09/01/2018  Decreased Interest 0 0 0  Down, Depressed, Hopeless 0 0 0  PHQ - 2 Score 0 0 0     GEN: well developed,  well nourished, no acute distress Eyes: conjunctiva and lids normal, PERRLA, EOMI ENT: TM clear, nares clear, oral exam WNL Neck: supple, no lymphadenopathy, no thyromegaly, no JVD Pulm: clear to auscultation and percussion, respiratory effort normal CV: regular rate and rhythm, S1-S2, no murmur, rub or gallop, no bruits, peripheral pulses normal and symmetric, no cyanosis, clubbing, edema or varicosities GI: soft, non-tender; no hepatosplenomegaly, masses; active bowel sounds all quadrants GU: deferred Lymph: no cervical, axillary or inguinal adenopathy MSK: gait normal, muscle tone and strength WNL, no joint swelling, effusions, discoloration, crepitus  SKIN:      Neuro: normal mental status, normal strength, sensation, and motion Psych: alert; oriented to person, place and time, normally interactive and not anxious or depressed in appearance.  All labs reviewed with patient. Results for orders placed or performed in visit on 03/27/21  Phenytoin Level, Total  Result Value Ref Range   Phenytoin (Dilantin), Serum 26.1 (HH) 10.0 - 20.0 ug/mL  Vitamin D, 25-hydroxy  Result Value Ref Range   Vit D, 25-Hydroxy 18.7 (L) 30.0 - 100.0 ng/mL  CBC with Differential/Platelets  Result Value Ref Range   WBC 7.7 3.4 - 10.8 x10E3/uL   RBC 4.97 4.14 - 5.80 x10E6/uL   Hemoglobin 15.8 13.0 - 17.7 g/dL   Hematocrit 41.9 37.5 - 51.0 %   MCV 84 79 - 97 fL   MCH 31.8 26.6 - 33.0 pg   MCHC 37.7 (H) 31.5 - 35.7 g/dL   RDW 11.6 11.6 - 15.4 %   Platelets 193 150 - 450 x10E3/uL   Neutrophils 63 Not Estab. %   Lymphs 26 Not Estab. %   Monocytes 7 Not Estab. %   Eos 3 Not Estab. %   Basos 1 Not Estab. %   Neutrophils Absolute 4.8 1.4 - 7.0 x10E3/uL   Lymphocytes Absolute 2.0 0.7 - 3.1 x10E3/uL   Monocytes Absolute 0.5 0.1 - 0.9 x10E3/uL   EOS (ABSOLUTE) 0.3 0.0 - 0.4 x10E3/uL   Basophils Absolute 0.0 0.0 - 0.2 x10E3/uL   Immature Granulocytes 0 Not Estab. %   Immature Grans (Abs) 0.0 0.0 - 0.1  x10E3/uL   Hematology Comments: Note:     Assessment and Plan:     ICD-10-CM   1. Healthcare maintenance  Z00.00     2. At high risk for osteoporosis  Z91.89 DG Bone Density    3. 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 Colon cancer screening  Z12.11 Ambulatory referral to Gastroenterology    5. Screening for diabetes mellitus  [id number] Basic metabolic panel    Hemoglobin A1c    6. Screening, lipid  Z13.220 Lipid panel    7. Screening for malignant neoplasm of prostate  Z12.5 PSA, Total with Reflex to PSA, Free    8. Encounter for long-term (current) use of medications  Z79.899 Hepatic function panel     High level of concern for BCC vs SCC of R ear, and consult derm  F/u colon needed  He declines all vaccines. The patient declines routine health maintenance services noted. We reviewed that could lead to missing significant problems that could affect there mortality. The patient indicated that they understood this and was willing to accept those risks.   Patient Instructions  Vitamin D: 2,000 units each day Calcium 1,000 mg a day   Health Maintenance Exam: The patient's preventative maintenance and recommended screening tests for an annual wellness exam were reviewed in full today. Brought up to date unless services declined.  Counselled on the importance of diet, exercise, and its role in overall health and mortality. The patient's FH and SH was reviewed, including their home life, tobacco status, and drug and alcohol status.  Follow-up in 1 year for physical exam or additional follow-up below.  Follow-up: No follow-ups on file. Or follow-up in 1 year if not noted.  No orders of the defined types were placed in this encounter.  Medications Discontinued During This Encounter  Medication Reason  . Vitamin D, Ergocalciferol, (DRISDOL) 1.25 MG (50000 UNIT) CAPS capsule Completed Course  . sildenafil (VIAGRA) 25 MG tablet Completed  Course   Orders Placed This Encounter  Procedures  . DG Bone Density  . Basic metabolic panel  . Hepatic function panel  . Hemoglobin A1c  . Lipid panel  . PSA, Total with Reflex to PSA, Free  . Ambulatory referral to Dermatology  . Ambulatory referral to Gastroenterology     Signed,  Frederico Hamman T. Andrell Tallman, MD   Allergies as of 04/01/2021       Reactions   Esomeprazole Magnesium    REACTION: abdominal pain   Simvastatin    Wasn't working for pt   Alum Hydroxide-mag Carbonate    Medication interaction only        Medication List        Accurate as of April 01, 2021  4:47 PM. If you have any questions, ask your nurse or doctor.          STOP taking these medications    sildenafil 25 MG tablet Commonly known as: VIAGRA Stopped by: Bangert Loffler, MD   Vitamin D (Ergocalciferol) 1.25 MG (50000 UNIT) Caps capsule Commonly known as: DRISDOL Stopped by: Karasik Loffler, MD       TAKE these medications    GOODY HEADACHE PO Take by mouth as needed.   lansoprazole 30 MG capsule Commonly known as: PREVACID TAKE 1 CAPSULE (30 MG TOTAL) BY MOUTH DAILY BEFORE BREAKFAST.   phenytoin 100 MG ER capsule Commonly known as: DILANTIN TAKE 2 CAPSULES BY MOUTH EVERY MORNING AND TAKE 3 CAPSULES AT BEDTIME   ROBITUSSIN DM PO Take by mouth.

## 2021-04-02 ENCOUNTER — Telehealth: Payer: Self-pay | Admitting: *Deleted

## 2021-04-02 ENCOUNTER — Telehealth: Payer: Self-pay

## 2021-04-02 LAB — BASIC METABOLIC PANEL
BUN: 17 mg/dL (ref 6–23)
CO2: 32 mEq/L (ref 19–32)
Calcium: 10.1 mg/dL (ref 8.4–10.5)
Chloride: 103 mEq/L (ref 96–112)
Creatinine, Ser: 1.01 mg/dL (ref 0.40–1.50)
GFR: 81.06 mL/min (ref >60.00)
Glucose, Bld: 90 mg/dL (ref 70–99)
Potassium: 5 mEq/L (ref 3.5–5.1)
Sodium: 142 mEq/L (ref 135–145)

## 2021-04-02 LAB — LIPID PANEL
Cholesterol: 273 mg/dL — ABNORMAL HIGH (ref 0–200)
HDL: 89 mg/dL (ref >39.00)
LDL Cholesterol: 164 mg/dL — ABNORMAL HIGH (ref 0–99)
NonHDL: 183.98
Total CHOL/HDL Ratio: 3
Triglycerides: 100 mg/dL (ref 0.0–149.0)
VLDL: 20 mg/dL (ref 0.0–40.0)

## 2021-04-02 LAB — PSA: PSA: 1.08 ng/mL (ref 0.10–4.00)

## 2021-04-02 LAB — HEPATIC FUNCTION PANEL
ALT: 17 U/L (ref 0–53)
AST: 18 U/L (ref 0–37)
Albumin: 4.9 g/dL (ref 3.5–5.2)
Alkaline Phosphatase: 76 U/L (ref 39–117)
Bilirubin, Direct: 0.1 mg/dL (ref 0.0–0.3)
Total Bilirubin: 0.5 mg/dL (ref 0.2–1.2)
Total Protein: 7.2 g/dL (ref 6.0–8.3)

## 2021-04-02 LAB — PHENYTOIN LEVEL, TOTAL: Phenytoin (Dilantin), Serum: 21 ug/mL (ref 10.0–20.0)

## 2021-04-02 LAB — HEMOGLOBIN A1C: Hgb A1c MFr Bld: 5.2 % (ref 4.6–6.5)

## 2021-04-02 NOTE — Telephone Encounter (Signed)
I called pt and LMVM for him re: results, looked better. Get trough levels at follow up appt (draw lab prior to taking medication).  He is to call back if questions.  No change in current therapy.

## 2021-04-02 NOTE — Telephone Encounter (Signed)
LVM requesting call back for lab results.  

## 2021-04-02 NOTE — Telephone Encounter (Signed)
-----   Message from Debbora Presto, NP sent at 04/02/2021  8:06 AM EDT ----- Please let him know that his Dilantin level looks better! We will try to get trough levels at follow up appts. We will continue current treatment plan.

## 2021-04-02 NOTE — Telephone Encounter (Signed)
I called patient to discuss. No answer, left a voicemail asking him to call me back. 

## 2021-04-03 ENCOUNTER — Encounter: Payer: Self-pay | Admitting: *Deleted

## 2021-04-09 NOTE — Telephone Encounter (Signed)
I do not think that Mr. Wayne Vang uses the [employer].  I just noticed your message.

## 2021-04-10 ENCOUNTER — Encounter: Payer: Self-pay | Admitting: *Deleted

## 2021-04-10 NOTE — Telephone Encounter (Addendum)
We had reached out to the patient a few times via telephone and no response. We noticed that he had communicated in the past via mychart so we attempted to reach him via mychart.   We have finally reached him via telephone. Awaiting his decision of what he wants to do as far as the referral to Derm We sent referral to [facility] and [facility]   Dr Ledell Peoples office is booking out until November and later  04/05/21  Spoke with wife she is going to call Dr. Shepard [facility] office and call me back to let me know what they would like to do///ELEA

## 2021-05-02 ENCOUNTER — Other Ambulatory Visit: Payer: Self-pay

## 2021-05-02 ENCOUNTER — Ambulatory Visit
Admission: RE | Admit: 2021-05-02 | Discharge: 2021-05-02 | Disposition: A | Discharge status: Home / Self Care | Payer: 59 | Source: Ambulatory Visit | Attending: Family Medicine | Admitting: Family Medicine

## 2021-05-02 DIAGNOSIS — Z9189 Other specified personal risk factors, not elsewhere classified: Secondary | ICD-10-CM

## 2021-05-22 ENCOUNTER — Other Ambulatory Visit: Payer: Self-pay | Admitting: Family Medicine

## 2021-06-06 ENCOUNTER — Encounter: Payer: Self-pay | Admitting: Gastroenterology

## 2021-08-06 ENCOUNTER — Other Ambulatory Visit: Payer: Self-pay

## 2021-08-06 ENCOUNTER — Ambulatory Visit (AMBULATORY_SURGERY_CENTER): Payer: 59 | Admitting: *Deleted

## 2021-08-06 VITALS — Ht 74.0 in | Wt 236.0 lb

## 2021-08-06 DIAGNOSIS — Z86010 Personal history of colonic polyps: Secondary | ICD-10-CM

## 2021-08-06 MED ORDER — NA SULFATE-K SULFATE-MG SULF 17.5-3.13-1.6 GM/177ML PO SOLN
1.0000 | Freq: Once | ORAL | 0 refills | Status: AC
Start: 2021-08-06 — End: 2021-08-06

## 2021-08-06 NOTE — Progress Notes (Signed)

## 2021-08-16 ENCOUNTER — Encounter: Payer: Self-pay | Admitting: Gastroenterology

## 2021-08-20 ENCOUNTER — Ambulatory Visit (AMBULATORY_SURGERY_CENTER): Payer: BC Managed Care – PPO | Admitting: Gastroenterology

## 2021-08-20 ENCOUNTER — Encounter: Payer: BC Managed Care – PPO | Admitting: Gastroenterology

## 2021-08-20 ENCOUNTER — Other Ambulatory Visit: Payer: Self-pay

## 2021-08-20 ENCOUNTER — Encounter: Payer: Self-pay | Admitting: Gastroenterology

## 2021-08-20 VITALS — BP 141/82 | HR 77 | Temp 98.1°F | Resp 16 | Ht 74.0 in | Wt 236.0 lb

## 2021-08-20 DIAGNOSIS — Z86010 Personal history of colonic polyps: Secondary | ICD-10-CM | POA: Diagnosis not present

## 2021-08-20 DIAGNOSIS — D125 Benign neoplasm of sigmoid colon: Secondary | ICD-10-CM

## 2021-08-20 DIAGNOSIS — K635 Polyp of colon: Secondary | ICD-10-CM | POA: Diagnosis not present

## 2021-08-20 DIAGNOSIS — Z1211 Encounter for screening for malignant neoplasm of colon: Secondary | ICD-10-CM | POA: Diagnosis not present

## 2021-08-20 MED ORDER — SODIUM CHLORIDE 0.9 % IV SOLN: 500.0000 mL | INTRAVENOUS | Status: DC

## 2021-08-20 NOTE — Progress Notes (Signed)
[facility] History and Physical   Primary Care Physician:  Fontes Loffler, MD   Reason for Procedure:  History of adenomatous colon polyps  Plan:    Surveillance colonoscopy with possible interventions as needed     HPI: Wayne Vang is a very pleasant 61 y.o. male here for surveillance colonoscopy. Denies any nausea, vomiting, abdominal pain, melena or bright red blood per rectum  The risks and benefits as well as alternatives of endoscopic procedure(s) have been discussed and reviewed. All questions answered. The patient agrees to proceed.    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 Cornett, MD;  Location: [place];  Service: General;  Laterality: Right;   INGUINAL HERNIA REPAIR Left 07/21/2013   INSERTION OF MESH N/A 12/06/2013   Procedure: INSERTION OF MESH;  Surgeon: Joyice Faster. Cornett, MD;  Location: [place];  Service: General;  Laterality: N/A;   NO PAST SURGERIES     POLYPECTOMY      Prior to Admission medications   Medication Sig Start Date End Date Taking? Authorizing Provider  Aspirin-Acetaminophen-Caffeine (GOODY HEADACHE PO) Take by mouth as needed.   Yes [provider]  lansoprazole (PREVACID) 30 MG capsule TAKE 1 CAPSULE BY MOUTH DAILY BEFORE BREAKFAST. 05/22/21  Yes Copland, Frederico Hamman, MD  phenytoin (DILANTIN) 100 MG ER capsule TAKE 2 CAPSULES BY MOUTH EVERY MORNING AND TAKE 3 CAPSULES AT BEDTIME 03/27/21  Yes Lomax, Amy, NP  potassium chloride (KLOR-CON) 10 MEQ tablet Take 10 mEq by mouth daily.   Yes [provider]  Vitamin  D, Ergocalciferol, (DRISDOL) 1.25 MG (50000 UNIT) CAPS capsule Take 50,000 Units by mouth daily.   Yes [provider]  Dextromethorphan-guaiFENesin (ROBITUSSIN DM PO) Take by mouth.    [provider]    Current Outpatient Medications  Medication Sig Dispense Refill   Aspirin-Acetaminophen-Caffeine (GOODY HEADACHE PO) Take by mouth as needed.     lansoprazole (PREVACID) 30 MG capsule TAKE 1 CAPSULE BY MOUTH DAILY BEFORE BREAKFAST. 90 capsule 3   phenytoin (DILANTIN) 100 MG ER capsule TAKE 2 CAPSULES BY MOUTH EVERY MORNING AND TAKE 3 CAPSULES AT BEDTIME 450 capsule 4   potassium chloride (KLOR-CON) 10 MEQ tablet Take 10 mEq by mouth daily.     Vitamin D, Ergocalciferol, (DRISDOL) 1.25 MG (50000 UNIT) CAPS capsule Take 50,000 Units by mouth daily.     Dextromethorphan-guaiFENesin (ROBITUSSIN DM PO) Take by mouth.     Current Facility-Administered Medications  Medication Dose Route Frequency Provider Last Rate Last Admin   0.9 %  sodium chloride infusion  500 mL Intravenous Continuous Lehua Flores, Venia Minks, MD        Allergies as of 08/20/2021 - Review Complete 08/20/2021  Allergen Reaction Noted   Esomeprazole magnesium     Simvastatin  12/07/2012   Alum hydroxide-mag carbonate      Family History  Problem Relation Age of Onset   Alzheimer's disease Mother    Cancer Father    Colon polyps Sister  Stroke Sister    Alcohol abuse Brother    Colon cancer Neg Hx    Esophageal cancer Neg Hx    Stomach cancer Neg Hx    Rectal cancer Neg Hx     Social History   Socioeconomic History   Marital status: Married    Spouse name: Not on file   Number of children: 1   Years of education: Not on file   Highest education level: Not on file  Occupational History   Occupation: Shop Printmaker: [employer]    Comment: [employer]  Tobacco Use   Smoking status: Former    Types: Cigarettes    Quit date: 07/21/2002    Years since quitting:  19.0   Smokeless tobacco: Never  Vaping Use   Vaping Use: Never used  Substance and Sexual Activity   Alcohol use: Yes    Comment: socially   Drug use: No   Sexual activity: Not on file  Other Topics Concern   Not on file  Social History Narrative      He has been married 31+ years.   Caffeine Use: He drinks coke, tea, and coffee.   Truck Dealer   Social Determinants of Radio broadcast assistant Strain: Not on file  Food Insecurity: Not on file  Transportation Needs: Not on file  Physical Activity: Not on file  Stress: Not on file  Social Connections: Not on file  Intimate Partner Violence: Not on file    Review of Systems:  All other review of systems negative except as mentioned in the HPI.  Physical Exam: Vital signs in last 24 hours: BP 126/80    Pulse 74    Temp 98.1 F (36.7 C)    Resp 13    Ht 6\' 2"  (1.88 m)    Wt 236 lb (107 kg)    SpO2 98%    BMI 30.30 kg/m  General:   Alert, NAD Lungs:  Clear .   Heart:  Regular rate and rhythm Abdomen:  Soft, nontender and nondistended. Neuro/Psych:  Alert and cooperative. Normal mood and affect. A and O x 3  Reviewed labs, radiology imaging, old records and pertinent past GI work up  Patient is appropriate for planned procedure(s) and anesthesia in an ambulatory setting   K. Denzil Magnuson , MD [phone]

## 2021-08-20 NOTE — Progress Notes (Signed)
Called to room to assist during endoscopic procedure.  Patient ID and intended procedure confirmed with present staff. Received instructions for my participation in the procedure from the performing physician.  

## 2021-08-20 NOTE — Progress Notes (Signed)
VSS, transported to PACU °

## 2021-08-20 NOTE — Progress Notes (Signed)
Progress Note:  Pt c/o gas pain. Administered Levsin po with some relief and passing gas. Then pt c/o increased lower abd pain despite passing gas and sitting up.  Dr. Silverio Decamp notified and examined pt.  Mylicon administered and pt. Walked to restroom.He passed more gas and pain much less.

## 2021-08-20 NOTE — Op Note (Signed)
[facility] Patient Name: Wayne Vang Procedure Date: 08/20/2021 7:31 AM MRN: [id number] Endoscopist: Mauri Pole , MD Age: 61 Referring MD:  Date of Birth: Dec 10, 1960 Gender: Male Account #: [id number] Procedure:                Colonoscopy Indications:              High risk colon cancer surveillance: Personal                            history of colonic polyps, High risk colon cancer                            surveillance: Personal history of adenoma (10 mm or                            greater in size), High risk colon cancer                            surveillance: Personal history of multiple (3 or                            more) adenomas Medicines:                Monitored Anesthesia Care Procedure:                Pre-Anesthesia Assessment:                           - Prior to the procedure, a History and Physical                            was performed, and patient medications and                            allergies were reviewed. The patient's tolerance of                            previous anesthesia was also reviewed. The risks                            and benefits of the procedure and the sedation                            options and risks were discussed with the patient.                            All questions were answered, and informed consent                            was obtained. Prior Anticoagulants: The patient has                            taken no previous anticoagulant or antiplatelet  agents. ASA Grade Assessment: II - A patient with                            mild systemic disease. After reviewing the risks                            and benefits, the patient was deemed in                            satisfactory condition to undergo the procedure.                           After obtaining informed consent, the colonoscope                            was passed under direct vision. Throughout the                             procedure, the patient's blood pressure, pulse, and                            oxygen saturations were monitored continuously. The                            PCF-HQ190L Colonoscope was introduced through the                            anus and advanced to the the cecum, identified by                            appendiceal orifice and ileocecal valve. The                            colonoscopy was performed without difficulty. The                            patient tolerated the procedure well. The quality                            of the bowel preparation was excellent. The                            ileocecal valve, appendiceal orifice, and rectum                            were photographed. Scope In: 8:13:36 AM Scope Out: 8:32:49 AM Scope Withdrawal Time: 0 hours 9 minutes 43 seconds  Total Procedure Duration: 0 hours 19 minutes 13 seconds  Findings:                 The perianal and digital rectal examinations were                            normal.  A 7 mm polyp was found in the sigmoid colon. The                            polyp was sessile. The polyp was removed with a                            cold snare. Resection and retrieval were complete.                           A few small-mouthed diverticula were found in the                            sigmoid colon.                           Non-bleeding external and internal hemorrhoids were                            found during retroflexion. The hemorrhoids were                            large. Complications:            No immediate complications. Estimated Blood Loss:     Estimated blood loss was minimal. Impression:               - One 7 mm polyp in the sigmoid colon, removed with                            a cold snare. Resected and retrieved.                           - Diverticulosis in the sigmoid colon.                           - Non-bleeding external and internal  hemorrhoids. Recommendation:           - Patient has a contact number available for                            emergencies. The signs and symptoms of potential                            delayed complications were discussed with the                            patient. Return to normal activities tomorrow.                            Written discharge instructions were provided to the                            patient.                           - Resume previous diet.                           -  Continue present medications.                           - Await pathology results.                           - Repeat colonoscopy in 5 years for surveillance                            based on pathology results. Mauri Pole, MD 08/20/2021 8:39:15 AM This report has been signed electronically.

## 2021-08-20 NOTE — Patient Instructions (Signed)
Handouts given for diverticulosis and polyps.  Resume previous diet.  Await pathology results.   YOU HAD AN ENDOSCOPIC PROCEDURE TODAY AT [facility]:   Refer to the procedure report that was given to you for any specific questions about what was found during the examination.  If the procedure report does not answer your questions, please call your gastroenterologist to clarify.  If you requested that your care partner not be given the details of your procedure findings, then the procedure report has been included in a sealed envelope for you to review at your convenience later.  YOU SHOULD EXPECT: Some feelings of bloating in the abdomen. Passage of more gas than usual.  Walking can help get rid of the air that was put into your GI tract during the procedure and reduce the bloating. If you had a lower endoscopy (such as a colonoscopy or flexible sigmoidoscopy) you may notice spotting of blood in your stool or on the toilet paper. If you underwent a bowel prep for your procedure, you may not have a normal bowel movement for a few days.  Please Note:  You might notice some irritation and congestion in your nose or some drainage.  This is from the oxygen used during your procedure.  There is no need for concern and it should clear up in a day or so.  SYMPTOMS TO REPORT IMMEDIATELY:  Following lower endoscopy (colonoscopy or flexible sigmoidoscopy):  Excessive amounts of blood in the stool  Significant tenderness or worsening of abdominal pains  Swelling of the abdomen that is new, acute  Fever of 100F or higher   For urgent or emergent issues, a gastroenterologist can be reached at any hour by calling [phone]. Do not use MyChart messaging for urgent concerns.    DIET:  We do recommend a small meal at first, but then you may proceed to your regular diet.  Drink plenty of fluids but you should avoid alcoholic beverages for 24 hours.  ACTIVITY:  You should plan to take it  easy for the rest of today and you should NOT DRIVE or use heavy machinery until tomorrow (because of the sedation medicines used during the test).    FOLLOW UP: Our staff will call the number listed on your records 48-72 hours following your procedure to check on you and address any questions or concerns that you may have regarding the information given to you following your procedure. If we do not reach you, we will leave a message.  We will attempt to reach you two times.  During this call, we will ask if you have developed any symptoms of COVID 19. If you develop any symptoms (ie: fever, flu-like symptoms, shortness of breath, cough etc.) before then, please call [phone].  If you test positive for Covid 19 in the 2 weeks post procedure, please call and report this information to [place].    If any biopsies were taken you will be contacted by phone or by letter within the next 1-3 weeks.  Please call us at [phone] if you have not heard about the biopsies in 3 weeks.    SIGNATURES/CONFIDENTIALITY: You and/or your care partner have signed paperwork which will be entered into your electronic medical record.  These signatures attest to the fact that that the information above on your After Visit Summary has been reviewed and is understood.  Full responsibility of the confidentiality of this discharge information lies with you and/or your care-partner.

## 2021-08-22 ENCOUNTER — Telehealth: Payer: Self-pay | Admitting: *Deleted

## 2021-08-22 NOTE — Telephone Encounter (Signed)
°  Follow up Call-  Call back number 08/20/2021  Post procedure Call Back phone  # [phone]  Permission to leave phone message Yes  Some recent data might be hidden     Patient questions:  Do you have a fever, pain , or abdominal swelling?  Some abdominal cramping, but "much better"  Didn't feel need to rate Pain Score  0 *  Have you tolerated food without any problems? Yes.    Have you been able to return to your normal activities? Yes.    Do you have any questions about your discharge instructions: Diet   No. Medications  No. Follow up visit  No.  Do you have questions or concerns about your Care? No.  Actions: * If pain score is 4 or above: No action needed, pain <4.  Have you developed a fever since your procedure? no  2.   Have you had an respiratory symptoms (SOB or cough) since your procedure? no  3.   Have you tested positive for COVID 19 since your procedure no  4.   Have you had any family members/close contacts diagnosed with the COVID 19 since your procedure?  no   If yes to any of these questions please route to Joylene John, RN and Joella Prince, RN

## 2021-08-28 DIAGNOSIS — C4491 Basal cell carcinoma of skin, unspecified: Secondary | ICD-10-CM | POA: Diagnosis not present

## 2021-08-28 DIAGNOSIS — Z481 Encounter for planned postprocedural wound closure: Secondary | ICD-10-CM | POA: Diagnosis not present

## 2021-08-28 DIAGNOSIS — C44212 Basal cell carcinoma of skin of right ear and external auricular canal: Secondary | ICD-10-CM | POA: Diagnosis not present

## 2021-09-05 ENCOUNTER — Encounter: Payer: Self-pay | Admitting: Gastroenterology

## 2021-10-17 ENCOUNTER — Other Ambulatory Visit: Payer: Self-pay

## 2021-10-17 MED ORDER — PHENYTOIN SODIUM EXTENDED 100 MG PO CAPS: ORAL_CAPSULE | ORAL | 1 refills | Status: DC

## 2021-10-17 NOTE — Addendum Note (Signed)
Addended by: Georgiann Cocker on: 10/17/2021 03:54 PM ? ? Modules accepted: Orders ? ?

## 2021-10-18 ENCOUNTER — Other Ambulatory Visit: Payer: Self-pay | Admitting: *Deleted

## 2021-10-18 MED ORDER — LANSOPRAZOLE 30 MG PO CPDR: DELAYED_RELEASE_CAPSULE | ORAL | 1 refills | Status: DC

## 2021-12-24 DIAGNOSIS — L821 Other seborrheic keratosis: Secondary | ICD-10-CM | POA: Diagnosis not present

## 2021-12-24 DIAGNOSIS — D225 Melanocytic nevi of trunk: Secondary | ICD-10-CM | POA: Diagnosis not present

## 2021-12-24 DIAGNOSIS — D492 Neoplasm of unspecified behavior of bone, soft tissue, and skin: Secondary | ICD-10-CM | POA: Diagnosis not present

## 2021-12-24 DIAGNOSIS — C44612 Basal cell carcinoma of skin of right upper limb, including shoulder: Secondary | ICD-10-CM | POA: Diagnosis not present

## 2021-12-24 DIAGNOSIS — L814 Other melanin hyperpigmentation: Secondary | ICD-10-CM | POA: Diagnosis not present

## 2022-03-19 ENCOUNTER — Emergency Department (HOSPITAL_COMMUNITY): Payer: BC Managed Care – PPO

## 2022-03-19 ENCOUNTER — Other Ambulatory Visit: Payer: Self-pay

## 2022-03-19 ENCOUNTER — Inpatient Hospital Stay (HOSPITAL_COMMUNITY)
Admission: EM | Admit: 2022-03-19 | Discharge: 2022-03-30 | DRG: 234 | Disposition: A | Discharge status: Home / Self Care | Payer: BC Managed Care – PPO | Attending: Thoracic Surgery (Cardiothoracic Vascular Surgery) | Admitting: Thoracic Surgery (Cardiothoracic Vascular Surgery)

## 2022-03-19 ENCOUNTER — Telehealth: Payer: Self-pay | Admitting: Family Medicine

## 2022-03-19 ENCOUNTER — Encounter (HOSPITAL_COMMUNITY): Payer: Self-pay

## 2022-03-19 DIAGNOSIS — Z809 Family history of malignant neoplasm, unspecified: Secondary | ICD-10-CM

## 2022-03-19 DIAGNOSIS — Z8249 Family history of ischemic heart disease and other diseases of the circulatory system: Secondary | ICD-10-CM | POA: Diagnosis not present

## 2022-03-19 DIAGNOSIS — K219 Gastro-esophageal reflux disease without esophagitis: Secondary | ICD-10-CM | POA: Diagnosis present

## 2022-03-19 DIAGNOSIS — R569 Unspecified convulsions: Secondary | ICD-10-CM | POA: Diagnosis not present

## 2022-03-19 DIAGNOSIS — J90 Pleural effusion, not elsewhere classified: Secondary | ICD-10-CM | POA: Diagnosis not present

## 2022-03-19 DIAGNOSIS — I5020 Unspecified systolic (congestive) heart failure: Secondary | ICD-10-CM | POA: Diagnosis not present

## 2022-03-19 DIAGNOSIS — Z820 Family history of epilepsy and other diseases of the nervous system: Secondary | ICD-10-CM | POA: Diagnosis not present

## 2022-03-19 DIAGNOSIS — Z87891 Personal history of nicotine dependence: Secondary | ICD-10-CM

## 2022-03-19 DIAGNOSIS — Z951 Presence of aortocoronary bypass graft: Secondary | ICD-10-CM | POA: Diagnosis not present

## 2022-03-19 DIAGNOSIS — R739 Hyperglycemia, unspecified: Secondary | ICD-10-CM | POA: Diagnosis not present

## 2022-03-19 DIAGNOSIS — E785 Hyperlipidemia, unspecified: Secondary | ICD-10-CM | POA: Diagnosis not present

## 2022-03-19 DIAGNOSIS — Z20822 Contact with and (suspected) exposure to covid-19: Secondary | ICD-10-CM | POA: Diagnosis not present

## 2022-03-19 DIAGNOSIS — Z79899 Other long term (current) drug therapy: Secondary | ICD-10-CM

## 2022-03-19 DIAGNOSIS — K5900 Constipation, unspecified: Secondary | ICD-10-CM | POA: Diagnosis present

## 2022-03-19 DIAGNOSIS — I25110 Atherosclerotic heart disease of native coronary artery with unstable angina pectoris: Secondary | ICD-10-CM | POA: Diagnosis present

## 2022-03-19 DIAGNOSIS — I252 Old myocardial infarction: Secondary | ICD-10-CM | POA: Diagnosis not present

## 2022-03-19 DIAGNOSIS — I2510 Atherosclerotic heart disease of native coronary artery without angina pectoris: Secondary | ICD-10-CM | POA: Diagnosis not present

## 2022-03-19 DIAGNOSIS — R0789 Other chest pain: Secondary | ICD-10-CM | POA: Diagnosis not present

## 2022-03-19 DIAGNOSIS — E8770 Fluid overload, unspecified: Secondary | ICD-10-CM | POA: Diagnosis not present

## 2022-03-19 DIAGNOSIS — D62 Acute posthemorrhagic anemia: Secondary | ICD-10-CM | POA: Diagnosis not present

## 2022-03-19 DIAGNOSIS — I213 ST elevation (STEMI) myocardial infarction of unspecified site: Secondary | ICD-10-CM | POA: Diagnosis present

## 2022-03-19 DIAGNOSIS — Z811 Family history of alcohol abuse and dependence: Secondary | ICD-10-CM | POA: Diagnosis not present

## 2022-03-19 DIAGNOSIS — D6959 Other secondary thrombocytopenia: Secondary | ICD-10-CM | POA: Diagnosis not present

## 2022-03-19 DIAGNOSIS — Z01810 Encounter for preprocedural cardiovascular examination: Secondary | ICD-10-CM | POA: Diagnosis not present

## 2022-03-19 DIAGNOSIS — G40909 Epilepsy, unspecified, not intractable, without status epilepticus: Secondary | ICD-10-CM | POA: Diagnosis present

## 2022-03-19 DIAGNOSIS — R079 Chest pain, unspecified: Secondary | ICD-10-CM | POA: Diagnosis not present

## 2022-03-19 DIAGNOSIS — I214 Non-ST elevation (NSTEMI) myocardial infarction: Principal | ICD-10-CM | POA: Diagnosis present

## 2022-03-19 DIAGNOSIS — Z823 Family history of stroke: Secondary | ICD-10-CM | POA: Diagnosis not present

## 2022-03-19 DIAGNOSIS — J9811 Atelectasis: Secondary | ICD-10-CM | POA: Diagnosis not present

## 2022-03-19 DIAGNOSIS — E782 Mixed hyperlipidemia: Secondary | ICD-10-CM | POA: Diagnosis not present

## 2022-03-19 DIAGNOSIS — I200 Unstable angina: Secondary | ICD-10-CM | POA: Diagnosis not present

## 2022-03-19 DIAGNOSIS — I083 Combined rheumatic disorders of mitral, aortic and tricuspid valves: Secondary | ICD-10-CM | POA: Diagnosis not present

## 2022-03-19 DIAGNOSIS — D649 Anemia, unspecified: Secondary | ICD-10-CM | POA: Diagnosis not present

## 2022-03-19 DIAGNOSIS — Z9911 Dependence on respirator [ventilator] status: Secondary | ICD-10-CM | POA: Diagnosis not present

## 2022-03-19 LAB — COMPREHENSIVE METABOLIC PANEL
ALT: 20 U/L (ref 0–44)
AST: 27 U/L (ref 15–41)
Albumin: 4.6 g/dL (ref 3.5–5.0)
Alkaline Phosphatase: 72 U/L (ref 38–126)
Anion gap: 14 (ref 5–15)
BUN: 12 mg/dL (ref 8–23)
CO2: 26 mmol/L (ref 22–32)
Calcium: 9.6 mg/dL (ref 8.9–10.3)
Chloride: 101 mmol/L (ref 98–111)
Creatinine, Ser: 1 mg/dL (ref 0.61–1.24)
GFR, Estimated: >60 mL/min (ref >60)
Glucose, Bld: 95 mg/dL (ref 70–99)
Potassium: 3.9 mmol/L (ref 3.5–5.1)
Sodium: 141 mmol/L (ref 135–145)
Total Bilirubin: 0.7 mg/dL (ref 0.3–1.2)
Total Protein: 6.7 g/dL (ref 6.5–8.1)

## 2022-03-19 LAB — CBC
HCT: 38.9 % — ABNORMAL LOW (ref 39.0–52.0)
Hemoglobin: 14.4 g/dL (ref 13.0–17.0)
MCH: 31.9 pg (ref 26.0–34.0)
MCHC: 37 g/dL — ABNORMAL HIGH (ref 30.0–36.0)
MCV: 86.3 fL (ref 80.0–100.0)
Platelets: 172 10*3/uL (ref 150–400)
RBC: 4.51 MIL/uL (ref 4.22–5.81)
RDW: 11.9 % (ref 11.5–15.5)
WBC: 6.4 10*3/uL (ref 4.0–10.5)
nRBC: 0 % (ref 0.0–0.2)

## 2022-03-19 LAB — LIPASE, BLOOD: Lipase: 31 U/L (ref 11–51)

## 2022-03-19 LAB — TROPONIN I (HIGH SENSITIVITY)
Troponin I (High Sensitivity): 42 ng/L — ABNORMAL HIGH (ref <18)
Troponin I (High Sensitivity): 52 ng/L — ABNORMAL HIGH (ref <18)

## 2022-03-19 MED ORDER — PANTOPRAZOLE SODIUM 40 MG PO TBEC
40.0000 mg | DELAYED_RELEASE_TABLET | Freq: Every day | ORAL | Status: DC
  Administered 2022-03-20 – 2022-03-25 (×6): 40 mg via ORAL
  Filled 2022-03-19 (×6): qty 1

## 2022-03-19 MED ORDER — SODIUM CHLORIDE 0.9 % WEIGHT BASED INFUSION
1.0000 mL/kg/h | INTRAVENOUS | Status: DC
  Administered 2022-03-20 (×2): 1 mL/kg/h via INTRAVENOUS

## 2022-03-19 MED ORDER — SODIUM CHLORIDE 0.9% FLUSH
3.0000 mL | Freq: Two times a day (BID) | INTRAVENOUS | Status: DC
  Administered 2022-03-19 – 2022-03-22 (×4): 3 mL via INTRAVENOUS

## 2022-03-19 MED ORDER — PHENYTOIN SODIUM EXTENDED 100 MG PO CAPS
300.0000 mg | ORAL_CAPSULE | Freq: Every day | ORAL | Status: DC
  Administered 2022-03-19 – 2022-03-25 (×7): 300 mg via ORAL
  Filled 2022-03-19 (×8): qty 3

## 2022-03-19 MED ORDER — ROSUVASTATIN CALCIUM 5 MG PO TABS
5.0000 mg | ORAL_TABLET | Freq: Every day | ORAL | Status: DC
  Administered 2022-03-20: 5 mg via ORAL
  Filled 2022-03-19: qty 1

## 2022-03-19 MED ORDER — HEPARIN SODIUM (PORCINE) 5000 UNIT/ML IJ SOLN
4000.0000 [IU] | Freq: Once | INTRAMUSCULAR | Status: AC
  Administered 2022-03-19: 4000 [IU] via INTRAVENOUS

## 2022-03-19 MED ORDER — HEPARIN (PORCINE) 25000 UT/250ML-% IV SOLN
1200.0000 [IU]/h | INTRAVENOUS | Status: DC
Start: 2022-03-19 — End: 2022-03-20
  Administered 2022-03-19 (×2): 1400 [IU]/h via INTRAVENOUS
  Administered 2022-03-20: 1200 [IU]/h via INTRAVENOUS
  Filled 2022-03-19 (×2): qty 250

## 2022-03-19 MED ORDER — ASPIRIN 325 MG PO TABS
325.0000 mg | ORAL_TABLET | Freq: Once | ORAL | Status: AC
  Administered 2022-03-19: 325 mg via ORAL
  Filled 2022-03-19: qty 1

## 2022-03-19 MED ORDER — SODIUM CHLORIDE 0.9% FLUSH: 3.0000 mL | INTRAVENOUS | Status: DC | PRN

## 2022-03-19 MED ORDER — SODIUM CHLORIDE 0.9 % IV SOLN: 250.0000 mL | INTRAVENOUS | Status: DC | PRN

## 2022-03-19 MED ORDER — SODIUM CHLORIDE 0.9 % WEIGHT BASED INFUSION
3.0000 mL/kg/h | INTRAVENOUS | Status: DC
  Administered 2022-03-20: 3 mL/kg/h via INTRAVENOUS

## 2022-03-19 MED ORDER — NITROGLYCERIN 0.4 MG SL SUBL: 0.4000 mg | SUBLINGUAL_TABLET | SUBLINGUAL | Status: DC | PRN

## 2022-03-19 MED ORDER — PHENYTOIN SODIUM EXTENDED 100 MG PO CAPS
200.0000 mg | ORAL_CAPSULE | Freq: Every morning | ORAL | Status: DC
Start: 2022-03-20 — End: 2022-03-26
  Administered 2022-03-20 – 2022-03-26 (×7): 200 mg via ORAL
  Filled 2022-03-19 (×8): qty 2

## 2022-03-19 MED ORDER — PHENYTOIN SODIUM EXTENDED 100 MG PO CAPS: 300.0000 mg | ORAL_CAPSULE | Freq: Every day | ORAL | Status: DC

## 2022-03-19 MED ORDER — ASPIRIN 81 MG PO TBEC
81.0000 mg | DELAYED_RELEASE_TABLET | Freq: Every day | ORAL | Status: DC
  Administered 2022-03-21 – 2022-03-25 (×5): 81 mg via ORAL
  Filled 2022-03-19 (×5): qty 1

## 2022-03-19 MED ORDER — ASPIRIN 81 MG PO TBEC: 81.0000 mg | DELAYED_RELEASE_TABLET | Freq: Every day | ORAL | Status: DC

## 2022-03-19 MED ORDER — ASPIRIN 81 MG PO CHEW
81.0000 mg | CHEWABLE_TABLET | ORAL | Status: AC
  Administered 2022-03-20: 81 mg via ORAL
  Filled 2022-03-19: qty 1

## 2022-03-19 MED ORDER — ACETAMINOPHEN 325 MG PO TABS: 650.0000 mg | ORAL_TABLET | ORAL | Status: DC | PRN

## 2022-03-19 MED ORDER — ONDANSETRON HCL 4 MG/2ML IJ SOLN: 4.0000 mg | Freq: Four times a day (QID) | INTRAMUSCULAR | Status: DC | PRN

## 2022-03-19 NOTE — H&P (Addendum)
Cardiology Admission History and Physical   Patient ID: Wayne Vang MRN: [id number]; DOB: August 04, 1960   Admission date: 03/19/2022  PCP:  Stfleur Loffler, MD   [facility] Cardiologist:  [facility] to [facility]   Chief Complaint:  Chest pressure  Patient Profile:   Wayne Vang is a 61 y.o. male with seizures, GERD, hiatal hernia who is being seen 03/19/2022 for the evaluation of chest pressure and elevated troponin.  History of Present Illness:   Wayne Vang had some exertional chest discomfort about 6 months ago which spontaneously resolved.  However he started to having regarding symptoms for past 1 to 2 months.  Now occurring with each exertion.  He works as a Dealer.  With activity he gets lower sternal squeezing chest pressure.  Relieved with rest.  No associated nausea, vomiting, diaphoresis or radiation.  Intensity and duration are worsening.  He had a worse episode today while at work.  He was walking uphill.  Needed to sit down and lasted longer than normal.  Came to ER for further evaluation.  Found to have elevated troponin and cardiology consulted.  No chest discomfort with swallowing.  Does not seems like typical GERD.  He denies palpitation, dizziness, orthopnea, PND, syncope, lower extremity edema or melena.  Father had MI in his late 68s.  Social alcohol drinking.  Denies tobacco smoking or illicit drug use.     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 Cornett, MD;  Location: [place];  Service: General;  Laterality: Right;   INGUINAL HERNIA REPAIR Left 07/21/2013    INSERTION OF MESH N/A 12/06/2013   Procedure: INSERTION OF MESH;  Surgeon: Joyice Faster. Cornett, MD;  Location: [place];  Service: General;  Laterality: N/A;   NO PAST SURGERIES     POLYPECTOMY       Medications Prior to Admission: Prior to Admission medications   Medication Sig Start Date End Date Taking? Authorizing Provider  Aspirin-Acetaminophen-Caffeine (GOODY HEADACHE PO) Take by mouth as needed.    [provider]  lansoprazole (PREVACID) 30 MG capsule TAKE 1 CAPSULE BY MOUTH DAILY BEFORE BREAKFAST. 10/18/21   Copland, Frederico Hamman, MD  phenytoin (DILANTIN) 100 MG ER capsule TAKE 2 CAPSULES BY MOUTH EVERY MORNING AND TAKE 3 CAPSULES AT BEDTIME 10/17/21   Lomax, Amy, NP  potassium chloride (KLOR-CON) 10 MEQ tablet Take 10 mEq by mouth daily.    [provider]  Vitamin D, Ergocalciferol, (DRISDOL) 1.25 MG (50000 UNIT) CAPS capsule Take 50,000 Units by mouth daily.    [provider]     Allergies:    Allergies  Allergen Reactions   Esomeprazole Magnesium     REACTION: abdominal pain   Simvastatin     Wasn't working for pt    Alum Hydroxide-Mag Carbonate     Medication interaction only    Social History:   Social History   Socioeconomic History   Marital status: Married    Spouse name: Not on file  Number of children: 1   Years of education: Not on file   Highest education level: Not on file  Occupational History   Occupation: Shop Printmaker: [employer]    Comment: [employer]  Tobacco Use   Smoking status: Former    Types: Cigarettes    Quit date: 07/21/2002    Years since quitting: 19.6   Smokeless tobacco: Never  Vaping Use   Vaping Use: Never used  Substance and Sexual Activity   Alcohol use: Yes    Comment: socially   Drug use: No   Sexual activity: Not on file  Other Topics Concern   Not on file  Social History Narrative      He has been married 31+ years.   Caffeine Use: He drinks  coke, tea, and coffee.   Truck Dealer   Social Determinants of Radio broadcast assistant Strain: Not on file  Food Insecurity: Not on file  Transportation Needs: Not on file  Physical Activity: Not on file  Stress: Not on file  Social Connections: Not on file  Intimate Partner Violence: Not on file    Family History:   The patient's family history includes Alcohol abuse in his brother; Alzheimer's disease in his mother; Cancer in his father; Colon polyps in his sister; Stroke in his sister. There is no history of Colon cancer, Esophageal cancer, Stomach cancer, or Rectal cancer.    ROS:  Please see the history of present illness.  All other ROS reviewed and negative.     Physical Exam/Data:   Vitals:   03/19/22 1630 03/19/22 1642 03/19/22 1730 03/19/22 1800  BP: 121/81  (!) 125/90 133/86  Pulse: 76  79 80  Resp: (!) '8  15 12  '$ Temp:      TempSrc:      SpO2: 94% 97% 98% 97%  Weight:      Height:       No intake or output data in the 24 hours ending 03/19/22 1858    03/19/2022    3:46 PM 08/20/2021    7:10 AM 08/06/2021    4:26 PM  Last 3 Weights  Weight (lbs) 235 lb 236 lb 236 lb  Weight (kg) 106.595 kg 107.049 kg 107.049 kg     Body mass index is 30.17 kg/m.  General:  Well nourished, well developed, in no acute distress HEENT: normal Neck: no JVD Vascular: No carotid bruits; Distal pulses 2+ bilaterally   Cardiac:  normal S1, S2; RRR; no murmur  Lungs:  clear to auscultation bilaterally, no wheezing, rhonchi or rales  Abd: soft, nontender, no hepatomegaly  Ext: no edema Musculoskeletal:  No deformities, BUE and BLE strength normal and equal Skin: warm and dry  Neuro:  CNs 2-12 intact, no focal abnormalities noted Psych:  Normal affect    EKG:  The ECG that was done  was personally reviewed and demonstrates NSR  Relevant CV Studies: N/A  Laboratory Data:  High Sensitivity Troponin:   Recent Labs  Lab 03/19/22 1707  TROPONINIHS 42*       Chemistry Recent Labs  Lab 03/19/22 1707  NA 141  K 3.9  CL 101  CO2 26  GLUCOSE 95  BUN 12  CREATININE 1.00  CALCIUM 9.6  GFRNONAA >60  ANIONGAP 14    Recent Labs  Lab 03/19/22 1707  PROT 6.7  ALBUMIN 4.6  AST 27  ALT 20  ALKPHOS 72  BILITOT 0.7   Lipids  No results for input(s): "CHOL", "TRIG", "HDL", "LABVLDL", "LDLCALC", "CHOLHDL" in the last 168 hours. Hematology Recent Labs  Lab 03/19/22 1707  WBC 6.4  RBC 4.51  HGB 14.4  HCT 38.9*  MCV 86.3  MCH 31.9  MCHC 37.0*  RDW 11.9  PLT 172    Radiology/Studies:  DG Chest Portable 1 View  Result Date: 03/19/2022 CLINICAL DATA:  Chest tightness EXAM: PORTABLE CHEST 1 VIEW COMPARISON:  Chest 11/27/2009 FINDINGS: The heart size and mediastinal contours are within normal limits. Both lungs are clear. The visualized skeletal structures are unremarkable. IMPRESSION: No active disease. Electronically Signed   By: Franchot Gallo M.D.   On: 03/19/2022 17:09     Assessment and Plan:   Non-STEMI - Symptoms concerning for exertional angina.  Intensity and duration are worsening.  Worse episode today at work while walking uphill.  Initial high sensitive troponin 42.  Start IV heparin.  Start aspirin and statin.  N.p.o. after midnight for cardiac catheterization tomorrow.  -As needed sublingual NTG -Cycle troponin and repeat EKG in AM -Check lipid panel -Check hemoglobin A1c -Get echo   Seizures: Reports has been well controlled on phenytoin.  Will continue home phenytoin (200 mg every morning, 300 mg nightly)  GERD: PPI    Risk Assessment/Risk Scores:   TIMI Risk Score for Unstable Angina or Non-ST Elevation MI:   The patient's TIMI risk score is 2, which indicates a 8% risk of all cause mortality, new or recurrent myocardial infarction or need for urgent revascularization in the next 14 days.{  Severity of Illness: The appropriate patient status for this patient is OBSERVATION. Observation status is judged to be  reasonable and necessary in order to provide the required intensity of service to ensure the patient's safety. The patient's presenting symptoms, physical exam findings, and initial radiographic and laboratory data in the context of their medical condition is felt to place them at decreased risk for further clinical deterioration. Furthermore, it is anticipated that the patient will be medically stable for discharge from the hospital within 2 midnights of admission.    For questions or updates, please contact [place] Please consult [email or web address] for contact info under     Jarrett Soho, [place]  03/19/2022 6:58 PM    Patient seen and examined.  Agree with above documentation.  Mr. Rise is a 61 year old male with a history of seizures, GERD, hiatal hernia, prior tobacco use who presents with chest pain.  CAD risk factors include prior tobacco use (smoked for 5 to 10 years about 1 pack/day but quit 25 years ago) and family history (father had MI in 83s).  He reports that over the last 6 months he has noted exertional chest pain.  Typically occurs when walking uphill.  Reports over the last week he has noted increased frequency and duration of chest pain, now occurring with less exertion.  Given these symptoms, prompted him to present to the ED today.  In the ED, initial vital signs notable for BP 125/87, pulse 86, SPO2 95% on room air.  Labs notable for creatinine 1.0, hemoglobin 14.4, platelets 172, troponin 42.  Chest x-ray unremarkable.  EKG shows sinus rhythm, rate 84, no ST abnormalities.  Telemetry shows sinus rhythm with rate 80s.  On exam, patient is alert and oriented, regular rate and rhythm, no murmurs, lungs CTAB, no LE edema or JVD.  His presentation is consistent with acute coronary syndrome.  Given exertional chest pain that is worsening in frequency and duration,  concerning for unstable angina, though with initial troponin mildly elevated may represent NSTEMI.  Will  trend troponins to peak.  Start heparin drip, aspirin, statin.  Check echocardiogram.  As needed sublingual nitroglycerin for further chest pain.  Plan heart catheterization tomorrow.  Risks and benefits of cardiac catheterization have been discussed with the patient.  These include bleeding, infection, kidney damage, stroke, heart attack, death.  The patient understands these risks and is willing to proceed.  Donato Heinz, MD

## 2022-03-19 NOTE — Telephone Encounter (Signed)
Spoke to patient by telephone and was advised that he is being taken to [facility] ER now.

## 2022-03-19 NOTE — Progress Notes (Signed)
ANTICOAGULATION CONSULT NOTE - Initial Consult  Pharmacy Consult for Heparin infusion Indication: chest pain/ACS  Allergies  Allergen Reactions   Esomeprazole Magnesium     REACTION: abdominal pain   Simvastatin     Wasn't working for pt    Alum Hydroxide-Mag Carbonate     Medication interaction only    Patient Measurements: Height: '6\' 2"'$  (188 cm) Weight: 106.6 kg (235 lb) IBW/kg (Calculated) : 82.2 Heparin Dosing Weight: 103.9 kg  Vital Signs: Temp: 98.1 F (36.7 C) (08/30 1548) Temp Source: Oral (08/30 1548) BP: 139/85 (08/30 1915) Pulse Rate: 75 (08/30 1915)  Labs: Recent Labs    03/19/22 1707  HGB 14.4  HCT 38.9*  PLT 172  CREATININE 1.00  TROPONINIHS 42*    Estimated Creatinine Clearance: 100.9 mL/min (by C-G formula based on SCr of 1 mg/dL).   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 Pt does not take any anticoagulants prior to admission. Pharmacy consulted to dose heparin infusion per ACS protocol.   Hgb 14.4, Plt 172 Troponin 42  Goal of Therapy:  Heparin level 0.3-0.7 units/ml Monitor platelets by anticoagulation protocol: Yes   Plan:  Heparin bolus 4000 units IV x1, followed by Heparin infusion at 1400 units/hr  Check heparin level in 6 hours Monitor CBC, heparin levels, and for s/sx of bleeding daily  Luisa Hart, PharmD, BCPS Clinical Pharmacist 03/19/2022 7:35 PM   Please refer to [facility] for pharmacy phone number

## 2022-03-19 NOTE — ED Provider Notes (Signed)
[facility] EMERGENCY DEPARTMENT Provider Note   CSN: [id number] Arrival date & time: 03/19/22  1537     History  Chief Complaint  Patient presents with   Chest Pain    Wayne Vang is a 61 y.o. male.  61 year old male with history of seizures, GERD brought to emergency room via EMS for chest pain. Patient reports substernal chest pain/pressure, on and off for 6 months, worse for past 4 days. Pain worsens with physical activity and relieves at rest. Denies attributing symptoms: Nausea, vomiting, sweating, palpitations or dizziness. Patient attributed his Sx to GERD however the chest pressure got worse today while he was walking on an incline at work. He called his PCP who advised to call 911.  Patient received 0.4 mg of nitroglycerin and 4 x 81 mg chewable aspirin while he was being transported to emergency room.      Chest Pain Associated symptoms: no abdominal pain, no cough, no dizziness, no fatigue, no nausea, no palpitations, no shortness of breath and no vomiting        Home Medications Prior to Admission medications   Medication Sig Start Date End Date Taking? Authorizing Provider  Aspirin-Acetaminophen-Caffeine (GOODY HEADACHE PO) Take 1 packet by mouth as needed (headache).   Yes [provider]  ibuprofen (ADVIL) 200 MG tablet Take 800 mg by mouth every 6 (six) hours as needed for mild pain.   Yes [provider]  lansoprazole (PREVACID) 30 MG capsule TAKE 1 CAPSULE BY MOUTH DAILY BEFORE BREAKFAST. Patient taking differently: Take 30 mg by mouth daily before breakfast. 10/18/21  Yes Copland, Frederico Hamman, MD  phenytoin (DILANTIN) 100 MG ER capsule TAKE 2 CAPSULES BY MOUTH EVERY MORNING AND TAKE 3 CAPSULES AT BEDTIME Patient taking differently: Take 200-300 mg by mouth See admin instructions. Take 2 capsules (200 mg) every morning and then take 3 capsules (300 mg) at bedtime 10/17/21  Yes Lomax, Amy, NP  potassium chloride (KLOR-CON)  10 MEQ tablet Take 10 mEq by mouth daily.   Yes [provider]  Vitamin D, Ergocalciferol, (DRISDOL) 1.25 MG (50000 UNIT) CAPS capsule Take 50,000 Units by mouth daily.   Yes [provider]      Allergies    Esomeprazole magnesium, Simvastatin, and Alum hydroxide-mag carbonate    Review of Systems   Review of Systems  Constitutional:  Negative for chills and fatigue.  Respiratory:  Positive for chest tightness. Negative for cough and shortness of breath.   Cardiovascular:  Positive for chest pain. Negative for palpitations and leg swelling.  Gastrointestinal:  Negative for abdominal pain, constipation, diarrhea, nausea and vomiting.  Skin:  Negative for color change, pallor and rash.  Neurological:  Negative for dizziness and light-headedness.  Psychiatric/Behavioral:  Negative for agitation and sleep disturbance.     Physical Exam Updated Vital Signs BP (!) 137/95 (BP Location: Right Arm)   Pulse 78   Temp 98.3 F (36.8 C) (Oral)   Resp 18   Ht '6\' 2"'$  (1.88 m)   Wt 100 kg   SpO2 100%   BMI 28.30 kg/m  Physical Exam Vitals and nursing note reviewed.  Constitutional:      General: He is not in acute distress.    Appearance: He is well-developed. He is not ill-appearing.  HENT:     Head: Normocephalic and atraumatic.  Cardiovascular:     Rate and Rhythm: Normal rate and regular rhythm.     Chest Wall: PMI is not displaced.  Heart sounds: No murmur heard. Pulmonary:     Effort: Pulmonary effort is normal. No tachypnea or respiratory distress.     Breath sounds: Normal breath sounds.  Abdominal:     Palpations: Abdomen is soft. There is no mass.     Tenderness: There is no abdominal tenderness. There is no guarding.  Skin:    General: Skin is warm.     Capillary Refill: Capillary refill takes less than 2 seconds.     Coloration: Skin is not pale.     Findings: No rash.  Neurological:     Mental Status: He is alert and oriented to person, place,  and time.  Psychiatric:        Mood and Affect: Mood normal.     ED Results / Procedures / Treatments   Labs (all labs ordered are listed, but only abnormal results are displayed) Labs Reviewed  CBC - Abnormal; Notable for the following components:      Result Value   HCT 38.9 (*)    MCHC 37.0 (*)    All other components within normal limits  TROPONIN I (HIGH SENSITIVITY) - Abnormal; Notable for the following components:   Troponin I (High Sensitivity) 42 (*)    All other components within normal limits  TROPONIN I (HIGH SENSITIVITY) - Abnormal; Notable for the following components:   Troponin I (High Sensitivity) 52 (*)    All other components within normal limits  COMPREHENSIVE METABOLIC PANEL  LIPASE, BLOOD  HIV ANTIBODY (ROUTINE TESTING W REFLEX)  LIPOPROTEIN A (LPA)  HEMOGLOBIN N3Z  BASIC METABOLIC PANEL  LIPID PANEL  CBC  TSH  CBG MONITORING, ED  TROPONIN I (HIGH SENSITIVITY)    EKG EKG Interpretation  Date/Time:  Wednesday March 19 2022 15:45:27 EDT Ventricular Rate:  84 PR Interval:  157 QRS Duration: 97 QT Interval:  362 QTC Calculation: 428 R Axis:   -13 Text Interpretation: Sinus rhythm Confirmed by Davonna Belling [phone]) on 03/19/2022 3:50:24 PM  Radiology DG Chest Portable 1 View  Result Date: 03/19/2022 CLINICAL DATA:  Chest tightness EXAM: PORTABLE CHEST 1 VIEW COMPARISON:  Chest 11/27/2009 FINDINGS: The heart size and mediastinal contours are within normal limits. Both lungs are clear. The visualized skeletal structures are unremarkable. IMPRESSION: No active disease. Electronically Signed   By: Franchot Gallo M.D.   On: 03/19/2022 17:09    Procedures Procedures    Medications Ordered in ED Medications  nitroGLYCERIN (NITROSTAT) SL tablet 0.4 mg (has no administration in time range)  phenytoin (DILANTIN) ER capsule 200 mg (has no administration in time range)  pantoprazole (PROTONIX) EC tablet 40 mg (has no administration in time range)   aspirin EC tablet 81 mg (has no administration in time range)  acetaminophen (TYLENOL) tablet 650 mg (has no administration in time range)  ondansetron (ZOFRAN) injection 4 mg (has no administration in time range)  rosuvastatin (CRESTOR) tablet 5 mg (has no administration in time range)  sodium chloride flush (NS) 0.9 % injection 3 mL (3 mLs Intravenous Given 03/19/22 2241)  sodium chloride flush (NS) 0.9 % injection 3 mL (has no administration in time range)  0.9 %  sodium chloride infusion (has no administration in time range)  aspirin chewable tablet 81 mg (has no administration in time range)  0.9% sodium chloride infusion (has no administration in time range)    Followed by  0.9% sodium chloride infusion (has no administration in time range)  heparin ADULT infusion 100 units/mL (25000 units/256m) (1,400 Units/hr Intravenous  New Bag/Given 03/19/22 2028)  phenytoin (DILANTIN) ER capsule 300 mg (300 mg Oral Given 03/19/22 2013)  aspirin tablet 325 mg (325 mg Oral Given 03/19/22 2013)  heparin injection 4,000 Units (4,000 Units Intravenous Given 03/19/22 2031)    ED Course/ Medical Decision Making/ A&P Clinical Course as of 03/19/22 2314  Wed Mar 19, 2022  1714 Lipase, blood [BM]  1715 Comprehensive metabolic panel [BM]    Clinical Course User Index [BM] Teola Bradley, MD                           Medical Decision Making 61 year old male with history of seizures, GERD brought to ED via EMS for chest pain. Reports substernal pressure, squeezing in nature, on and off for 6 months. Patient attributed to his symptoms with GERD however symptoms gradually got worse for past 4 days, predominantly with physical activity and relieved at rest. Patient received 0.4 mg of nitroglycerin and 4 x 81 mg chewable aspirin by the EMS.  Patient reports his symptoms improved within 10 minutes.  Patient well-appearing.  Vitals stable, afebrile, no acute visible distress. Pt comfortably communicating.   Wife present in the room with the patient.  Skin normal, no rashes.  S1-S2 normal without gallop or murmur.  PMI nondisplaced.  No JVD Lungs clear to auscultation. Abdomen soft, nontender. No pedal edema  EKG: Sinus rhythm, heart rate 84.  Normal axis.  No ST changes appreciated. CXR: No active cardio pulmonary disease  CBC: WNL Elevated Initial Trop: 42. Pt remains asymptomatic however with  Moderate HEART Score: 4 (12-65% 30-day MACE) with on and off chest pain worse with exertion concerning for unstable angina? I have discussed the results with the patient and plan for admission for further evaluation. Cardiology consult was placed. The patient appears reasonably stabilized for admission considering the current resources, flow, and capabilities available in the ED at this time.  Upon reevaluation patient complained the chest pressure/squeezing in nature which is gradually decreasing in intensity.  Patient denied palpitations, dizziness, nausea or sweating.  Patient did not appear in distress. Second dose of 0.4 mg nitroglycerin was ordered.  While I was reevaluating the patient cardiologist walked in the room to evaluate the patient.  As per cardiologist the plan is to start patient on heparin and admit to cardiology unit and possible to Cath Lab tomorrow.    Amount and/or Complexity of Data Reviewed Labs: ordered. Decision-making details documented in ED Course. Radiology: ordered.  Risk Decision regarding hospitalization.           Final Clinical Impression(s) / ED Diagnoses Final diagnoses:  Unstable angina pectoris [facility])    Rx / DC Orders ED Discharge Orders     None         Teola Bradley, MD 03/19/22 2315    Carmin Muskrat, MD 03/20/22 0006

## 2022-03-19 NOTE — Telephone Encounter (Signed)
PLEASE NOTE: All timestamps contained within this report are represented as [place] Standard Time. CONFIDENTIALTY NOTICE: This fax transmission is intended only for the addressee. It contains information that is legally privileged, confidential or otherwise protected from use or disclosure. If you are not the intended recipient, you are strictly prohibited from reviewing, disclosing, copying using or disseminating any of this information or taking any action in reliance on or regarding this information. If you have received this fax in error, please notify us immediately by telephone so that we can arrange for its return to [place]. Phone: [phone], Toll-Free: [phone], Fax: [phone] Page: 1 of 2 Call Id: [id number] [place] [facility] - Client TELEPHONE ADVICE RECORD [facility] Patient Name: Wayne Vang Gender: Male DOB: 1960-12-08 Age: 61 Y 29 D Return Phone Number: [phone] (Primary), [phone] (Secondary) Address: [place]  [address] Client [facility] - Client Client Site [place] - Day Provider Pennock Loffler - MD Contact Type Call Who Is Calling Patient / Member / Family / Caregiver Call Type Triage / Clinical Relationship To Patient Self Return Phone Number [phone] (Primary) Chief Complaint CHEST PAIN - pain, pressure, heaviness or tightness Reason for Call Symptomatic / Request for [name] states he is having chest pains when he is up. It doesn't hurt as much when he is sitting. He has an appointment tomorrow. Translation No Nurse Assessment Nurse: Hassell Done, RN, Melanie Date/Time (Eastern Time): 03/19/2022 2:33:21 PM Confirm and document reason for call. If symptomatic, describe symptoms. ---Caller has chest tightness in the middle or the Left of center of his chest. He has had this for over a week, has been getting  worse now. Has tried antacid and inhaler and it didnt help. Does the patient have any new or worsening symptoms? ---Yes Will a triage be completed? ---Yes Related visit to physician within the last 2 weeks? ---No Does the PT have any chronic conditions? (i.e. diabetes, asthma, this includes High risk factors for pregnancy, etc.) ---No Is this a behavioral health or substance abuse call? ---No Guidelines Guideline Title Affirmed Question Affirmed Notes Nurse Date/Time Eilene Ghazi Time) Chest Pain [1] Chest pain lasts > 5 minutes AND [2] age > 18 Arnaldo Natal 03/19/2022 2:35:04 PM Disp. Time Eilene Ghazi Time) Disposition Final User 03/19/2022 2:29:53 PM Send to Urgent Lina Sar 03/19/2022 2:38:47 PM Call EMS 911 Now Yes Hassell Done RN, Threasa Beards 03/19/2022 2:44:05 PM 911 Outcome Documentation Hassell Done, RN, Melanie PLEASE NOTE: All timestamps contained within this report are represented as [place] Standard Time. CONFIDENTIALTY NOTICE: This fax transmission is intended only for the addressee. It contains information that is legally privileged, confidential or otherwise protected from use or disclosure. If you are not the intended recipient, you are strictly prohibited from reviewing, disclosing, copying using or disseminating any of this information or taking any action in reliance on or regarding this information. If you have received this fax in error, please notify us immediately by telephone so that we can arrange for its return to [place]. Phone: [phone], Toll-Free: [phone], Fax: [phone] Page: 2 of 2 Call Id: [id number] [place]. Time Eilene Ghazi Time) Disposition Final User Reason: caller is going to call EMS now Final Disposition 03/19/2022 2:38:47 PM Call EMS 911 Now Yes Hassell Done, RN, Threasa Beards Caller Disagree/Comply Comply Caller Understands Yes PreDisposition [place] Advice Given Per Guideline CALL EMS 911 NOW: * Immediate medical attention is needed. You need to  hang up  and call 911 (or an ambulance). NOTE TO TRIAGER - IF CALLER ASKS ABOUT ASPIRIN: * Call EMS 911 first. * Available: As either 81 mg (taking two = 162 mg), 162 mg, or 325 mg tablets. * Contraindications: Allergy to aspirin. CARE ADVICE given per Chest Pain (Adult) guideline. Referrals GO TO FACILITY UNDECIDED

## 2022-03-19 NOTE — Telephone Encounter (Signed)
Patient wife Barnett Applebaum called in stating that Wayne Vang had been experiencing chest pain the last few days. Spoke with Chrissie Noa and he stated when he sits down he feel fine but when he is up and moving around he has pain. He is scheduled for tomorrow. Sent over to triage.

## 2022-03-19 NOTE — ED Triage Notes (Signed)
Patient has had chest pain for month and has not seen a doctor about it. Patient has no cardiac history but states that it is 10/10 with exertion. Patient was at work and couldn't stand the pain while working so EMS was called.  EMS gave 1 nitro prior to arrival and now patient has no complaints of pain. BP 143/90, HR 86 CBG 110.

## 2022-03-19 NOTE — ED Notes (Signed)
Patient ambulated to the bathroom with a steady gait

## 2022-03-20 ENCOUNTER — Encounter (HOSPITAL_COMMUNITY): Payer: Self-pay | Admitting: Interventional Cardiology

## 2022-03-20 ENCOUNTER — Observation Stay (HOSPITAL_COMMUNITY): Payer: BC Managed Care – PPO

## 2022-03-20 ENCOUNTER — Ambulatory Visit: Payer: BC Managed Care – PPO | Admitting: Family Medicine

## 2022-03-20 ENCOUNTER — Encounter (HOSPITAL_COMMUNITY)
Admission: EM | Disposition: A | Discharge status: Home / Self Care | Payer: Self-pay | Source: Home / Self Care | Attending: Cardiology

## 2022-03-20 DIAGNOSIS — E782 Mixed hyperlipidemia: Secondary | ICD-10-CM | POA: Diagnosis not present

## 2022-03-20 DIAGNOSIS — I2510 Atherosclerotic heart disease of native coronary artery without angina pectoris: Secondary | ICD-10-CM

## 2022-03-20 DIAGNOSIS — I5020 Unspecified systolic (congestive) heart failure: Secondary | ICD-10-CM

## 2022-03-20 DIAGNOSIS — I25110 Atherosclerotic heart disease of native coronary artery with unstable angina pectoris: Secondary | ICD-10-CM

## 2022-03-20 DIAGNOSIS — I214 Non-ST elevation (NSTEMI) myocardial infarction: Secondary | ICD-10-CM | POA: Diagnosis not present

## 2022-03-20 DIAGNOSIS — R739 Hyperglycemia, unspecified: Secondary | ICD-10-CM

## 2022-03-20 HISTORY — PX: LEFT HEART CATH AND CORONARY ANGIOGRAPHY: CATH118249

## 2022-03-20 LAB — HIV ANTIBODY (ROUTINE TESTING W REFLEX): HIV Screen 4th Generation wRfx: NONREACTIVE

## 2022-03-20 LAB — BASIC METABOLIC PANEL
Anion gap: 7 (ref 5–15)
BUN: 12 mg/dL (ref 8–23)
CO2: 29 mmol/L (ref 22–32)
Calcium: 9.1 mg/dL (ref 8.9–10.3)
Chloride: 104 mmol/L (ref 98–111)
Creatinine, Ser: 0.94 mg/dL (ref 0.61–1.24)
GFR, Estimated: >60 mL/min (ref >60)
Glucose, Bld: 151 mg/dL — ABNORMAL HIGH (ref 70–99)
Potassium: 4 mmol/L (ref 3.5–5.1)
Sodium: 140 mmol/L (ref 135–145)

## 2022-03-20 LAB — ECHOCARDIOGRAM COMPLETE
Area-P 1/2: 2.69 cm2
Height: 74 in
S' Lateral: 2.8 cm
Weight: 3526.4 oz

## 2022-03-20 LAB — CBC
HCT: 41.4 % (ref 39.0–52.0)
Hemoglobin: 15 g/dL (ref 13.0–17.0)
MCH: 32.4 pg (ref 26.0–34.0)
MCHC: 36.2 g/dL — ABNORMAL HIGH (ref 30.0–36.0)
MCV: 89.4 fL (ref 80.0–100.0)
Platelets: 174 10*3/uL (ref 150–400)
RBC: 4.63 MIL/uL (ref 4.22–5.81)
RDW: 12.2 % (ref 11.5–15.5)
WBC: 5.1 10*3/uL (ref 4.0–10.5)
nRBC: 0 % (ref 0.0–0.2)

## 2022-03-20 LAB — LIPID PANEL
Cholesterol: 219 mg/dL — ABNORMAL HIGH (ref 0–200)
HDL: 82 mg/dL (ref >40)
LDL Cholesterol: 129 mg/dL — ABNORMAL HIGH (ref 0–99)
Total CHOL/HDL Ratio: 2.7 RATIO
Triglycerides: 41 mg/dL (ref <150)
VLDL: 8 mg/dL (ref 0–40)

## 2022-03-20 LAB — HEMOGLOBIN A1C
Hgb A1c MFr Bld: 4.9 % (ref 4.8–5.6)
Mean Plasma Glucose: 93.93 mg/dL

## 2022-03-20 LAB — HEPARIN LEVEL (UNFRACTIONATED): Heparin Unfractionated: 0.73 IU/mL — ABNORMAL HIGH (ref 0.30–0.70)

## 2022-03-20 LAB — TSH: TSH: 1.321 u[IU]/mL (ref 0.350–4.500)

## 2022-03-20 LAB — TROPONIN I (HIGH SENSITIVITY)
Troponin I (High Sensitivity): 34 ng/L — ABNORMAL HIGH (ref <18)
Troponin I (High Sensitivity): 37 ng/L — ABNORMAL HIGH (ref <18)

## 2022-03-20 SURGERY — LEFT HEART CATH AND CORONARY ANGIOGRAPHY
Anesthesia: LOCAL

## 2022-03-20 MED ORDER — ONDANSETRON HCL 4 MG/2ML IJ SOLN: 4.0000 mg | Freq: Four times a day (QID) | INTRAMUSCULAR | Status: DC | PRN

## 2022-03-20 MED ORDER — HEPARIN SODIUM (PORCINE) 1000 UNIT/ML IJ SOLN
INTRAMUSCULAR | Status: AC
  Filled 2022-03-20: qty 10

## 2022-03-20 MED ORDER — ACETAMINOPHEN 325 MG PO TABS: 650.0000 mg | ORAL_TABLET | ORAL | Status: DC | PRN

## 2022-03-20 MED ORDER — HEPARIN (PORCINE) 25000 UT/250ML-% IV SOLN
1200.0000 [IU]/h | INTRAVENOUS | Status: DC
  Administered 2022-03-20 – 2022-03-26 (×7): 1200 [IU]/h via INTRAVENOUS
  Filled 2022-03-20 (×6): qty 250

## 2022-03-20 MED ORDER — MIDAZOLAM HCL 2 MG/2ML IJ SOLN
INTRAMUSCULAR | Status: AC
  Filled 2022-03-20: qty 2

## 2022-03-20 MED ORDER — IOHEXOL 350 MG/ML SOLN
INTRAVENOUS | Status: DC | PRN
  Administered 2022-03-20: 40 mL

## 2022-03-20 MED ORDER — VERAPAMIL HCL 2.5 MG/ML IV SOLN
INTRAVENOUS | Status: AC
  Filled 2022-03-20: qty 2

## 2022-03-20 MED ORDER — HYDRALAZINE HCL 20 MG/ML IJ SOLN: 10.0000 mg | INTRAMUSCULAR | Status: AC | PRN

## 2022-03-20 MED ORDER — FENTANYL CITRATE (PF) 100 MCG/2ML IJ SOLN
INTRAMUSCULAR | Status: AC
  Filled 2022-03-20: qty 2

## 2022-03-20 MED ORDER — SODIUM CHLORIDE 0.9 % IV SOLN: 250.0000 mL | INTRAVENOUS | Status: DC | PRN

## 2022-03-20 MED ORDER — SODIUM CHLORIDE 0.9% FLUSH
3.0000 mL | Freq: Two times a day (BID) | INTRAVENOUS | Status: DC
  Administered 2022-03-20 – 2022-03-23 (×4): 3 mL via INTRAVENOUS

## 2022-03-20 MED ORDER — HEPARIN SODIUM (PORCINE) 1000 UNIT/ML IJ SOLN
INTRAMUSCULAR | Status: DC | PRN
  Administered 2022-03-20: 5000 [IU] via INTRAVENOUS

## 2022-03-20 MED ORDER — LABETALOL HCL 5 MG/ML IV SOLN
10.0000 mg | INTRAVENOUS | Status: AC | PRN
Start: 2022-03-20 — End: 2022-03-20

## 2022-03-20 MED ORDER — HEPARIN (PORCINE) IN NACL 1000-0.9 UT/500ML-% IV SOLN
INTRAVENOUS | Status: AC
Start: 2022-03-20 — End: ?
  Filled 2022-03-20: qty 500

## 2022-03-20 MED ORDER — LIDOCAINE HCL (PF) 1 % IJ SOLN
INTRAMUSCULAR | Status: AC
  Filled 2022-03-20: qty 30

## 2022-03-20 MED ORDER — VERAPAMIL HCL 2.5 MG/ML IV SOLN
INTRAVENOUS | Status: DC | PRN
  Administered 2022-03-20 (×2): 10 mL via INTRA_ARTERIAL

## 2022-03-20 MED ORDER — MIDAZOLAM HCL 2 MG/2ML IJ SOLN
INTRAMUSCULAR | Status: DC | PRN
  Administered 2022-03-20: 1 mg via INTRAVENOUS

## 2022-03-20 MED ORDER — HEPARIN (PORCINE) IN NACL 1000-0.9 UT/500ML-% IV SOLN
INTRAVENOUS | Status: DC | PRN
  Administered 2022-03-20 (×2): 500 mL

## 2022-03-20 MED ORDER — SODIUM CHLORIDE 0.9% FLUSH: 3.0000 mL | INTRAVENOUS | Status: DC | PRN

## 2022-03-20 MED ORDER — FENTANYL CITRATE (PF) 100 MCG/2ML IJ SOLN
INTRAMUSCULAR | Status: DC | PRN
  Administered 2022-03-20: 25 ug via INTRAVENOUS

## 2022-03-20 MED ORDER — MIDAZOLAM HCL 2 MG/2ML IJ SOLN
INTRAMUSCULAR | Status: DC | PRN
  Administered 2022-03-20: 2 mg via INTRAVENOUS

## 2022-03-20 MED ORDER — SODIUM CHLORIDE 0.9 % IV SOLN: INTRAVENOUS | Status: AC

## 2022-03-20 MED ORDER — LIDOCAINE HCL (PF) 1 % IJ SOLN
INTRAMUSCULAR | Status: DC | PRN
  Administered 2022-03-20: 2 mL

## 2022-03-20 SURGICAL SUPPLY — 9 items
BAND ZEPHYR COMPRESS 30 LONG (HEMOSTASIS) IMPLANT
CATH 5FR JL3.5 JR4 ANG PIG MP (CATHETERS) IMPLANT
GLIDESHEATH SLEND SS 6F .021 (SHEATH) IMPLANT
GUIDEWIRE INQWIRE 1.5J.035X260 (WIRE) IMPLANT
INQWIRE 1.5J .035X260CM (WIRE) ×1
KIT HEART LEFT (KITS) ×1 IMPLANT
PACK CARDIAC CATHETERIZATION (CUSTOM PROCEDURE TRAY) ×1 IMPLANT
TRANSDUCER W/STOPCOCK (MISCELLANEOUS) ×1 IMPLANT
TUBING CIL FLEX 10 FLL-RA (TUBING) ×1 IMPLANT

## 2022-03-20 NOTE — Interval H&P Note (Signed)
Cath Lab Visit (complete for each Cath Lab visit)  Clinical Evaluation Leading to the Procedure:   ACS: Yes.    Non-ACS:    Anginal Classification: CCS IV  Anti-ischemic medical therapy: Minimal Therapy (1 class of medications)  Non-Invasive Test Results: No non-invasive testing performed  Prior CABG: No previous CABG      History and Physical Interval Note:  03/20/2022 1:58 PM  Wayne Vang  has presented today for surgery, with the diagnosis of unstable angina.  The various methods of treatment have been discussed with the patient and family. After consideration of risks, benefits and other options for treatment, the patient has consented to  Procedure(s): LEFT HEART CATH AND CORONARY ANGIOGRAPHY (N/A) as a surgical intervention.  The patient's history has been reviewed, patient examined, no change in status, stable for surgery.  I have reviewed the patient's chart and labs.  Questions were answered to the patient's satisfaction.     [name]

## 2022-03-20 NOTE — Care Management (Signed)
  Transition of Care [facility]) Screening Note   Patient Details  Name: Wayne Vang Date of Birth: [birth year]   Transition of Care [facility]) CM/SW Contact:    Bethena Roys, RN Phone Number: 03/20/2022, 2:02 PM    Transition of Care Department [facility]) has reviewed the patient and no TOC needs have been identified at this time. We will continue to monitor patient advancement through interdisciplinary progression rounds. If new patient transition needs arise, please place a TOC consult.

## 2022-03-20 NOTE — Consult Note (Addendum)
[address]       [place] [address]             [phone]        Wayne Vang [facility] Medical Record #[id number] Date of Birth: 1961-01-29  Referring: No ref. provider found Primary Care: Moat Loffler, MD Primary Cardiologist:None  Chief Complaint:    Chief Complaint  Patient presents with   Chest Pain    History of Present Illness:    We are asked to see this 61 year old male in cardiothoracic surgical consultation for consideration of coronary artery surgical revascularization.  The patient has no significant previous cardiac risk factors with the exception of hyperlipidemia.Marland Kitchen  He does have a significant past medical history for esophageal reflux and hiatal hernia as well as a seizure disorder(over 40 years but no siezures in 20 years on meds)).  Patient presented to the emergency department with chest pain.  He notes that he has had exertional chest discomfort for approximately 6 months which would typically resolve with rest.  The symptoms have increased in severity and duration over the past 1 to 2 months.  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  He describes the pain as a squeezing type pressure in the lower portion of the sternal region.  It is always relieved with rest.  There is no radiation associated with it.  He was found in the emergency department to have an elevated troponin and cardiology consultation was obtained.  He was felt to require admission for further evaluation and treatment.  High-sensitivity troponin levels have been mildly elevated and peaked at 52.  EKG showed no ST or T wave abnormalities.  He underwent cardiac catheterization on today's date and is found to have severe LAD and circumflex disease.  Please see the full report described below.  Left ventricular end-diastolic pressure is noted to be in the normal range and left ventricular ejection fraction is 55 to 65% by visual estimate.   This is confirmed on echocardiogram also done on today's date.  Additionally there were no regional wall motion abnormalities.  There is mild concentric left ventricular hypertrophy.  Findings are consistent with grade 1 diastolic dysfunction.  Right ventricular function is normal.  There are no significant valvular abnormalities.  He is a former smoker but quit approximately 20 years ago.  He has no history of diabetes. Hemoglobin A1c is 4.9.   Current Activity/ Functional Status: Patient is independent with mobility/ambulation, transfers, ADL's, IADL's.   Zubrod Score: At the time of surgery this patient's most appropriate activity status/level should be described as: '[]'$     0    Normal activity, no symptoms '[x]'$     1    Restricted in physical strenuous activity but ambulatory, able to do out light work '[]'$     2    Ambulatory and capable of self care, unable to do work activities, up and about                 more than 50%  Of the time                            '[]'$     3    Only limited self care, in bed greater than 50% of waking hours '[]'$     4    Completely disabled, no self care, confined to bed or chair '[]'$   5    Moribund  Past Medical History:  Diagnosis Date   Cancer ([place])    "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 Cornett, MD;  Location: [facility];  Service: General;  Laterality: Right;   INGUINAL HERNIA REPAIR Left 07/21/2013   INSERTION OF MESH N/A 12/06/2013   Procedure: INSERTION OF MESH;  Surgeon: Joyice Faster. Cornett, MD;  Location: [facility];  Service: General;  Laterality: N/A;   LEFT HEART CATH AND CORONARY ANGIOGRAPHY N/A 03/20/2022    Procedure: LEFT HEART CATH AND CORONARY ANGIOGRAPHY;  Surgeon: Jettie Booze, MD;  Location: [facility];  Service: Cardiovascular;  Laterality: N/A;   NO PAST SURGERIES     POLYPECTOMY      Social History   Tobacco Use  Smoking Status Former   Types: Cigarettes   Quit date: 07/21/2002   Years since quitting: 19.6  Smokeless Tobacco Never    Social History   Substance and Sexual Activity  Alcohol Use Yes   Comment: socially     Allergies  Allergen Reactions   Esomeprazole Magnesium Other (See Comments)    REACTION: abdominal pain   Simvastatin Other (See Comments)    Wasn't working for pt    Alum Hydroxide-Mag Carbonate Other (See Comments)    Medication interaction only    Current Facility-Administered Medications  Medication Dose Route Frequency Provider Last Rate Last Admin   0.9 %  sodium chloride infusion   Intravenous Continuous Jettie Booze, MD 100 mL/hr at 03/20/22 1510 New Bag at 03/20/22 1510   0.9 %  sodium chloride infusion  250 mL Intravenous PRN Jettie Booze, MD       acetaminophen (TYLENOL) tablet 650 mg  650 mg Oral Q4H PRN Jettie Booze, MD       aspirin EC tablet 81 mg  81 mg Oral Daily Jettie Booze, MD       hydrALAZINE (APRESOLINE) injection 10 mg  10 mg Intravenous Q20 Min PRN Jettie Booze, MD       labetalol (NORMODYNE) injection 10 mg  10 mg Intravenous Q10 min PRN Jettie Booze, MD       nitroGLYCERIN (NITROSTAT) SL tablet 0.4 mg  0.4 mg Sublingual Q5 min PRN Jettie Booze, MD       ondansetron [facility]) injection 4 mg  4 mg Intravenous Q6H PRN Jettie Booze, MD       pantoprazole (PROTONIX) EC tablet 40 mg  40 mg Oral Daily Jettie Booze, MD   40 mg at 03/20/22 0932   phenytoin (DILANTIN) ER capsule 200 mg  200 mg Oral q morning Jettie Booze, MD   200 mg at 03/20/22 6712   phenytoin (DILANTIN) ER capsule 300 mg  300 mg Oral q1800 Jettie Booze, MD   300 mg  at 03/19/22 2013   rosuvastatin (CRESTOR) tablet 5 mg  5 mg Oral Daily Jettie Booze, MD   5 mg at 03/20/22 0942   sodium chloride flush (NS) 0.9 % injection 3 mL  3  mL Intravenous Q12H Jettie Booze, MD   3 mL at 03/19/22 2241   sodium chloride flush (NS) 0.9 % injection 3 mL  3 mL Intravenous Q12H Jettie Booze, MD       sodium chloride flush (NS) 0.9 % injection 3 mL  3 mL Intravenous PRN Jettie Booze, MD        Medications Prior to Admission  Medication Sig Dispense Refill Last Dose   Aspirin-Acetaminophen-Caffeine (GOODY HEADACHE PO) Take 1 packet by mouth as needed (headache).   unk   ibuprofen (ADVIL) 200 MG tablet Take 800 mg by mouth every 6 (six) hours as needed for mild pain.   03/18/2022   lansoprazole (PREVACID) 30 MG capsule TAKE 1 CAPSULE BY MOUTH DAILY BEFORE BREAKFAST. (Patient taking differently: Take 30 mg by mouth daily before breakfast.) 90 capsule 1 03/19/2022   phenytoin (DILANTIN) 100 MG ER capsule TAKE 2 CAPSULES BY MOUTH EVERY MORNING AND TAKE 3 CAPSULES AT BEDTIME (Patient taking differently: Take 200-300 mg by mouth See admin instructions. Take 2 capsules (200 mg) every morning and then take 3 capsules (300 mg) at bedtime) 450 capsule 1 03/19/2022   potassium chloride (KLOR-CON) 10 MEQ tablet Take 10 mEq by mouth daily.   03/19/2022   Vitamin D, Ergocalciferol, (DRISDOL) 1.25 MG (50000 UNIT) CAPS capsule Take 50,000 Units by mouth daily.   03/19/2022    Family History  Problem Relation Age of Onset   Alzheimer's disease Mother    Cancer Father    Colon polyps Sister    Stroke Sister    Alcohol abuse Brother    Colon cancer Neg Hx    Esophageal cancer Neg Hx    Stomach cancer Neg Hx    Rectal cancer Neg Hx      Review of Systems:   Review of Systems  Constitutional:  Positive for malaise/fatigue and weight loss. Negative for chills, diaphoresis and fever.  HENT:  Positive for congestion. Negative for ear discharge, ear pain,  hearing loss, nosebleeds, sinus pain, sore throat and tinnitus.   Eyes: Negative.   Respiratory:  Positive for sputum production and shortness of breath. Negative for cough, hemoptysis, wheezing and stridor.   Cardiovascular:  Positive for chest pain. Negative for palpitations, orthopnea, claudication, leg swelling and PND.  Gastrointestinal:  Positive for constipation and heartburn. Negative for abdominal pain, blood in stool, diarrhea, melena, nausea and vomiting.       Hemmorhoids in distant past   Genitourinary:  Negative for dysuria, flank pain, frequency, hematuria and urgency.       + nocturia  Musculoskeletal:  Positive for back pain, joint pain and myalgias. Negative for falls and neck pain.  Skin: Negative.   Neurological:  Positive for dizziness, seizures and headaches. Negative for tingling, tremors, sensory change, speech change, focal weakness and weakness.  Psychiatric/Behavioral:  Negative for depression, hallucinations, memory loss, substance abuse and suicidal ideas. The patient is not nervous/anxious and does not have insomnia.      Physical Exam: BP 102/75 (BP Location: Right Arm)   Pulse 80   Temp 98 F (36.7 C) (Oral)   Resp 18   Ht '6\' 2"'$  (1.88 m)   Wt 100 kg   SpO2 96%   BMI 28.30 kg/m    General appearance: alert, cooperative, and no distress Head: Normocephalic, without obvious abnormality, atraumatic Neck: no adenopathy, no carotid bruit, no JVD, supple, symmetrical, trachea midline, and thyroid not enlarged, symmetric, no tenderness/mass/nodules Lymph nodes: Cervical, supraclavicular, and  axillary nodes normal. Resp: clear to auscultation bilaterally Back: symmetric, no curvature. ROM normal. No CVA tenderness. Cardio: regular rate and rhythm, S1, S2 normal, no murmur, click, rub or gallop GI: soft, non-tender; bowel sounds normal; no masses,  no organomegaly Extremities: extremities normal, atraumatic, no cyanosis or edema Neurologic: Grossly  normal Palpable pedal pulses, palpable radial pulses Some varicosities and spider veins but do not appear to be related to the greater saphenous vein  Diagnostic Studies & Laboratory data:     Recent Radiology Findings:   CARDIAC CATHETERIZATION  Result Date: 03/20/2022   Prox LAD lesion is 90% stenosed.   1st Mrg lesion is 25% stenosed.   Ost Cx to Prox Cx lesion is 75% stenosed.   The left ventricular systolic function is normal.   LV end diastolic pressure is normal.   The left ventricular ejection fraction is 55-65% by visual estimate.   There is no aortic valve stenosis. Left main equivalent disease with proximal LAD and ostial circumflex disease. Will obtain cardiac surgery consult.  Restart IV heparin 2 hours after TR band removal.   ECHOCARDIOGRAM COMPLETE  Result Date: 03/20/2022    ECHOCARDIOGRAM REPORT   Patient Name:   Wayne Vang Date of Exam: 03/20/2022 Medical Rec #:  [id number]       Height:       74.0 in Accession #:    [id number]      Weight:       220.4 lb Date of Birth:  09/16/1960        BSA:          2.265 m Patient Age:    61 years        BP:           113/84 mmHg Patient Gender: M               HR:           65 bpm. Exam Location:  Inpatient Procedure: 2D Echo, Cardiac Doppler and Color Doppler Indications:    NSTEMI I21.4  History:        Patient has no prior history of Echocardiogram examinations.                 Risk Factors:Dyslipidemia. NSTEMI.  Sonographer:    Ronny Flurry Referring Phys: [id number] Crista Luria BHAGAT IMPRESSIONS  1. Left ventricular ejection fraction, by estimation, is 60 to 65%. The left ventricle has normal function. The left ventricle has no regional wall motion abnormalities. There is mild concentric left ventricular hypertrophy. Left ventricular diastolic parameters are consistent with Grade I diastolic dysfunction (impaired relaxation).  2. Right ventricular systolic function is normal. The right ventricular size is normal. There is normal  pulmonary artery systolic pressure.  3. The mitral valve is normal in structure. Trivial mitral valve regurgitation. No evidence of mitral stenosis.  4. The aortic valve is tricuspid. Aortic valve regurgitation is not visualized. No aortic stenosis is present.  5. The inferior vena cava is normal in size with greater than 50% respiratory variability, suggesting right atrial pressure of 3 mmHg. FINDINGS  Left Ventricle: Left ventricular ejection fraction, by estimation, is 60 to 65%. The left ventricle has normal function. The left ventricle has no regional wall motion abnormalities. The left ventricular internal cavity size was normal in size. There is  mild concentric left ventricular hypertrophy. Left ventricular diastolic parameters are consistent with Grade I diastolic dysfunction (impaired relaxation). Right Ventricle: The right ventricular size is normal. No increase  in right ventricular wall thickness. Right ventricular systolic function is normal. There is normal pulmonary artery systolic pressure. The tricuspid regurgitant velocity is 0.94 m/s, and  with an assumed right atrial pressure of 3 mmHg, the estimated right ventricular systolic pressure is 6.5 mmHg. Left Atrium: Left atrial size was normal in size. Right Atrium: Right atrial size was normal in size. Pericardium: There is no evidence of pericardial effusion. Mitral Valve: The mitral valve is normal in structure. Trivial mitral valve regurgitation. No evidence of mitral valve stenosis. Tricuspid Valve: The tricuspid valve is normal in structure. Tricuspid valve regurgitation is trivial. No evidence of tricuspid stenosis. Aortic Valve: The aortic valve is tricuspid. Aortic valve regurgitation is not visualized. No aortic stenosis is present. Pulmonic Valve: The pulmonic valve was normal in structure. Pulmonic valve regurgitation is not visualized. No evidence of pulmonic stenosis. Aorta: The aortic root is normal in size and structure. Venous: The  inferior vena cava is normal in size with greater than 50% respiratory variability, suggesting right atrial pressure of 3 mmHg. IAS/Shunts: No atrial level shunt detected by color flow Doppler.  LEFT VENTRICLE PLAX 2D LVIDd:         4.30 cm   Diastology LVIDs:         2.80 cm   LV e' medial:    5.98 cm/s LV PW:         1.10 cm   LV E/e' medial:  10.3 LV IVS:        1.20 cm   LV e' lateral:   8.59 cm/s LVOT diam:     2.10 cm   LV E/e' lateral: 7.2 LV SV:         67 LV SV Index:   29 LVOT Area:     3.46 cm  RIGHT VENTRICLE RV S prime:     10.20 cm/s TAPSE (M-mode): 2.0 cm LEFT ATRIUM           Index        RIGHT ATRIUM           Index LA diam:      3.10 cm 1.37 cm/m   RA Area:     17.50 cm LA Vol (A2C): 37.5 ml 16.56 ml/m  RA Volume:   52.40 ml  23.13 ml/m  AORTIC VALVE LVOT Vmax:   89.40 cm/s LVOT Vmean:  58.300 cm/s LVOT VTI:    0.192 m  AORTA Ao Root diam: 3.40 cm Ao Asc diam:  3.30 cm MITRAL VALVE               TRICUSPID VALVE MV Area (PHT): 2.69 cm    TR Peak grad:   3.5 mmHg MV Decel Time: 282 msec    TR Vmax:        94.00 cm/s MV E velocity: 61.77 cm/s MV A velocity: 71.40 cm/s  SHUNTS MV E/A ratio:  0.87        Systemic VTI:  0.19 m                            Systemic Diam: 2.10 cm Skeet Latch MD Electronically signed by Skeet Latch MD Signature Date/Time: 03/20/2022/2:42:33 PM    Final    DG Chest Portable 1 View  Result Date: 03/19/2022 CLINICAL DATA:  Chest tightness EXAM: PORTABLE CHEST 1 VIEW COMPARISON:  Chest 11/27/2009 FINDINGS: The heart size and mediastinal contours are within normal limits. Both lungs are clear. The  visualized skeletal structures are unremarkable. IMPRESSION: No active disease. Electronically Signed   By: Franchot Gallo M.D.   On: 03/19/2022 17:09     I have independently reviewed the above radiologic studies and discussed with the patient   Recent Lab Findings: Lab Results  Component Value Date   WBC 5.1 03/20/2022   HGB 15.0 03/20/2022   HCT 41.4  03/20/2022   PLT 174 03/20/2022   GLUCOSE 151 (H) 03/20/2022   CHOL 219 (H) 03/20/2022   TRIG 41 03/20/2022   HDL 82 03/20/2022   LDLDIRECT 194.1 04/27/2009   LDLCALC 129 (H) 03/20/2022   ALT 20 03/19/2022   AST 27 03/19/2022   NA 140 03/20/2022   K 4.0 03/20/2022   CL 104 03/20/2022   CREATININE 0.94 03/20/2022   BUN 12 03/20/2022   CO2 29 03/20/2022   TSH 1.321 03/19/2022   HGBA1C 4.9 03/20/2022      Assessment / Plan: Severe two-vessel coronary disease in the setting of accelerating angina. Esophageal reflux/hiatal hernia Low back pain, status post previous injections Hyperlipidemia Seizure disorder-well-controlled on current meds   The surgeon will evaluate the patient and all relevant studies and determine timing for proceeding with CABG.  He appears to be an excellent candidate.  I  spent 40 minutes counseling the patient face to face.   John Giovanni, PA-C  03/20/2022 3:57 PM  I have been asked to consult on this patient and the above note, chart and studies personally reviewed by me and feel that the patient is an ideal candidate for Cabg and will schedule for this upcomming Wednesday In review the patient has been having central chest pressure/squeezing pain with exertion and was relieved with rest and drinking cold fluids. On day of ER visit had an exceptionally severe episode and requested wife call for PCP visit upon which was told to report to ER. Pt with no EKG findings of ischemia however had positive troponins, ECHO with normal LV function and went on to have catheterization (which I have reviewed personally) and was found to have severe 2 VCAD involving proximal LAD an Circumflex system. Pt currently pain free on heparin and has been active walking the floors.  On exam no concerning ausculatory findings and has external superficial vericosities on Left lower leg  Assessment NSTEMI: Pt with class II NYHA anginal symptoms and has a class I indication for CABG.  Pt's targets include LAD and OM (Ramus will be evaluated at time of surgery for additional grafting). All of the risks and goals of the operation including infection, bleeding, arrhythmias, stroke, heart attack and death have been discussed with pt and wife in room at the time of the consult and they agree with plan and wish to proceed.  Will obtain preop CT scan of chest and routine labs and studies per protocol and proceed on Wednesday Over 40 min was spent reviewing chart, discussing findings and plan with pt and wife and scheduling procedure and of that 20 min was face to face with patient

## 2022-03-20 NOTE — Progress Notes (Signed)
Patient returned from cath lab at 1500hrs.  Right radial band intact, level zero.  Patient given post cath instructions, verbalized understanding.

## 2022-03-20 NOTE — H&P (View-Only) (Signed)
[address]       [place] [address]             [phone]        Dee P Thrush  Medical Record #[id number] Date of Birth: 04-07-1961  Referring: No ref. provider found Primary Care: Brzoska Loffler, MD Primary Cardiologist:None  Chief Complaint:    Chief Complaint  Patient presents with   Chest Pain    History of Present Illness:    We are asked to see this 61 year old male in cardiothoracic surgical consultation for consideration of coronary artery surgical revascularization.  The patient has no significant previous cardiac risk factors with the exception of hyperlipidemia.Marland Kitchen  He does have a significant past medical history for esophageal reflux and hiatal hernia as well as a seizure disorder(over 40 years but no siezures in 20 years on meds)).  Patient presented to the emergency department with chest pain.  He notes that he has had exertional chest discomfort for approximately 6 months which would typically resolve with rest.  The symptoms have increased in severity and duration over the past 1 to 2 months.  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  He describes the pain as a squeezing type pressure in the lower portion of the sternal region.  It is always relieved with rest.  There is no radiation associated with it.  He was found in the emergency department to have an elevated troponin and cardiology consultation was obtained.  He was felt to require admission for further evaluation and treatment.  High-sensitivity troponin levels have been mildly elevated and peaked at 52.  EKG showed no ST or T wave abnormalities.  He underwent cardiac catheterization on today's date and is found to have severe LAD and circumflex disease.  Please see the full report described below.  Left ventricular end-diastolic pressure is noted to be in the normal range and left ventricular ejection fraction is 55 to 65% by visual estimate.   This is confirmed on echocardiogram also done on today's date.  Additionally there were no regional wall motion abnormalities.  There is mild concentric left ventricular hypertrophy.  Findings are consistent with grade 1 diastolic dysfunction.  Right ventricular function is normal.  There are no significant valvular abnormalities.  He is a former smoker but quit approximately 20 years ago.  He has no history of diabetes. Hemoglobin A1c is 4.9.   Current Activity/ Functional Status: Patient is independent with mobility/ambulation, transfers, ADL's, IADL's.   Zubrod Score: At the time of surgery this patient's most appropriate activity status/level should be described as: '[]'$     0    Normal activity, no symptoms '[x]'$     1    Restricted in physical strenuous activity but ambulatory, able to do out light work '[]'$     2    Ambulatory and capable of self care, unable to do work activities, up and about                 more than 50%  Of the time                            '[]'$     3    Only limited self care, in bed greater than 50% of waking hours '[]'$     4    Completely disabled, no self care, confined to bed or chair '[]'$   5    Moribund  Past Medical History:  Diagnosis Date   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 Cornett, MD;  Location: [facility];  Service: General;  Laterality: Right;   INGUINAL HERNIA REPAIR Left 07/21/2013   INSERTION OF MESH N/A 12/06/2013   Procedure: INSERTION OF MESH;  Surgeon: Joyice Faster. Cornett, MD;  Location: [facility];  Service: General;  Laterality: N/A;   LEFT HEART CATH AND CORONARY ANGIOGRAPHY N/A 03/20/2022    Procedure: LEFT HEART CATH AND CORONARY ANGIOGRAPHY;  Surgeon: Jettie Booze, MD;  Location: [facility];  Service: Cardiovascular;  Laterality: N/A;   NO PAST SURGERIES     POLYPECTOMY      Social History   Tobacco Use  Smoking Status Former   Types: Cigarettes   Quit date: 07/21/2002   Years since quitting: 19.6  Smokeless Tobacco Never    Social History   Substance and Sexual Activity  Alcohol Use Yes   Comment: socially     Allergies  Allergen Reactions   Esomeprazole Magnesium Other (See Comments)    REACTION: abdominal pain   Simvastatin Other (See Comments)    Wasn't working for pt    Alum Hydroxide-Mag Carbonate Other (See Comments)    Medication interaction only    Current Facility-Administered Medications  Medication Dose Route Frequency Provider Last Rate Last Admin   0.9 %  sodium chloride infusion   Intravenous Continuous Jettie Booze, MD 100 mL/hr at 03/20/22 1510 New Bag at 03/20/22 1510   0.9 %  sodium chloride infusion  250 mL Intravenous PRN Jettie Booze, MD       acetaminophen (TYLENOL) tablet 650 mg  650 mg Oral Q4H PRN Jettie Booze, MD       aspirin EC tablet 81 mg  81 mg Oral Daily Jettie Booze, MD       hydrALAZINE (APRESOLINE) injection 10 mg  10 mg Intravenous Q20 Min PRN Jettie Booze, MD       labetalol (NORMODYNE) injection 10 mg  10 mg Intravenous Q10 min PRN Jettie Booze, MD       nitroGLYCERIN (NITROSTAT) SL tablet 0.4 mg  0.4 mg Sublingual Q5 min PRN Jettie Booze, MD       ondansetron [facility]) injection 4 mg  4 mg Intravenous Q6H PRN Jettie Booze, MD       pantoprazole (PROTONIX) EC tablet 40 mg  40 mg Oral Daily Jettie Booze, MD   40 mg at 03/20/22 9629   phenytoin (DILANTIN) ER capsule 200 mg  200 mg Oral q morning Jettie Booze, MD   200 mg at 03/20/22 5284   phenytoin (DILANTIN) ER capsule 300 mg  300 mg Oral q1800 Jettie Booze, MD   300 mg  at 03/19/22 2013   rosuvastatin (CRESTOR) tablet 5 mg  5 mg Oral Daily Jettie Booze, MD   5 mg at 03/20/22 0942   sodium chloride flush (NS) 0.9 % injection 3 mL  3  mL Intravenous Q12H Jettie Booze, MD   3 mL at 03/19/22 2241   sodium chloride flush (NS) 0.9 % injection 3 mL  3 mL Intravenous Q12H Jettie Booze, MD       sodium chloride flush (NS) 0.9 % injection 3 mL  3 mL Intravenous PRN Jettie Booze, MD        Medications Prior to Admission  Medication Sig Dispense Refill Last Dose   Aspirin-Acetaminophen-Caffeine (GOODY HEADACHE PO) Take 1 packet by mouth as needed (headache).   unk   ibuprofen (ADVIL) 200 MG tablet Take 800 mg by mouth every 6 (six) hours as needed for mild pain.   03/18/2022   lansoprazole (PREVACID) 30 MG capsule TAKE 1 CAPSULE BY MOUTH DAILY BEFORE BREAKFAST. (Patient taking differently: Take 30 mg by mouth daily before breakfast.) 90 capsule 1 03/19/2022   phenytoin (DILANTIN) 100 MG ER capsule TAKE 2 CAPSULES BY MOUTH EVERY MORNING AND TAKE 3 CAPSULES AT BEDTIME (Patient taking differently: Take 200-300 mg by mouth See admin instructions. Take 2 capsules (200 mg) every morning and then take 3 capsules (300 mg) at bedtime) 450 capsule 1 03/19/2022   potassium chloride (KLOR-CON) 10 MEQ tablet Take 10 mEq by mouth daily.   03/19/2022   Vitamin D, Ergocalciferol, (DRISDOL) 1.25 MG (50000 UNIT) CAPS capsule Take 50,000 Units by mouth daily.   03/19/2022    Family History  Problem Relation Age of Onset   Alzheimer's disease Mother    Cancer Father    Colon polyps Sister    Stroke Sister    Alcohol abuse Brother    Colon cancer Neg Hx    Esophageal cancer Neg Hx    Stomach cancer Neg Hx    Rectal cancer Neg Hx      Review of Systems:   Review of Systems  Constitutional:  Positive for malaise/fatigue and weight loss. Negative for chills, diaphoresis and fever.  HENT:  Positive for congestion. Negative for ear discharge, ear pain,  hearing loss, nosebleeds, sinus pain, sore throat and tinnitus.   Eyes: Negative.   Respiratory:  Positive for sputum production and shortness of breath. Negative for cough, hemoptysis, wheezing and stridor.   Cardiovascular:  Positive for chest pain. Negative for palpitations, orthopnea, claudication, leg swelling and PND.  Gastrointestinal:  Positive for constipation and heartburn. Negative for abdominal pain, blood in stool, diarrhea, melena, nausea and vomiting.       Hemmorhoids in distant past   Genitourinary:  Negative for dysuria, flank pain, frequency, hematuria and urgency.       + nocturia  Musculoskeletal:  Positive for back pain, joint pain and myalgias. Negative for falls and neck pain.  Skin: Negative.   Neurological:  Positive for dizziness, seizures and headaches. Negative for tingling, tremors, sensory change, speech change, focal weakness and weakness.  Psychiatric/Behavioral:  Negative for depression, hallucinations, memory loss, substance abuse and suicidal ideas. The patient is not nervous/anxious and does not have insomnia.      Physical Exam: BP 102/75 (BP Location: Right Arm)   Pulse 80   Temp 98 F (36.7 C) (Oral)   Resp 18   Ht '6\' 2"'$  (1.88 m)   Wt 100 kg   SpO2 96%   BMI 28.30 kg/m    General appearance: alert, cooperative, and no distress Head: Normocephalic, without obvious abnormality, atraumatic Neck: no adenopathy, no carotid bruit, no JVD, supple, symmetrical, trachea midline, and thyroid not enlarged, symmetric, no tenderness/mass/nodules Lymph nodes: Cervical, supraclavicular, and  axillary nodes normal. Resp: clear to auscultation bilaterally Back: symmetric, no curvature. ROM normal. No CVA tenderness. Cardio: regular rate and rhythm, S1, S2 normal, no murmur, click, rub or gallop GI: soft, non-tender; bowel sounds normal; no masses,  no organomegaly Extremities: extremities normal, atraumatic, no cyanosis or edema Neurologic: Grossly  normal Palpable pedal pulses, palpable radial pulses Some varicosities and spider veins but do not appear to be related to the greater saphenous vein  Diagnostic Studies & Laboratory data:     Recent Radiology Findings:   CARDIAC CATHETERIZATION  Result Date: 03/20/2022   Prox LAD lesion is 90% stenosed.   1st Mrg lesion is 25% stenosed.   Ost Cx to Prox Cx lesion is 75% stenosed.   The left ventricular systolic function is normal.   LV end diastolic pressure is normal.   The left ventricular ejection fraction is 55-65% by visual estimate.   There is no aortic valve stenosis. Left main equivalent disease with proximal LAD and ostial circumflex disease. Will obtain cardiac surgery consult.  Restart IV heparin 2 hours after TR band removal.   ECHOCARDIOGRAM COMPLETE  Result Date: 03/20/2022    ECHOCARDIOGRAM REPORT   Patient Name:   Wayne Vang Date of Exam: 03/20/2022 Medical Rec #:  [id number]       Height:       74.0 in Accession #:    [id number]      Weight:       220.4 lb Date of Birth:  05-16-1961        BSA:          2.265 m Patient Age:    12 years        BP:           113/84 mmHg Patient Gender: M               HR:           65 bpm. Exam Location:  Inpatient Procedure: 2D Echo, Cardiac Doppler and Color Doppler Indications:    NSTEMI I21.4  History:        Patient has no prior history of Echocardiogram examinations.                 Risk Factors:Dyslipidemia. NSTEMI.  Sonographer:    Ronny Flurry Referring Phys: [id number] Crista Luria BHAGAT IMPRESSIONS  1. Left ventricular ejection fraction, by estimation, is 60 to 65%. The left ventricle has normal function. The left ventricle has no regional wall motion abnormalities. There is mild concentric left ventricular hypertrophy. Left ventricular diastolic parameters are consistent with Grade I diastolic dysfunction (impaired relaxation).  2. Right ventricular systolic function is normal. The right ventricular size is normal. There is normal  pulmonary artery systolic pressure.  3. The mitral valve is normal in structure. Trivial mitral valve regurgitation. No evidence of mitral stenosis.  4. The aortic valve is tricuspid. Aortic valve regurgitation is not visualized. No aortic stenosis is present.  5. The inferior vena cava is normal in size with greater than 50% respiratory variability, suggesting right atrial pressure of 3 mmHg. FINDINGS  Left Ventricle: Left ventricular ejection fraction, by estimation, is 60 to 65%. The left ventricle has normal function. The left ventricle has no regional wall motion abnormalities. The left ventricular internal cavity size was normal in size. There is  mild concentric left ventricular hypertrophy. Left ventricular diastolic parameters are consistent with Grade I diastolic dysfunction (impaired relaxation). Right Ventricle: The right ventricular size is normal. No increase  in right ventricular wall thickness. Right ventricular systolic function is normal. There is normal pulmonary artery systolic pressure. The tricuspid regurgitant velocity is 0.94 m/s, and  with an assumed right atrial pressure of 3 mmHg, the estimated right ventricular systolic pressure is 6.5 mmHg. Left Atrium: Left atrial size was normal in size. Right Atrium: Right atrial size was normal in size. Pericardium: There is no evidence of pericardial effusion. Mitral Valve: The mitral valve is normal in structure. Trivial mitral valve regurgitation. No evidence of mitral valve stenosis. Tricuspid Valve: The tricuspid valve is normal in structure. Tricuspid valve regurgitation is trivial. No evidence of tricuspid stenosis. Aortic Valve: The aortic valve is tricuspid. Aortic valve regurgitation is not visualized. No aortic stenosis is present. Pulmonic Valve: The pulmonic valve was normal in structure. Pulmonic valve regurgitation is not visualized. No evidence of pulmonic stenosis. Aorta: The aortic root is normal in size and structure. Venous: The  inferior vena cava is normal in size with greater than 50% respiratory variability, suggesting right atrial pressure of 3 mmHg. IAS/Shunts: No atrial level shunt detected by color flow Doppler.  LEFT VENTRICLE PLAX 2D LVIDd:         4.30 cm   Diastology LVIDs:         2.80 cm   LV e' medial:    5.98 cm/s LV PW:         1.10 cm   LV E/e' medial:  10.3 LV IVS:        1.20 cm   LV e' lateral:   8.59 cm/s LVOT diam:     2.10 cm   LV E/e' lateral: 7.2 LV SV:         67 LV SV Index:   29 LVOT Area:     3.46 cm  RIGHT VENTRICLE RV S prime:     10.20 cm/s TAPSE (M-mode): 2.0 cm LEFT ATRIUM           Index        RIGHT ATRIUM           Index LA diam:      3.10 cm 1.37 cm/m   RA Area:     17.50 cm LA Vol (A2C): 37.5 ml 16.56 ml/m  RA Volume:   52.40 ml  23.13 ml/m  AORTIC VALVE LVOT Vmax:   89.40 cm/s LVOT Vmean:  58.300 cm/s LVOT VTI:    0.192 m  AORTA Ao Root diam: 3.40 cm Ao Asc diam:  3.30 cm MITRAL VALVE               TRICUSPID VALVE MV Area (PHT): 2.69 cm    TR Peak grad:   3.5 mmHg MV Decel Time: 282 msec    TR Vmax:        94.00 cm/s MV E velocity: 61.77 cm/s MV A velocity: 71.40 cm/s  SHUNTS MV E/A ratio:  0.87        Systemic VTI:  0.19 m                            Systemic Diam: 2.10 cm Skeet Latch MD Electronically signed by Skeet Latch MD Signature Date/Time: 03/20/2022/2:42:33 PM    Final    DG Chest Portable 1 View  Result Date: 03/19/2022 CLINICAL DATA:  Chest tightness EXAM: PORTABLE CHEST 1 VIEW COMPARISON:  Chest 11/27/2009 FINDINGS: The heart size and mediastinal contours are within normal limits. Both lungs are clear. The  visualized skeletal structures are unremarkable. IMPRESSION: No active disease. Electronically Signed   By: Franchot Gallo M.D.   On: 03/19/2022 17:09     I have independently reviewed the above radiologic studies and discussed with the patient   Recent Lab Findings: Lab Results  Component Value Date   WBC 5.1 03/20/2022   HGB 15.0 03/20/2022   HCT 41.4  03/20/2022   PLT 174 03/20/2022   GLUCOSE 151 (H) 03/20/2022   CHOL 219 (H) 03/20/2022   TRIG 41 03/20/2022   HDL 82 03/20/2022   LDLDIRECT 194.1 04/27/2009   LDLCALC 129 (H) 03/20/2022   ALT 20 03/19/2022   AST 27 03/19/2022   NA 140 03/20/2022   K 4.0 03/20/2022   CL 104 03/20/2022   CREATININE 0.94 03/20/2022   BUN 12 03/20/2022   CO2 29 03/20/2022   TSH 1.321 03/19/2022   HGBA1C 4.9 03/20/2022      Assessment / Plan: Severe two-vessel coronary disease in the setting of accelerating angina. Esophageal reflux/hiatal hernia Low back pain, status post previous injections Hyperlipidemia Seizure disorder-well-controlled on current meds   The surgeon will evaluate the patient and all relevant studies and determine timing for proceeding with CABG.  He appears to be an excellent candidate.  I  spent 40 minutes counseling the patient face to face.   John Giovanni, PA-C  03/20/2022 3:57 PM  I have been asked to consult on this patient and the above note, chart and studies personally reviewed by me and feel that the patient is an ideal candidate for Cabg and will schedule for this upcomming Wednesday In review the patient has been having central chest pressure/squeezing pain with exertion and was relieved with rest and drinking cold fluids. On day of ER visit had an exceptionally severe episode and requested wife call for PCP visit upon which was told to report to ER. Pt with no EKG findings of ischemia however had positive troponins, ECHO with normal LV function and went on to have catheterization (which I have reviewed personally) and was found to have severe 2 VCAD involving proximal LAD an Circumflex system. Pt currently pain free on heparin and has been active walking the floors.  On exam no concerning ausculatory findings and has external superficial vericosities on Left lower leg  Assessment NSTEMI: Pt with class II NYHA anginal symptoms and has a class I indication for CABG.  Pt's targets include LAD and OM (Ramus will be evaluated at time of surgery for additional grafting). All of the risks and goals of the operation including infection, bleeding, arrhythmias, stroke, heart attack and death have been discussed with pt and wife in room at the time of the consult and they agree with plan and wish to proceed.  Will obtain preop CT scan of chest and routine labs and studies per protocol and proceed on Wednesday Over 40 min was spent reviewing chart, discussing findings and plan with pt and wife and scheduling procedure and of that 20 min was face to face with patient

## 2022-03-20 NOTE — Progress Notes (Signed)
ANTICOAGULATION CONSULT NOTE - Follow Up  Pharmacy Consult for Heparin infusion Indication: chest pain/ACS  Allergies  Allergen Reactions   Esomeprazole Magnesium Other (See Comments)    REACTION: abdominal pain   Simvastatin Other (See Comments)    Wasn't working for pt    Alum Hydroxide-Mag Carbonate Other (See Comments)    Medication interaction only    Patient Measurements: Height: '6\' 2"'$  (188 cm) Weight: 100 kg (220 lb 6.4 oz) IBW/kg (Calculated) : 82.2  Vital Signs: Temp: 98 F (36.7 C) (08/31 0806) Temp Source: Oral (08/31 0806) BP: 113/84 (08/31 0806) Pulse Rate: 72 (08/31 0806)  Labs: Recent Labs    03/19/22 1707 03/19/22 1855 03/19/22 2250 03/20/22 0010 03/20/22 0618  HGB 14.4  --   --  15.0  --   HCT 38.9*  --   --  41.4  --   PLT 172  --   --  174  --   HEPARINUNFRC  --   --   --   --  0.73*  CREATININE 1.00  --   --  0.94  --   TROPONINIHS 42* 52* 34* 37*  --      Estimated Creatinine Clearance: 104.2 mL/min (by C-G formula based on SCr of 0.94 mg/dL).   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   unk   ibuprofen (ADVIL) 200 MG tablet Take 800 mg by mouth every 6 (six) hours as needed for mild pain.   03/18/2022   lansoprazole (PREVACID) 30 MG capsule TAKE 1 CAPSULE BY MOUTH DAILY BEFORE BREAKFAST. (Patient taking differently: Take 30 mg by mouth daily before breakfast.) 90 capsule 1 03/19/2022   phenytoin (DILANTIN) 100 MG ER capsule TAKE 2 CAPSULES BY MOUTH EVERY MORNING AND TAKE 3 CAPSULES AT BEDTIME (Patient taking differently: Take 200-300 mg by  mouth See admin instructions. Take 2 capsules (200 mg) every morning and then take 3 capsules (300 mg) at bedtime) 450 capsule 1 03/19/2022   potassium chloride (KLOR-CON) 10 MEQ tablet Take 10 mEq by mouth daily.   03/19/2022   Vitamin D, Ergocalciferol, (DRISDOL) 1.25 MG (50000 UNIT) CAPS capsule Take 50,000 Units by mouth daily.   03/19/2022   Assessment: 61 yo M with PMH of seizures, GERD, hiatal hernia presented to the ED with chest pressure and elevated troponins and found to have NSTEMI. Pt does not take any anticoagulants prior to admission. Heparin infusion was initiated per ACS protocol. No bleeding noted.   Hgb 15, Plt 174 - within range Troponin 37 - slightly elevated Heparin Level: 0.73 - elevated out of range - heparin drip rate 1400 units/hr   Goal of Therapy:  Heparin level 0.3-0.7 units/ml Monitor platelets by anticoagulation protocol: Yes   Plan:  Decrease Heparin infusion to 1200 units/hr Re-evaluate for heparin anticoagulation after Cath Lab visit Monitor CBC, heparin levels, and for s/sx of bleeding daily  Gala Murdoch, PharmD Candidate 03/20/2022 10:45 AM

## 2022-03-20 NOTE — Progress Notes (Signed)
ANTICOAGULATION CONSULT NOTE - Follow Up  Pharmacy Consult for Heparin infusion Indication: chest pain/ACS  Allergies  Allergen Reactions   Esomeprazole Magnesium Other (See Comments)    REACTION: abdominal pain   Simvastatin Other (See Comments)    Wasn't working for pt    Alum Hydroxide-Mag Carbonate Other (See Comments)    Medication interaction only    Patient Measurements: Height: '6\' 2"'$  (188 cm) Weight: 100 kg (220 lb 6.4 oz) IBW/kg (Calculated) : 82.2  Vital Signs: Temp: 98 F (36.7 C) (08/31 1502) Temp Source: Oral (08/31 1502) BP: 124/75 (08/31 1531) Pulse Rate: 75 (08/31 1531)  Labs: Recent Labs    03/19/22 1707 03/19/22 1855 03/19/22 2250 03/20/22 0010 03/20/22 0618  HGB 14.4  --   --  15.0  --   HCT 38.9*  --   --  41.4  --   PLT 172  --   --  174  --   HEPARINUNFRC  --   --   --   --  0.73*  CREATININE 1.00  --   --  0.94  --   TROPONINIHS 42* 52* 34* 37*  --      Estimated Creatinine Clearance: 104.2 mL/min (by C-G formula based on SCr of 0.94 mg/dL).   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   unk   ibuprofen (ADVIL) 200 MG tablet Take 800 mg by mouth every 6 (six) hours as needed for mild pain.   03/18/2022   lansoprazole (PREVACID) 30 MG capsule TAKE 1 CAPSULE BY MOUTH DAILY BEFORE BREAKFAST. (Patient taking differently: Take 30 mg by mouth daily before breakfast.) 90 capsule 1 03/19/2022   phenytoin (DILANTIN) 100 MG ER capsule TAKE 2 CAPSULES BY MOUTH EVERY MORNING AND TAKE 3 CAPSULES AT BEDTIME (Patient taking differently: Take 200-300 mg by  mouth See admin instructions. Take 2 capsules (200 mg) every morning and then take 3 capsules (300 mg) at bedtime) 450 capsule 1 03/19/2022   potassium chloride (KLOR-CON) 10 MEQ tablet Take 10 mEq by mouth daily.   03/19/2022   Vitamin D, Ergocalciferol, (DRISDOL) 1.25 MG (50000 UNIT) CAPS capsule Take 50,000 Units by mouth daily.   03/19/2022   Assessment: 61 yo M with PMH of seizures, GERD, hiatal hernia presented to the ED with chest pressure and elevated troponins and found to have NSTEMI. Pt does not take any anticoagulants prior to admission. Heparin infusion was initiated per ACS protocol. No bleeding noted.   Hgb 15, Plt 174 - within range  Heparin adjusted prior to cath. Multivessel CAD noted on cath. Surgery to be consulted. TR band removed at 17:00.   Goal of Therapy:  Heparin level 0.3-0.7 units/ml Monitor platelets by anticoagulation protocol: Yes   Plan:  Restart Heparin infusion at 1200 units/hr Monitor CBC, heparin levels, and for s/sx of bleeding daily  Erin Hearing PharmD., BCPS Clinical Pharmacist 03/20/2022 3:43 PM

## 2022-03-20 NOTE — Progress Notes (Addendum)
Rounding Note    Patient Name: Wayne Vang Date of Encounter: 03/20/2022  [place] Cardiologist:  Gardiner Rhyme  Subjective   61 year old gentleman who presented to the hospital last night with 1 to 34-monthhistory of substernal chest squeezing with exertion.  Troponins were minimally elevated with a peak of 42. ECG showed no ST or T wave changes.  He is pain free this am   Inpatient Medications    Scheduled Meds:  aspirin EC  81 mg Oral Daily   pantoprazole  40 mg Oral Daily   phenytoin  200 mg Oral q morning   phenytoin  300 mg Oral q1800   rosuvastatin  5 mg Oral Daily   sodium chloride flush  3 mL Intravenous Q12H   Continuous Infusions:  sodium chloride     sodium chloride 1 mL/kg/hr (03/20/22 0807)   heparin 1,400 Units/hr (03/20/22 0807)   PRN Meds: sodium chloride, acetaminophen, nitroGLYCERIN, ondansetron (ZOFRAN) IV, sodium chloride flush   Vital Signs    Vitals:   03/19/22 2200 03/20/22 0124 03/20/22 0440 03/20/22 0806  BP: (!) 137/95 117/76 124/77 113/84  Pulse: 78 70 73 72  Resp: '18 16 16 16  '$ Temp: 98.3 F (36.8 C) 97.7 F (36.5 C) 97.8 F (36.6 C) 98 F (36.7 C)  TempSrc: Oral Oral Oral Oral  SpO2: 100% 93% 94% 98%  Weight: 100 kg     Height: '6\' 2"'$  (1.88 m)       Intake/Output Summary (Last 24 hours) at 03/20/2022 0856 Last data filed at 03/20/2022 03154Gross per 24 hour  Intake 980.06 ml  Output --  Net 980.06 ml      03/19/2022   10:00 PM 03/19/2022    3:46 PM 08/20/2021    7:10 AM  Last 3 Weights  Weight (lbs) 220 lb 6.4 oz 235 lb 236 lb  Weight (kg) 99.973 kg 106.595 kg 107.049 kg      Telemetry     NSR - Personally Reviewed  ECG     - Personally Reviewed  Physical Exam   GEN: No acute distress.   Neck: No JVD Cardiac: RRR, no murmurs, rubs, or gallops.  Respiratory: Clear to auscultation bilaterally. GI: Soft, nontender, non-distended  MS: No edema; No deformity. Neuro:  Nonfocal  Psych: Normal affect    Labs    High Sensitivity Troponin:   Recent Labs  Lab 03/19/22 1707 03/19/22 1855 03/19/22 2250 03/20/22 0010  TROPONINIHS 42* 52* 34* 37*     Chemistry Recent Labs  Lab 03/19/22 1707 03/20/22 0010  NA 141 140  K 3.9 4.0  CL 101 104  CO2 26 29  GLUCOSE 95 151*  BUN 12 12  CREATININE 1.00 0.94  CALCIUM 9.6 9.1  PROT 6.7  --   ALBUMIN 4.6  --   AST 27  --   ALT 20  --   ALKPHOS 72  --   BILITOT 0.7  --   GFRNONAA >60 >60  ANIONGAP 14 7    Lipids  Recent Labs  Lab 03/20/22 0010  CHOL 219*  TRIG 41  HDL 82  LDLCALC 129*  CHOLHDL 2.7    Hematology Recent Labs  Lab 03/19/22 1707 03/20/22 0010  WBC 6.4 5.1  RBC 4.51 4.63  HGB 14.4 15.0  HCT 38.9* 41.4  MCV 86.3 89.4  MCH 31.9 32.4  MCHC 37.0* 36.2*  RDW 11.9 12.2  PLT 172 174   Thyroid  Recent Labs  Lab 03/19/22 2250  TSH 1.321    BNPNo results for input(s): "BNP", "PROBNP" in the last 168 hours.  DDimer No results for input(s): "DDIMER" in the last 168 hours.   Radiology    DG Chest Portable 1 View  Result Date: 03/19/2022 CLINICAL DATA:  Chest tightness EXAM: PORTABLE CHEST 1 VIEW COMPARISON:  Chest 11/27/2009 FINDINGS: The heart size and mediastinal contours are within normal limits. Both lungs are clear. The visualized skeletal structures are unremarkable. IMPRESSION: No active disease. Electronically Signed   By: Franchot Gallo M.D.   On: 03/19/2022 17:09    Cardiac Studies    61 yo with strong family hx of CAD Presented with symptoms concerning for UAP  Patient Profile     61 y.o. male    Assessment & Plan     Unstable angina :   agree with plans for cardiac cath .  Dr. Gardiner Rhyme has discussed risk, benefits, options.   He understands and agrees to proceed.   2.  HLD :  LDL is 129  3.    Hyperglycemia:   HbA1c is normal     For questions or updates, please contact [place] Please consult [email or web address] for contact info under        Signed, Mertie Moores,  MD  03/20/2022, 8:56 AM

## 2022-03-20 NOTE — Progress Notes (Signed)
Patient taken for cardiac cath at 1350hrs.

## 2022-03-20 NOTE — H&P (View-Only) (Signed)
Rounding Note    Patient Name: Wayne Vang Date of Encounter: 03/20/2022  [place] Cardiologist:  Gardiner Rhyme  Subjective   61 year old gentleman who presented to the hospital last night with 1 to 60-monthhistory of substernal chest squeezing with exertion.  Troponins were minimally elevated with a peak of 42. ECG showed no ST or T wave changes.  He is pain free this am   Inpatient Medications    Scheduled Meds:  aspirin EC  81 mg Oral Daily   pantoprazole  40 mg Oral Daily   phenytoin  200 mg Oral q morning   phenytoin  300 mg Oral q1800   rosuvastatin  5 mg Oral Daily   sodium chloride flush  3 mL Intravenous Q12H   Continuous Infusions:  sodium chloride     sodium chloride 1 mL/kg/hr (03/20/22 0807)   heparin 1,400 Units/hr (03/20/22 0807)   PRN Meds: sodium chloride, acetaminophen, nitroGLYCERIN, ondansetron (ZOFRAN) IV, sodium chloride flush   Vital Signs    Vitals:   03/19/22 2200 03/20/22 0124 03/20/22 0440 03/20/22 0806  BP: (!) 137/95 117/76 124/77 113/84  Pulse: 78 70 73 72  Resp: '18 16 16 16  '$ Temp: 98.3 F (36.8 C) 97.7 F (36.5 C) 97.8 F (36.6 C) 98 F (36.7 C)  TempSrc: Oral Oral Oral Oral  SpO2: 100% 93% 94% 98%  Weight: 100 kg     Height: '6\' 2"'$  (1.88 m)       Intake/Output Summary (Last 24 hours) at 03/20/2022 0856 Last data filed at 03/20/2022 04540Gross per 24 hour  Intake 980.06 ml  Output --  Net 980.06 ml      03/19/2022   10:00 PM 03/19/2022    3:46 PM 08/20/2021    7:10 AM  Last 3 Weights  Weight (lbs) 220 lb 6.4 oz 235 lb 236 lb  Weight (kg) 99.973 kg 106.595 kg 107.049 kg      Telemetry     NSR - Personally Reviewed  ECG     - Personally Reviewed  Physical Exam   GEN: No acute distress.   Neck: No JVD Cardiac: RRR, no murmurs, rubs, or gallops.  Respiratory: Clear to auscultation bilaterally. GI: Soft, nontender, non-distended  MS: No edema; No deformity. Neuro:  Nonfocal  Psych: Normal affect    Labs    High Sensitivity Troponin:   Recent Labs  Lab 03/19/22 1707 03/19/22 1855 03/19/22 2250 03/20/22 0010  TROPONINIHS 42* 52* 34* 37*     Chemistry Recent Labs  Lab 03/19/22 1707 03/20/22 0010  NA 141 140  K 3.9 4.0  CL 101 104  CO2 26 29  GLUCOSE 95 151*  BUN 12 12  CREATININE 1.00 0.94  CALCIUM 9.6 9.1  PROT 6.7  --   ALBUMIN 4.6  --   AST 27  --   ALT 20  --   ALKPHOS 72  --   BILITOT 0.7  --   GFRNONAA >60 >60  ANIONGAP 14 7    Lipids  Recent Labs  Lab 03/20/22 0010  CHOL 219*  TRIG 41  HDL 82  LDLCALC 129*  CHOLHDL 2.7    Hematology Recent Labs  Lab 03/19/22 1707 03/20/22 0010  WBC 6.4 5.1  RBC 4.51 4.63  HGB 14.4 15.0  HCT 38.9* 41.4  MCV 86.3 89.4  MCH 31.9 32.4  MCHC 37.0* 36.2*  RDW 11.9 12.2  PLT 172 174   Thyroid  Recent Labs  Lab 03/19/22 2250  TSH 1.321    BNPNo results for input(s): "BNP", "PROBNP" in the last 168 hours.  DDimer No results for input(s): "DDIMER" in the last 168 hours.   Radiology    DG Chest Portable 1 View  Result Date: 03/19/2022 CLINICAL DATA:  Chest tightness EXAM: PORTABLE CHEST 1 VIEW COMPARISON:  Chest 11/27/2009 FINDINGS: The heart size and mediastinal contours are within normal limits. Both lungs are clear. The visualized skeletal structures are unremarkable. IMPRESSION: No active disease. Electronically Signed   By: Franchot Gallo M.D.   On: 03/19/2022 17:09    Cardiac Studies    61 yo with strong family hx of CAD Presented with symptoms concerning for UAP  Patient Profile     61 y.o. male    Assessment & Plan     Unstable angina :   agree with plans for cardiac cath .  Dr. Gardiner Rhyme has discussed risk, benefits, options.   He understands and agrees to proceed.   2.  HLD :  LDL is 129  3.    Hyperglycemia:   HbA1c is normal     For questions or updates, please contact [place] Please consult [email or web address] for contact info under        Signed, Mertie Moores,  MD  03/20/2022, 8:56 AM

## 2022-03-21 ENCOUNTER — Inpatient Hospital Stay (HOSPITAL_COMMUNITY): Payer: BC Managed Care – PPO

## 2022-03-21 ENCOUNTER — Observation Stay (HOSPITAL_COMMUNITY): Payer: BC Managed Care – PPO

## 2022-03-21 DIAGNOSIS — E782 Mixed hyperlipidemia: Secondary | ICD-10-CM | POA: Diagnosis not present

## 2022-03-21 DIAGNOSIS — I214 Non-ST elevation (NSTEMI) myocardial infarction: Secondary | ICD-10-CM | POA: Diagnosis present

## 2022-03-21 DIAGNOSIS — Z811 Family history of alcohol abuse and dependence: Secondary | ICD-10-CM | POA: Diagnosis not present

## 2022-03-21 DIAGNOSIS — Z8249 Family history of ischemic heart disease and other diseases of the circulatory system: Secondary | ICD-10-CM | POA: Diagnosis not present

## 2022-03-21 DIAGNOSIS — K219 Gastro-esophageal reflux disease without esophagitis: Secondary | ICD-10-CM | POA: Diagnosis present

## 2022-03-21 DIAGNOSIS — Z809 Family history of malignant neoplasm, unspecified: Secondary | ICD-10-CM | POA: Diagnosis not present

## 2022-03-21 DIAGNOSIS — Z01810 Encounter for preprocedural cardiovascular examination: Secondary | ICD-10-CM

## 2022-03-21 DIAGNOSIS — D62 Acute posthemorrhagic anemia: Secondary | ICD-10-CM | POA: Diagnosis not present

## 2022-03-21 DIAGNOSIS — Z87891 Personal history of nicotine dependence: Secondary | ICD-10-CM | POA: Diagnosis not present

## 2022-03-21 DIAGNOSIS — I2510 Atherosclerotic heart disease of native coronary artery without angina pectoris: Secondary | ICD-10-CM | POA: Diagnosis not present

## 2022-03-21 DIAGNOSIS — Z20822 Contact with and (suspected) exposure to covid-19: Secondary | ICD-10-CM | POA: Diagnosis present

## 2022-03-21 DIAGNOSIS — E8770 Fluid overload, unspecified: Secondary | ICD-10-CM | POA: Diagnosis not present

## 2022-03-21 DIAGNOSIS — R0789 Other chest pain: Secondary | ICD-10-CM | POA: Diagnosis present

## 2022-03-21 DIAGNOSIS — E785 Hyperlipidemia, unspecified: Secondary | ICD-10-CM | POA: Diagnosis present

## 2022-03-21 DIAGNOSIS — Z823 Family history of stroke: Secondary | ICD-10-CM | POA: Diagnosis not present

## 2022-03-21 DIAGNOSIS — R739 Hyperglycemia, unspecified: Secondary | ICD-10-CM | POA: Diagnosis present

## 2022-03-21 DIAGNOSIS — D6959 Other secondary thrombocytopenia: Secondary | ICD-10-CM | POA: Diagnosis not present

## 2022-03-21 DIAGNOSIS — Z9911 Dependence on respirator [ventilator] status: Secondary | ICD-10-CM | POA: Diagnosis not present

## 2022-03-21 DIAGNOSIS — I25110 Atherosclerotic heart disease of native coronary artery with unstable angina pectoris: Secondary | ICD-10-CM | POA: Diagnosis present

## 2022-03-21 DIAGNOSIS — G40909 Epilepsy, unspecified, not intractable, without status epilepticus: Secondary | ICD-10-CM | POA: Diagnosis present

## 2022-03-21 DIAGNOSIS — Z951 Presence of aortocoronary bypass graft: Secondary | ICD-10-CM | POA: Diagnosis not present

## 2022-03-21 DIAGNOSIS — J9811 Atelectasis: Secondary | ICD-10-CM | POA: Diagnosis not present

## 2022-03-21 DIAGNOSIS — Z820 Family history of epilepsy and other diseases of the nervous system: Secondary | ICD-10-CM | POA: Diagnosis not present

## 2022-03-21 DIAGNOSIS — Z79899 Other long term (current) drug therapy: Secondary | ICD-10-CM | POA: Diagnosis not present

## 2022-03-21 DIAGNOSIS — D649 Anemia, unspecified: Secondary | ICD-10-CM | POA: Diagnosis not present

## 2022-03-21 DIAGNOSIS — K5900 Constipation, unspecified: Secondary | ICD-10-CM | POA: Diagnosis present

## 2022-03-21 LAB — BASIC METABOLIC PANEL
Anion gap: 7 (ref 5–15)
BUN: 12 mg/dL (ref 8–23)
CO2: 29 mmol/L (ref 22–32)
Calcium: 8.7 mg/dL — ABNORMAL LOW (ref 8.9–10.3)
Chloride: 106 mmol/L (ref 98–111)
Creatinine, Ser: 1.08 mg/dL (ref 0.61–1.24)
GFR, Estimated: >60 mL/min (ref >60)
Glucose, Bld: 102 mg/dL — ABNORMAL HIGH (ref 70–99)
Potassium: 4.4 mmol/L (ref 3.5–5.1)
Sodium: 142 mmol/L (ref 135–145)

## 2022-03-21 LAB — LIPOPROTEIN A (LPA): Lipoprotein (a): 9.1 nmol/L (ref <75.0)

## 2022-03-21 LAB — PULMONARY FUNCTION TEST
FEF 25-75 Pre: 3.86 L/sec
FEF2575-%Pred-Pre: 116 %
FEV1-%Pred-Pre: 92 %
FEV1-Pre: 3.8 L
FEV1FVC-%Pred-Pre: 106 %
FEV6-%Pred-Pre: 90 %
FEV6-Pre: 4.71 L
FEV6FVC-%Pred-Pre: 104 %
FVC-%Pred-Pre: 87 %
FVC-Pre: 4.73 L
Pre FEV1/FVC ratio: 80 %
Pre FEV6/FVC Ratio: 100 %

## 2022-03-21 LAB — URINALYSIS, ROUTINE W REFLEX MICROSCOPIC
Bilirubin Urine: NEGATIVE
Glucose, UA: NEGATIVE mg/dL
Hgb urine dipstick: NEGATIVE
Ketones, ur: NEGATIVE mg/dL
Leukocytes,Ua: NEGATIVE
Nitrite: NEGATIVE
Protein, ur: NEGATIVE mg/dL
Specific Gravity, Urine: 1.017 (ref 1.005–1.030)
pH: 7 (ref 5.0–8.0)

## 2022-03-21 LAB — CBC
HCT: 40.6 % (ref 39.0–52.0)
Hemoglobin: 14.5 g/dL (ref 13.0–17.0)
MCH: 31.7 pg (ref 26.0–34.0)
MCHC: 35.7 g/dL (ref 30.0–36.0)
MCV: 88.6 fL (ref 80.0–100.0)
Platelets: 159 10*3/uL (ref 150–400)
RBC: 4.58 MIL/uL (ref 4.22–5.81)
RDW: 12 % (ref 11.5–15.5)
WBC: 4.7 10*3/uL (ref 4.0–10.5)
nRBC: 0 % (ref 0.0–0.2)

## 2022-03-21 LAB — HEPARIN LEVEL (UNFRACTIONATED): Heparin Unfractionated: 0.42 IU/mL (ref 0.30–0.70)

## 2022-03-21 MED ORDER — TEMAZEPAM 15 MG PO CAPS: 15.0000 mg | ORAL_CAPSULE | Freq: Once | ORAL | Status: DC | PRN

## 2022-03-21 MED ORDER — CHLORHEXIDINE GLUCONATE 4 % EX LIQD
60.0000 mL | Freq: Once | CUTANEOUS | Status: AC
  Administered 2022-03-25: 4 via TOPICAL
  Filled 2022-03-21: qty 60

## 2022-03-21 MED ORDER — CHLORHEXIDINE GLUCONATE 0.12 % MT SOLN: 15.0000 mL | Freq: Once | OROMUCOSAL | Status: DC

## 2022-03-21 MED ORDER — CHLORHEXIDINE GLUCONATE 4 % EX LIQD
60.0000 mL | Freq: Once | CUTANEOUS | Status: AC
  Administered 2022-03-26: 4 via TOPICAL
  Filled 2022-03-21: qty 60

## 2022-03-21 MED ORDER — ROSUVASTATIN CALCIUM 20 MG PO TABS
40.0000 mg | ORAL_TABLET | Freq: Every day | ORAL | Status: DC
  Administered 2022-03-21 – 2022-03-30 (×9): 40 mg via ORAL
  Filled 2022-03-21 (×10): qty 2

## 2022-03-21 MED ORDER — METOPROLOL TARTRATE 12.5 MG HALF TABLET
12.5000 mg | ORAL_TABLET | Freq: Once | ORAL | Status: AC
  Administered 2022-03-26: 12.5 mg via ORAL
  Filled 2022-03-21: qty 1

## 2022-03-21 MED ORDER — POLYVINYL ALCOHOL 1.4 % OP SOLN
2.0000 [drp] | OPHTHALMIC | Status: DC | PRN
Start: 2022-03-21 — End: 2022-03-26
  Filled 2022-03-21: qty 15

## 2022-03-21 MED ORDER — BISACODYL 5 MG PO TBEC
5.0000 mg | DELAYED_RELEASE_TABLET | Freq: Once | ORAL | Status: AC
  Administered 2022-03-25: 5 mg via ORAL
  Filled 2022-03-21: qty 1

## 2022-03-21 NOTE — Progress Notes (Signed)
ANTICOAGULATION CONSULT NOTE - Follow Up Consult  Pharmacy Consult for heparin Indication:  CAD awaiting possible CABG  Labs: Recent Labs    03/19/22 1707 03/19/22 1855 03/19/22 2250 03/20/22 0010 03/20/22 0618 03/21/22 0510  HGB 14.4  --   --  15.0  --  14.5  HCT 38.9*  --   --  41.4  --  40.6  PLT 172  --   --  174  --  159  HEPARINUNFRC  --   --   --   --  0.73* 0.42  CREATININE 1.00  --   --  0.94  --   --   TROPONINIHS 42* 52* 34* 37*  --   --     Assessment/Plan:  61yo male therapeutic on heparin after resuming post-cath. Will continue infusion at current rate of 1200 units/hr and confirm stable with additional level.   Wynona Neat, PharmD, BCPS  03/21/2022,6:02 AM

## 2022-03-21 NOTE — Progress Notes (Addendum)
Rounding Note    Patient Name: Wayne Vang Date of Encounter: 03/21/2022  [place] Cardiologist: Donato Heinz, MD   Subjective   No chest pain since admit.  No dyspnea.   Inpatient Medications    Scheduled Meds:  aspirin EC  81 mg Oral Daily   pantoprazole  40 mg Oral Daily   phenytoin  200 mg Oral q morning   phenytoin  300 mg Oral q1800   rosuvastatin  5 mg Oral Daily   sodium chloride flush  3 mL Intravenous Q12H   sodium chloride flush  3 mL Intravenous Q12H   Continuous Infusions:  sodium chloride     heparin 1,200 Units/hr (03/20/22 1957)   PRN Meds: sodium chloride, acetaminophen, nitroGLYCERIN, ondansetron (ZOFRAN) IV, sodium chloride flush   Vital Signs    Vitals:   03/20/22 1847 03/20/22 1929 03/21/22 0037 03/21/22 0549  BP: 118/82 130/81 126/82 118/86  Pulse: 91 89 73 62  Resp: _0 Temp:  98.9 F (37.2 C) 98.3 F (36.8 C) 97.8 F (36.6 C)  TempSrc:  Oral Oral Oral  SpO2: 96% 96% 98% 96%  Weight:      Height:        Intake/Output Summary (Last 24 hours) at 03/21/2022 0812 Last data filed at 03/20/2022 1957 Gross per 24 hour  Intake 1670 ml  Output --  Net 1670 ml      03/19/2022   10:00 PM 03/19/2022    3:46 PM 08/20/2021    7:10 AM  Last 3 Weights  Weight (lbs) 220 lb 6.4 oz 235 lb 236 lb  Weight (kg) 99.973 kg 106.595 kg 107.049 kg      Telemetry    NSR - Personally Reviewed  ECG    N/A - Personally Reviewed  Physical Exam   GEN: No acute distress.   Neck: No JVD Cardiac: RRR, no murmurs, rubs, or gallops. Cath site stable.  Respiratory: Clear to auscultation bilaterally. GI: Soft, nontender, non-distended  MS: No edema; No deformity. Neuro:  Nonfocal  Psych: Normal affect   Labs    High Sensitivity Troponin:   Recent Labs  Lab 03/19/22 1707 03/19/22 1855 03/19/22 2250 03/20/22 0010  TROPONINIHS 42* 52* 34* 37*     Chemistry Recent Labs  Lab 03/19/22 1707 03/20/22 0010  03/21/22 0510  NA 141 140 142  K 3.9 4.0 4.4  CL 101 104 106  CO2 _1 GLUCOSE 95 151* 102*  BUN _2 CREATININE 1.00 0.94 1.08  CALCIUM 9.6 9.1 8.7*  PROT 6.7  --   --   ALBUMIN 4.6  --   --   AST 27  --   --   ALT 20  --   --   ALKPHOS 72  --   --   BILITOT 0.7  --   --   GFRNONAA >60 >60 >60  ANIONGAP _3 Lipids  Recent Labs  Lab 03/20/22 0010  CHOL 219*  TRIG 41  HDL 82  LDLCALC 129*  CHOLHDL 2.7    Hematology Recent Labs  Lab 03/19/22 1707 03/20/22 0010 03/21/22 0510  WBC 6.4 5.1 4.7  RBC 4.51 4.63 4.58  HGB 14.4 15.0 14.5  HCT 38.9* 41.4 40.6  MCV 86.3 89.4 88.6  MCH 31.9 32.4 31.7  MCHC 37.0* 36.2* 35.7  RDW 11.9 12.2 12.0  PLT 172 174 159   Thyroid  Recent Labs  Lab 03/19/22 2250  TSH 1.321    BNPNo results for input(s): "BNP", "PROBNP" in the last 168 hours.  DDimer No results for input(s): "DDIMER" in the last 168 hours.   Radiology    CARDIAC CATHETERIZATION  Result Date: 03/20/2022   Prox LAD lesion is 90% stenosed.   1st Mrg lesion is 25% stenosed.   Ost Cx to Prox Cx lesion is 75% stenosed.   The left ventricular systolic function is normal.   LV end diastolic pressure is normal.   The left ventricular ejection fraction is 55-65% by visual estimate.   There is no aortic valve stenosis. Left main equivalent disease with proximal LAD and ostial circumflex disease. Will obtain cardiac surgery consult.  Restart IV heparin 2 hours after TR band removal.   ECHOCARDIOGRAM COMPLETE  Result Date: 03/20/2022    ECHOCARDIOGRAM REPORT   Patient Name:   Wayne Vang Date of Exam: 03/20/2022 Medical Rec #:  [id number]       Height:       74.0 in Accession #:    [id number]      Weight:       220.4 lb Date of Birth:  Mar 26, 1961        BSA:          2.265 m Patient Age:    61 years        BP:           113/84 mmHg Patient Gender: M               HR:           65 bpm. Exam Location:  Inpatient Procedure: 2D Echo, Cardiac Doppler and Color  Doppler Indications:    NSTEMI I21.4  History:        Patient has no prior history of Echocardiogram examinations.                 Risk Factors:Dyslipidemia. NSTEMI.  Sonographer:    Ronny Flurry Referring Phys: [id number] Crista Luria BHAGAT IMPRESSIONS  1. Left ventricular ejection fraction, by estimation, is 60 to 65%. The left ventricle has normal function. The left ventricle has no regional wall motion abnormalities. There is mild concentric left ventricular hypertrophy. Left ventricular diastolic parameters are consistent with Grade I diastolic dysfunction (impaired relaxation).  2. Right ventricular systolic function is normal. The right ventricular size is normal. There is normal pulmonary artery systolic pressure.  3. The mitral valve is normal in structure. Trivial mitral valve regurgitation. No evidence of mitral stenosis.  4. The aortic valve is tricuspid. Aortic valve regurgitation is not visualized. No aortic stenosis is present.  5. The inferior vena cava is normal in size with greater than 50% respiratory variability, suggesting right atrial pressure of 3 mmHg. FINDINGS  Left Ventricle: Left ventricular ejection fraction, by estimation, is 60 to 65%. The left ventricle has normal function. The left ventricle has no regional wall motion abnormalities. The left ventricular internal cavity size was normal in size. There is  mild concentric left ventricular hypertrophy. Left ventricular diastolic parameters are consistent with Grade I diastolic dysfunction (impaired relaxation). Right Ventricle: The right ventricular size is normal. No increase in right ventricular wall thickness. Right ventricular systolic function is normal. There is normal pulmonary artery systolic pressure. The tricuspid regurgitant velocity is 0.94 m/s, and  with an assumed right atrial pressure of 3 mmHg, the estimated right ventricular systolic pressure is 6.5 mmHg. Left Atrium: Left atrial size was normal in size.  Right Atrium:  Right atrial size was normal in size. Pericardium: There is no evidence of pericardial effusion. Mitral Valve: The mitral valve is normal in structure. Trivial mitral valve regurgitation. No evidence of mitral valve stenosis. Tricuspid Valve: The tricuspid valve is normal in structure. Tricuspid valve regurgitation is trivial. No evidence of tricuspid stenosis. Aortic Valve: The aortic valve is tricuspid. Aortic valve regurgitation is not visualized. No aortic stenosis is present. Pulmonic Valve: The pulmonic valve was normal in structure. Pulmonic valve regurgitation is not visualized. No evidence of pulmonic stenosis. Aorta: The aortic root is normal in size and structure. Venous: The inferior vena cava is normal in size with greater than 50% respiratory variability, suggesting right atrial pressure of 3 mmHg. IAS/Shunts: No atrial level shunt detected by color flow Doppler.  LEFT VENTRICLE PLAX 2D LVIDd:         4.30 cm   Diastology LVIDs:         2.80 cm   LV e' medial:    5.98 cm/s LV PW:         1.10 cm   LV E/e' medial:  10.3 LV IVS:        1.20 cm   LV e' lateral:   8.59 cm/s LVOT diam:     2.10 cm   LV E/e' lateral: 7.2 LV SV:         67 LV SV Index:   29 LVOT Area:     3.46 cm  RIGHT VENTRICLE RV S prime:     10.20 cm/s TAPSE (M-mode): 2.0 cm LEFT ATRIUM           Index        RIGHT ATRIUM           Index LA diam:      3.10 cm 1.37 cm/m   RA Area:     17.50 cm LA Vol (A2C): 37.5 ml 16.56 ml/m  RA Volume:   52.40 ml  23.13 ml/m  AORTIC VALVE LVOT Vmax:   89.40 cm/s LVOT Vmean:  58.300 cm/s LVOT VTI:    0.192 m  AORTA Ao Root diam: 3.40 cm Ao Asc diam:  3.30 cm MITRAL VALVE               TRICUSPID VALVE MV Area (PHT): 2.69 cm    TR Peak grad:   3.5 mmHg MV Decel Time: 282 msec    TR Vmax:        94.00 cm/s MV E velocity: 61.77 cm/s MV A velocity: 71.40 cm/s  SHUNTS MV E/A ratio:  0.87        Systemic VTI:  0.19 m                            Systemic Diam: 2.10 cm Skeet Latch MD Electronically  signed by Skeet Latch MD Signature Date/Time: 03/20/2022/2:42:33 PM    Final    DG Chest Portable 1 View  Result Date: 03/19/2022 CLINICAL DATA:  Chest tightness EXAM: PORTABLE CHEST 1 VIEW COMPARISON:  Chest 11/27/2009 FINDINGS: The heart size and mediastinal contours are within normal limits. Both lungs are clear. The visualized skeletal structures are unremarkable. IMPRESSION: No active disease. Electronically Signed   By: Franchot Gallo M.D.   On: 03/19/2022 17:09    Cardiac Studies    Echo 03/20/22 1. Left ventricular ejection fraction, by estimation, is 60 to 65%. The  left ventricle has normal function. The left  ventricle has no regional  wall motion abnormalities. There is mild concentric left ventricular  hypertrophy. Left ventricular diastolic  parameters are consistent with Grade I diastolic dysfunction (impaired  relaxation).   2. Right ventricular systolic function is normal. The right ventricular  size is normal. There is normal pulmonary artery systolic pressure.   3. The mitral valve is normal in structure. Trivial mitral valve  regurgitation. No evidence of mitral stenosis.   4. The aortic valve is tricuspid. Aortic valve regurgitation is not  visualized. No aortic stenosis is present.   5. The inferior vena cava is normal in size with greater than 50%  respiratory variability, suggesting right atrial pressure of 3 mmHg.   LEFT HEART CATH AND CORONARY ANGIOGRAPHY  03/20/22   Conclusion      Prox LAD lesion is 90% stenosed.   1st Mrg lesion is 25% stenosed.   Ost Cx to Prox Cx lesion is 75% stenosed.   The left ventricular systolic function is normal.   LV end diastolic pressure is normal.   The left ventricular ejection fraction is 55-65% by visual estimate.   There is no aortic valve stenosis.   Left main equivalent disease with proximal LAD and ostial circumflex disease.    Will obtain cardiac surgery consult.  Restart IV heparin 2 hours after TR band  removal.   Diagnostic Dominance: Right   Patient Profile     61 y.o. male  with hx of seizures, GERD, hiatal hernia  and family hx of CAD who is being seen for the evaluation of chest pressure and elevated troponin.  Assessment & Plan    NSTEMI/CAD - No recurrent pain since admit - Cath showed 90% pLAD and 75% Ost to pCX >> undergoing CABG evaluation - Cath with preserved LVEF - Continue ASA and statin  - Continue heparin   2. HLD - 03/20/2022: Cholesterol 219; HDL 82; LDL Cholesterol 129; Triglycerides 41; VLDL 8  -LDL goal less than 70 - Had Myalgias on Simvastatin. Currently tolerating Crestor 70m >> will increase to 438mqd. Patient is agree. He is let usKoreanow if similar symptoms.  -Consider OP f/u labs 6-8 weeks given statin initiation this admission.   3. Seizure -Continue phenytoin   For questions or updates, please contact [place] consult [email or web address] for contact info under        Signed, Leanor KailPA  03/21/2022, 8:12 AM     Attending Note:   The patient was seen and examined.  Agree with assessment and plan as noted above.  Changes made to the above note as needed.  Patient seen and independently examined with ViRobbie LisPA .   We discussed all aspects of the encounter. I agree with the assessment and plan as stated above.   Coronary artery disease: The patient has severe two-vessel coronary artery disease.  He is involved the proximal LAD and proximal/ostial left circumflex artery.  The left circumflex stenosis is in a location that would be very difficult to stent successfully without having the stent hanging out in the left main. He is young and otherwise healthy.  I agree that the best option would be to refer him for coronary artery bypass grafting.  He has met with the surgeons.  Surgery is tentatively scheduled for Wednesday.  He will will remain on IV heparin for now.  Continue aspirin, continue rosuvastatin 40 mg a  day   2.  Hyperlipidemia: He is on rosuvastatin.  He admits to  eating a lot of sugary foods including sweet tea.  We discussed improving his diet.  I suggested that he try to follow a Mediterranean diet.   I have spent a total of 40 minutes with patient reviewing hospital  notes , telemetry, EKGs, labs and examining patient as well as establishing an assessment and plan that was discussed with the patient.  > 50% of time was spent in direct patient care.    Thayer Headings, Brooke Bonito., MD, [facility] 03/21/2022, 10:03 AM [address],  [place] Pager [phone]

## 2022-03-21 NOTE — Progress Notes (Signed)
CARDIAC REHAB PHASE I   Stopped by to see pt for pre OHS teaching. CT transport arrived  will return for education when he arrives back to floor.   [phone]  Vanessa Barbara, RN BSN 03/21/2022 9:26 AM

## 2022-03-21 NOTE — Progress Notes (Signed)
CARDIAC REHAB PHASE I     Pre OHS teaching including IS use, move in the tub, OHS handout, OHS booklet,mobility, home needs at discharge, and pain management reviewed. Also reviewed post cath site care. All questions and concerns addressed. Was  able to demonstrate IS today reaching 2500. Will continue to follow.  [phone] Vanessa Barbara, RN BSN 03/21/2022 11:35 AM

## 2022-03-21 NOTE — Progress Notes (Signed)
Pre-CABG Dopplers completed. Refer to "CV Proc" under chart review to view preliminary results.  03/21/2022 6:30 PM Kelby Aline., MHA, RVT, RDCS, RDMS

## 2022-03-22 DIAGNOSIS — I214 Non-ST elevation (NSTEMI) myocardial infarction: Secondary | ICD-10-CM | POA: Diagnosis not present

## 2022-03-22 LAB — BASIC METABOLIC PANEL
Anion gap: 7 (ref 5–15)
BUN: 11 mg/dL (ref 8–23)
CO2: 27 mmol/L (ref 22–32)
Calcium: 9.1 mg/dL (ref 8.9–10.3)
Chloride: 106 mmol/L (ref 98–111)
Creatinine, Ser: 0.86 mg/dL (ref 0.61–1.24)
GFR, Estimated: >60 mL/min (ref >60)
Glucose, Bld: 90 mg/dL (ref 70–99)
Potassium: 3.8 mmol/L (ref 3.5–5.1)
Sodium: 140 mmol/L (ref 135–145)

## 2022-03-22 LAB — HEPARIN LEVEL (UNFRACTIONATED): Heparin Unfractionated: 0.68 IU/mL (ref 0.30–0.70)

## 2022-03-22 LAB — CBC
HCT: 43.9 % (ref 39.0–52.0)
Hemoglobin: 15.8 g/dL (ref 13.0–17.0)
MCH: 31.7 pg (ref 26.0–34.0)
MCHC: 36 g/dL (ref 30.0–36.0)
MCV: 88 fL (ref 80.0–100.0)
Platelets: 168 10*3/uL (ref 150–400)
RBC: 4.99 MIL/uL (ref 4.22–5.81)
RDW: 11.9 % (ref 11.5–15.5)
WBC: 4.6 10*3/uL (ref 4.0–10.5)
nRBC: 0 % (ref 0.0–0.2)

## 2022-03-22 LAB — GLUCOSE, CAPILLARY: Glucose-Capillary: 125 mg/dL — ABNORMAL HIGH (ref 70–99)

## 2022-03-22 LAB — PROTIME-INR
INR: 1 (ref 0.8–1.2)
Prothrombin Time: 13.4 seconds (ref 11.4–15.2)

## 2022-03-22 LAB — APTT: aPTT: 106 seconds — ABNORMAL HIGH (ref 24–36)

## 2022-03-22 NOTE — Progress Notes (Signed)
ANTICOAGULATION CONSULT NOTE - Follow Up Consult  Pharmacy Consult for heparin Indication:  CAD awaiting possible CABG  Labs: Recent Labs    03/19/22 1707 03/19/22 1855 03/19/22 2250 03/20/22 0010 03/20/22 0618 03/21/22 0510 03/22/22 0605  HGB 14.4  --   --  15.0  --  14.5 15.8  HCT 38.9*  --   --  41.4  --  40.6 43.9  PLT 172  --   --  174  --  159 168  HEPARINUNFRC  --   --   --   --  0.73* 0.42 0.68  CREATININE 1.00  --   --  0.94  --  1.08  --   TROPONINIHS 42* 52* 34* 37*  --   --   --      Assessment  61yo male on heparin after resuming post-cath. Patient has planned CABG for 9/4. Current infusion rate 1200 units/hr with heparin level therapeutic at 0.68. CBC stable and no signs/symptoms of bleeding per the nurse.  Plan -Continue heparin 1200 units/hr -Monitor CBC and heparin level daily  Sandford Craze, PharmD. [facility] Acute Care PGY-1 03/22/2022 8:35 AM

## 2022-03-22 NOTE — Progress Notes (Signed)
CARDIAC REHAB PHASE I   Pt is ambulating in hall and in room independently. Tolerating well with no CP or SOB. Pre OHS completed yesterday, pt has no questions or concerns. Will continue to follow.  [phone]  Vanessa Barbara, RN BSN 03/22/2022 8:44 AM

## 2022-03-22 NOTE — Progress Notes (Signed)
Rounding Note    Patient Name: Wayne Vang Date of Encounter: 03/22/2022  [place] Cardiologist: Donato Heinz, MD   Subjective  No angina ambulating in hall on heparin  Inpatient Medications    Scheduled Meds:  aspirin EC  81 mg Oral Daily   [START ON 03/25/2022] bisacodyl  5 mg Oral Once   [START ON 03/25/2022] chlorhexidine  60 mL Topical Once   And   [START ON 03/26/2022] chlorhexidine  60 mL Topical Once   [START ON 03/26/2022] chlorhexidine  15 mL Mouth/Throat Once   [START ON 03/26/2022] metoprolol tartrate  12.5 mg Oral Once   pantoprazole  40 mg Oral Daily   phenytoin  200 mg Oral q morning   phenytoin  300 mg Oral q1800   rosuvastatin  40 mg Oral Daily   sodium chloride flush  3 mL Intravenous Q12H   sodium chloride flush  3 mL Intravenous Q12H   Continuous Infusions:  sodium chloride     heparin 1,200 Units/hr (03/21/22 1520)   PRN Meds: sodium chloride, acetaminophen, nitroGLYCERIN, ondansetron (ZOFRAN) IV, polyvinyl alcohol, sodium chloride flush, [START ON 03/25/2022] temazepam   Vital Signs    Vitals:   03/21/22 0037 03/21/22 0549 03/21/22 2031 03/22/22 0608  BP: 126/82 118/86 129/80 120/74  Pulse: 73 62 70 (!) 58  Resp: '20 20 19 18  '$ Temp: 98.3 F (36.8 C) 97.8 F (36.6 C) 98.3 F (36.8 C) 98.5 F (36.9 C)  TempSrc: Oral Oral Oral Oral  SpO2: 98% 96% 95% 96%  Weight:      Height:       No intake or output data in the 24 hours ending 03/22/22 0743     03/19/2022   10:00 PM 03/19/2022    3:46 PM 08/20/2021    7:10 AM  Last 3 Weights  Weight (lbs) 220 lb 6.4 oz 235 lb 236 lb  Weight (kg) 99.973 kg 106.595 kg 107.049 kg      Telemetry    NSR - Personally Reviewed  ECG    N/A - Personally Reviewed  Physical Exam  Affect appropriate Healthy:  appears stated age HEENT: normal Neck supple with no adenopathy JVP normal no bruits no thyromegaly Lungs clear with no wheezing and good diaphragmatic motion Heart:  S1/S2 no  murmur, no rub, gallop or click PMI normal Abdomen: benighn, BS positve, no tenderness, no AAA no bruit.  No HSM or HJR Distal pulses intact with no bruits No edema Neuro non-focal Skin warm and dry No muscular weakness   Labs    High Sensitivity Troponin:   Recent Labs  Lab 03/19/22 1707 03/19/22 1855 03/19/22 2250 03/20/22 0010  TROPONINIHS 42* 52* 34* 37*     Chemistry Recent Labs  Lab 03/19/22 1707 03/20/22 0010 03/21/22 0510  NA 141 140 142  K 3.9 4.0 4.4  CL 101 104 106  CO2 '26 29 29  '$ GLUCOSE 95 151* 102*  BUN '12 12 12  '$ CREATININE 1.00 0.94 1.08  CALCIUM 9.6 9.1 8.7*  PROT 6.7  --   --   ALBUMIN 4.6  --   --   AST 27  --   --   ALT 20  --   --   ALKPHOS 72  --   --   BILITOT 0.7  --   --   GFRNONAA >60 >60 >60  ANIONGAP '14 7 7    '$ Lipids  Recent Labs  Lab 03/20/22 0010  CHOL 219*  TRIG 41  HDL 82  LDLCALC 129*  CHOLHDL 2.7    Hematology Recent Labs  Lab 03/19/22 1707 03/20/22 0010 03/21/22 0510  WBC 6.4 5.1 4.7  RBC 4.51 4.63 4.58  HGB 14.4 15.0 14.5  HCT 38.9* 41.4 40.6  MCV 86.3 89.4 88.6  MCH 31.9 32.4 31.7  MCHC 37.0* 36.2* 35.7  RDW 11.9 12.2 12.0  PLT 172 174 159   Thyroid  Recent Labs  Lab 03/19/22 2250  TSH 1.321    BNPNo results for input(s): "BNP", "PROBNP" in the last 168 hours.  DDimer No results for input(s): "DDIMER" in the last 168 hours.   Radiology    VAS US DOPPLER PRE CABG  Result Date: 03/21/2022 PREOPERATIVE VASCULAR EVALUATION Patient Name:  Wayne Vang  Date of Exam:   03/21/2022 Medical Rec #: [id number]        Accession #:    [id number] Date of Birth: Feb 07, 1961         Patient Gender: M Patient Age:   61 years Exam Location:  [facility] Procedure:      VAS US DOPPLER PRE CABG Referring Phys: [name] --------------------------------------------------------------------------------  Indications:      Pre-CABG. Risk Factors:     Hyperlipidemia, past history of smoking. Comparison Study: No prior  study Performing Technologist: Maudry Mayhew MHA, RDMS, RVT, RDCS  Examination Guidelines: A complete evaluation includes B-mode imaging, spectral Doppler, color Doppler, and power Doppler as needed of all accessible portions of each vessel. Bilateral testing is considered an integral part of a complete examination. Limited examinations for reoccurring indications may be performed as noted.  Right Carotid Findings: +----------+--------+--------+--------+----------------------+--------+           PSV cm/sEDV cm/sStenosisDescribe              Comments +----------+--------+--------+--------+----------------------+--------+ CCA Prox  90      20                                             +----------+--------+--------+--------+----------------------+--------+ CCA Distal77      22                                             +----------+--------+--------+--------+----------------------+--------+ ICA Prox  50      21              hyperechoic and smooth         +----------+--------+--------+--------+----------------------+--------+ ICA Distal89      36                                             +----------+--------+--------+--------+----------------------+--------+ ECA       75      7                                              +----------+--------+--------+--------+----------------------+--------+ +----------+--------+-------+----------------+------------+           PSV cm/sEDV cmsDescribe        Arm Pressure +----------+--------+-------+----------------+------------+ [id number]  Multiphasic, WNL             +----------+--------+-------+----------------+------------+ +---------+--------+--+--------+--+---------+ VertebralPSV cm/s45EDV cm/s14Antegrade +---------+--------+--+--------+--+---------+ Left Carotid Findings: +----------+--------+--------+--------+-----------------------+--------+           PSV cm/sEDV cm/sStenosisDescribe                Comments +----------+--------+--------+--------+-----------------------+--------+ CCA Prox  98      20                                              +----------+--------+--------+--------+-----------------------+--------+ CCA Distal110     31                                              +----------+--------+--------+--------+-----------------------+--------+ ICA Prox  48      15              smooth and heterogenous         +----------+--------+--------+--------+-----------------------+--------+ ICA Distal82      37                                              +----------+--------+--------+--------+-----------------------+--------+ ECA       81      16                                              +----------+--------+--------+--------+-----------------------+--------+ +----------+--------+--------+----------------+------------+ SubclavianPSV cm/sEDV cm/sDescribe        Arm Pressure +----------+--------+--------+----------------+------------+           93              Multiphasic, WNL             +----------+--------+--------+----------------+------------+ +---------+--------+--+--------+--+---------+ VertebralPSV cm/s54EDV cm/s22Antegrade +---------+--------+--+--------+--+---------+  ABI Findings: +--------+------------------+-----+---------+--------+ Right   Rt Pressure (mmHg)IndexWaveform Comment  +--------+------------------+-----+---------+--------+ Brachial                       triphasic         +--------+------------------+-----+---------+--------+ PTA                            triphasic         +--------+------------------+-----+---------+--------+ DP                             triphasic         +--------+------------------+-----+---------+--------+ +--------+------------------+-----+---------+-------+ Left    Lt Pressure (mmHg)IndexWaveform Comment +--------+------------------+-----+---------+-------+ Brachial                        triphasic        +--------+------------------+-----+---------+-------+ PTA                            triphasic        +--------+------------------+-----+---------+-------+ DP  triphasic        +--------+------------------+-----+---------+-------+  Right Doppler Findings: +-----------+--------+-----+---------+--------------------+ Site       PressureIndexDoppler  Comments             +-----------+--------+-----+---------+--------------------+ Brachial                triphasic                     +-----------+--------+-----+---------+--------------------+ Radial                  triphasic                     +-----------+--------+-----+---------+--------------------+ Ulnar                   triphasic                     +-----------+--------+-----+---------+--------------------+ Palmar Arch                      Within normal limits +-----------+--------+-----+---------+--------------------+  Left Doppler Findings: +-----------+--------+-----+---------+--------------------+ Site       PressureIndexDoppler  Comments             +-----------+--------+-----+---------+--------------------+ Brachial                triphasic                     +-----------+--------+-----+---------+--------------------+ Radial                  triphasic                     +-----------+--------+-----+---------+--------------------+ Ulnar                   triphasic                     +-----------+--------+-----+---------+--------------------+ Palmar Arch                      Within normal limits +-----------+--------+-----+---------+--------------------+   Summary: Right Carotid: Velocities in the right ICA are consistent with a 1-39% stenosis. Left Carotid: Velocities in the left ICA are consistent with a 1-39% stenosis. Vertebrals:  Bilateral vertebral arteries demonstrate antegrade flow. Subclavians: Normal flow  hemodynamics were seen in bilateral subclavian              arteries. Pedal pulses: Resting bilateral pedal arteries are triphasic. Right Upper Extremity: Doppler waveforms remain within normal limits with right radial compression. Doppler waveforms remain within normal limits with right ulnar compression. Left Upper Extremity: Doppler waveforms remain within normal limits with left radial compression. Doppler waveforms remain within normal limits with left ulnar compression.    Preliminary    DG Chest 2 View  Result Date: 03/21/2022 CLINICAL DATA:  Chest pain EXAM: CHEST - 2 VIEW COMPARISON:  Chest radiograph 03/19/2022 FINDINGS: The heart size and mediastinal contours are within normal limits. Both lungs are clear. The visualized skeletal structures are unremarkable. IMPRESSION: No active cardiopulmonary disease. Electronically Signed   By: Lovey Newcomer M.D.   On: 03/21/2022 13:10   CT CHEST WO CONTRAST  Result Date: 03/21/2022 CLINICAL DATA:  Preoperative evaluation. EXAM: CT CHEST WITHOUT CONTRAST TECHNIQUE: Multidetector CT imaging of the chest was performed following the standard protocol without IV contrast. RADIATION DOSE REDUCTION: This exam was performed according to the departmental dose-optimization program which includes automated exposure control, adjustment of the mA  and/or kV according to patient size and/or use of iterative reconstruction technique. COMPARISON:  None Available. FINDINGS: Cardiovascular: Heart is normal in size. Coronary arterial vascular calcifications. Distal transverse aortic vascular calcifications. No pericardial effusion. Mediastinum/Nodes: No enlarged axillary, mediastinal or hilar lymphadenopathy. Lungs/Pleura: Minimal left basilar atelectasis. No large area pulmonary consolidation. No pleural effusion or pneumothorax. Upper Abdomen: No acute process. Musculoskeletal: Thoracic spine degenerative changes. No aggressive or acute appearing osseous lesions. IMPRESSION: No  acute process. Distal transverse thoracic aortic calcifications. Coronary arterial vascular calcifications. Electronically Signed   By: Lovey Newcomer M.D.   On: 03/21/2022 09:54   CARDIAC CATHETERIZATION  Result Date: 03/20/2022   Prox LAD lesion is 90% stenosed.   1st Mrg lesion is 25% stenosed.   Ost Cx to Prox Cx lesion is 75% stenosed.   The left ventricular systolic function is normal.   LV end diastolic pressure is normal.   The left ventricular ejection fraction is 55-65% by visual estimate.   There is no aortic valve stenosis. Left main equivalent disease with proximal LAD and ostial circumflex disease. Will obtain cardiac surgery consult.  Restart IV heparin 2 hours after TR band removal.   ECHOCARDIOGRAM COMPLETE  Result Date: 03/20/2022    ECHOCARDIOGRAM REPORT   Patient Name:   Wayne Vang Date of Exam: 03/20/2022 Medical Rec #:  [id number]       Height:       74.0 in Accession #:    [id number]      Weight:       220.4 lb Date of Birth:  1960/11/22        BSA:          2.265 m Patient Age:    32 years        BP:           113/84 mmHg Patient Gender: M               HR:           65 bpm. Exam Location:  Inpatient Procedure: 2D Echo, Cardiac Doppler and Color Doppler Indications:    NSTEMI I21.4  History:        Patient has no prior history of Echocardiogram examinations.                 Risk Factors:Dyslipidemia. NSTEMI.  Sonographer:    Ronny Flurry Referring Phys: [id number] Crista Luria BHAGAT IMPRESSIONS  1. Left ventricular ejection fraction, by estimation, is 60 to 65%. The left ventricle has normal function. The left ventricle has no regional wall motion abnormalities. There is mild concentric left ventricular hypertrophy. Left ventricular diastolic parameters are consistent with Grade I diastolic dysfunction (impaired relaxation).  2. Right ventricular systolic function is normal. The right ventricular size is normal. There is normal pulmonary artery systolic pressure.  3. The mitral valve  is normal in structure. Trivial mitral valve regurgitation. No evidence of mitral stenosis.  4. The aortic valve is tricuspid. Aortic valve regurgitation is not visualized. No aortic stenosis is present.  5. The inferior vena cava is normal in size with greater than 50% respiratory variability, suggesting right atrial pressure of 3 mmHg. FINDINGS  Left Ventricle: Left ventricular ejection fraction, by estimation, is 60 to 65%. The left ventricle has normal function. The left ventricle has no regional wall motion abnormalities. The left ventricular internal cavity size was normal in size. There is  mild concentric left ventricular hypertrophy. Left ventricular diastolic parameters are consistent with Grade I diastolic  dysfunction (impaired relaxation). Right Ventricle: The right ventricular size is normal. No increase in right ventricular wall thickness. Right ventricular systolic function is normal. There is normal pulmonary artery systolic pressure. The tricuspid regurgitant velocity is 0.94 m/s, and  with an assumed right atrial pressure of 3 mmHg, the estimated right ventricular systolic pressure is 6.5 mmHg. Left Atrium: Left atrial size was normal in size. Right Atrium: Right atrial size was normal in size. Pericardium: There is no evidence of pericardial effusion. Mitral Valve: The mitral valve is normal in structure. Trivial mitral valve regurgitation. No evidence of mitral valve stenosis. Tricuspid Valve: The tricuspid valve is normal in structure. Tricuspid valve regurgitation is trivial. No evidence of tricuspid stenosis. Aortic Valve: The aortic valve is tricuspid. Aortic valve regurgitation is not visualized. No aortic stenosis is present. Pulmonic Valve: The pulmonic valve was normal in structure. Pulmonic valve regurgitation is not visualized. No evidence of pulmonic stenosis. Aorta: The aortic root is normal in size and structure. Venous: The inferior vena cava is normal in size with greater than 50%  respiratory variability, suggesting right atrial pressure of 3 mmHg. IAS/Shunts: No atrial level shunt detected by color flow Doppler.  LEFT VENTRICLE PLAX 2D LVIDd:         4.30 cm   Diastology LVIDs:         2.80 cm   LV e' medial:    5.98 cm/s LV PW:         1.10 cm   LV E/e' medial:  10.3 LV IVS:        1.20 cm   LV e' lateral:   8.59 cm/s LVOT diam:     2.10 cm   LV E/e' lateral: 7.2 LV SV:         67 LV SV Index:   29 LVOT Area:     3.46 cm  RIGHT VENTRICLE RV S prime:     10.20 cm/s TAPSE (M-mode): 2.0 cm LEFT ATRIUM           Index        RIGHT ATRIUM           Index LA diam:      3.10 cm 1.37 cm/m   RA Area:     17.50 cm LA Vol (A2C): 37.5 ml 16.56 ml/m  RA Volume:   52.40 ml  23.13 ml/m  AORTIC VALVE LVOT Vmax:   89.40 cm/s LVOT Vmean:  58.300 cm/s LVOT VTI:    0.192 m  AORTA Ao Root diam: 3.40 cm Ao Asc diam:  3.30 cm MITRAL VALVE               TRICUSPID VALVE MV Area (PHT): 2.69 cm    TR Peak grad:   3.5 mmHg MV Decel Time: 282 msec    TR Vmax:        94.00 cm/s MV E velocity: 61.77 cm/s MV A velocity: 71.40 cm/s  SHUNTS MV E/A ratio:  0.87        Systemic VTI:  0.19 m                            Systemic Diam: 2.10 cm Skeet Latch MD Electronically signed by Skeet Latch MD Signature Date/Time: 03/20/2022/2:42:33 PM    Final     Cardiac Studies    Echo 03/20/22 1. Left ventricular ejection fraction, by estimation, is 60 to 65%. The  left ventricle has normal function.  The left ventricle has no regional  wall motion abnormalities. There is mild concentric left ventricular  hypertrophy. Left ventricular diastolic  parameters are consistent with Grade I diastolic dysfunction (impaired  relaxation).   2. Right ventricular systolic function is normal. The right ventricular  size is normal. There is normal pulmonary artery systolic pressure.   3. The mitral valve is normal in structure. Trivial mitral valve  regurgitation. No evidence of mitral stenosis.   4. The aortic valve is  tricuspid. Aortic valve regurgitation is not  visualized. No aortic stenosis is present.   5. The inferior vena cava is normal in size with greater than 50%  respiratory variability, suggesting right atrial pressure of 3 mmHg.   LEFT HEART CATH AND CORONARY ANGIOGRAPHY  03/20/22   Conclusion      Prox LAD lesion is 90% stenosed.   1st Mrg lesion is 25% stenosed.   Ost Cx to Prox Cx lesion is 75% stenosed.   The left ventricular systolic function is normal.   LV end diastolic pressure is normal.   The left ventricular ejection fraction is 55-65% by visual estimate.   There is no aortic valve stenosis.   Left main equivalent disease with proximal LAD and ostial circumflex disease.    Will obtain cardiac surgery consult.  Restart IV heparin 2 hours after TR band removal.   Diagnostic Dominance: Right   Patient Profile     61 y.o. male  with hx of seizures, GERD, hiatal hernia  and family hx of CAD who is being seen for the evaluation of chest pressure and elevated troponin.  Assessment & Plan    NSTEMI/CAD - On heparin - surgery Wednesday with Dr Lavonna Monarch   2. HLD - 03/20/2022: Cholesterol 219; HDL 82; LDL Cholesterol 129; Triglycerides 41; VLDL 8  -LDL goal less than 70 - Had Myalgias on Simvastatin. Currently tolerating Crestor '5mg'$  >> will increase to '40mg'$  qd. Patient is agree. He is let us know if similar symptoms.  -Consider OP f/u labs 6-8 weeks given statin initiation this admission.   3. Seizure -Continue phenytoin   For questions or updates, please contact [place] Please consult [email or web address] for contact info under        Signed, Jenkins Rouge, MD  03/22/2022, 7:43 AM

## 2022-03-23 DIAGNOSIS — I214 Non-ST elevation (NSTEMI) myocardial infarction: Secondary | ICD-10-CM | POA: Diagnosis not present

## 2022-03-23 LAB — CBC
HCT: 40.6 % (ref 39.0–52.0)
Hemoglobin: 15 g/dL (ref 13.0–17.0)
MCH: 32 pg (ref 26.0–34.0)
MCHC: 36.9 g/dL — ABNORMAL HIGH (ref 30.0–36.0)
MCV: 86.6 fL (ref 80.0–100.0)
Platelets: 152 10*3/uL (ref 150–400)
RBC: 4.69 MIL/uL (ref 4.22–5.81)
RDW: 11.8 % (ref 11.5–15.5)
WBC: 5.8 10*3/uL (ref 4.0–10.5)
nRBC: 0 % (ref 0.0–0.2)

## 2022-03-23 LAB — SURGICAL PCR SCREEN
MRSA, PCR: NEGATIVE
Staphylococcus aureus: NEGATIVE

## 2022-03-23 LAB — BASIC METABOLIC PANEL
Anion gap: 10 (ref 5–15)
BUN: 14 mg/dL (ref 8–23)
CO2: 24 mmol/L (ref 22–32)
Calcium: 8.8 mg/dL — ABNORMAL LOW (ref 8.9–10.3)
Chloride: 104 mmol/L (ref 98–111)
Creatinine, Ser: 1.06 mg/dL (ref 0.61–1.24)
GFR, Estimated: >60 mL/min (ref >60)
Glucose, Bld: 102 mg/dL — ABNORMAL HIGH (ref 70–99)
Potassium: 3.8 mmol/L (ref 3.5–5.1)
Sodium: 138 mmol/L (ref 135–145)

## 2022-03-23 LAB — HEPARIN LEVEL (UNFRACTIONATED): Heparin Unfractionated: 0.37 IU/mL (ref 0.30–0.70)

## 2022-03-23 LAB — ABO/RH: ABO/RH(D): O POS

## 2022-03-23 NOTE — Progress Notes (Signed)
Rounding Note    Patient Name: Wayne Vang Date of Encounter: 03/23/2022  [place] Cardiologist: Donato Heinz, MD   Subjective  No angina ambulating in hall on heparin  Stable awaiting CABG  Inpatient Medications    Scheduled Meds:  aspirin EC  81 mg Oral Daily   [START ON 03/25/2022] bisacodyl  5 mg Oral Once   [START ON 03/25/2022] chlorhexidine  60 mL Topical Once   And   [START ON 03/26/2022] chlorhexidine  60 mL Topical Once   [START ON 03/26/2022] chlorhexidine  15 mL Mouth/Throat Once   [START ON 03/26/2022] metoprolol tartrate  12.5 mg Oral Once   pantoprazole  40 mg Oral Daily   phenytoin  200 mg Oral q morning   phenytoin  300 mg Oral q1800   rosuvastatin  40 mg Oral Daily   sodium chloride flush  3 mL Intravenous Q12H   sodium chloride flush  3 mL Intravenous Q12H   Continuous Infusions:  sodium chloride     heparin 1,200 Units/hr (03/22/22 1235)   PRN Meds: sodium chloride, acetaminophen, nitroGLYCERIN, ondansetron (ZOFRAN) IV, polyvinyl alcohol, sodium chloride flush, [START ON 03/25/2022] temazepam   Vital Signs    Vitals:   03/21/22 2031 03/22/22 0608 03/22/22 1622 03/22/22 2007  BP: 129/80 120/74 (!) 143/81 125/85  Pulse: 70 (!) 58 84 73  Resp: '19 18 18 18  '$ Temp: 98.3 F (36.8 C) 98.5 F (36.9 C) 97.6 F (36.4 C) 97.9 F (36.6 C)  TempSrc: Oral Oral Axillary Oral  SpO2: 95% 96% 98% 94%  Weight:      Height:        Intake/Output Summary (Last 24 hours) at 03/23/2022 0826 Last data filed at 03/22/2022 2148 Gross per 24 hour  Intake 393 ml  Output --  Net 393 ml       03/19/2022   10:00 PM 03/19/2022    3:46 PM 08/20/2021    7:10 AM  Last 3 Weights  Weight (lbs) 220 lb 6.4 oz 235 lb 236 lb  Weight (kg) 99.973 kg 106.595 kg 107.049 kg      Telemetry    NSR - Personally Reviewed  ECG    N/A - Personally Reviewed  Physical Exam  Affect appropriate Healthy:  appears stated age HEENT: normal Neck supple with no  adenopathy JVP normal no bruits no thyromegaly Lungs clear with no wheezing and good diaphragmatic motion Heart:  S1/S2 no murmur, no rub, gallop or click PMI normal Abdomen: benighn, BS positve, no tenderness, no AAA no bruit.  No HSM or HJR Distal pulses intact with no bruits No edema Neuro non-focal Skin warm and dry No muscular weakness   Labs    High Sensitivity Troponin:   Recent Labs  Lab 03/19/22 1707 03/19/22 1855 03/19/22 2250 03/20/22 0010  TROPONINIHS 42* 52* 34* 37*     Chemistry Recent Labs  Lab 03/19/22 1707 03/20/22 0010 03/21/22 0510 03/22/22 0605 03/23/22 0154  NA 141   < > 142 140 138  K 3.9   < > 4.4 3.8 3.8  CL 101   < > 106 106 104  CO2 26   < > '29 27 24  '$ GLUCOSE 95   < > 102* 90 102*  BUN 12   < > '12 11 14  '$ CREATININE 1.00   < > 1.08 0.86 1.06  CALCIUM 9.6   < > 8.7* 9.1 8.8*  PROT 6.7  --   --   --   --  ALBUMIN 4.6  --   --   --   --   AST 27  --   --   --   --   ALT 20  --   --   --   --   ALKPHOS 72  --   --   --   --   BILITOT 0.7  --   --   --   --   GFRNONAA >60   < > >60 >60 >60  ANIONGAP 14   < > '7 7 10   '$ < > = values in this interval not displayed.    Lipids  Recent Labs  Lab 03/20/22 0010  CHOL 219*  TRIG 41  HDL 82  LDLCALC 129*  CHOLHDL 2.7    Hematology Recent Labs  Lab 03/21/22 0510 03/22/22 0605 03/23/22 0154  WBC 4.7 4.6 5.8  RBC 4.58 4.99 4.69  HGB 14.5 15.8 15.0  HCT 40.6 43.9 40.6  MCV 88.6 88.0 86.6  MCH 31.7 31.7 32.0  MCHC 35.7 36.0 36.9*  RDW 12.0 11.9 11.8  PLT 159 168 152   Thyroid  Recent Labs  Lab 03/19/22 2250  TSH 1.321    BNPNo results for input(s): "BNP", "PROBNP" in the last 168 hours.  DDimer No results for input(s): "DDIMER" in the last 168 hours.   Radiology    VAS US DOPPLER PRE CABG  Result Date: 03/22/2022 PREOPERATIVE VASCULAR EVALUATION Patient Name:  AHAAN ZOBRIST  Date of Exam:   03/21/2022 Medical Rec #: [id number]        Accession #:    [id number] Date of Birth:  January 17, 1961         Patient Gender: M Patient Age:   73 years Exam [facility]  [facility] Procedure:      VAS US DOPPLER PRE CABG Referring Phys: [name] --------------------------------------------------------------------------------  Indications:      Pre-CABG. Risk Factors:     Hyperlipidemia, past history of smoking. Comparison Study: No prior study Performing Technologist: Maudry Mayhew MHA, RDMS, RVT, RDCS  Examination Guidelines: A complete evaluation includes B-mode imaging, spectral Doppler, color Doppler, and power Doppler as needed of all accessible portions of each vessel. Bilateral testing is considered an integral part of a complete examination. Limited examinations for reoccurring indications may be performed as noted.  Right Carotid Findings: +----------+--------+--------+--------+----------------------+--------+           PSV cm/sEDV cm/sStenosisDescribe              Comments +----------+--------+--------+--------+----------------------+--------+ CCA Prox  90      20                                             +----------+--------+--------+--------+----------------------+--------+ CCA Distal77      22                                             +----------+--------+--------+--------+----------------------+--------+ ICA Prox  50      21              hyperechoic and smooth         +----------+--------+--------+--------+----------------------+--------+ ICA Distal89      36                                             +----------+--------+--------+--------+----------------------+--------+  ECA       75      7                                              +----------+--------+--------+--------+----------------------+--------+ +----------+--------+-------+----------------+------------+           PSV cm/sEDV cmsDescribe        Arm Pressure +----------+--------+-------+----------------+------------+ Subclavian85             Multiphasic, WNL              +----------+--------+-------+----------------+------------+ +---------+--------+--+--------+--+---------+ VertebralPSV cm/s45EDV cm/s14Antegrade +---------+--------+--+--------+--+---------+ Left Carotid Findings: +----------+--------+--------+--------+-----------------------+--------+           PSV cm/sEDV cm/sStenosisDescribe               Comments +----------+--------+--------+--------+-----------------------+--------+ CCA Prox  98      20                                              +----------+--------+--------+--------+-----------------------+--------+ CCA Distal110     31                                              +----------+--------+--------+--------+-----------------------+--------+ ICA Prox  48      15              smooth and heterogenous         +----------+--------+--------+--------+-----------------------+--------+ ICA Distal82      37                                              +----------+--------+--------+--------+-----------------------+--------+ ECA       81      16                                              +----------+--------+--------+--------+-----------------------+--------+ +----------+--------+--------+----------------+------------+ SubclavianPSV cm/sEDV cm/sDescribe        Arm Pressure +----------+--------+--------+----------------+------------+           93              Multiphasic, WNL             +----------+--------+--------+----------------+------------+ +---------+--------+--+--------+--+---------+ VertebralPSV cm/s54EDV cm/s22Antegrade +---------+--------+--+--------+--+---------+  ABI Findings: +--------+------------------+-----+---------+--------+ Right   Rt Pressure (mmHg)IndexWaveform Comment  +--------+------------------+-----+---------+--------+ Brachial                       triphasic         +--------+------------------+-----+---------+--------+ PTA                             triphasic         +--------+------------------+-----+---------+--------+ DP                             triphasic         +--------+------------------+-----+---------+--------+ +--------+------------------+-----+---------+-------+ Left    Lt Pressure (mmHg)IndexWaveform Comment +--------+------------------+-----+---------+-------+  Brachial                       triphasic        +--------+------------------+-----+---------+-------+ PTA                            triphasic        +--------+------------------+-----+---------+-------+ DP                             triphasic        +--------+------------------+-----+---------+-------+  Right Doppler Findings: +-----------+--------+-----+---------+--------------------+ Site       PressureIndexDoppler  Comments             +-----------+--------+-----+---------+--------------------+ Brachial                triphasic                     +-----------+--------+-----+---------+--------------------+ Radial                  triphasic                     +-----------+--------+-----+---------+--------------------+ Ulnar                   triphasic                     +-----------+--------+-----+---------+--------------------+ Palmar Arch                      Within normal limits +-----------+--------+-----+---------+--------------------+  Left Doppler Findings: +-----------+--------+-----+---------+--------------------+ Site       PressureIndexDoppler  Comments             +-----------+--------+-----+---------+--------------------+ Brachial                triphasic                     +-----------+--------+-----+---------+--------------------+ Radial                  triphasic                     +-----------+--------+-----+---------+--------------------+ Ulnar                   triphasic                     +-----------+--------+-----+---------+--------------------+ Palmar Arch                       Within normal limits +-----------+--------+-----+---------+--------------------+   Summary: Right Carotid: Velocities in the right ICA are consistent with a 1-39% stenosis. Left Carotid: Velocities in the left ICA are consistent with a 1-39% stenosis. Vertebrals:  Bilateral vertebral arteries demonstrate antegrade flow. Subclavians: Normal flow hemodynamics were seen in bilateral subclavian              arteries. Pedal pulses: Resting bilateral pedal arteries are triphasic. Right Upper Extremity: Doppler waveforms remain within normal limits with right radial compression. Doppler waveforms remain within normal limits with right ulnar compression. Left Upper Extremity: Doppler waveforms remain within normal limits with left radial compression. Doppler waveforms remain within normal limits with left ulnar compression.  Electronically signed by Monica Martinez MD on 03/22/2022 at 52:48:19 AM.    Final    DG Chest 2 View  Result Date: 03/21/2022 CLINICAL DATA:  Chest pain EXAM: CHEST - 2 VIEW COMPARISON:  Chest radiograph 03/19/2022 FINDINGS: The heart size and mediastinal contours are within normal limits. Both lungs are clear. The visualized skeletal structures are unremarkable. IMPRESSION: No active cardiopulmonary disease. Electronically Signed   By: Lovey Newcomer M.D.   On: 03/21/2022 13:10   CT CHEST WO CONTRAST  Result Date: 03/21/2022 CLINICAL DATA:  Preoperative evaluation. EXAM: CT CHEST WITHOUT CONTRAST TECHNIQUE: Multidetector CT imaging of the chest was performed following the standard protocol without IV contrast. RADIATION DOSE REDUCTION: This exam was performed according to the departmental dose-optimization program which includes automated exposure control, adjustment of the mA and/or kV according to patient size and/or use of iterative reconstruction technique. COMPARISON:  None Available. FINDINGS: Cardiovascular: Heart is normal in size. Coronary arterial vascular calcifications. Distal  transverse aortic vascular calcifications. No pericardial effusion. Mediastinum/Nodes: No enlarged axillary, mediastinal or hilar lymphadenopathy. Lungs/Pleura: Minimal left basilar atelectasis. No large area pulmonary consolidation. No pleural effusion or pneumothorax. Upper Abdomen: No acute process. Musculoskeletal: Thoracic spine degenerative changes. No aggressive or acute appearing osseous lesions. IMPRESSION: No acute process. Distal transverse thoracic aortic calcifications. Coronary arterial vascular calcifications. Electronically Signed   By: Lovey Newcomer M.D.   On: 03/21/2022 09:54    Cardiac Studies    Echo 03/20/22 1. Left ventricular ejection fraction, by estimation, is 60 to 65%. The  left ventricle has normal function. The left ventricle has no regional  wall motion abnormalities. There is mild concentric left ventricular  hypertrophy. Left ventricular diastolic  parameters are consistent with Grade I diastolic dysfunction (impaired  relaxation).   2. Right ventricular systolic function is normal. The right ventricular  size is normal. There is normal pulmonary artery systolic pressure.   3. The mitral valve is normal in structure. Trivial mitral valve  regurgitation. No evidence of mitral stenosis.   4. The aortic valve is tricuspid. Aortic valve regurgitation is not  visualized. No aortic stenosis is present.   5. The inferior vena cava is normal in size with greater than 50%  respiratory variability, suggesting right atrial pressure of 3 mmHg.   LEFT HEART CATH AND CORONARY ANGIOGRAPHY  03/20/22   Conclusion      Prox LAD lesion is 90% stenosed.   1st Mrg lesion is 25% stenosed.   Ost Cx to Prox Cx lesion is 75% stenosed.   The left ventricular systolic function is normal.   LV end diastolic pressure is normal.   The left ventricular ejection fraction is 55-65% by visual estimate.   There is no aortic valve stenosis.   Left main equivalent disease with proximal LAD  and ostial circumflex disease.    Will obtain cardiac surgery consult.  Restart IV heparin 2 hours after TR band removal.   Diagnostic Dominance: Right   Patient Profile     61 y.o. male  with hx of seizures, GERD, hiatal hernia  and family hx of CAD who is being seen for the evaluation of chest pressure and elevated troponin.  Assessment & Plan    NSTEMI/CAD - On heparin - surgery Wednesday with Dr Lavonna Monarch   2. HLD - 03/20/2022: Cholesterol 219; HDL 82; LDL Cholesterol 129; Triglycerides 41; VLDL 8  -LDL goal less than 70 - Had Myalgias on Simvastatin. Currently tolerating Crestor '5mg'$  >> will increase to '40mg'$  qd. Patient is agree. He is let us know if similar symptoms.  -Consider OP f/u labs 6-8 weeks given statin initiation this admission.   3. Seizure -Continue  phenytoin non recently   For questions or updates, please contact [place] Please consult [email or web address] for contact info under        Signed, Jenkins Rouge, MD  03/23/2022, 8:26 AM

## 2022-03-23 NOTE — Progress Notes (Signed)
ANTICOAGULATION CONSULT NOTE - Follow Up Consult  Pharmacy Consult for heparin Indication:  CAD awaiting possible CABG  Labs: Recent Labs    03/21/22 0510 03/22/22 0605 03/23/22 0154  HGB 14.5 15.8 15.0  HCT 40.6 43.9 40.6  PLT 159 168 152  APTT  --  106*  --   LABPROT  --  13.4  --   INR  --  1.0  --   HEPARINUNFRC 0.42 0.68 0.37  CREATININE 1.08 0.86 1.06     Assessment  61yo male on heparin after resuming post-cath. Patient has planned CABG for 9/4. Current infusion rate 1200 units/hr with heparin level therapeutic at 0.37. CBC stable and spoke with the patient and reported no signs/symptoms of bleeding and no issues with the infusion.  Plan -Continue heparin 1200 units/hr -Monitor CBC and heparin level daily -Monitor for signs/symptoms of bleeding  Sandford Craze, PharmD. [facility] PGY-1 03/23/2022 8:16 AM

## 2022-03-24 DIAGNOSIS — I214 Non-ST elevation (NSTEMI) myocardial infarction: Secondary | ICD-10-CM | POA: Diagnosis not present

## 2022-03-24 LAB — CBC
HCT: 41.1 % (ref 39.0–52.0)
Hemoglobin: 15 g/dL (ref 13.0–17.0)
MCH: 31.6 pg (ref 26.0–34.0)
MCHC: 36.5 g/dL — ABNORMAL HIGH (ref 30.0–36.0)
MCV: 86.7 fL (ref 80.0–100.0)
Platelets: 160 10*3/uL (ref 150–400)
RBC: 4.74 MIL/uL (ref 4.22–5.81)
RDW: 11.9 % (ref 11.5–15.5)
WBC: 5.9 10*3/uL (ref 4.0–10.5)
nRBC: 0 % (ref 0.0–0.2)

## 2022-03-24 LAB — TYPE AND SCREEN
ABO/RH(D): O POS
Antibody Screen: NEGATIVE

## 2022-03-24 LAB — BASIC METABOLIC PANEL
Anion gap: 8 (ref 5–15)
BUN: 13 mg/dL (ref 8–23)
CO2: 26 mmol/L (ref 22–32)
Calcium: 8.9 mg/dL (ref 8.9–10.3)
Chloride: 107 mmol/L (ref 98–111)
Creatinine, Ser: 0.87 mg/dL (ref 0.61–1.24)
GFR, Estimated: >60 mL/min (ref >60)
Glucose, Bld: 97 mg/dL (ref 70–99)
Potassium: 3.7 mmol/L (ref 3.5–5.1)
Sodium: 141 mmol/L (ref 135–145)

## 2022-03-24 LAB — HEPARIN LEVEL (UNFRACTIONATED): Heparin Unfractionated: 0.43 IU/mL (ref 0.30–0.70)

## 2022-03-24 NOTE — Progress Notes (Signed)
Rounding Note    Patient Name: Wayne Vang Date of Encounter: 03/24/2022  [place] Cardiologist: Donato Heinz, MD   Subjective  No angina ambulating in hall on heparin  Stable awaiting CABG  Inpatient Medications    Scheduled Meds:  aspirin EC  81 mg Oral Daily   [START ON 03/25/2022] bisacodyl  5 mg Oral Once   [START ON 03/25/2022] chlorhexidine  60 mL Topical Once   And   [START ON 03/26/2022] chlorhexidine  60 mL Topical Once   [START ON 03/26/2022] chlorhexidine  15 mL Mouth/Throat Once   [START ON 03/26/2022] metoprolol tartrate  12.5 mg Oral Once   pantoprazole  40 mg Oral Daily   phenytoin  200 mg Oral q morning   phenytoin  300 mg Oral q1800   rosuvastatin  40 mg Oral Daily   sodium chloride flush  3 mL Intravenous Q12H   sodium chloride flush  3 mL Intravenous Q12H   Continuous Infusions:  sodium chloride     heparin 1,200 Units/hr (03/24/22 0708)   PRN Meds: sodium chloride, acetaminophen, nitroGLYCERIN, ondansetron (ZOFRAN) IV, polyvinyl alcohol, sodium chloride flush, [START ON 03/25/2022] temazepam   Vital Signs    Vitals:   03/22/22 2007 03/23/22 1350 03/23/22 2116 03/24/22 0509  BP: 125/85  119/77 121/76  Pulse: 73 81 77 60  Resp: '18 16 17 18  '$ Temp: 97.9 F (36.6 C) 98.4 F (36.9 C) 98.2 F (36.8 C) 97.6 F (36.4 C)  TempSrc: Oral Axillary Oral Oral  SpO2: 94% 95% 98% 99%  Weight:      Height:        Intake/Output Summary (Last 24 hours) at 03/24/2022 0926 Last data filed at 03/23/2022 2125 Gross per 24 hour  Intake 243 ml  Output --  Net 243 ml       03/19/2022   10:00 PM 03/19/2022    3:46 PM 08/20/2021    7:10 AM  Last 3 Weights  Weight (lbs) 220 lb 6.4 oz 235 lb 236 lb  Weight (kg) 99.973 kg 106.595 kg 107.049 kg      Telemetry    NSR - Personally Reviewed  ECG    N/A - Personally Reviewed  Physical Exam  Affect appropriate Healthy:  appears stated age HEENT: normal Neck supple with no adenopathy JVP  normal no bruits no thyromegaly Lungs clear with no wheezing and good diaphragmatic motion Heart:  S1/S2 no murmur, no rub, gallop or click PMI normal Abdomen: benighn, BS positve, no tenderness, no AAA no bruit.  No HSM or HJR Distal pulses intact with no bruits No edema Neuro non-focal Skin warm and dry No muscular weakness   Labs    High Sensitivity Troponin:   Recent Labs  Lab 03/19/22 1707 03/19/22 1855 03/19/22 2250 03/20/22 0010  TROPONINIHS 42* 52* 34* 37*     Chemistry Recent Labs  Lab 03/19/22 1707 03/20/22 0010 03/22/22 0605 03/23/22 0154 03/24/22 0151  NA 141   < > 140 138 141  K 3.9   < > 3.8 3.8 3.7  CL 101   < > 106 104 107  CO2 26   < > '27 24 26  '$ GLUCOSE 95   < > 90 102* 97  BUN 12   < > '11 14 13  '$ CREATININE 1.00   < > 0.86 1.06 0.87  CALCIUM 9.6   < > 9.1 8.8* 8.9  PROT 6.7  --   --   --   --  ALBUMIN 4.6  --   --   --   --   AST 27  --   --   --   --   ALT 20  --   --   --   --   ALKPHOS 72  --   --   --   --   BILITOT 0.7  --   --   --   --   GFRNONAA >60   < > >60 >60 >60  ANIONGAP 14   < > '7 10 8   '$ < > = values in this interval not displayed.    Lipids  Recent Labs  Lab 03/20/22 0010  CHOL 219*  TRIG 41  HDL 82  LDLCALC 129*  CHOLHDL 2.7    Hematology Recent Labs  Lab 03/22/22 0605 03/23/22 0154 03/24/22 0151  WBC 4.6 5.8 5.9  RBC 4.99 4.69 4.74  HGB 15.8 15.0 15.0  HCT 43.9 40.6 41.1  MCV 88.0 86.6 86.7  MCH 31.7 32.0 31.6  MCHC 36.0 36.9* 36.5*  RDW 11.9 11.8 11.9  PLT 168 152 160   Thyroid  Recent Labs  Lab 03/19/22 2250  TSH 1.321    BNPNo results for input(s): "BNP", "PROBNP" in the last 168 hours.  DDimer No results for input(s): "DDIMER" in the last 168 hours.   Radiology    No results found.  Cardiac Studies    Echo 03/20/22 1. Left ventricular ejection fraction, by estimation, is 60 to 65%. The  left ventricle has normal function. The left ventricle has no regional  wall motion abnormalities.  There is mild concentric left ventricular  hypertrophy. Left ventricular diastolic  parameters are consistent with Grade I diastolic dysfunction (impaired  relaxation).   2. Right ventricular systolic function is normal. The right ventricular  size is normal. There is normal pulmonary artery systolic pressure.   3. The mitral valve is normal in structure. Trivial mitral valve  regurgitation. No evidence of mitral stenosis.   4. The aortic valve is tricuspid. Aortic valve regurgitation is not  visualized. No aortic stenosis is present.   5. The inferior vena cava is normal in size with greater than 50%  respiratory variability, suggesting right atrial pressure of 3 mmHg.   LEFT HEART CATH AND CORONARY ANGIOGRAPHY  03/20/22   Conclusion      Prox LAD lesion is 90% stenosed.   1st Mrg lesion is 25% stenosed.   Ost Cx to Prox Cx lesion is 75% stenosed.   The left ventricular systolic function is normal.   LV end diastolic pressure is normal.   The left ventricular ejection fraction is 55-65% by visual estimate.   There is no aortic valve stenosis.   Left main equivalent disease with proximal LAD and ostial circumflex disease.    Will obtain cardiac surgery consult.  Restart IV heparin 2 hours after TR band removal.   Diagnostic Dominance: Right   Patient Profile     61 y.o. male  with hx of seizures, GERD, hiatal hernia  and family hx of CAD who is being seen for the evaluation of chest pressure and elevated troponin.  Assessment & Plan    NSTEMI/CAD - On heparin - surgery Wednesday with Dr Lavonna Monarch   2. HLD - 03/20/2022: Cholesterol 219; HDL 82; LDL Cholesterol 129; Triglycerides 41; VLDL 8  -LDL goal less than 70 - Had Myalgias on Simvastatin. Currently tolerating Crestor '5mg'$  >> will increase to '40mg'$  qd. Patient is agree. He is  let us know if similar symptoms.  -Consider OP f/u labs 6-8 weeks given statin initiation this admission.   3. Seizure -Continue phenytoin non  recently   For questions or updates, please contact [place] Please consult [email or web address] for contact info under        Signed, Jenkins Rouge, MD  03/24/2022, 9:26 AM

## 2022-03-24 NOTE — TOC Progression Note (Signed)
Transition of Care [facility]) - Progression Note    Patient Details  Name: Wayne Vang MRN: [id number] Date of Birth: 09/18/1960  Transition of Care [facility]) CM/SW Contact  Tom-Johnson, Renea Ee, RN Phone Number: 03/24/2022, 9:22 AM  Clinical Narrative:     Patient had Left Heart Cath and Coronary Angiography on 03/20/22. Currently on Heparin drip. Has planned CABG on 03/26/22 with Dr. Lavonna Monarch. No TOC needs noted at this time. CM will continue to follow as patient progresses with care.        Expected Discharge Plan and Services                                                 Social Determinants of Health (SDOH) Interventions    Readmission Risk Interventions     No data to display

## 2022-03-24 NOTE — Progress Notes (Signed)
ANTICOAGULATION CONSULT NOTE - Follow Up Consult  Pharmacy Consult for heparin Indication:  CAD awaiting CABG  Labs: Recent Labs    03/22/22 0605 03/23/22 0154 03/24/22 0151  HGB 15.8 15.0 15.0  HCT 43.9 40.6 41.1  PLT 168 152 160  APTT 106*  --   --   LABPROT 13.4  --   --   INR 1.0  --   --   HEPARINUNFRC 0.68 0.37 0.43  CREATININE 0.86 1.06 0.87     Assessment  61yo male on heparin after resuming post-cath. Patient has planned CABG for 9/6 with Dr. Tommi Rumps. Current infusion rate 1200 units/hr with heparin level therapeutic at 0.43. CBC stable and spoke with the patient and reported no signs/symptoms of bleeding, but did notice some blood in his stool that was minimal. Patient did not go to the bathroom all day and had to strain to go. Nursing aware and will continue monitor since they did not see it. No issues with the infusion.  Plan -Continue heparin 1200 units/hr -Monitor CBC and heparin level daily -Monitor for signs/symptoms of bleeding  Sandford Craze, PharmD. [facility] PGY-1 03/24/2022 7:18 AM

## 2022-03-25 ENCOUNTER — Encounter (HOSPITAL_COMMUNITY): Payer: Self-pay | Admitting: Cardiology

## 2022-03-25 DIAGNOSIS — I214 Non-ST elevation (NSTEMI) myocardial infarction: Secondary | ICD-10-CM | POA: Diagnosis not present

## 2022-03-25 LAB — BASIC METABOLIC PANEL
Anion gap: 7 (ref 5–15)
BUN: 13 mg/dL (ref 8–23)
CO2: 29 mmol/L (ref 22–32)
Calcium: 9.4 mg/dL (ref 8.9–10.3)
Chloride: 105 mmol/L (ref 98–111)
Creatinine, Ser: 0.85 mg/dL (ref 0.61–1.24)
GFR, Estimated: >60 mL/min (ref >60)
Glucose, Bld: 94 mg/dL (ref 70–99)
Potassium: 3.8 mmol/L (ref 3.5–5.1)
Sodium: 141 mmol/L (ref 135–145)

## 2022-03-25 LAB — HEPARIN LEVEL (UNFRACTIONATED): Heparin Unfractionated: 0.6 IU/mL (ref 0.30–0.70)

## 2022-03-25 LAB — SARS CORONAVIRUS 2 BY RT PCR: SARS Coronavirus 2 by RT PCR: NEGATIVE

## 2022-03-25 LAB — CBC
HCT: 43.3 % (ref 39.0–52.0)
Hemoglobin: 16.2 g/dL (ref 13.0–17.0)
MCH: 32.3 pg (ref 26.0–34.0)
MCHC: 37.4 g/dL — ABNORMAL HIGH (ref 30.0–36.0)
MCV: 86.3 fL (ref 80.0–100.0)
Platelets: 171 10*3/uL (ref 150–400)
RBC: 5.02 MIL/uL (ref 4.22–5.81)
RDW: 11.9 % (ref 11.5–15.5)
WBC: 5.6 10*3/uL (ref 4.0–10.5)
nRBC: 0 % (ref 0.0–0.2)

## 2022-03-25 LAB — BLOOD GAS, ARTERIAL
Acid-Base Excess: 4.4 mmol/L — ABNORMAL HIGH (ref 0.0–2.0)
Bicarbonate: 29.2 mmol/L — ABNORMAL HIGH (ref 20.0–28.0)
Drawn by: 59156
O2 Saturation: 98.1 %
Patient temperature: 36.8
pCO2 arterial: 43 mmHg (ref 32–48)
pH, Arterial: 7.44 (ref 7.35–7.45)
pO2, Arterial: 84 mmHg (ref 83–108)

## 2022-03-25 MED ORDER — PLASMA-LYTE A IV SOLN
INTRAVENOUS | Status: DC
  Filled 2022-03-25: qty 2.5

## 2022-03-25 MED ORDER — HEPARIN 30,000 UNITS/1000 ML (OHS) CELLSAVER SOLUTION
Status: DC
  Filled 2022-03-25: qty 1000

## 2022-03-25 MED ORDER — TRANEXAMIC ACID (OHS) BOLUS VIA INFUSION
15.0000 mg/kg | INTRAVENOUS | Status: AC
  Administered 2022-03-26: 1500 mg via INTRAVENOUS
  Filled 2022-03-25 (×2): qty 1500

## 2022-03-25 MED ORDER — CEFAZOLIN SODIUM-DEXTROSE 2-4 GM/100ML-% IV SOLN
2.0000 g | INTRAVENOUS | Status: AC
  Administered 2022-03-26: 2 g via INTRAVENOUS
  Filled 2022-03-25: qty 100

## 2022-03-25 MED ORDER — VANCOMYCIN HCL 1500 MG/300ML IV SOLN
1500.0000 mg | Freq: Once | INTRAVENOUS | Status: AC
Start: 2022-03-26 — End: 2022-03-26
  Administered 2022-03-26: 1500 mg via INTRAVENOUS
  Filled 2022-03-25: qty 300

## 2022-03-25 MED ORDER — DEXMEDETOMIDINE HCL IN NACL 400 MCG/100ML IV SOLN
0.1000 ug/kg/h | INTRAVENOUS | Status: AC
  Administered 2022-03-26: .3 ug/kg/h via INTRAVENOUS
  Filled 2022-03-25: qty 100

## 2022-03-25 MED ORDER — TRANEXAMIC ACID 1000 MG/10ML IV SOLN
1.5000 mg/kg/h | INTRAVENOUS | Status: AC
  Administered 2022-03-26: 1.5 mg/kg/h via INTRAVENOUS
  Filled 2022-03-25: qty 25

## 2022-03-25 MED ORDER — NOREPINEPHRINE 4 MG/250ML-% IV SOLN
0.0000 ug/min | INTRAVENOUS | Status: DC
  Filled 2022-03-25: qty 250

## 2022-03-25 MED ORDER — MILRINONE LACTATE IN DEXTROSE 20-5 MG/100ML-% IV SOLN
0.3000 ug/kg/min | INTRAVENOUS | Status: DC
  Filled 2022-03-25: qty 100

## 2022-03-25 MED ORDER — NITROGLYCERIN IN D5W 200-5 MCG/ML-% IV SOLN
2.0000 ug/min | INTRAVENOUS | Status: AC
  Administered 2022-03-26: 10 ug/min via INTRAVENOUS
  Filled 2022-03-25: qty 250

## 2022-03-25 MED ORDER — INSULIN REGULAR(HUMAN) IN NACL 100-0.9 UT/100ML-% IV SOLN
INTRAVENOUS | Status: AC
  Administered 2022-03-26: .9 [IU]/h via INTRAVENOUS
  Filled 2022-03-25: qty 100

## 2022-03-25 MED ORDER — PHENYLEPHRINE HCL-NACL 20-0.9 MG/250ML-% IV SOLN
30.0000 ug/min | INTRAVENOUS | Status: AC
  Administered 2022-03-26: 25 ug/min via INTRAVENOUS
  Filled 2022-03-25: qty 250

## 2022-03-25 MED ORDER — EPINEPHRINE HCL 5 MG/250ML IV SOLN IN NS
0.0000 ug/min | INTRAVENOUS | Status: DC
  Filled 2022-03-25: qty 250

## 2022-03-25 MED ORDER — POTASSIUM CHLORIDE 2 MEQ/ML IV SOLN
80.0000 meq | INTRAVENOUS | Status: DC
  Filled 2022-03-25: qty 40

## 2022-03-25 MED ORDER — MAGNESIUM SULFATE 50 % IJ SOLN
40.0000 meq | INTRAMUSCULAR | Status: DC
  Filled 2022-03-25: qty 9.85

## 2022-03-25 MED ORDER — TRANEXAMIC ACID (OHS) PUMP PRIME SOLUTION
2.0000 mg/kg | INTRAVENOUS | Status: DC
  Filled 2022-03-25: qty 2

## 2022-03-25 NOTE — Progress Notes (Signed)
Mobility Specialist Progress Note:   03/25/22 0908  Mobility  Activity Ambulated with assistance in hallway  Level of Assistance Independent  Assistive Device None  Distance Ambulated (ft) 550 ft  Activity Response Tolerated well  $Mobility charge 1 Mobility   Pt received in bed willing to participate in mobility. No complaints of pain. Left in bed with call bell in reach and all needs met.   [facility] [place] Mobility Specialist

## 2022-03-25 NOTE — Progress Notes (Signed)
Pt has been stable this past weekend with one episode of GERD. Pt and wife understand plan and wish to proceed tomorrow.

## 2022-03-25 NOTE — Progress Notes (Signed)
CARDIAC REHAB PHASE I    Pt ambulating himself in room and hallways with no SOB or CP. Reviewed several pre-op questions pt had regarding pre [facility] teaching done last week. He is eager to get surgery completed and begin recover process. Will continue to follow.  [phone]  Vanessa Barbara, RN BSN 03/25/2022 9:01 AM

## 2022-03-25 NOTE — Progress Notes (Addendum)
Rounding Note    Patient Name: Wayne Vang Date of Encounter: 03/25/2022  [place] Cardiologist: Donato Heinz, MD   Subjective   No chest pain since heparin drip started.  Inpatient Medications    Scheduled Meds:  aspirin EC  81 mg Oral Daily   bisacodyl  5 mg Oral Once   chlorhexidine  60 mL Topical Once   And   [START ON 03/26/2022] chlorhexidine  60 mL Topical Once   [START ON 03/26/2022] chlorhexidine  15 mL Mouth/Throat Once   [START ON 03/26/2022] metoprolol tartrate  12.5 mg Oral Once   pantoprazole  40 mg Oral Daily   phenytoin  200 mg Oral q morning   phenytoin  300 mg Oral q1800   rosuvastatin  40 mg Oral Daily   sodium chloride flush  3 mL Intravenous Q12H   sodium chloride flush  3 mL Intravenous Q12H   Continuous Infusions:  sodium chloride     heparin 1,200 Units/hr (03/25/22 0449)   PRN Meds: sodium chloride, acetaminophen, nitroGLYCERIN, ondansetron (ZOFRAN) IV, polyvinyl alcohol, sodium chloride flush, temazepam   Vital Signs    Vitals:   03/24/22 0509 03/24/22 1401 03/24/22 2027 03/25/22 0706  BP: 121/76 126/81 118/80 123/86  Pulse: 60 93 79 63  Resp: '18 18 18 20  '$ Temp: 97.6 F (36.4 C) 97.9 F (36.6 C) 98 F (36.7 C) 98.2 F (36.8 C)  TempSrc: Oral Oral Oral Oral  SpO2: 99%  95% 98%  Weight:      Height:        Intake/Output Summary (Last 24 hours) at 03/25/2022 0728 Last data filed at 03/24/2022 2042 Gross per 24 hour  Intake 1221.73 ml  Output --  Net 1221.73 ml      03/19/2022   10:00 PM 03/19/2022    3:46 PM 08/20/2021    7:10 AM  Last 3 Weights  Weight (lbs) 220 lb 6.4 oz 235 lb 236 lb  Weight (kg) 99.973 kg 106.595 kg 107.049 kg      Telemetry    Sinus rhythm with HR 60s-70s - Personally Reviewed  ECG    No new tracings - Personally Reviewed  Physical Exam   GEN: No acute distress.   Neck: No JVD Cardiac: RRR, no murmurs, rubs, or gallops.  Respiratory: Clear to auscultation bilaterally. GI:  Soft, nontender, non-distended  MS: No edema; No deformity. Neuro:  Nonfocal  Psych: Normal affect  Right radial cath site C/D/I  Labs    High Sensitivity Troponin:   Recent Labs  Lab 03/19/22 1707 03/19/22 1855 03/19/22 2250 03/20/22 0010  TROPONINIHS 42* 52* 34* 37*     Chemistry Recent Labs  Lab 03/19/22 1707 03/20/22 0010 03/23/22 0154 03/24/22 0151 03/25/22 0337  NA 141   < > 138 141 141  K 3.9   < > 3.8 3.7 3.8  CL 101   < > 104 107 105  CO2 26   < > '24 26 29  '$ GLUCOSE 95   < > 102* 97 94  BUN 12   < > '14 13 13  '$ CREATININE 1.00   < > 1.06 0.87 0.85  CALCIUM 9.6   < > 8.8* 8.9 9.4  PROT 6.7  --   --   --   --   ALBUMIN 4.6  --   --   --   --   AST 27  --   --   --   --   ALT  20  --   --   --   --   ALKPHOS 72  --   --   --   --   BILITOT 0.7  --   --   --   --   GFRNONAA >60   < > >60 >60 >60  ANIONGAP 14   < > '10 8 7   '$ < > = values in this interval not displayed.    Lipids  Recent Labs  Lab 03/20/22 0010  CHOL 219*  TRIG 41  HDL 82  LDLCALC 129*  CHOLHDL 2.7    Hematology Recent Labs  Lab 03/23/22 0154 03/24/22 0151 03/25/22 0337  WBC 5.8 5.9 5.6  RBC 4.69 4.74 5.02  HGB 15.0 15.0 16.2  HCT 40.6 41.1 43.3  MCV 86.6 86.7 86.3  MCH 32.0 31.6 32.3  MCHC 36.9* 36.5* 37.4*  RDW 11.8 11.9 11.9  PLT 152 160 171   Thyroid  Recent Labs  Lab 03/19/22 2250  TSH 1.321    BNPNo results for input(s): "BNP", "PROBNP" in the last 168 hours.  DDimer No results for input(s): "DDIMER" in the last 168 hours.   Radiology    No results found.  Cardiac Studies   Echo 03/20/22 1. Left ventricular ejection fraction, by estimation, is 60 to 65%. The  left ventricle has normal function. The left ventricle has no regional  wall motion abnormalities. There is mild concentric left ventricular  hypertrophy. Left ventricular diastolic  parameters are consistent with Grade I diastolic dysfunction (impaired  relaxation).   2. Right ventricular systolic  function is normal. The right ventricular  size is normal. There is normal pulmonary artery systolic pressure.   3. The mitral valve is normal in structure. Trivial mitral valve  regurgitation. No evidence of mitral stenosis.   4. The aortic valve is tricuspid. Aortic valve regurgitation is not  visualized. No aortic stenosis is present.   5. The inferior vena cava is normal in size with greater than 50%  respiratory variability, suggesting right atrial pressure of 3 mmHg.    LEFT HEART CATH AND CORONARY ANGIOGRAPHY  03/20/22    Conclusion       Prox LAD lesion is 90% stenosed.   1st Mrg lesion is 25% stenosed.   Ost Cx to Prox Cx lesion is 75% stenosed.   The left ventricular systolic function is normal.   LV end diastolic pressure is normal.   The left ventricular ejection fraction is 55-65% by visual estimate.   There is no aortic valve stenosis.   Left main equivalent disease with proximal LAD and ostial circumflex disease.    Will obtain cardiac surgery consult.  Restart IV heparin 2 hours after TR band removal.    Diagnostic Dominance: Right    Patient Profile     61 y.o. male with hx of seizures, GERD, hiatal hernia  and family hx of CAD who is being seen for the evaluation of chest pressure and elevated troponin. Heart catheterization with left main equivalent disease. CT surgery was consulted and he is scheduled for CABG on Wed 03/26/22.  Assessment & Plan    CAD NSTEMI Angiography with left main equivalent disease with proximal LAD stenosis of 90% and ostial LCX disease with 75% stenosis. He remains on heparin gtt in anticipation for CABG tomorrow.  - continue ASA, BB, 40 mg crestor   Hyperlipidemia with LDL goal < 70 On crestor 40 mg 03/20/2022: Cholesterol 219; HDL 82; LDL Cholesterol 129; Triglycerides 41;  VLDL 8 LP (a) 9.1 Hx of myalgias on simvasatin. Was tolerating 5 mg crestor, increased to 40 mg crestor following heart cath. Repeat labs 6-8 weeks after  discharge. May need [id number]   Seizure disorder Continue phenytoin - patient would like 0600 and 1800    For questions or updates, please contact [place] Please consult [email or web address] for contact info under        Signed, Ledora Bottcher, PA  03/25/2022, 7:28 AM    Patient examined chart reviewed Pain free normal exam for CABG in am Continue heparin All questions answered EF preserved EF normal Right radial cath site looks fine  Jenkins Rouge MD [facility]

## 2022-03-25 NOTE — Progress Notes (Signed)
ANTICOAGULATION CONSULT NOTE - Follow Up Consult  Pharmacy Consult for heparin Indication:  CAD awaiting CABG  Labs: Recent Labs    03/23/22 0154 03/24/22 0151 03/25/22 0337  HGB 15.0 15.0 16.2  HCT 40.6 41.1 43.3  PLT 152 160 171  HEPARINUNFRC 0.37 0.43 0.60  CREATININE 1.06 0.87 0.85     Assessment  61yo male on heparin after resuming post-cath. Patient has planned CABG for 9/6  -heparin level at goal, CBC stable  Plan -Continue heparin 1200 units/hr -Monitor CBC and heparin level daily  Hildred Laser, PharmD Clinical Pharmacist **Pharmacist phone directory can now be found on [email or web address] ([email or web address]).  Listed under [place].

## 2022-03-25 NOTE — Anesthesia Preprocedure Evaluation (Addendum)
Anesthesia Evaluation  Patient identified by MRN, date of birth, ID band Patient awake    Reviewed: Allergy & Precautions, NPO status , Patient's Chart, lab work & pertinent test results  Airway Mallampati: I  TM Distance: >3 FB Neck ROM: Full    Dental  (+) Dental Advisory Given, Edentulous Upper, Missing, Poor Dentition, Partial Lower   Pulmonary neg pulmonary ROS, former smoker,    Pulmonary exam normal breath sounds clear to auscultation       Cardiovascular + Past MI   Rhythm:Regular Rate:Normal  Echo 02/2022 1. Left ventricular ejection fraction, by estimation, is 60 to 65%. The left ventricle has normal function. The left ventricle has no regional wall motion abnormalities. There is mild concentric left ventricular hypertrophy. Left ventricular diastolic  parameters are consistent with Grade I diastolic dysfunction (impaired relaxation).  2. Right ventricular systolic function is normal. The right ventricular size is normal. There is normal pulmonary artery systolic pressure.  3. The mitral valve is normal in structure. Trivial mitral valve regurgitation. No evidence of mitral stenosis.  4. The aortic valve is tricuspid. Aortic valve regurgitation is not visualized. No aortic stenosis is present.  5. The inferior vena cava is normal in size with greater than 50% respiratory variability, suggesting right atrial pressure of 3 mmHg.    Neuro/Psych Seizures -,   Neuromuscular disease    GI/Hepatic Neg liver ROS, hiatal hernia, GERD  ,  Endo/Other  negative endocrine ROS  Renal/GU negative Renal ROS     Musculoskeletal negative musculoskeletal ROS (+)   Abdominal   Peds  Hematology negative hematology ROS (+)   Anesthesia Other Findings   Reproductive/Obstetrics                            Anesthesia Physical Anesthesia Plan  ASA: 4  Anesthesia Plan: General   Post-op Pain  Management: Minimal or no pain anticipated   Induction: Intravenous  PONV Risk Score and Plan: 3 and Midazolam and Treatment may vary due to age or medical condition  Airway Management Planned: Oral ETT  Additional Equipment: Arterial line, CVP, TEE and Ultrasound Guidance Line Placement  Intra-op Plan: Utilization Of Total Body Hypothermia per surgeon request  Post-operative Plan: Post-operative intubation/ventilation  Informed Consent: I have reviewed the patients History and Physical, chart, labs and discussed the procedure including the risks, benefits and alternatives for the proposed anesthesia with the patient or authorized representative who has indicated his/her understanding and acceptance.     Dental advisory given  Plan Discussed with: CRNA  Anesthesia Plan Comments:        Anesthesia Quick Evaluation

## 2022-03-26 ENCOUNTER — Inpatient Hospital Stay (HOSPITAL_COMMUNITY): Payer: BC Managed Care – PPO

## 2022-03-26 ENCOUNTER — Encounter (HOSPITAL_COMMUNITY): Payer: Self-pay | Admitting: Thoracic Surgery (Cardiothoracic Vascular Surgery)

## 2022-03-26 ENCOUNTER — Other Ambulatory Visit: Payer: Self-pay

## 2022-03-26 ENCOUNTER — Inpatient Hospital Stay (HOSPITAL_COMMUNITY): Payer: BC Managed Care – PPO | Admitting: Certified Registered Nurse Anesthetist

## 2022-03-26 ENCOUNTER — Inpatient Hospital Stay (HOSPITAL_COMMUNITY)
Admission: EM | Disposition: A | Discharge status: Home / Self Care | Payer: Self-pay | Source: Home / Self Care | Attending: Cardiology

## 2022-03-26 DIAGNOSIS — I214 Non-ST elevation (NSTEMI) myocardial infarction: Secondary | ICD-10-CM | POA: Diagnosis not present

## 2022-03-26 DIAGNOSIS — Z951 Presence of aortocoronary bypass graft: Secondary | ICD-10-CM

## 2022-03-26 DIAGNOSIS — D649 Anemia, unspecified: Secondary | ICD-10-CM

## 2022-03-26 DIAGNOSIS — I2510 Atherosclerotic heart disease of native coronary artery without angina pectoris: Secondary | ICD-10-CM

## 2022-03-26 DIAGNOSIS — Z9911 Dependence on respirator [ventilator] status: Secondary | ICD-10-CM | POA: Diagnosis not present

## 2022-03-26 HISTORY — PX: CORONARY ARTERY BYPASS GRAFT: SHX141

## 2022-03-26 HISTORY — DX: Presence of aortocoronary bypass graft: Z95.1

## 2022-03-26 HISTORY — PX: TEE WITHOUT CARDIOVERSION: SHX5443

## 2022-03-26 LAB — BASIC METABOLIC PANEL
Anion gap: 10 (ref 5–15)
Anion gap: 7 (ref 5–15)
BUN: 13 mg/dL (ref 8–23)
BUN: 10 mg/dL (ref 8–23)
CO2: 26 mmol/L (ref 22–32)
CO2: 24 mmol/L (ref 22–32)
Calcium: 9.4 mg/dL (ref 8.9–10.3)
Calcium: 7.9 mg/dL — ABNORMAL LOW (ref 8.9–10.3)
Chloride: 105 mmol/L (ref 98–111)
Chloride: 106 mmol/L (ref 98–111)
Creatinine, Ser: 0.91 mg/dL (ref 0.61–1.24)
Creatinine, Ser: 0.84 mg/dL (ref 0.61–1.24)
GFR, Estimated: >60 mL/min (ref >60)
GFR, Estimated: >60 mL/min (ref >60)
Glucose, Bld: 97 mg/dL (ref 70–99)
Glucose, Bld: 130 mg/dL — ABNORMAL HIGH (ref 70–99)
Potassium: 4.1 mmol/L (ref 3.5–5.1)
Potassium: 4.3 mmol/L (ref 3.5–5.1)
Sodium: 141 mmol/L (ref 135–145)
Sodium: 137 mmol/L (ref 135–145)

## 2022-03-26 LAB — POCT I-STAT EG7
Acid-Base Excess: 2 mmol/L (ref 0.0–2.0)
Bicarbonate: 27.1 mmol/L (ref 20.0–28.0)
Calcium, Ion: 1.08 mmol/L — ABNORMAL LOW (ref 1.15–1.40)
HCT: 30 % — ABNORMAL LOW (ref 39.0–52.0)
Hemoglobin: 10.2 g/dL — ABNORMAL LOW (ref 13.0–17.0)
O2 Saturation: 79 %
Potassium: 5.4 mmol/L — ABNORMAL HIGH (ref 3.5–5.1)
Sodium: 136 mmol/L (ref 135–145)
TCO2: 28 mmol/L (ref 22–32)
pCO2, Ven: 43.8 mmHg — ABNORMAL LOW (ref 44–60)
pH, Ven: 7.399 (ref 7.25–7.43)
pO2, Ven: 44 mmHg (ref 32–45)

## 2022-03-26 LAB — POCT I-STAT, CHEM 8
BUN: 10 mg/dL (ref 8–23)
BUN: 11 mg/dL (ref 8–23)
BUN: 10 mg/dL (ref 8–23)
BUN: 10 mg/dL (ref 8–23)
Calcium, Ion: 1.19 mmol/L (ref 1.15–1.40)
Calcium, Ion: 1.18 mmol/L (ref 1.15–1.40)
Calcium, Ion: 1.03 mmol/L — ABNORMAL LOW (ref 1.15–1.40)
Calcium, Ion: 1.05 mmol/L — ABNORMAL LOW (ref 1.15–1.40)
Chloride: 103 mmol/L (ref 98–111)
Chloride: 103 mmol/L (ref 98–111)
Chloride: 101 mmol/L (ref 98–111)
Chloride: 102 mmol/L (ref 98–111)
Creatinine, Ser: 0.5 mg/dL — ABNORMAL LOW (ref 0.61–1.24)
Creatinine, Ser: 0.7 mg/dL (ref 0.61–1.24)
Creatinine, Ser: 0.6 mg/dL — ABNORMAL LOW (ref 0.61–1.24)
Creatinine, Ser: 0.6 mg/dL — ABNORMAL LOW (ref 0.61–1.24)
Glucose, Bld: 97 mg/dL (ref 70–99)
Glucose, Bld: 106 mg/dL — ABNORMAL HIGH (ref 70–99)
Glucose, Bld: 101 mg/dL — ABNORMAL HIGH (ref 70–99)
Glucose, Bld: 109 mg/dL — ABNORMAL HIGH (ref 70–99)
HCT: 40 % (ref 39.0–52.0)
HCT: 39 % (ref 39.0–52.0)
HCT: 29 % — ABNORMAL LOW (ref 39.0–52.0)
HCT: 31 % — ABNORMAL LOW (ref 39.0–52.0)
Hemoglobin: 13.6 g/dL (ref 13.0–17.0)
Hemoglobin: 13.3 g/dL (ref 13.0–17.0)
Hemoglobin: 9.9 g/dL — ABNORMAL LOW (ref 13.0–17.0)
Hemoglobin: 10.5 g/dL — ABNORMAL LOW (ref 13.0–17.0)
Potassium: 3.8 mmol/L (ref 3.5–5.1)
Potassium: 4.3 mmol/L (ref 3.5–5.1)
Potassium: 5.1 mmol/L (ref 3.5–5.1)
Potassium: 5.1 mmol/L (ref 3.5–5.1)
Sodium: 139 mmol/L (ref 135–145)
Sodium: 136 mmol/L (ref 135–145)
Sodium: 135 mmol/L (ref 135–145)
Sodium: 136 mmol/L (ref 135–145)
TCO2: 26 mmol/L (ref 22–32)
TCO2: 26 mmol/L (ref 22–32)
TCO2: 26 mmol/L (ref 22–32)
TCO2: 28 mmol/L (ref 22–32)

## 2022-03-26 LAB — GLUCOSE, CAPILLARY
Glucose-Capillary: 124 mg/dL — ABNORMAL HIGH (ref 70–99)
Glucose-Capillary: 139 mg/dL — ABNORMAL HIGH (ref 70–99)
Glucose-Capillary: 140 mg/dL — ABNORMAL HIGH (ref 70–99)
Glucose-Capillary: 131 mg/dL — ABNORMAL HIGH (ref 70–99)
Glucose-Capillary: 127 mg/dL — ABNORMAL HIGH (ref 70–99)
Glucose-Capillary: 145 mg/dL — ABNORMAL HIGH (ref 70–99)
Glucose-Capillary: 154 mg/dL — ABNORMAL HIGH (ref 70–99)
Glucose-Capillary: 156 mg/dL — ABNORMAL HIGH (ref 70–99)
Glucose-Capillary: 169 mg/dL — ABNORMAL HIGH (ref 70–99)

## 2022-03-26 LAB — POCT I-STAT 7, (LYTES, BLD GAS, ICA,H+H)
Acid-Base Excess: 1 mmol/L (ref 0.0–2.0)
Acid-Base Excess: 2 mmol/L (ref 0.0–2.0)
Acid-Base Excess: 3 mmol/L — ABNORMAL HIGH (ref 0.0–2.0)
Acid-Base Excess: 1 mmol/L (ref 0.0–2.0)
Acid-Base Excess: 3 mmol/L — ABNORMAL HIGH (ref 0.0–2.0)
Acid-Base Excess: 0 mmol/L (ref 0.0–2.0)
Acid-base deficit: 3 mmol/L — ABNORMAL HIGH (ref 0.0–2.0)
Acid-base deficit: 2 mmol/L (ref 0.0–2.0)
Acid-base deficit: 3 mmol/L — ABNORMAL HIGH (ref 0.0–2.0)
Bicarbonate: 27.7 mmol/L (ref 20.0–28.0)
Bicarbonate: 26.5 mmol/L (ref 20.0–28.0)
Bicarbonate: 28.1 mmol/L — ABNORMAL HIGH (ref 20.0–28.0)
Bicarbonate: 25.8 mmol/L (ref 20.0–28.0)
Bicarbonate: 27.7 mmol/L (ref 20.0–28.0)
Bicarbonate: 24.6 mmol/L (ref 20.0–28.0)
Bicarbonate: 23.5 mmol/L (ref 20.0–28.0)
Bicarbonate: 24 mmol/L (ref 20.0–28.0)
Bicarbonate: 24 mmol/L (ref 20.0–28.0)
Calcium, Ion: 1.21 mmol/L (ref 1.15–1.40)
Calcium, Ion: 1.03 mmol/L — ABNORMAL LOW (ref 1.15–1.40)
Calcium, Ion: 1.04 mmol/L — ABNORMAL LOW (ref 1.15–1.40)
Calcium, Ion: 1.05 mmol/L — ABNORMAL LOW (ref 1.15–1.40)
Calcium, Ion: 1.06 mmol/L — ABNORMAL LOW (ref 1.15–1.40)
Calcium, Ion: 1.12 mmol/L — ABNORMAL LOW (ref 1.15–1.40)
Calcium, Ion: 1.14 mmol/L — ABNORMAL LOW (ref 1.15–1.40)
Calcium, Ion: 1.14 mmol/L — ABNORMAL LOW (ref 1.15–1.40)
Calcium, Ion: 1.18 mmol/L (ref 1.15–1.40)
HCT: 39 % (ref 39.0–52.0)
HCT: 30 % — ABNORMAL LOW (ref 39.0–52.0)
HCT: 31 % — ABNORMAL LOW (ref 39.0–52.0)
HCT: 30 % — ABNORMAL LOW (ref 39.0–52.0)
HCT: 32 % — ABNORMAL LOW (ref 39.0–52.0)
HCT: 34 % — ABNORMAL LOW (ref 39.0–52.0)
HCT: 34 % — ABNORMAL LOW (ref 39.0–52.0)
HCT: 34 % — ABNORMAL LOW (ref 39.0–52.0)
HCT: 35 % — ABNORMAL LOW (ref 39.0–52.0)
Hemoglobin: 13.3 g/dL (ref 13.0–17.0)
Hemoglobin: 10.2 g/dL — ABNORMAL LOW (ref 13.0–17.0)
Hemoglobin: 10.5 g/dL — ABNORMAL LOW (ref 13.0–17.0)
Hemoglobin: 10.2 g/dL — ABNORMAL LOW (ref 13.0–17.0)
Hemoglobin: 10.9 g/dL — ABNORMAL LOW (ref 13.0–17.0)
Hemoglobin: 11.6 g/dL — ABNORMAL LOW (ref 13.0–17.0)
Hemoglobin: 11.6 g/dL — ABNORMAL LOW (ref 13.0–17.0)
Hemoglobin: 11.6 g/dL — ABNORMAL LOW (ref 13.0–17.0)
Hemoglobin: 11.9 g/dL — ABNORMAL LOW (ref 13.0–17.0)
O2 Saturation: 100 %
O2 Saturation: 100 %
O2 Saturation: 100 %
O2 Saturation: 100 %
O2 Saturation: 99 %
O2 Saturation: 96 %
O2 Saturation: 97 %
O2 Saturation: 98 %
O2 Saturation: 96 %
Patient temperature: 36.3
Patient temperature: 38
Patient temperature: 38
Patient temperature: 37.9
Potassium: 3.8 mmol/L (ref 3.5–5.1)
Potassium: 5 mmol/L (ref 3.5–5.1)
Potassium: 5.1 mmol/L (ref 3.5–5.1)
Potassium: 5.2 mmol/L — ABNORMAL HIGH (ref 3.5–5.1)
Potassium: 5.9 mmol/L — ABNORMAL HIGH (ref 3.5–5.1)
Potassium: 4.1 mmol/L (ref 3.5–5.1)
Potassium: 3.9 mmol/L (ref 3.5–5.1)
Potassium: 4.1 mmol/L (ref 3.5–5.1)
Potassium: 4.2 mmol/L (ref 3.5–5.1)
Sodium: 140 mmol/L (ref 135–145)
Sodium: 135 mmol/L (ref 135–145)
Sodium: 136 mmol/L (ref 135–145)
Sodium: 135 mmol/L (ref 135–145)
Sodium: 136 mmol/L (ref 135–145)
Sodium: 137 mmol/L (ref 135–145)
Sodium: 136 mmol/L (ref 135–145)
Sodium: 138 mmol/L (ref 135–145)
Sodium: 136 mmol/L (ref 135–145)
TCO2: 29 mmol/L (ref 22–32)
TCO2: 28 mmol/L (ref 22–32)
TCO2: 29 mmol/L (ref 22–32)
TCO2: 27 mmol/L (ref 22–32)
TCO2: 29 mmol/L (ref 22–32)
TCO2: 26 mmol/L (ref 22–32)
TCO2: 25 mmol/L (ref 22–32)
TCO2: 25 mmol/L (ref 22–32)
TCO2: 25 mmol/L (ref 22–32)
pCO2 arterial: 52 mmHg — ABNORMAL HIGH (ref 32–48)
pCO2 arterial: 40.9 mmHg (ref 32–48)
pCO2 arterial: 42.2 mmHg (ref 32–48)
pCO2 arterial: 40.6 mmHg (ref 32–48)
pCO2 arterial: 44.5 mmHg (ref 32–48)
pCO2 arterial: 40 mmHg (ref 32–48)
pCO2 arterial: 49.1 mmHg — ABNORMAL HIGH (ref 32–48)
pCO2 arterial: 47.7 mmHg (ref 32–48)
pCO2 arterial: 49.8 mmHg — ABNORMAL HIGH (ref 32–48)
pH, Arterial: 7.334 — ABNORMAL LOW (ref 7.35–7.45)
pH, Arterial: 7.42 (ref 7.35–7.45)
pH, Arterial: 7.432 (ref 7.35–7.45)
pH, Arterial: 7.402 (ref 7.35–7.45)
pH, Arterial: 7.412 (ref 7.35–7.45)
pH, Arterial: 7.395 (ref 7.35–7.45)
pH, Arterial: 7.293 — ABNORMAL LOW (ref 7.35–7.45)
pH, Arterial: 7.315 — ABNORMAL LOW (ref 7.35–7.45)
pH, Arterial: 7.295 — ABNORMAL LOW (ref 7.35–7.45)
pO2, Arterial: 284 mmHg — ABNORMAL HIGH (ref 83–108)
pO2, Arterial: 319 mmHg — ABNORMAL HIGH (ref 83–108)
pO2, Arterial: 368 mmHg — ABNORMAL HIGH (ref 83–108)
pO2, Arterial: 153 mmHg — ABNORMAL HIGH (ref 83–108)
pO2, Arterial: 231 mmHg — ABNORMAL HIGH (ref 83–108)
pO2, Arterial: 77 mmHg — ABNORMAL LOW (ref 83–108)
pO2, Arterial: 107 mmHg (ref 83–108)
pO2, Arterial: 114 mmHg — ABNORMAL HIGH (ref 83–108)
pO2, Arterial: 93 mmHg (ref 83–108)

## 2022-03-26 LAB — CBC
HCT: 44.3 % (ref 39.0–52.0)
HCT: 34.7 % — ABNORMAL LOW (ref 39.0–52.0)
HCT: 35.3 % — ABNORMAL LOW (ref 39.0–52.0)
Hemoglobin: 15.9 g/dL (ref 13.0–17.0)
Hemoglobin: 12.6 g/dL — ABNORMAL LOW (ref 13.0–17.0)
Hemoglobin: 13 g/dL (ref 13.0–17.0)
MCH: 31.5 pg (ref 26.0–34.0)
MCH: 31.6 pg (ref 26.0–34.0)
MCH: 32 pg (ref 26.0–34.0)
MCHC: 35.9 g/dL (ref 30.0–36.0)
MCHC: 36.3 g/dL — ABNORMAL HIGH (ref 30.0–36.0)
MCHC: 36.8 g/dL — ABNORMAL HIGH (ref 30.0–36.0)
MCV: 87.7 fL (ref 80.0–100.0)
MCV: 87 fL (ref 80.0–100.0)
MCV: 86.9 fL (ref 80.0–100.0)
Platelets: 173 10*3/uL (ref 150–400)
Platelets: 110 10*3/uL — ABNORMAL LOW (ref 150–400)
Platelets: 138 10*3/uL — ABNORMAL LOW (ref 150–400)
RBC: 5.05 MIL/uL (ref 4.22–5.81)
RBC: 3.99 MIL/uL — ABNORMAL LOW (ref 4.22–5.81)
RBC: 4.06 MIL/uL — ABNORMAL LOW (ref 4.22–5.81)
RDW: 12 % (ref 11.5–15.5)
RDW: 11.9 % (ref 11.5–15.5)
RDW: 12 % (ref 11.5–15.5)
WBC: 6 10*3/uL (ref 4.0–10.5)
WBC: 10.5 10*3/uL (ref 4.0–10.5)
WBC: 13.3 10*3/uL — ABNORMAL HIGH (ref 4.0–10.5)
nRBC: 0 % (ref 0.0–0.2)
nRBC: 0 % (ref 0.0–0.2)
nRBC: 0 % (ref 0.0–0.2)

## 2022-03-26 LAB — ECHO INTRAOPERATIVE TEE
AV Mean grad: 3 mmHg
AV Peak grad: 5.9 mmHg
Ao pk vel: 1.21 m/s
Height: 74 in
Weight: 3526.4 oz

## 2022-03-26 LAB — HEMOGLOBIN AND HEMATOCRIT, BLOOD
HCT: 29.2 % — ABNORMAL LOW (ref 39.0–52.0)
Hemoglobin: 11 g/dL — ABNORMAL LOW (ref 13.0–17.0)

## 2022-03-26 LAB — PROTIME-INR
INR: 1.5 — ABNORMAL HIGH (ref 0.8–1.2)
Prothrombin Time: 17.6 seconds — ABNORMAL HIGH (ref 11.4–15.2)

## 2022-03-26 LAB — HEPARIN LEVEL (UNFRACTIONATED): Heparin Unfractionated: 0.45 IU/mL (ref 0.30–0.70)

## 2022-03-26 LAB — APTT: aPTT: 26 seconds (ref 24–36)

## 2022-03-26 LAB — PLATELET COUNT: Platelets: 135 10*3/uL — ABNORMAL LOW (ref 150–400)

## 2022-03-26 LAB — MAGNESIUM: Magnesium: 2.8 mg/dL — ABNORMAL HIGH (ref 1.7–2.4)

## 2022-03-26 SURGERY — CORONARY ARTERY BYPASS GRAFTING (CABG)
Anesthesia: General | Site: Chest

## 2022-03-26 MED ORDER — MORPHINE SULFATE (PF) 2 MG/ML IV SOLN
1.0000 mg | INTRAVENOUS | Status: DC | PRN
  Administered 2022-03-26 (×2): 2 mg via INTRAVENOUS
  Administered 2022-03-26: 4 mg via INTRAVENOUS
  Administered 2022-03-26 (×2): 2 mg via INTRAVENOUS
  Filled 2022-03-26 (×2): qty 1
  Filled 2022-03-26 (×2): qty 2

## 2022-03-26 MED ORDER — SODIUM CHLORIDE 0.9 % IV SOLN
200.0000 mg | Freq: Every morning | INTRAVENOUS | Status: DC
  Filled 2022-03-26: qty 4

## 2022-03-26 MED ORDER — VASOPRESSIN 20 UNIT/ML IV SOLN
INTRAVENOUS | Status: AC
  Filled 2022-03-26: qty 1

## 2022-03-26 MED ORDER — SODIUM CHLORIDE 0.9% FLUSH
10.0000 mL | Freq: Two times a day (BID) | INTRAVENOUS | Status: DC
  Administered 2022-03-26 – 2022-03-29 (×6): 10 mL

## 2022-03-26 MED ORDER — ACETAMINOPHEN 500 MG PO TABS
1000.0000 mg | ORAL_TABLET | Freq: Four times a day (QID) | ORAL | Status: DC
  Administered 2022-03-27 – 2022-03-30 (×14): 1000 mg via ORAL
  Filled 2022-03-26 (×13): qty 2

## 2022-03-26 MED ORDER — MIDAZOLAM HCL (PF) 5 MG/ML IJ SOLN
INTRAMUSCULAR | Status: DC | PRN
  Administered 2022-03-26: 2 mg via INTRAVENOUS
  Administered 2022-03-26: 1 mg via INTRAVENOUS
  Administered 2022-03-26: 3 mg via INTRAVENOUS
  Administered 2022-03-26 (×2): 2 mg via INTRAVENOUS

## 2022-03-26 MED ORDER — VANCOMYCIN HCL 1000 MG IV SOLR
INTRAVENOUS | Status: DC
  Filled 2022-03-26: qty 20

## 2022-03-26 MED ORDER — ARTIFICIAL TEARS OPHTHALMIC OINT
TOPICAL_OINTMENT | OPHTHALMIC | Status: DC | PRN
  Administered 2022-03-26: 1 via OPHTHALMIC

## 2022-03-26 MED ORDER — LACTATED RINGERS IV SOLN: INTRAVENOUS | Status: DC

## 2022-03-26 MED ORDER — SODIUM CHLORIDE 0.9 % IV SOLN: INTRAVENOUS | Status: DC | PRN

## 2022-03-26 MED ORDER — HEPARIN SODIUM (PORCINE) 1000 UNIT/ML IJ SOLN
INTRAMUSCULAR | Status: AC
  Filled 2022-03-26: qty 1

## 2022-03-26 MED ORDER — MIDAZOLAM HCL 2 MG/2ML IJ SOLN
2.0000 mg | INTRAMUSCULAR | Status: DC | PRN
  Administered 2022-03-26: 2 mg via INTRAVENOUS
  Filled 2022-03-26: qty 2

## 2022-03-26 MED ORDER — METOPROLOL TARTRATE 25 MG/10 ML ORAL SUSPENSION: 12.5000 mg | Freq: Two times a day (BID) | ORAL | Status: DC

## 2022-03-26 MED ORDER — PROPOFOL 10 MG/ML IV BOLUS
INTRAVENOUS | Status: AC
  Filled 2022-03-26: qty 20

## 2022-03-26 MED ORDER — KETOROLAC TROMETHAMINE 15 MG/ML IJ SOLN
30.0000 mg | Freq: Four times a day (QID) | INTRAMUSCULAR | Status: AC
Start: 2022-03-27 — End: 2022-03-27
  Administered 2022-03-27: 30 mg via INTRAVENOUS
  Filled 2022-03-26: qty 2

## 2022-03-26 MED ORDER — ORAL CARE MOUTH RINSE: 15.0000 mL | OROMUCOSAL | Status: DC | PRN

## 2022-03-26 MED ORDER — POTASSIUM CHLORIDE 10 MEQ/50ML IV SOLN: 10.0000 meq | INTRAVENOUS | Status: AC

## 2022-03-26 MED ORDER — HEPARIN SODIUM (PORCINE) 1000 UNIT/ML IJ SOLN
INTRAMUSCULAR | Status: AC
  Filled 2022-03-26: qty 10

## 2022-03-26 MED ORDER — DOCUSATE SODIUM 100 MG PO CAPS
200.0000 mg | ORAL_CAPSULE | Freq: Every day | ORAL | Status: DC
  Administered 2022-03-27 – 2022-03-29 (×3): 200 mg via ORAL
  Filled 2022-03-26 (×3): qty 2

## 2022-03-26 MED ORDER — FENTANYL CITRATE (PF) 250 MCG/5ML IJ SOLN
INTRAMUSCULAR | Status: AC
  Filled 2022-03-26: qty 5

## 2022-03-26 MED ORDER — NITROGLYCERIN IN D5W 200-5 MCG/ML-% IV SOLN: 0.0000 ug/min | INTRAVENOUS | Status: DC

## 2022-03-26 MED ORDER — PHENYLEPHRINE HCL-NACL 20-0.9 MG/250ML-% IV SOLN: 0.0000 ug/min | INTRAVENOUS | Status: DC

## 2022-03-26 MED ORDER — ROCURONIUM BROMIDE 10 MG/ML (PF) SYRINGE
PREFILLED_SYRINGE | INTRAVENOUS | Status: AC
Start: 2022-03-26 — End: ?
  Filled 2022-03-26: qty 20

## 2022-03-26 MED ORDER — ACETAMINOPHEN 650 MG RE SUPP
650.0000 mg | Freq: Once | RECTAL | Status: AC
  Administered 2022-03-26: 650 mg via RECTAL

## 2022-03-26 MED ORDER — PHENYLEPHRINE 80 MCG/ML (10ML) SYRINGE FOR IV PUSH (FOR BLOOD PRESSURE SUPPORT)
PREFILLED_SYRINGE | INTRAVENOUS | Status: AC
  Filled 2022-03-26: qty 10

## 2022-03-26 MED ORDER — HEPARIN SODIUM (PORCINE) 1000 UNIT/ML IJ SOLN
INTRAMUSCULAR | Status: DC | PRN
  Administered 2022-03-26: 38000 [IU] via INTRAVENOUS

## 2022-03-26 MED ORDER — 0.9 % SODIUM CHLORIDE (POUR BTL) OPTIME
TOPICAL | Status: DC | PRN
  Administered 2022-03-26: 5000 mL
  Administered 2022-03-26: 1000 mL

## 2022-03-26 MED ORDER — PAPAVERINE HCL 30 MG/ML IJ SOLN
INTRAMUSCULAR | Status: AC
Start: 2022-03-26 — End: ?
  Filled 2022-03-26: qty 2

## 2022-03-26 MED ORDER — KETOROLAC TROMETHAMINE 15 MG/ML IJ SOLN
15.0000 mg | INTRAMUSCULAR | Status: AC
Start: 2022-03-26 — End: 2022-03-26
  Administered 2022-03-26: 15 mg via INTRAVENOUS

## 2022-03-26 MED ORDER — ACETAMINOPHEN 160 MG/5ML PO SOLN: 650.0000 mg | Freq: Once | ORAL | Status: AC

## 2022-03-26 MED ORDER — LACTATED RINGERS IV SOLN: INTRAVENOUS | Status: DC | PRN

## 2022-03-26 MED ORDER — SODIUM CHLORIDE 0.9 % IV SOLN
150.0000 mg | Freq: Two times a day (BID) | INTRAVENOUS | Status: DC
  Administered 2022-03-26 – 2022-03-27 (×2): 150 mg via INTRAVENOUS
  Filled 2022-03-26 (×3): qty 3

## 2022-03-26 MED ORDER — METHOCARBAMOL 500 MG PO TABS: 500.0000 mg | ORAL_TABLET | Freq: Four times a day (QID) | ORAL | Status: DC | PRN

## 2022-03-26 MED ORDER — TRAMADOL HCL 50 MG PO TABS: 50.0000 mg | ORAL_TABLET | ORAL | Status: DC | PRN

## 2022-03-26 MED ORDER — OXYCODONE HCL 5 MG PO TABS
5.0000 mg | ORAL_TABLET | ORAL | Status: DC | PRN
  Administered 2022-03-26 – 2022-03-29 (×13): 10 mg via ORAL
  Filled 2022-03-26 (×13): qty 2

## 2022-03-26 MED ORDER — MAGNESIUM SULFATE 4 GM/100ML IV SOLN
4.0000 g | Freq: Once | INTRAVENOUS | Status: AC
  Administered 2022-03-26: 4 g via INTRAVENOUS
  Filled 2022-03-26: qty 100

## 2022-03-26 MED ORDER — ACETAMINOPHEN 10 MG/ML IV SOLN
1000.0000 mg | Freq: Once | INTRAVENOUS | Status: AC
  Administered 2022-03-26: 1000 mg via INTRAVENOUS
  Filled 2022-03-26: qty 100

## 2022-03-26 MED ORDER — METOPROLOL TARTRATE 12.5 MG HALF TABLET: 12.5000 mg | ORAL_TABLET | Freq: Two times a day (BID) | ORAL | Status: DC

## 2022-03-26 MED ORDER — FENTANYL CITRATE PF 50 MCG/ML IJ SOSY
50.0000 ug | PREFILLED_SYRINGE | INTRAMUSCULAR | Status: DC | PRN
  Administered 2022-03-27 – 2022-03-28 (×4): 50 ug via INTRAVENOUS
  Filled 2022-03-26 (×4): qty 1

## 2022-03-26 MED ORDER — FAMOTIDINE IN NACL 20-0.9 MG/50ML-% IV SOLN
20.0000 mg | Freq: Two times a day (BID) | INTRAVENOUS | Status: DC
  Administered 2022-03-26: 20 mg via INTRAVENOUS
  Filled 2022-03-26: qty 50

## 2022-03-26 MED ORDER — VANCOMYCIN HCL 1000 MG IV SOLR
INTRAVENOUS | Status: DC | PRN
  Administered 2022-03-26: 1000 mL

## 2022-03-26 MED ORDER — ROCURONIUM BROMIDE 10 MG/ML (PF) SYRINGE
PREFILLED_SYRINGE | INTRAVENOUS | Status: DC | PRN
  Administered 2022-03-26 (×2): 100 mg via INTRAVENOUS
  Administered 2022-03-26: 50 mg via INTRAVENOUS
  Administered 2022-03-26: 100 mg via INTRAVENOUS

## 2022-03-26 MED ORDER — KETOROLAC TROMETHAMINE 15 MG/ML IJ SOLN
INTRAMUSCULAR | Status: AC
  Administered 2022-03-27: 30 mg via INTRAVENOUS
  Filled 2022-03-26: qty 1

## 2022-03-26 MED ORDER — SODIUM CHLORIDE 0.9% FLUSH: 10.0000 mL | INTRAVENOUS | Status: DC | PRN

## 2022-03-26 MED ORDER — SODIUM CHLORIDE 0.9% FLUSH
3.0000 mL | INTRAVENOUS | Status: DC | PRN
Start: 2022-03-27 — End: 2022-03-30

## 2022-03-26 MED ORDER — SODIUM CHLORIDE 0.45 % IV SOLN: INTRAVENOUS | Status: DC | PRN

## 2022-03-26 MED ORDER — MIDAZOLAM HCL (PF) 10 MG/2ML IJ SOLN
INTRAMUSCULAR | Status: AC
  Filled 2022-03-26: qty 2

## 2022-03-26 MED ORDER — PROTAMINE SULFATE 10 MG/ML IV SOLN
INTRAVENOUS | Status: DC | PRN
  Administered 2022-03-26: 390 mg via INTRAVENOUS

## 2022-03-26 MED ORDER — ASPIRIN 325 MG PO TBEC
325.0000 mg | DELAYED_RELEASE_TABLET | Freq: Every day | ORAL | Status: DC
  Administered 2022-03-27 – 2022-03-28 (×2): 325 mg via ORAL
  Filled 2022-03-26 (×2): qty 1

## 2022-03-26 MED ORDER — ARTIFICIAL TEARS OPHTHALMIC OINT
TOPICAL_OINTMENT | OPHTHALMIC | Status: AC
Start: 2022-03-26 — End: ?
  Filled 2022-03-26: qty 3.5

## 2022-03-26 MED ORDER — PHENYLEPHRINE 80 MCG/ML (10ML) SYRINGE FOR IV PUSH (FOR BLOOD PRESSURE SUPPORT)
PREFILLED_SYRINGE | INTRAVENOUS | Status: DC | PRN
  Administered 2022-03-26 (×3): 40 ug via INTRAVENOUS
  Administered 2022-03-26: 80 ug via INTRAVENOUS
  Administered 2022-03-26: 40 ug via INTRAVENOUS

## 2022-03-26 MED ORDER — NITROGLYCERIN 0.2 MG/ML ON CALL CATH LAB
INTRAVENOUS | Status: DC | PRN
  Administered 2022-03-26: 10 ug via INTRAVENOUS
  Administered 2022-03-26: 20 ug via INTRAVENOUS

## 2022-03-26 MED ORDER — DEXMEDETOMIDINE HCL IN NACL 400 MCG/100ML IV SOLN
0.0000 ug/kg/h | INTRAVENOUS | Status: DC
  Administered 2022-03-26: 0.6 ug/kg/h via INTRAVENOUS
  Filled 2022-03-26: qty 100

## 2022-03-26 MED ORDER — ACETAMINOPHEN 160 MG/5ML PO SOLN: 1000.0000 mg | Freq: Four times a day (QID) | ORAL | Status: DC

## 2022-03-26 MED ORDER — SODIUM CHLORIDE 0.9 % IV SOLN: 250.0000 mL | INTRAVENOUS | Status: DC

## 2022-03-26 MED ORDER — PLASMA-LYTE A IV SOLN
INTRAVENOUS | Status: DC | PRN
  Administered 2022-03-26: 500 mL via INTRAVASCULAR

## 2022-03-26 MED ORDER — BISACODYL 10 MG RE SUPP
10.0000 mg | Freq: Every day | RECTAL | Status: DC
  Filled 2022-03-26: qty 1

## 2022-03-26 MED ORDER — LACTATED RINGERS IV SOLN: 500.0000 mL | Freq: Once | INTRAVENOUS | Status: DC | PRN

## 2022-03-26 MED ORDER — KETOROLAC TROMETHAMINE 15 MG/ML IJ SOLN
15.0000 mg | Freq: Four times a day (QID) | INTRAMUSCULAR | Status: DC
Start: 2022-03-26 — End: 2022-03-26
  Administered 2022-03-26: 15 mg via INTRAVENOUS
  Filled 2022-03-26: qty 1

## 2022-03-26 MED ORDER — BISACODYL 5 MG PO TBEC
10.0000 mg | DELAYED_RELEASE_TABLET | Freq: Every day | ORAL | Status: DC
  Administered 2022-03-27 – 2022-03-29 (×3): 10 mg via ORAL
  Filled 2022-03-26 (×3): qty 2

## 2022-03-26 MED ORDER — SODIUM CHLORIDE 0.9 % IV SOLN: INTRAVENOUS | Status: DC

## 2022-03-26 MED ORDER — CHLORHEXIDINE GLUCONATE 0.12 % MT SOLN
15.0000 mL | OROMUCOSAL | Status: AC
  Administered 2022-03-26: 15 mL via OROMUCOSAL
  Filled 2022-03-26: qty 15

## 2022-03-26 MED ORDER — FENTANYL CITRATE (PF) 250 MCG/5ML IJ SOLN
INTRAMUSCULAR | Status: DC | PRN
  Administered 2022-03-26 (×2): 50 ug via INTRAVENOUS
  Administered 2022-03-26: 150 ug via INTRAVENOUS
  Administered 2022-03-26 (×2): 100 ug via INTRAVENOUS
  Administered 2022-03-26: 300 ug via INTRAVENOUS
  Administered 2022-03-26: 50 ug via INTRAVENOUS
  Administered 2022-03-26 (×3): 150 ug via INTRAVENOUS

## 2022-03-26 MED ORDER — ORAL CARE MOUTH RINSE
15.0000 mL | OROMUCOSAL | Status: DC
  Administered 2022-03-26 – 2022-03-27 (×12): 15 mL via OROMUCOSAL

## 2022-03-26 MED ORDER — VANCOMYCIN HCL IN DEXTROSE 1-5 GM/200ML-% IV SOLN
1000.0000 mg | Freq: Once | INTRAVENOUS | Status: AC
  Administered 2022-03-26: 1000 mg via INTRAVENOUS
  Filled 2022-03-26: qty 200

## 2022-03-26 MED ORDER — SODIUM CHLORIDE (PF) 0.9 % IJ SOLN
OROMUCOSAL | Status: DC | PRN
  Administered 2022-03-26 (×3): 4 mL via TOPICAL

## 2022-03-26 MED ORDER — SODIUM CHLORIDE 0.9% FLUSH
3.0000 mL | Freq: Two times a day (BID) | INTRAVENOUS | Status: DC
  Administered 2022-03-27 – 2022-03-30 (×7): 3 mL via INTRAVENOUS

## 2022-03-26 MED ORDER — CHLORHEXIDINE GLUCONATE CLOTH 2 % EX PADS
6.0000 | MEDICATED_PAD | Freq: Every day | CUTANEOUS | Status: DC
  Administered 2022-03-26 – 2022-03-29 (×3): 6 via TOPICAL

## 2022-03-26 MED ORDER — METOPROLOL TARTRATE 5 MG/5ML IV SOLN: 2.5000 mg | INTRAVENOUS | Status: DC | PRN

## 2022-03-26 MED ORDER — PROTAMINE SULFATE 10 MG/ML IV SOLN
INTRAVENOUS | Status: AC
  Filled 2022-03-26: qty 25

## 2022-03-26 MED ORDER — CEFAZOLIN SODIUM-DEXTROSE 2-4 GM/100ML-% IV SOLN
2.0000 g | Freq: Three times a day (TID) | INTRAVENOUS | Status: AC
  Administered 2022-03-26 – 2022-03-28 (×6): 2 g via INTRAVENOUS
  Filled 2022-03-26 (×6): qty 100

## 2022-03-26 MED ORDER — PROPOFOL 10 MG/ML IV BOLUS
INTRAVENOUS | Status: DC | PRN
  Administered 2022-03-26: 70 mg via INTRAVENOUS

## 2022-03-26 MED ORDER — ALBUMIN HUMAN 5 % IV SOLN: INTRAVENOUS | Status: DC | PRN

## 2022-03-26 MED ORDER — ROCURONIUM BROMIDE 10 MG/ML (PF) SYRINGE
PREFILLED_SYRINGE | INTRAVENOUS | Status: AC
  Filled 2022-03-26: qty 10

## 2022-03-26 MED ORDER — INSULIN REGULAR(HUMAN) IN NACL 100-0.9 UT/100ML-% IV SOLN: INTRAVENOUS | Status: DC

## 2022-03-26 MED ORDER — DEXTROSE 50 % IV SOLN: 0.0000 mL | INTRAVENOUS | Status: DC | PRN

## 2022-03-26 MED ORDER — ONDANSETRON HCL 4 MG/2ML IJ SOLN
4.0000 mg | Freq: Four times a day (QID) | INTRAMUSCULAR | Status: DC | PRN
  Administered 2022-03-26 – 2022-03-29 (×4): 4 mg via INTRAVENOUS
  Filled 2022-03-26 (×4): qty 2

## 2022-03-26 MED ORDER — PROTAMINE SULFATE 10 MG/ML IV SOLN
INTRAVENOUS | Status: AC
  Filled 2022-03-26: qty 15

## 2022-03-26 MED ORDER — HEMOSTATIC AGENTS (NO CHARGE) OPTIME
TOPICAL | Status: DC | PRN
  Administered 2022-03-26: 1 via TOPICAL

## 2022-03-26 MED ORDER — ALBUMIN HUMAN 5 % IV SOLN
250.0000 mL | INTRAVENOUS | Status: AC | PRN
  Administered 2022-03-26: 12.5 g via INTRAVENOUS

## 2022-03-26 MED ORDER — ASPIRIN 81 MG PO CHEW: 324.0000 mg | CHEWABLE_TABLET | Freq: Every day | ORAL | Status: DC

## 2022-03-26 MED ORDER — PAPAVERINE HCL 30 MG/ML IJ SOLN
INTRAMUSCULAR | Status: DC | PRN
  Administered 2022-03-26: 60 mg via INTRAVENOUS

## 2022-03-26 SURGICAL SUPPLY — 87 items
ADAPTER CARDIO PERF ANTE/RETRO (ADAPTER) ×2 IMPLANT
BAG DECANTER FOR FLEXI CONT (MISCELLANEOUS) ×2 IMPLANT
BLADE CLIPPER SURG (BLADE) ×2 IMPLANT
BLADE STERNUM SYSTEM 6 (BLADE) ×2 IMPLANT
BNDG ELASTIC 4X5.8 VLCR STR LF (GAUZE/BANDAGES/DRESSINGS) ×2 IMPLANT
BNDG ELASTIC 6X10 VLCR STRL LF (GAUZE/BANDAGES/DRESSINGS) IMPLANT
BNDG ELASTIC 6X5.8 VLCR STR LF (GAUZE/BANDAGES/DRESSINGS) ×2 IMPLANT
BNDG GAUZE DERMACEA FLUFF 4 (GAUZE/BANDAGES/DRESSINGS) ×2 IMPLANT
BOOT SUTURE AID YELLOW STND (SUTURE) IMPLANT
CANISTER SUCT 3000ML PPV (MISCELLANEOUS) ×2 IMPLANT
CANNULA NON VENT 22FR 12 (CANNULA) ×2 IMPLANT
CATH RETROPLEGIA CORONARY 14FR (CATHETERS) ×2 IMPLANT
CATH ROBINSON RED A/P 18FR (CATHETERS) ×6 IMPLANT
CATH THOR STR 32F SOFT 20 RADI (CATHETERS) ×4 IMPLANT
CATH THORACIC 28FR RT ANG (CATHETERS) ×2 IMPLANT
CLIP TI LARGE 6 (CLIP) IMPLANT
CLIP VESOCCLUDE MED 24/CT (CLIP) IMPLANT
CLIP VESOCCLUDE SM WIDE 24/CT (CLIP) IMPLANT
CNTNR URN SCR LID CUP LEK RST (MISCELLANEOUS) IMPLANT
CONT SPEC 4OZ STRL OR WHT (MISCELLANEOUS) ×6
CONTAINER PROTECT SURGISLUSH (MISCELLANEOUS) ×4 IMPLANT
DERMABOND ADVANCED (GAUZE/BANDAGES/DRESSINGS) ×2
DERMABOND ADVANCED .7 DNX12 (GAUZE/BANDAGES/DRESSINGS) IMPLANT
DRAPE CARDIOVASCULAR INCISE (DRAPES) ×2
DRAPE INCISE IOBAN 66X45 STRL (DRAPES) IMPLANT
DRAPE SRG 135X102X78XABS (DRAPES) ×2 IMPLANT
DRAPE WARM FLUID 44X44 (DRAPES) ×2 IMPLANT
DRSG AQUACEL AG ADV 3.5X10 (GAUZE/BANDAGES/DRESSINGS) IMPLANT
DRSG AQUACEL AG ADV 3.5X14 (GAUZE/BANDAGES/DRESSINGS) ×2 IMPLANT
ELECT BLADE 4.0 EZ CLEAN MEGAD (MISCELLANEOUS) ×2
ELECT CAUTERY BLADE 6.4 (BLADE) ×2 IMPLANT
ELECT REM PT RETURN 9FT ADLT (ELECTROSURGICAL) ×4
ELECTRODE BLDE 4.0 EZ CLN MEGD (MISCELLANEOUS) IMPLANT
ELECTRODE REM PT RTRN 9FT ADLT (ELECTROSURGICAL) ×4 IMPLANT
FELT TEFLON 1X6 (MISCELLANEOUS) ×4 IMPLANT
GAUZE SPONGE 4X4 12PLY STRL (GAUZE/BANDAGES/DRESSINGS) ×4 IMPLANT
GLOVE BIO SURGEON STRL SZ 6.5 (GLOVE) IMPLANT
GLOVE ECLIPSE 6.5 STRL STRAW (GLOVE) IMPLANT
GLOVE ECLIPSE 7.0 STRL STRAW (GLOVE) IMPLANT
GLOVE SS BIOGEL STRL SZ 7.5 (GLOVE) IMPLANT
GOWN STRL REUS W/ TWL LRG LVL3 (GOWN DISPOSABLE) ×12 IMPLANT
GOWN STRL REUS W/TWL LRG LVL3 (GOWN DISPOSABLE) ×12
HEMOSTAT POWDER SURGIFOAM 1G (HEMOSTASIS) ×6 IMPLANT
HEMOSTAT SURGICEL 2X14 (HEMOSTASIS) ×2 IMPLANT
INSERT FOGARTY 61MM (MISCELLANEOUS) IMPLANT
KIT BASIN OR (CUSTOM PROCEDURE TRAY) ×2 IMPLANT
KIT CATH CPB BARTLE (MISCELLANEOUS) ×2 IMPLANT
KIT SUCTION CATH 14FR (SUCTIONS) ×2 IMPLANT
KIT TURNOVER KIT B (KITS) ×2 IMPLANT
KIT VASOVIEW HEMOPRO 2 VH 4000 (KITS) ×2 IMPLANT
NS IRRIG 1000ML POUR BTL (IV SOLUTION) ×10 IMPLANT
PACK E OPEN HEART (SUTURE) ×2 IMPLANT
PACK OPEN HEART (CUSTOM PROCEDURE TRAY) ×2 IMPLANT
PAD ARMBOARD 7.5X6 YLW CONV (MISCELLANEOUS) ×4 IMPLANT
PAD ELECT DEFIB RADIOL ZOLL (MISCELLANEOUS) ×2 IMPLANT
PENCIL BUTTON HOLSTER BLD 10FT (ELECTRODE) ×2 IMPLANT
POSITIONER HEAD DONUT 9IN (MISCELLANEOUS) ×2 IMPLANT
PUNCH AORTIC ROTATE 4.0MM (MISCELLANEOUS) ×2 IMPLANT
PUNCH AORTIC ROTATE 4.5MM 8IN (MISCELLANEOUS) ×2 IMPLANT
SET MPS 3-ND DEL (MISCELLANEOUS) IMPLANT
SPONGE T-LAP 18X18 ~~LOC~~+RFID (SPONGE) IMPLANT
SUPPORT HEART JANKE-BARRON (MISCELLANEOUS) ×2 IMPLANT
SUT BONE WAX W31G (SUTURE) ×2 IMPLANT
SUT MNCRL AB 4-0 PS2 18 (SUTURE) ×4 IMPLANT
SUT PROLENE 4 0 RB 1 (SUTURE) ×2
SUT PROLENE 4 0 SH DA (SUTURE) ×2 IMPLANT
SUT PROLENE 4-0 RB1 .5 CRCL 36 (SUTURE) IMPLANT
SUT PROLENE 6 0 C 1 30 (SUTURE) IMPLANT
SUT PROLENE 7 0 BV1 MDA (SUTURE) ×2 IMPLANT
SUT SILK  1 MH (SUTURE) ×6
SUT SILK 1 MH (SUTURE) IMPLANT
SUT SILK 2 0 SH (SUTURE) IMPLANT
SUT STEEL SZ 6 DBL 3X14 BALL (SUTURE) ×6 IMPLANT
SUT VIC AB 0 CTX 36 (SUTURE) ×4
SUT VIC AB 0 CTX36XBRD ANTBCTR (SUTURE) ×4 IMPLANT
SUT VIC AB 2-0 CT1 27 (SUTURE) ×4
SUT VIC AB 2-0 CT1 TAPERPNT 27 (SUTURE) ×4 IMPLANT
SUT VIC AB 3-0 X1 27 (SUTURE) IMPLANT
SYSTEM SAHARA CHEST DRAIN ATS (WOUND CARE) ×2 IMPLANT
TAPE CLOTH 4X10 WHT NS (GAUZE/BANDAGES/DRESSINGS) IMPLANT
TAPE PAPER 2X10 WHT MICROPORE (GAUZE/BANDAGES/DRESSINGS) IMPLANT
TOWEL GREEN STERILE (TOWEL DISPOSABLE) ×2 IMPLANT
TOWEL GREEN STERILE FF (TOWEL DISPOSABLE) ×2 IMPLANT
TRAY FOLEY SLVR 16FR TEMP STAT (SET/KITS/TRAYS/PACK) ×2 IMPLANT
TUBING LAP HI FLOW INSUFFLATIO (TUBING) ×2 IMPLANT
UNDERPAD 30X36 HEAVY ABSORB (UNDERPADS AND DIAPERS) ×2 IMPLANT
WATER STERILE IRR 1000ML POUR (IV SOLUTION) ×4 IMPLANT

## 2022-03-26 NOTE — Progress Notes (Signed)
Report called to [facility], CRNA and patient transported off unit to pre-op.

## 2022-03-26 NOTE — Transfer of Care (Signed)
Immediate Anesthesia Transfer of Care Note  Patient: Wayne Vang  Procedure(s) Performed: CORONARY ARTERY BYPASS GRAFTING (CABG) X THREE, USING LEFT INTERNAL MAMMARY ARTERY AND RIGHT LEG GREATER SAPHENOUS VEIN (Chest) TRANSESOPHAGEAL ECHOCARDIOGRAM (TEE)  Patient Location: ICU  Anesthesia Type:General  Level of Consciousness: sedated and Patient remains intubated per anesthesia plan  Airway & Oxygen Therapy: Patient remains intubated per anesthesia plan and Patient placed on Ventilator (see vital sign flow sheet for setting)  Post-op Assessment: Report given to RN and Post -op Vital signs reviewed and stable  Post vital signs: Reviewed and stable  Last Vitals:  Vitals Value Taken Time  BP 112/70 03/26/22 1250  Temp 36.2 C 03/26/22 1304  Pulse 80 03/26/22 1304  Resp 12 03/26/22 1304  SpO2 98 % 03/26/22 1304  Vitals shown include unvalidated device data.  Last Pain:  Vitals:   03/26/22 0507  TempSrc: Oral  PainSc:          Complications: No notable events documented.

## 2022-03-26 NOTE — Anesthesia Postprocedure Evaluation (Signed)
Anesthesia Post Note  Patient: Wayne Vang  Procedure(s) Performed: CORONARY ARTERY BYPASS GRAFTING (CABG) X THREE, USING LEFT INTERNAL MAMMARY ARTERY AND RIGHT LEG GREATER SAPHENOUS VEIN (Chest) TRANSESOPHAGEAL ECHOCARDIOGRAM (TEE)     Patient location during evaluation: SICU Anesthesia Type: General Level of consciousness: sedated and patient remains intubated per anesthesia plan Pain management: pain level controlled Vital Signs Assessment: post-procedure vital signs reviewed and stable Respiratory status: patient remains intubated per anesthesia plan and patient on ventilator - see flowsheet for VS Cardiovascular status: stable Anesthetic complications: no   No notable events documented.  Last Vitals:  Vitals:   03/26/22 1515 03/26/22 1530  BP:    Pulse: 82   Resp: 18 (!) 29  Temp: 37.9 C (!) 38 C  SpO2: 96%     Last Pain:  Vitals:   03/26/22 1300  TempSrc: Core  PainSc:                  Nolon Nations

## 2022-03-26 NOTE — Procedures (Signed)
Extubation Procedure Note  Patient Details:   Name: Wayne Vang DOB: 1961-03-09 MRN: [id number]   Airway Documentation:    Vent end date: (not recorded) Vent end time: (not recorded)   Evaluation  O2 sats: stable throughout Complications: No apparent complications Patient did tolerate procedure well. Bilateral Breath Sounds: Diminished, Fine crackles   Yes, patient able to speak.  Patient extubated at 1732 per rapid wean protocol. RN x2 at bedside. VC .76, NIF -20, good patient effort. Patient able to follow commands. Placed on 4L nasal cannula. Vitals stable.  Seward Speck 03/26/2022, 5:39 PM

## 2022-03-26 NOTE — Anesthesia Procedure Notes (Signed)
Procedure Name: Intubation Date/Time: 03/26/2022 8:52 AM  Performed by: Carolan Clines, CRNAPre-anesthesia Checklist: Patient identified, Emergency Drugs available, Suction available and Patient being monitored Patient Re-evaluated:Patient Re-evaluated prior to induction Oxygen Delivery Method: Circle System Utilized Preoxygenation: Pre-oxygenation with 100% oxygen Induction Type: IV induction Ventilation: Oral airway inserted - appropriate to patient size and Two handed mask ventilation required Laryngoscope Size: Mac and 4 Grade View: Grade I Tube type: Oral Tube size: 8.0 mm Number of attempts: 1 Airway Equipment and Method: Stylet and Oral airway Placement Confirmation: ETT inserted through vocal cords under direct vision, positive ETCO2 and breath sounds checked- equal and bilateral Secured at: 24 cm Tube secured with: Tape Dental Injury: Teeth and Oropharynx as per pre-operative assessment

## 2022-03-26 NOTE — Consult Note (Signed)
NAME:  Wayne Vang, MRN:  [id number], DOB:  05-01-1961, LOS: 5 ADMISSION DATE:  03/19/2022, CONSULTATION DATE:  03/26/22 REFERRING MD:  Yolanda Manges, CHIEF COMPLAINT:  post-CABG   History of Present Illness:  Wayne Vang is a 61 y/o gentleman with a history of CAD and seizures who presented on 8/30 with NSTEMI and underwent LHC on 8/31 with severe 3 vessel CAD. He underwent CABG on 9/  with LIMA to the LAD and RSVG from the aorta to the [place] coronary artery sequenced to the OM1. He was transferred to the ICU with atrial pacing, on amiodarone and neosynephrine. He is waking up quickly post-operatively. Cardiac risk factors include family history of CAD, HLD, former tobacco abuse.   Pertinent  Medical History  Seizures GERD HH HLD  Significant Hospital Events: Including procedures, antibiotic start and stop dates in addition to other pertinent events   8/30 admission 9/6 CABG  Interim History / Subjective:    Objective   Blood pressure 112/70, pulse 80, temperature 98.9 F (37.2 C), temperature source Oral, resp. rate 12, height '6\' 2"'$  (1.88 m), weight 100 kg, SpO2 99 %. PAP: (20-36)/(-1-13) 25/12  Vent Mode: SIMV;PRVC;PSV FiO2 (%):  [50 %] 50 % Set Rate:  [12 bmp] 12 bmp Vt Set:  [650 mL] 650 mL PEEP:  [5 cmH20] 5 cmH20 Pressure Support:  [10 cmH20] 10 cmH20 Plateau Pressure:  [17 cmH20] 17 cmH20   Intake/Output Summary (Last 24 hours) at 03/26/2022 1338 Last data filed at 03/26/2022 1236 Gross per 24 hour  Intake 2643.37 ml  Output 1493 ml  Net 1150.37 ml   Filed Weights   03/19/22 1546 03/19/22 2200  Weight: 106.6 kg 100 kg    Examination: General: Critically ill-appearing man lying in bed no acute distress, intubated and sedated HENT: [place]/AT, eyes anicteric, endotracheal tube in place Lungs: Synchronous with vent, CTAB other than basilar rales on the left Cardiovascular: S1-S2, regular rate and rhythm, paced on telemetry Abdomen: Soft, nontender Extremities: No  peripheral edema, right lower extremity bandage Neuro: RASS -5 GU: Foley with clear urine output  Resolved Hospital Problem list     Assessment & Plan:  Coronary artery disease with NSTEMI, status post CABG x3 -Complete postop antibiotics - Rapid weaning protocol from mechanical ventilation - Postop insulin drip - Postop pain control per protocol - Continue telemetry monitoring - Continue pacing as required - Amiodarone infusion - Statin, defer restarting aspirin until tomorrow -metoprolol once off vasopressors  Postop ventilator management - Rapid wean protocol - VAP prevention protocol - Wean sedation as able  Acute anemia-expected blood loss -Monitor - Transfuse for hemoglobin less than 8 or hemodynamically significant bleeding  Hyperlipidemia - Statin  History of tobacco abuse, quit about 20 years ago - No indication for lung cancer screening given duration since he quit smoking  History of seizure disorder - Continue PTA dilantin; need to change meds if not extubated and able to take oral by tonight's dose -avoid tramadol  Best Practice (right click and "Reselect all SmartList Selections" daily)   Diet/type: NPO DVT prophylaxis: SCD GI prophylaxis: H2B and PPI Lines: Central line, Arterial Line, and yes and it is still needed Foley:  Yes, and it is still needed Code Status:  full code Last date of multidisciplinary goals of care discussion '[ ]'$   Labs   CBC: Recent Labs  Lab 03/22/22 0605 03/23/22 0154 03/24/22 0151 03/25/22 0337 03/26/22 0422 03/26/22 0857 03/26/22 1110 03/26/22 1130 03/26/22 1157 03/26/22 1200 03/26/22 1317  WBC 4.6 5.8 5.9 5.6 6.0  --   --   --   --   --   --   HGB 15.8 15.0 15.0 16.2 15.9   < > 11.0* 10.2* 10.5* 10.9* 11.6*  HCT 43.9 40.6 41.1 43.3 44.3   < > 29.2* 30.0* 31.0* 32.0* 34.0*  MCV 88.0 86.6 86.7 86.3 87.7  --   --   --   --   --   --   PLT 168 152 160 171 173  --  135*  --   --   --   --    < > = values in this  interval not displayed.    Basic Metabolic Panel: Recent Labs  Lab 03/22/22 0605 03/23/22 0154 03/24/22 0151 03/25/22 9629 03/26/22 0422 03/26/22 0857 03/26/22 0901 03/26/22 1009 03/26/22 1035 03/26/22 1101 03/26/22 1104 03/26/22 1130 03/26/22 1157 03/26/22 1200 03/26/22 1317  NA 140 138 141 141 141 139   < > 136   < > 135 135 135 136 136 137  K 3.8 3.8 3.7 3.8 4.1 3.8   < > 4.3   < > 5.1 5.1 5.9* 5.1 5.2* 4.1  CL 106 104 107 105 105 103  --  103  --  101  --   --  102  --   --   CO2 '27 24 26 29 26  '$ --   --   --   --   --   --   --   --   --   --   GLUCOSE 90 102* 97 94 97 97  --  106*  --  101*  --   --  109*  --   --   BUN '11 14 13 13 13 10  '$ --  11  --  10  --   --  10  --   --   CREATININE 0.86 1.06 0.87 0.85 0.91 0.50*  --  0.70  --  0.60*  --   --  0.60*  --   --   CALCIUM 9.1 8.8* 8.9 9.4 9.4  --   --   --   --   --   --   --   --   --   --    < > = values in this interval not displayed.   GFR: Estimated Creatinine Clearance: 122.5 mL/min (A) (by C-G formula based on SCr of 0.6 mg/dL (L)). Recent Labs  Lab 03/23/22 0154 03/24/22 0151 03/25/22 0337 03/26/22 0422  WBC 5.8 5.9 5.6 6.0    Liver Function Tests: Recent Labs  Lab 03/19/22 1707  AST 27  ALT 20  ALKPHOS 72  BILITOT 0.7  PROT 6.7  ALBUMIN 4.6   Recent Labs  Lab 03/19/22 1707  LIPASE 31   No results for input(s): "AMMONIA" in the last 168 hours.  ABG    Component Value Date/Time   PHART 7.395 03/26/2022 1317   PCO2ART 40.0 03/26/2022 1317   PO2ART 77 (L) 03/26/2022 1317   HCO3 24.6 03/26/2022 1317   TCO2 26 03/26/2022 1317   O2SAT 96 03/26/2022 1317     Coagulation Profile: Recent Labs  Lab 03/22/22 0605  INR 1.0    Cardiac Enzymes: No results for input(s): "CKTOTAL", "CKMB", "CKMBINDEX", "TROPONINI" in the last 168 hours.  HbA1C: Hgb A1c MFr Bld  Date/Time Value Ref Range Status  03/20/2022 12:10 AM 4.9 4.8 - 5.6 % Final    Comment:    (  NOTE) Pre diabetes:           5.7%-6.4%  Diabetes:              >6.4%  Glycemic control for   <7.0% adults with diabetes   04/01/2021 04:42 PM 5.2 4.6 - 6.5 % Final    Comment:    Glycemic Control Guidelines for People with Diabetes:Non Diabetic:  <6%Goal of Therapy: <7%Additional Action Suggested:  >8%     CBG: Recent Labs  Lab 03/22/22 1620 03/26/22 1311  GLUCAP 125* 124*    Review of Systems:   Unable to be obtained due to intubated, sedated status  Past Medical History:  He,  has a past medical history of Cancer ([place]), Esophageal reflux, Hiatal hernia, Low back pain, Other and unspecified hyperlipidemia, Seizure disorder ([place]), and Tremor.   Surgical History:   Past Surgical History:  Procedure Laterality Date   COLONOSCOPY     INGUINAL HERNIA REPAIR Right 12/06/2013   Procedure:  REPAIR RIGHT INGUINAL HERNIA;  Surgeon: Joyice Faster. Cornett, MD;  Location: [facility];  Service: General;  Laterality: Right;   INGUINAL HERNIA REPAIR Left 07/21/2013   INSERTION OF MESH N/A 12/06/2013   Procedure: INSERTION OF MESH;  Surgeon: Joyice Faster. Cornett, MD;  Location: [place];  Service: General;  Laterality: N/A;   LEFT HEART CATH AND CORONARY ANGIOGRAPHY N/A 03/20/2022   Procedure: LEFT HEART CATH AND CORONARY ANGIOGRAPHY;  Surgeon: Jettie Booze, MD;  Location: [facility];  Service: Cardiovascular;  Laterality: N/A;   NO PAST SURGERIES     POLYPECTOMY       Social History:   reports that he quit smoking about 19 years ago. His smoking use included cigarettes. He has never used smokeless tobacco. He reports current alcohol use. He reports that he does not use drugs.   Family History:  His family history includes Alcohol abuse in his brother; Alzheimer's disease in his mother; Cancer in his father; Colon polyps in his sister; Stroke in his sister. There is no history of Colon cancer, Esophageal cancer, Stomach cancer, or Rectal cancer.   Allergies Allergies   Allergen Reactions   Esomeprazole Magnesium Other (See Comments)    REACTION: abdominal pain   Simvastatin Other (See Comments)    Wasn't working for pt    Alum Hydroxide-Mag Carbonate Other (See Comments)    Medication interaction only     Home Medications  Prior to Admission medications   Medication Sig Start Date End Date Taking? Authorizing Provider  Aspirin-Acetaminophen-Caffeine (GOODY HEADACHE PO) Take 1 packet by mouth as needed (headache).   Yes [provider]  ibuprofen (ADVIL) 200 MG tablet Take 800 mg by mouth every 6 (six) hours as needed for mild pain.   Yes [provider]  lansoprazole (PREVACID) 30 MG capsule TAKE 1 CAPSULE BY MOUTH DAILY BEFORE BREAKFAST. Patient taking differently: Take 30 mg by mouth daily before breakfast. 10/18/21  Yes Copland, Frederico Hamman, MD  phenytoin (DILANTIN) 100 MG ER capsule TAKE 2 CAPSULES BY MOUTH EVERY MORNING AND TAKE 3 CAPSULES AT BEDTIME Patient taking differently: Take 200-300 mg by mouth See admin instructions. Take 2 capsules (200 mg) every morning and then take 3 capsules (300 mg) at bedtime 10/17/21  Yes Lomax, Amy, NP  potassium chloride (KLOR-CON) 10 MEQ tablet Take 10 mEq by mouth daily.   Yes [provider]  Vitamin D, Ergocalciferol, (DRISDOL) 1.25 MG (50000 UNIT) CAPS capsule Take 50,000 Units by mouth  daily.   Yes [provider]     Critical care time: 45 min.     Julian Hy, DO 03/26/22 2:29 PM [facility]

## 2022-03-26 NOTE — Anesthesia Procedure Notes (Signed)
Central Venous Catheter Insertion Performed by: Nolon Nations, MD, anesthesiologist Start/End9/12/2021 8:00 AM, 03/26/2022 8:05 AM Patient location: Pre-op. Preanesthetic checklist: patient identified, IV checked, site marked, risks and benefits discussed, surgical consent, monitors and equipment checked, pre-op evaluation, timeout performed and anesthesia consent Hand hygiene performed  and maximum sterile barriers used  PA cath was placed.[place]wan type:thermodilution PA Cath depth:43 Procedure performed without using ultrasound guided technique. Attempts: 1 Patient tolerated the procedure well with no immediate complications.

## 2022-03-26 NOTE — Progress Notes (Signed)
Rounding Note    Patient Name: Wayne Vang Date of Encounter: 03/26/2022  [place] Cardiologist: Donato Heinz, MD   Subjective   CABG this am   Inpatient Medications    Scheduled Meds:  [MAR Hold] aspirin EC  81 mg Oral Daily   chlorhexidine  15 mL Mouth/Throat Once   epinephrine  0-10 mcg/min Intravenous To OR   heparin sodium (porcine) 2,500 Units, papaverine 30 mg in electrolyte-A (PLASMALYTE-A PH 7.4) 500 mL irrigation   Irrigation To OR   insulin   Intravenous To OR   magnesium sulfate  40 mEq Other To OR   [MAR Hold] pantoprazole  40 mg Oral Daily   [MAR Hold] phenytoin  200 mg Oral q morning   [MAR Hold] phenytoin  300 mg Oral q1800   potassium chloride  80 mEq Other To OR   [MAR Hold] rosuvastatin  40 mg Oral Daily   [MAR Hold] sodium chloride flush  3 mL Intravenous Q12H   [MAR Hold] sodium chloride flush  3 mL Intravenous Q12H   tranexamic acid  15 mg/kg Intravenous To OR   tranexamic acid  2 mg/kg Intracatheter To OR   vancomycin (VANCOCIN) 1,000 mg in sodium chloride 0.9 % 1,000 mL irrigation   Irrigation To OR   Continuous Infusions:  [MAR Hold] sodium chloride      ceFAZolin (ANCEF) IV      ceFAZolin (ANCEF) IV     dexmedetomidine     heparin 30,000 units/NS 1000 mL solution for CELLSAVER     heparin Stopped (03/26/22 0740)   milrinone     nitroGLYCERIN     norepinephrine     tranexamic acid (CYKLOKAPRON) 2,500 mg in sodium chloride 0.9 % 250 mL (10 mg/mL) infusion     vancomycin     PRN Meds: [MAR Hold] sodium chloride, 0.9 % irrigation (POUR BTL), [MAR Hold] acetaminophen, heparin sodium (porcine) 2,500 Units, papaverine 30 mg in electrolyte-A (PLASMALYTE-A PH 7.4) 500 mL irrigation, [MAR Hold] nitroGLYCERIN, [MAR Hold] ondansetron (ZOFRAN) IV, [MAR Hold] polyvinyl alcohol, [MAR Hold] sodium chloride flush, temazepam   Vital Signs    Vitals:   03/26/22 0822 03/26/22 0823 03/26/22 0824 03/26/22 0825  BP:      Pulse: 61  (!) 57 61 65  Resp: '10 13 14   '$ Temp:      TempSrc:      SpO2: 97% 96% 97% 96%  Weight:      Height:        Intake/Output Summary (Last 24 hours) at 03/26/2022 0905 Last data filed at 03/25/2022 2100 Gross per 24 hour  Intake 338.37 ml  Output --  Net 338.37 ml      03/19/2022   10:00 PM 03/19/2022    3:46 PM 08/20/2021    7:10 AM  Last 3 Weights  Weight (lbs) 220 lb 6.4 oz 235 lb 236 lb  Weight (kg) 99.973 kg 106.595 kg 107.049 kg      Telemetry    Sinus rhythm with HR 60s-70s - Personally Reviewed  ECG    No new tracings - Personally Reviewed  Physical Exam   GEN: No acute distress.   Neck: No JVD Cardiac: RRR, no murmurs, rubs, or gallops.  Respiratory: Clear to auscultation bilaterally. GI: Soft, nontender, non-distended  MS: No edema; No deformity. Neuro:  Nonfocal  Psych: Normal affect  Right radial cath site C/D/I  Labs    High Sensitivity Troponin:   Recent Labs  Lab  03/19/22 1707 03/19/22 1855 03/19/22 2250 03/20/22 0010  TROPONINIHS 42* 52* 34* 37*     Chemistry Recent Labs  Lab 03/19/22 1707 03/20/22 0010 03/24/22 0151 03/25/22 0337 03/26/22 0422  NA 141   < > 141 141 141  K 3.9   < > 3.7 3.8 4.1  CL 101   < > 107 105 105  CO2 26   < > '26 29 26  '$ GLUCOSE 95   < > 97 94 97  BUN 12   < > '13 13 13  '$ CREATININE 1.00   < > 0.87 0.85 0.91  CALCIUM 9.6   < > 8.9 9.4 9.4  PROT 6.7  --   --   --   --   ALBUMIN 4.6  --   --   --   --   AST 27  --   --   --   --   ALT 20  --   --   --   --   ALKPHOS 72  --   --   --   --   BILITOT 0.7  --   --   --   --   GFRNONAA >60   < > >60 >60 >60  ANIONGAP 14   < > '8 7 10   '$ < > = values in this interval not displayed.    Lipids  Recent Labs  Lab 03/20/22 0010  CHOL 219*  TRIG 41  HDL 82  LDLCALC 129*  CHOLHDL 2.7    Hematology Recent Labs  Lab 03/24/22 0151 03/25/22 0337 03/26/22 0422  WBC 5.9 5.6 6.0  RBC 4.74 5.02 5.05  HGB 15.0 16.2 15.9  HCT 41.1 43.3 44.3  MCV 86.7 86.3 87.7  MCH  31.6 32.3 31.5  MCHC 36.5* 37.4* 35.9  RDW 11.9 11.9 12.0  PLT 160 171 173   Thyroid  Recent Labs  Lab 03/19/22 2250  TSH 1.321    BNPNo results for input(s): "BNP", "PROBNP" in the last 168 hours.  DDimer No results for input(s): "DDIMER" in the last 168 hours.   Radiology    No results found.  Cardiac Studies   Echo 03/20/22 1. Left ventricular ejection fraction, by estimation, is 60 to 65%. The  left ventricle has normal function. The left ventricle has no regional  wall motion abnormalities. There is mild concentric left ventricular  hypertrophy. Left ventricular diastolic  parameters are consistent with Grade I diastolic dysfunction (impaired  relaxation).   2. Right ventricular systolic function is normal. The right ventricular  size is normal. There is normal pulmonary artery systolic pressure.   3. The mitral valve is normal in structure. Trivial mitral valve  regurgitation. No evidence of mitral stenosis.   4. The aortic valve is tricuspid. Aortic valve regurgitation is not  visualized. No aortic stenosis is present.   5. The inferior vena cava is normal in size with greater than 50%  respiratory variability, suggesting right atrial pressure of 3 mmHg.    LEFT HEART CATH AND CORONARY ANGIOGRAPHY  03/20/22    Conclusion       Prox LAD lesion is 90% stenosed.   1st Mrg lesion is 25% stenosed.   Ost Cx to Prox Cx lesion is 75% stenosed.   The left ventricular systolic function is normal.   LV end diastolic pressure is normal.   The left ventricular ejection fraction is 55-65% by visual estimate.   There is no aortic valve stenosis.   Left  main equivalent disease with proximal LAD and ostial circumflex disease.    Will obtain cardiac surgery consult.  Restart IV heparin 2 hours after TR band removal.    Diagnostic Dominance: Right    Patient Profile     61 y.o. male with hx of seizures, GERD, hiatal hernia  and family hx of CAD who is being seen for the  evaluation of chest pressure and elevated troponin. Heart catheterization with left main equivalent disease. CT surgery was consulted and he is scheduled for CABG on Wed 03/26/22.  Assessment & Plan    CAD NSTEMI Angiography with left main equivalent disease with proximal LAD stenosis of 90% and ostial LCX disease with 75% stenosis CABG this am   Hyperlipidemia with LDL goal < 70 On crestor 40 mg 03/20/2022: Cholesterol 219; HDL 82; LDL Cholesterol 129; Triglycerides 41; VLDL 8 LP (a) 9.1 Crestor 40 mg may need PSK9 as outpatient   Seizure disorder Continue phenytoin    For questions or updates, please contact [place] Please consult [email or web address] for contact info under        Signed, Jenkins Rouge, MD  03/26/2022, 9:05 AM

## 2022-03-26 NOTE — Interval H&P Note (Signed)
History and Physical Interval Note:  03/26/2022 9:07 AM  Wayne Vang  has presented today for surgery, with the diagnosis of coronary artery disease.  The various methods of treatment have been discussed with the patient and family. After consideration of risks, benefits and other options for treatment, the patient has consented to  Procedure(s): CORONARY ARTERY BYPASS GRAFTING (CABG) (N/A) TRANSESOPHAGEAL ECHOCARDIOGRAM (TEE) (N/A) as a surgical intervention.  The patient's history has been reviewed, patient examined, no change in status, stable for surgery.  I have reviewed the patient's chart and labs.  Questions were answered to the patient's satisfaction.     [name]

## 2022-03-26 NOTE — Progress Notes (Signed)
CT surgery PM rounds  Patient extubated earlier this evening with adequate ABGs however having significant difficulty with surgical pain and splinting.  He has not responded to morphine so will change to small doses of fentanyl.  Toradol was started earlier at 15 mg but will transition to increased dose this p.m. P.m. labs satisfactory with hemoglobin 13, K 4.2, PO2 93, PCO2 49 without labored breathing.  Start DuoNeb nebulized therapy  Blood pressure 117/73, pulse 90, temperature 100.2 F (37.9 C), resp. rate (!) 23, height '6\' 2"'$  (1.88 m), weight 100 kg, SpO2 100 %.

## 2022-03-26 NOTE — Anesthesia Procedure Notes (Signed)
Central Venous Catheter Insertion Performed by: Nolon Nations, MD, anesthesiologist Start/End9/12/2021 7:40 AM, 03/26/2022 8:00 AM Patient location: Pre-op. Preanesthetic checklist: patient identified, IV checked, site marked, risks and benefits discussed, surgical consent, monitors and equipment checked, pre-op evaluation, timeout performed and anesthesia consent Position: Trendelenburg Lidocaine 1% used for infiltration and patient sedated Hand hygiene performed  and maximum sterile barriers used  Catheter size: 8.5 Fr Sheath introducer PA Cath depth:43 Procedure performed using ultrasound guided technique. Ultrasound Notes:anatomy identified, needle tip was noted to be adjacent to the nerve/plexus identified, no ultrasound evidence of intravascular and/or intraneural injection and image(s) printed for medical record Attempts: 1 Following insertion, line sutured, dressing applied and Biopatch. Post procedure assessment: blood return through all ports, free fluid flow and no air  Patient tolerated the procedure well with no immediate complications.

## 2022-03-26 NOTE — Brief Op Note (Signed)
03/19/2022 - 03/26/2022  3:37 PM  PATIENT:  Wayne Vang  61 y.o. male  PRE-OPERATIVE DIAGNOSIS:  coronary artery disease  POST-OPERATIVE DIAGNOSIS:  coronary artery disease  PROCEDURE:  Procedure(s): CORONARY ARTERY BYPASS GRAFTING (CABG) X THREE, USING LEFT INTERNAL MAMMARY ARTERY AND RIGHT LEG GREATER SAPHENOUS VEIN (N/A) TRANSESOPHAGEAL ECHOCARDIOGRAM (TEE) (N/A) Vein harvest time: 81mn Vein prep time: 11m LIMA-LAD SEQ SVG-OM-RAMUS  SURGEON:  Surgeon(s) and Role:    * Coralie CommonMD - Primary    * HeMelrose NakayamaMD - Assisting  PHYSICIAN ASSISTANT: Naquita Nappier PA-C  ASSISTANTS: STAFF RNFA   ANESTHESIA:   general  EBL:  458 mL   BLOOD ADMINISTERED:none  DRAINS:  LEFT PLEURAL AND MEDIASTINAL CHEST TUBES    LOCAL MEDICATIONS USED:  NONE  SPECIMEN:  No Specimen  DISPOSITION OF SPECIMEN:  N/A  COUNTS:  YES  TOURNIQUET:  * No tourniquets in log *  DICTATION: .Dragon Dictation  PLAN OF CARE: Admit to inpatient   PATIENT DISPOSITION:  ICU - intubated and hemodynamically stable.   Delay start of Pharmacological VTE agent (>24hrs) due to surgical blood loss or risk of bleeding: yes  COMPLICATIONS: NO KNOWN

## 2022-03-26 NOTE — Anesthesia Procedure Notes (Signed)
Arterial Line Insertion Start/End9/12/2021 7:55 AM Performed by: Carolan Clines, CRNA, CRNA  Patient location: Pre-op. Preanesthetic checklist: patient identified, IV checked, site marked, risks and benefits discussed, surgical consent, monitors and equipment checked, pre-op evaluation, timeout performed and anesthesia consent Lidocaine 1% used for infiltration Left, radial was placed Catheter size: 20 G Hand hygiene performed  and maximum sterile barriers used   Attempts: 1 Procedure performed without using ultrasound guided technique. Following insertion, dressing applied and Biopatch. Post procedure assessment: normal and unchanged  Patient tolerated the procedure well with no immediate complications.

## 2022-03-26 NOTE — Interval H&P Note (Deleted)
History and Physical Interval Note:  03/26/2022 7:12 AM  Wayne Vang  has presented today for surgery, with the diagnosis of unstable angina.  The various methods of treatment have been discussed with the patient and family. After consideration of risks, benefits and other options for treatment, the patient has consented to  Procedure(s): LEFT HEART CATH AND CORONARY ANGIOGRAPHY (N/A) as a surgical intervention.  The patient's history has been reviewed, patient examined, no change in status, stable for surgery.  I have reviewed the patient's chart and labs.  Questions were answered to the patient's satisfaction.     [name]

## 2022-03-26 NOTE — Hospital Course (Addendum)
History of Present Illness: Wayne Vang is a 61 year old male being seen for cardiothoracic surgical consultation for consideration of coronary artery surgical revascularization. The patient has no significant previous cardiac risk factors with the exception of hyperlipidemia. He does have a significant past medical history for esophageal reflux and hiatal hernia as well as a seizure disorder (over 40 years but no siezures in 20 years on meds).  Patient presented to the emergency department with chest pain.  He notes that he has had exertional chest discomfort for approximately 6 months which would typically resolve with rest.  The symptoms have increased in severity and duration over the past 1 to 2 months.  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  He describes the pain as a squeezing type pressure in the lower portion of the sternal region.  It is always relieved with rest. There is no radiation associated with it.  He was found in the emergency department to have an elevated troponin and cardiology consultation was obtained.  He was felt to require admission for further evaluation and treatment.  High-sensitivity troponin levels have been mildly elevated and peaked at 52.  EKG showed no ST or T wave abnormalities.  He underwent cardiac catheterization on 03/20/2022 and is found to have severe LAD and circumflex disease.  Left ventricular end-diastolic pressure is noted to be in the normal range and left ventricular ejection fraction is 55 to 65% by visual estimate.  This is confirmed on echocardiogram also done on today's date. Additionally there were no regional wall motion abnormalities.  There is mild concentric left ventricular hypertrophy.  Findings are consistent with grade 1 diastolic dysfunction.  Right ventricular function is normal.  There are no significant valvular abnormalities.  He is a former smoker but quit approximately 20 years ago.  He has no  history of diabetes. Hemoglobin A1c is 4.9.  The patient and all relevant studies were reviewed by Dr. Lavonna Monarch who recommended coronary artery bypass grafting as [name]' best long term treatment option.  All the risks and benefits were discussed with the patient who agreed to proceed.  The patient remained medically stable.  Hospital course: Following full diagnostic evaluation and medical stabilization the patient was felt to be a candidate to proceed with CABG.  On 03/26/2022 he was taken to the operating room at which time he underwent coronary artery bypass grafting x 3 utilizing LIMA to LAD and sequential SVG to OM to Ramus.  He tolerated the procedure well and was taken to the surgical intensive care unit in stable condition. He was extubated routinely the evening of surgery. On POD1 his drips were weaned as tolerated. Plavix was started due to NSTEMI on presentation. Beta blocker was started and titrated appropriately.On POD 2 his chest tubes and epicardial pacing wires were removed without complication. His foley catheter and central line were also removed and he was felt stable for transfer to the progressive unit. He was volume overloaded so he was diuresed appropriately.

## 2022-03-26 NOTE — Op Note (Signed)
[address]       [place] [address]             [phone]                                          03/26/2022 Patient:  Wayne Vang Pre-Op Dx: CAD sp NSTEMI   Post-op Dx:  Same Procedure: CABG X 3  with LIMA to the LAD and RSVG from the aorta to the Ramus coronary artery sequenced to the OM1 Endoscopic greater saphenous vein harvest on the right   Surgeon and Role:      [name], MD- Primary      Merilynn Finland MD- [place] , PA-C - assisting An experienced assistant was required given the complexity of this surgery and the standard of surgical care. The assistant was needed for exposure, dissection, suctioning, retraction of delicate tissues and sutures, instrument exchange and for overall help during this procedure.   Referring Cardiologist: Claudette Stapler MD Anesthesia  Aram Beecham MD with general anesthesia EBL:  minimal ml Blood Administration: none Xclamp Time:  45 min Pump Time:  77mn  Drains: 363F chest tubes to the posterior mediastinum, Left pleural space and anterior mediastinum Wires: atrial and ventricular Counts: correct   Indications:  NSTEMI: Pt with class II NYHA anginal symptoms and has a class I indication for CABG. Pt's targets include LAD and OM (Ramus will be evaluated at time of surgery for additional grafting). All of the risks and goals of the operation including infection, bleeding, arrhythmias, stroke, heart attack and death have been discussed with pt and wife in room at the time of the consult and they agree with plan and wish to proceed.   Findings: The heart was normal and all the vessels were 2.581mwith mild disease. The SVG was slightly enlarged but of sufficient quality. After CPB the LV was normal in function  Operative Technique: All invasive lines were placed in pre-op holding.  After the risks, benefits and alternatives were thoroughly discussed, the patient was brought to  the operative theatre.  Anesthesia was induced, and the patient was prepped and draped in normal sterile fashion.  An appropriate surgical pause was performed, and pre-operative antibiotics were dosed accordingly.  We began with simultaneous incisions along the right leg for harvesting of the greater saphenous vein and the chest for the sternotomy.  In regards to the sternotomy, this was carried down with bovie cautery, and the sternum was divided with a reciprocating saw.  Meticulous hemostasis was obtained.  The left internal thoracic artery was exposed and harvested in in pedicled fashion.  The patient was systemically heparinized, and the artery was divided distally, and placed in a papaverine sponge.    The sternal elevator was removed, and a retractor was placed.  The pericardium was divided in the midline and fashioned into a cradle with pericardial stitches.   After we confirmed an appropriate ACT, the ascending aorta was cannulated in standard fashion.  The right atrial appendage was used for venous cannulation site.  Antegrade and Retrograde cannulas were placed in the ascending aorta and coronary sinus respectfully. Cardiopulmonary bypass was initiated, and the heart retractor was placed. The cross clamp was applied, and a dose of anterograde cardioplegia was given with good arrest of the heart which  was followed by an additional dose for 5 minutes with half antegrade and half retrograde.   Next we exposed the lateral wall, and found a good target on both the Ramus and OM vessels. .  An end to side anastomosis with the vein graft was then created to the OM and was brought back as an side to side anastamosis to the Ramus. The grafts were flushed and had hemostasis and good flow.    Finally, we exposed a good target on the mid LAD, and fashioned an end to side anastomosis between it and the LITA.  We began to re-warm, and a re-animation dose of cardioplegia was given retrograde  The heart was de-aired,  and the cross clamp was removed.  Meticulous hemostasis was obtained.    A partial occludding clamp was then placed on the ascending aorta, and we created an end to side anastomosis between it and the proximal vein graft to the OM and Ramus. .  Ring were placed on the proximal anastomosis. The clamp was removed and the proximal and distal anastomosis were viewed and were hemostatic.   Atrial and venticular pacing wires were placed and we separated from cardiopulmonary bypass without event.  The heparin was reversed with protamine.  Chest tubes and wires were placed, and the sternum was re-approximated with sternal wires.  The soft tissue and skin were re-approximated wth absorbable suture.  Sterile dressings were placed. I was present throughout the entire procedure.  The patient tolerated the procedure without any immediate complications, and was transferred to the ICU in guarded condition.  [name] MD

## 2022-03-27 ENCOUNTER — Ambulatory Visit: Payer: PRIVATE HEALTH INSURANCE | Admitting: Family Medicine

## 2022-03-27 ENCOUNTER — Inpatient Hospital Stay (HOSPITAL_COMMUNITY): Payer: BC Managed Care – PPO

## 2022-03-27 ENCOUNTER — Encounter (HOSPITAL_COMMUNITY): Payer: Self-pay | Admitting: Thoracic Surgery (Cardiothoracic Vascular Surgery)

## 2022-03-27 DIAGNOSIS — I214 Non-ST elevation (NSTEMI) myocardial infarction: Secondary | ICD-10-CM | POA: Diagnosis not present

## 2022-03-27 LAB — CBC
HCT: 31 % — ABNORMAL LOW (ref 39.0–52.0)
HCT: 33 % — ABNORMAL LOW (ref 39.0–52.0)
Hemoglobin: 11.6 g/dL — ABNORMAL LOW (ref 13.0–17.0)
Hemoglobin: 12.1 g/dL — ABNORMAL LOW (ref 13.0–17.0)
MCH: 32.4 pg (ref 26.0–34.0)
MCH: 32.4 pg (ref 26.0–34.0)
MCHC: 37.4 g/dL — ABNORMAL HIGH (ref 30.0–36.0)
MCHC: 36.7 g/dL — ABNORMAL HIGH (ref 30.0–36.0)
MCV: 86.6 fL (ref 80.0–100.0)
MCV: 88.2 fL (ref 80.0–100.0)
Platelets: 124 10*3/uL — ABNORMAL LOW (ref 150–400)
Platelets: 126 10*3/uL — ABNORMAL LOW (ref 150–400)
RBC: 3.58 MIL/uL — ABNORMAL LOW (ref 4.22–5.81)
RBC: 3.74 MIL/uL — ABNORMAL LOW (ref 4.22–5.81)
RDW: 12 % (ref 11.5–15.5)
RDW: 12.3 % (ref 11.5–15.5)
WBC: 9.8 10*3/uL (ref 4.0–10.5)
WBC: 9.4 10*3/uL (ref 4.0–10.5)
nRBC: 0 % (ref 0.0–0.2)
nRBC: 0 % (ref 0.0–0.2)

## 2022-03-27 LAB — BASIC METABOLIC PANEL
Anion gap: 7 (ref 5–15)
Anion gap: 7 (ref 5–15)
BUN: 12 mg/dL (ref 8–23)
BUN: 14 mg/dL (ref 8–23)
CO2: 24 mmol/L (ref 22–32)
CO2: 27 mmol/L (ref 22–32)
Calcium: 8 mg/dL — ABNORMAL LOW (ref 8.9–10.3)
Calcium: 8.2 mg/dL — ABNORMAL LOW (ref 8.9–10.3)
Chloride: 105 mmol/L (ref 98–111)
Chloride: 104 mmol/L (ref 98–111)
Creatinine, Ser: 0.86 mg/dL (ref 0.61–1.24)
Creatinine, Ser: 1.14 mg/dL (ref 0.61–1.24)
GFR, Estimated: >60 mL/min (ref >60)
GFR, Estimated: >60 mL/min (ref >60)
Glucose, Bld: 119 mg/dL — ABNORMAL HIGH (ref 70–99)
Glucose, Bld: 189 mg/dL — ABNORMAL HIGH (ref 70–99)
Potassium: 4.6 mmol/L (ref 3.5–5.1)
Potassium: 4.4 mmol/L (ref 3.5–5.1)
Sodium: 136 mmol/L (ref 135–145)
Sodium: 138 mmol/L (ref 135–145)

## 2022-03-27 LAB — MAGNESIUM
Magnesium: 2.3 mg/dL (ref 1.7–2.4)
Magnesium: 2.2 mg/dL (ref 1.7–2.4)

## 2022-03-27 LAB — GLUCOSE, CAPILLARY
Glucose-Capillary: 104 mg/dL — ABNORMAL HIGH (ref 70–99)
Glucose-Capillary: 115 mg/dL — ABNORMAL HIGH (ref 70–99)
Glucose-Capillary: 116 mg/dL — ABNORMAL HIGH (ref 70–99)
Glucose-Capillary: 119 mg/dL — ABNORMAL HIGH (ref 70–99)
Glucose-Capillary: 122 mg/dL — ABNORMAL HIGH (ref 70–99)
Glucose-Capillary: 131 mg/dL — ABNORMAL HIGH (ref 70–99)
Glucose-Capillary: 134 mg/dL — ABNORMAL HIGH (ref 70–99)
Glucose-Capillary: 136 mg/dL — ABNORMAL HIGH (ref 70–99)
Glucose-Capillary: 138 mg/dL — ABNORMAL HIGH (ref 70–99)
Glucose-Capillary: 84 mg/dL (ref 70–99)

## 2022-03-27 MED ORDER — FUROSEMIDE 10 MG/ML IJ SOLN
20.0000 mg | Freq: Once | INTRAMUSCULAR | Status: AC
  Administered 2022-03-27: 20 mg via INTRAVENOUS
  Filled 2022-03-27: qty 2

## 2022-03-27 MED ORDER — PHENOL 1.4 % MT LIQD
1.0000 | OROMUCOSAL | Status: DC | PRN
  Administered 2022-03-27: 1 via OROMUCOSAL
  Filled 2022-03-27: qty 177

## 2022-03-27 MED ORDER — GABAPENTIN 300 MG PO CAPS
300.0000 mg | ORAL_CAPSULE | Freq: Two times a day (BID) | ORAL | Status: DC
  Administered 2022-03-27 – 2022-03-30 (×7): 300 mg via ORAL
  Filled 2022-03-27 (×7): qty 1

## 2022-03-27 MED ORDER — INSULIN ASPART 100 UNIT/ML IJ SOLN
0.0000 [IU] | INTRAMUSCULAR | Status: DC
  Administered 2022-03-28 (×2): 2 [IU] via SUBCUTANEOUS

## 2022-03-27 MED ORDER — PHENYTOIN SODIUM EXTENDED 100 MG PO CAPS
300.0000 mg | ORAL_CAPSULE | Freq: Every day | ORAL | Status: DC
  Administered 2022-03-27 – 2022-03-29 (×3): 300 mg via ORAL
  Filled 2022-03-27 (×4): qty 3

## 2022-03-27 MED ORDER — CLOPIDOGREL BISULFATE 75 MG PO TABS
75.0000 mg | ORAL_TABLET | Freq: Every day | ORAL | Status: DC
  Administered 2022-03-27 – 2022-03-30 (×4): 75 mg via ORAL
  Filled 2022-03-27 (×4): qty 1

## 2022-03-27 MED ORDER — PHENYTOIN SODIUM EXTENDED 100 MG PO CAPS
200.0000 mg | ORAL_CAPSULE | Freq: Every morning | ORAL | Status: DC
  Administered 2022-03-27 – 2022-03-30 (×4): 200 mg via ORAL
  Filled 2022-03-27 (×4): qty 2

## 2022-03-27 MED ORDER — METOPROLOL TARTRATE 25 MG PO TABS
25.0000 mg | ORAL_TABLET | Freq: Two times a day (BID) | ORAL | Status: DC
  Administered 2022-03-27 – 2022-03-30 (×7): 25 mg via ORAL
  Filled 2022-03-27 (×7): qty 1

## 2022-03-27 MED ORDER — METOPROLOL TARTRATE 25 MG/10 ML ORAL SUSPENSION
25.0000 mg | Freq: Two times a day (BID) | ORAL | Status: DC
  Filled 2022-03-27 (×3): qty 10

## 2022-03-27 NOTE — Discharge Summary (Signed)
[address]       [place] [address]             [phone]    Physician Discharge Summary  Patient ID: Wayne Vang MRN: [id number] DOB/AGE: 29-May-1961 61 y.o.  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 LOVE) 07/27/2008   GERD 04/21/2007     Discharged Condition: {condition:18240}  History of Present Illness: Wayne Vang is a 61 year old male being seen for cardiothoracic surgical consultation for consideration of coronary artery surgical revascularization. The patient has no significant previous cardiac risk factors with the exception of hyperlipidemia. He does have a significant past medical history for esophageal reflux and hiatal hernia as well as a seizure disorder (over 40 years but no siezures in 20 years on meds).  Patient presented to the emergency department with chest pain.  He notes that he has had exertional chest discomfort for approximately 6 months which would typically resolve with rest.  The symptoms have increased in severity and duration over the past 1 to 2 months.  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  He describes the pain as a squeezing type pressure in the lower portion of the sternal region.  It is always relieved with rest. There is no radiation associated with it.  He was found in the emergency  department to have an elevated troponin and cardiology consultation was obtained.  He was felt to require admission for further evaluation and treatment.  High-sensitivity troponin levels have been mildly elevated and peaked at 52.  EKG showed no ST or T wave abnormalities.  He underwent cardiac catheterization on 03/20/2022 and is found to have severe LAD and circumflex disease.  Left ventricular end-diastolic pressure is noted to be in the normal range and left ventricular ejection fraction is 55 to 65% by visual estimate.  This is confirmed on echocardiogram also done on today's date. Additionally there were no regional wall motion abnormalities.  There is mild concentric left ventricular hypertrophy.  Findings are consistent with grade 1 diastolic dysfunction.  Right ventricular function is normal.  There are no significant valvular abnormalities.  He is a former smoker but quit approximately 20 years ago.  He has no history of diabetes. Hemoglobin A1c is 4.9.  The patient and all relevant studies were reviewed by Dr. Lavonna Monarch who recommended coronary artery bypass grafting as Wayne Vang' best long term treatment option.  All the risks and benefits were discussed with the patient who agreed to proceed.  The patient remained medically stable.  Hospital course: Following full diagnostic evaluation and medical stabilization the patient was felt to be a candidate to proceed with CABG.  On 03/26/2022 he was taken to the operating room at which time he underwent coronary artery bypass grafting x 3 utilizing LIMA to LAD and sequential SVG to OM to Ramus.  He tolerated the procedure well and was taken to the surgical intensive care unit in stable condition. He was extubated routinely the evening of surgery. On POD1 his drips were weaned as tolerated. Plavix was started due to NSTEMI on presentation. Beta blocker was started and titrated appropriately.On POD 2 his chest tubes and epicardial pacing wires were removed  without complication. His foley catheter and central line were also removed and he was felt stable for transfer to the progressive unit. He was volume overloaded so he was diuresed appropriately.   Consults: {consultation:18241}  Significant Diagnostic Studies:   LEFT HEART CATH AND CORONARY ANGIOGRAPHY    Prox LAD lesion is 90% stenosed.   1st Mrg lesion is 25% stenosed.   Ost Cx to Prox Cx lesion is 75% stenosed.   The left ventricular systolic function is normal.   LV end diastolic pressure is normal.   The left ventricular ejection fraction is 55-65% by visual estimate.   There is no aortic valve stenosis.   Left main equivalent disease with proximal LAD and ostial circumflex disease.    Will obtain cardiac surgery consult.  Restart IV heparin 2 hours after TR band removal.        ECHOCARDIOGRAM REPORT         Patient Name:   Wayne Vang Date of Exam: 03/20/2022  Medical Rec #:  [id number]       Height:       74.0 in  Accession #:    [id number]      Weight:       220.4 lb  Date of Birth:  1961/01/23        BSA:          2.265 m  Patient Age:    89 years        BP:           113/84 mmHg  Patient Gender: M               HR:           65 bpm.  Exam Location:  Inpatient   Procedure: 2D Echo, Cardiac Doppler and Color Doppler   Indications:    NSTEMI I21.4     History:        Patient has no prior history of Echocardiogram  examinations.                  Risk Factors:Dyslipidemia. NSTEMI.     Sonographer:    Ronny Flurry  Referring Phys: [id number] Crista Luria BHAGAT   IMPRESSIONS     1. Left ventricular ejection fraction, by estimation, is 60 to 65%. The  left ventricle has normal function. The left ventricle has no regional  wall motion abnormalities. There is mild concentric left ventricular  hypertrophy. Left ventricular diastolic  parameters are consistent with Grade I diastolic dysfunction (impaired  relaxation).   2. Right ventricular systolic function is  normal. The right ventricular  size is normal. There is normal pulmonary artery systolic pressure.   3. The mitral valve is normal in structure. Trivial mitral valve  regurgitation. No evidence of mitral stenosis.   4. The aortic valve is tricuspid. Aortic valve regurgitation is not  visualized. No aortic stenosis is present.   5. The inferior vena cava is normal in size with greater than 50%  respiratory variability, suggesting right atrial pressure of 3 mmHg.   FINDINGS   Left Ventricle: Left ventricular ejection fraction, by estimation, is 60  to 65%. The left ventricle has normal function. The left ventricle has no  regional wall motion abnormalities. The left ventricular internal cavity  size was normal in size. There is   mild concentric left ventricular hypertrophy. Left ventricular diastolic  parameters are consistent with Grade I diastolic dysfunction (impaired  relaxation).   Right Ventricle: The right ventricular size is normal. No increase in  right ventricular wall thickness. Right ventricular systolic function is  normal. There is normal pulmonary artery systolic pressure. The tricuspid  regurgitant velocity is 0.94 m/s, and   with an assumed right atrial pressure of 3 mmHg, the estimated right  ventricular systolic pressure is 6.5 mmHg.   Left Atrium: Left atrial size was normal in size.   Right Atrium: Right atrial size was normal in size.   Pericardium: There is no evidence of pericardial effusion.   Mitral Valve: The mitral valve is normal in structure. Trivial mitral  valve regurgitation. No evidence of mitral valve stenosis.   Tricuspid Valve: The tricuspid valve is normal in structure. Tricuspid  valve regurgitation is trivial. No evidence of tricuspid stenosis.   Aortic Valve: The aortic valve is tricuspid. Aortic valve regurgitation is  not visualized. No aortic stenosis is present.   Pulmonic Valve: The pulmonic valve was normal in structure. Pulmonic  valve  regurgitation is not visualized. No evidence of pulmonic stenosis.   Aorta: The aortic root is normal in size and structure.   Venous: The inferior vena cava is normal in size with greater than 50%  respiratory variability, suggesting right atrial pressure of 3 mmHg.   IAS/Shunts: No atrial level shunt detected by color flow Doppler.      LEFT VENTRICLE  PLAX 2D  LVIDd:         4.30 cm   Diastology  LVIDs:         2.80 cm   LV e' medial:    5.98 cm/s  LV PW:         1.10 cm   LV E/e' medial:  10.3  LV IVS:        1.20 cm   LV e' lateral:   8.59 cm/s  LVOT diam:     2.10 cm   LV E/e' lateral: 7.2  LV SV:         67  LV SV Index:   29  LVOT Area:     3.46 cm      RIGHT VENTRICLE  RV S prime:     10.20 cm/s  TAPSE (M-mode): 2.0 cm   LEFT ATRIUM           Index        RIGHT ATRIUM           Index  LA diam:      3.10 cm 1.37 cm/m   RA Area:     17.50 cm  LA Vol (A2C): 37.5 ml 16.56 ml/m  RA Volume:   52.40 ml  23.13 ml/m   AORTIC VALVE  LVOT Vmax:   89.40 cm/s  LVOT Vmean:  58.300 cm/s  LVOT VTI:    0.192 m     AORTA  Ao Root diam: 3.40 cm  Ao Asc diam:  3.30 cm   MITRAL VALVE               TRICUSPID VALVE  MV Area (PHT): 2.69 cm    TR Peak grad:   3.5 mmHg  MV Decel Time: 282 msec  TR Vmax:        94.00 cm/s  MV E velocity: 61.77 cm/s  MV A velocity: 71.40 cm/s  SHUNTS  MV E/A ratio:  0.87        Systemic VTI:  0.19 m                             Systemic Diam: 2.10 cm   Skeet Latch MD  Electronically signed by Skeet Latch MD  Signature Date/Time: 03/20/2022/2:42:33 PM        Treatments: Surgery  03/26/2022 Patient:  Wayne Vang Pre-Op Dx: CAD sp NSTEMI   Post-op Dx:  Same Procedure: CABG X 3  with LIMA to the LAD and RSVG from the aorta to the Ramus coronary artery sequenced to the OM1 Endoscopic greater saphenous vein harvest on the right  Discharge Exam: Blood pressure 114/65, pulse 77, temperature 99.4 F (37.4 C), temperature  source Oral, resp. rate 11, height _0  (1.88 m), weight 101.2 kg, SpO2 97 %. {physical [id number]   Discharge Medications:  The patient has been discharged on:   1.Beta Blocker:  Yes [   ]                              No   [   ]                              If No, reason:  2.Ace Inhibitor/ARB: Yes [   ]                                     No  [    ]                                     If No, reason:  3.Statin:   Yes [   ]                  No  [   ]                  If No, reason:  4.Ecasa:  Yes  [   ]                  No   [   ]                  If No, reason:  Patient had ACS upon admission:  Plavix/P2Y12 inhibitor: Yes [   ]                                      No  [   ]     Discharge Instructions     AMB Referral to Cardiac Rehabilitation - Phase II   Complete by: As directed    Diagnosis: CABG   CABG X ___: 3   After initial evaluation and assessments completed: Virtual Based Care may be provided alone or in conjunction with Phase 2 Cardiac Rehab based on patient barriers.: Yes   Intensive Cardiac Rehabilitation ([place]) [facility] location only OR Traditional Cardiac  Rehabilitation (TCR) ***If criteria for ICR are not met will enroll in TCR [facility] only): Yes      Allergies as of 03/28/2022       Reactions   Esomeprazole Magnesium Other (See Comments)   REACTION: abdominal pain   Simvastatin Other (See Comments)   Wasn't working for pt   Alum Hydroxide-mag Carbonate Other (See Comments)   Medication interaction only     Med Rec must be completed prior to using this [facility]***       Follow-up Information     [facility] [place] Follow up.   Specialty: Cardiothoracic Surgery Why: Follow up appointment. Arrive for PA/lat CXR at ***, 30 minutes before appointment (at [place], on the first floor of the same building of Dr. Nicholos Johns office) appointemnt time is at [employer] information: [place],  [place] [phone]        Ledora Bottcher, [place] Follow up on 04/21/2022.   Specialties: Physician Assistant, Cardiology, Radiology Why: Cardiology follow up appointment at 10:55am Contact information: [place] [address] [phone]                 Signed:  Magdalene River, PA-C  03/28/2022, 11:21 AM

## 2022-03-27 NOTE — Progress Notes (Signed)
EVENING ROUNDS NOTE :     [place]       [place] [address]             [phone]                 1 Day Post-Op Procedure(s) (LRB): CORONARY ARTERY BYPASS GRAFTING (CABG) X THREE, USING LEFT INTERNAL MAMMARY ARTERY AND RIGHT LEG GREATER SAPHENOUS VEIN (N/A) TRANSESOPHAGEAL ECHOCARDIOGRAM (TEE) (N/A)   Total Length of Stay:  LOS: 6 days  Events:   No events Resting comfortably    BP 119/71   Pulse 74   Temp 98.8 F (37.1 C)   Resp 13   Ht '6\' 2"'$  (1.88 m)   Wt 101.4 kg   SpO2 99%   BMI 28.70 kg/m   PAP: (21-50)/(8-33) 21/9 CO:  [5.2 L/min-7.4 L/min] 6.9 L/min CI:  [2.3 L/min/m2-3.3 L/min/m2] 3.1 L/min/m2      sodium chloride Stopped (03/27/22 1025)   sodium chloride Stopped (03/27/22 1022)   sodium chloride 20 mL/hr at 03/27/22 1100    ceFAZolin (ANCEF) IV 2 g (03/27/22 1405)   dexmedetomidine (PRECEDEX) IV infusion 0.6 mcg/kg/hr (03/27/22 1100)   insulin Stopped (03/27/22 1201)   lactated ringers     lactated ringers     lactated ringers 10 mL/hr at 03/27/22 1100   nitroGLYCERIN Stopped (03/27/22 1038)    I/O last 3 completed shifts: In: 6054.7 [P.O.:80; I.V.:3381.8; Blood:305; IV Piggyback:2287.9] Out: 0254 [Urine:2450; Blood:458; Chest Tube:560]      Latest Ref Rng & Units 03/27/2022    4:33 AM 03/26/2022    7:08 PM 03/26/2022    7:01 PM  CBC  WBC 4.0 - 10.5 K/uL 9.8   13.3   Hemoglobin 13.0 - 17.0 g/dL 11.6  11.9  13.0   Hematocrit 39.0 - 52.0 % 31.0  35.0  35.3   Platelets 150 - 400 K/uL 124   138        Latest Ref Rng & Units 03/27/2022    4:33 AM 03/26/2022    7:08 PM 03/26/2022    7:01 PM  BMP  Glucose 70 - 99 mg/dL 119   130   BUN 8 - 23 mg/dL 12   10   Creatinine 0.61 - 1.24 mg/dL 0.86   0.84   Sodium 135 - 145 mmol/L 136  136  137   Potassium 3.5 - 5.1 mmol/L 4.6  4.2  4.3   Chloride 98 - 111 mmol/L 105   106   CO2 22 - 32 mmol/L 24   24   Calcium 8.9 - 10.3 mg/dL 8.0   7.9     ABG    Component Value Date/Time    PHART 7.295 (L) 03/26/2022 1908   PCO2ART 49.8 (H) 03/26/2022 1908   PO2ART 93 03/26/2022 1908   HCO3 24.0 03/26/2022 1908   TCO2 25 03/26/2022 1908   ACIDBASEDEF 3.0 (H) 03/26/2022 1908   O2SAT 96 03/26/2022 1908       Melodie Bouillon, MD 03/27/2022 5:21 PM

## 2022-03-27 NOTE — Progress Notes (Signed)
NAME:  Wayne Vang, MRN:  [id number], DOB:  1961/01/25, LOS: 6 ADMISSION DATE:  03/19/2022, CONSULTATION DATE:  03/26/22 REFERRING MD:  Wayne Vang, CHIEF COMPLAINT:  post-CABG   History of Present Illness:  Mr. Wayne Vang is a 61 y/o gentleman with a history of CAD and seizures who presented on 8/30 with NSTEMI and underwent LHC on 8/31 with severe 3 vessel CAD. He underwent CABG on 9/  with LIMA to the LAD and RSVG from the aorta to the Ramus coronary artery sequenced to the OM1. He was transferred to the ICU with atrial pacing, on amiodarone and neosynephrine. He is waking up quickly post-operatively. Cardiac risk factors include family history of CAD, HLD, former tobacco abuse.   Pertinent  Medical History  Seizures GERD HH HLD  Significant Hospital Events: Including procedures, antibiotic start and stop dates in addition to other pertinent events   8/30 admission 9/6 CABG  Interim History / Subjective:    Objective   Blood pressure 119/71, pulse 74, temperature 98.8 F (37.1 C), resp. rate 13, height '6\' 2"'$  (1.88 m), weight 101.4 kg, SpO2 99 %. PAP: (21-50)/(8-33) 21/9 CO:  [4.9 L/min-7.4 L/min] 6.9 L/min CI:  [2.2 L/min/m2-3.3 L/min/m2] 3.1 L/min/m2  FiO2 (%):  [40 %] 40 %   Intake/Output Summary (Last 24 hours) at 03/27/2022 1549 Last data filed at 03/27/2022 1300 Gross per 24 hour  Intake 2740.69 ml  Output 1786 ml  Net 954.69 ml    Filed Weights   03/19/22 1546 03/19/22 2200 03/27/22 0500  Weight: 106.6 kg 100 kg 101.4 kg    Examination: General:  middle aged man lying in bed in NAD HENT: [place]/AT, eyes anicteric Lungs: breathing comfortably on [place], reduced basilar breath sounds, no rhales or wheezing Cardiovascular: S1S2, RRR Abdomen: soft, NT Extremities: mild hand and pedal edema, no cyanosis Neuro: Awake, alert, answering questions appropriately. Moving all extremities.  GU: foley with clear urine  BUN 12 Cr 0.86 H/H 11.6/31 Platelets 124 CXR personally  reviewed: swan, chest tubes. Basilar atelectasis, small left effusion.   Resolved Hospital Problem list     Assessment & Plan:  Coronary artery disease with NSTEMI, status post CABG x3 -pain control- oxycodone & fentanyl -adding gabapentin for pain -DAPT, metoprolol, statin -complete post-op antibiotics today -transition off insulin gtt to basal-bolus -tele monitoring -off amiodarone -wean off oxygen, pulmonary hygiene -mobilize  Acute anemia-expected operative blood loss  -monitor, transfuse for Hb <8 or hemodynamically significant bleeding  Hyperlipidemia - con't statin  History of tobacco abuse, quit about 20 years ago - No indication for lung cancer screening given duration since he quit smoking  History of seizure disorder - con't PTA dilantin -no tramadol or robaxin due to  lower seizure threshold  Best Practice (right click and "Reselect all SmartList Selections" daily)   Diet/type: Regular consistency (see orders) DVT prophylaxis: SCD GI prophylaxis: N/A Lines: Central line, Arterial Line, and yes and it is still needed Foley:  Yes, and it is no longer needed and removal ordered  Code Status:  full code Last date of multidisciplinary goals of care discussion '[ ]'$   Labs   CBC: Recent Labs  Lab 03/25/22 0337 03/26/22 0422 03/26/22 0857 03/26/22 1110 03/26/22 1130 03/26/22 1320 03/26/22 1530 03/26/22 1717 03/26/22 1901 03/26/22 1908 03/27/22 0433  WBC 5.6 6.0  --   --   --  10.5  --   --  13.3*  --  9.8  HGB 16.2 15.9   < > 11.0*   < >  12.6* 11.6* 11.6* 13.0 11.9* 11.6*  HCT 43.3 44.3   < > 29.2*   < > 34.7* 34.0* 34.0* 35.3* 35.0* 31.0*  MCV 86.3 87.7  --   --   --  87.0  --   --  86.9  --  86.6  PLT 171 173  --  135*  --  110*  --   --  138*  --  124*   < > = values in this interval not displayed.        Critical care time:      Wayne Hy, DO 03/27/22 3:49 PM [facility]

## 2022-03-27 NOTE — Progress Notes (Signed)
Follow up - Critical Care Medicine Note  Patient Details:    Wayne Vang is an 61 y.o. male.   Subjective:    Overnight Issues: Has done well since extubation. Hemodynamics stable. Chest tube output with 300cc overnight. Cxr looks good. Major issue currently is pain control.  Objective:  Vital signs for last 24 hours: Temp:  [97.2 F (36.2 C)-100.8 F (38.2 C)] 98.6 F (37 C) (09/07 0900) Pulse Rate:  [66-191] 72 (09/07 0900) Resp:  [10-29] 11 (09/07 0800) BP: (92-132)/(59-87) 119/67 (09/07 0900) SpO2:  [88 %-100 %] 99 % (09/07 0900) Arterial Line BP: (79-157)/(44-83) 155/60 (09/07 0900) FiO2 (%):  [40 %-50 %] 40 % (09/06 1600) Weight:  [101.4 kg] 101.4 kg (09/07 0500)  Hemodynamic parameters for last 24 hours: PAP: (21-50)/(8-33) 23/8 CO:  [4 L/min-7.4 L/min] 6.9 L/min CI:  [1.8 L/min/m2-3.3 L/min/m2] 3.1 L/min/m2  Intake/Output from previous day: 09/06 0701 - 09/07 0700 In: 5716.3 [P.O.:80; I.V.:3043.4; Blood:305; IV Piggyback:2287.9] Out: 3329 [Urine:2450; Blood:458; Chest Tube:560]  Intake/Output this shift: Total I/O In: 191.2 [I.V.:182.2; IV Piggyback:9] Out: 120 [Urine:120]  Vent settings for last 24 hours: Vent Mode: PSV;CPAP FiO2 (%):  [40 %-50 %] 40 % Set Rate:  [4 bmp-12 bmp] 4 bmp Vt Set:  [650 mL] 650 mL PEEP:  [5 cmH20] 5 cmH20 Pressure Support:  [5 cmH20-10 cmH20] 5 cmH20 Plateau Pressure:  [17 cmH20] 17 cmH20  Physical Exam:  Resp: clear to auscultation bilaterally Cardiac: RR with rub Assessment/Plan:    LOS: 6 days   Additional comments: Doing well. Will start BB at '25mg'$  bid and wean off NTG. Leave Cts for now and reassess early afternoon for removal if around 100cc. Start Plavix and leave in ICU today for pain management and respitory support. Add Gabepentin for additional pain control.   Critical Care Total Time*: 15 Minutes  [name] 03/27/2022  *Care during the described time interval was provided by me and/or other providers  on the critical care team.  I have reviewed this patient's available data, including medical history, events of note, physical examination and test results as part of my evaluation.

## 2022-03-27 NOTE — Discharge Instructions (Signed)

## 2022-03-28 ENCOUNTER — Inpatient Hospital Stay (HOSPITAL_COMMUNITY): Payer: BC Managed Care – PPO

## 2022-03-28 ENCOUNTER — Ambulatory Visit: Payer: BC Managed Care – PPO

## 2022-03-28 DIAGNOSIS — Z951 Presence of aortocoronary bypass graft: Secondary | ICD-10-CM | POA: Diagnosis not present

## 2022-03-28 DIAGNOSIS — R739 Hyperglycemia, unspecified: Secondary | ICD-10-CM | POA: Diagnosis not present

## 2022-03-28 DIAGNOSIS — D649 Anemia, unspecified: Secondary | ICD-10-CM | POA: Diagnosis not present

## 2022-03-28 DIAGNOSIS — I214 Non-ST elevation (NSTEMI) myocardial infarction: Secondary | ICD-10-CM | POA: Diagnosis not present

## 2022-03-28 LAB — CBC
HCT: 31.2 % — ABNORMAL LOW (ref 39.0–52.0)
Hemoglobin: 11.2 g/dL — ABNORMAL LOW (ref 13.0–17.0)
MCH: 32.1 pg (ref 26.0–34.0)
MCHC: 35.9 g/dL (ref 30.0–36.0)
MCV: 89.4 fL (ref 80.0–100.0)
Platelets: 114 10*3/uL — ABNORMAL LOW (ref 150–400)
RBC: 3.49 MIL/uL — ABNORMAL LOW (ref 4.22–5.81)
RDW: 12.3 % (ref 11.5–15.5)
WBC: 9.1 10*3/uL (ref 4.0–10.5)
nRBC: 0 % (ref 0.0–0.2)

## 2022-03-28 LAB — BASIC METABOLIC PANEL
Anion gap: 4 — ABNORMAL LOW (ref 5–15)
BUN: 12 mg/dL (ref 8–23)
CO2: 28 mmol/L (ref 22–32)
Calcium: 8.2 mg/dL — ABNORMAL LOW (ref 8.9–10.3)
Chloride: 107 mmol/L (ref 98–111)
Creatinine, Ser: 0.98 mg/dL (ref 0.61–1.24)
GFR, Estimated: >60 mL/min (ref >60)
Glucose, Bld: 116 mg/dL — ABNORMAL HIGH (ref 70–99)
Potassium: 4.2 mmol/L (ref 3.5–5.1)
Sodium: 139 mmol/L (ref 135–145)

## 2022-03-28 LAB — GLUCOSE, CAPILLARY
Glucose-Capillary: 104 mg/dL — ABNORMAL HIGH (ref 70–99)
Glucose-Capillary: 111 mg/dL — ABNORMAL HIGH (ref 70–99)
Glucose-Capillary: 114 mg/dL — ABNORMAL HIGH (ref 70–99)
Glucose-Capillary: 129 mg/dL — ABNORMAL HIGH (ref 70–99)
Glucose-Capillary: 129 mg/dL — ABNORMAL HIGH (ref 70–99)
Glucose-Capillary: 96 mg/dL (ref 70–99)
Glucose-Capillary: 98 mg/dL (ref 70–99)

## 2022-03-28 MED ORDER — INSULIN ASPART 100 UNIT/ML IJ SOLN
0.0000 [IU] | Freq: Three times a day (TID) | INTRAMUSCULAR | Status: DC
  Administered 2022-03-29 (×2): 2 [IU] via SUBCUTANEOUS

## 2022-03-28 MED ORDER — AMIODARONE HCL 200 MG PO TABS
400.0000 mg | ORAL_TABLET | Freq: Two times a day (BID) | ORAL | Status: DC
  Administered 2022-03-28 – 2022-03-30 (×5): 400 mg via ORAL
  Filled 2022-03-28 (×5): qty 2

## 2022-03-28 MED ORDER — FUROSEMIDE 40 MG PO TABS
40.0000 mg | ORAL_TABLET | Freq: Every day | ORAL | Status: DC
  Administered 2022-03-28 – 2022-03-30 (×3): 40 mg via ORAL
  Filled 2022-03-28 (×3): qty 1

## 2022-03-28 MED ORDER — ENOXAPARIN SODIUM 40 MG/0.4ML IJ SOSY
40.0000 mg | PREFILLED_SYRINGE | INTRAMUSCULAR | Status: DC
  Administered 2022-03-28 – 2022-03-29 (×2): 40 mg via SUBCUTANEOUS
  Filled 2022-03-28 (×2): qty 0.4

## 2022-03-28 MED ORDER — SIMETHICONE 80 MG PO CHEW
80.0000 mg | CHEWABLE_TABLET | Freq: Four times a day (QID) | ORAL | Status: DC | PRN
  Administered 2022-03-28: 80 mg via ORAL
  Filled 2022-03-28: qty 1

## 2022-03-28 MED ORDER — POTASSIUM CHLORIDE CRYS ER 20 MEQ PO TBCR
20.0000 meq | EXTENDED_RELEASE_TABLET | Freq: Every day | ORAL | Status: DC
  Administered 2022-03-28 – 2022-03-30 (×3): 20 meq via ORAL
  Filled 2022-03-28 (×3): qty 1

## 2022-03-28 MED ORDER — ASPIRIN 81 MG PO TBEC
81.0000 mg | DELAYED_RELEASE_TABLET | Freq: Every day | ORAL | Status: DC
  Administered 2022-03-29 – 2022-03-30 (×2): 81 mg via ORAL
  Filled 2022-03-28 (×2): qty 1

## 2022-03-28 NOTE — Progress Notes (Signed)
      [address]       [place] [address]             [phone]      POD # 2 CABG  Resting comfortably  BP 136/75   Pulse 96   Temp 99 F (37.2 C) (Oral)   Resp 17   Ht '6\' 2"'$  (1.88 m)   Wt 101.2 kg   SpO2 94%   BMI 28.63 kg/m   Intake/Output Summary (Last 24 hours) at 03/28/2022 1722 Last data filed at 03/28/2022 1600 Gross per 24 hour  Intake 571.81 ml  Output 3920 ml  Net -3348.19 ml    CBG well controlled  Awaiting transfer to 4E  Remo Lipps C. Roxan Hockey, MD [facility] [phone]

## 2022-03-28 NOTE — TOC Initial Note (Signed)
Transition of Care [facility]) - Initial/Assessment Note    Patient Details  Name: Wayne Vang MRN: [id number] Date of Birth: 1961/06/10  [facility]) CM/SW Contact:    Bethena Roys, RN Phone Number: 03/28/2022, 3:51 PM  Clinical Narrative:  Patient was discussed in progression rounds this morning. Patient is POD-2 CABG, CT's to be pulled today. Case Manager will continue to follow the patient for transition of care needs as he progresses.           Expected Discharge Plan: Home/Self Care Barriers to Discharge: Continued Medical Work up   Expected Discharge Plan and Services Expected Discharge Plan: Home/Self Care In-house Referral: NA Discharge Planning Services: CM Consult   Living arrangements for the past 2 months: Single Family Home                   Prior Living Arrangements/Services Living arrangements for the past 2 months: Single Family Home Lives with:: Spouse Patient language and need for interpreter reviewed:: Yes        Need for Family Participation in Patient Care: Yes (Comment) Care giver support system in place?: Yes (comment)   Criminal Activity/Legal Involvement Pertinent to Current Situation/Hospitalization: No - Comment as needed  Activities of Daily Living Home Assistive Devices/Equipment: None ADL Screening (condition at time of admission) Patient's cognitive ability adequate to safely complete daily activities?: Yes Is the patient deaf or have difficulty hearing?: No Does the patient have difficulty seeing, even when wearing glasses/contacts?: No Does the patient have difficulty concentrating, remembering, or making decisions?: No Patient able to express need for assistance with ADLs?: Yes Does the patient have difficulty dressing or bathing?: No Independently performs ADLs?: Yes (appropriate for developmental age) Does the patient have difficulty walking or climbing stairs?: No Weakness of Legs: None Weakness of Arms/Hands:  None  Permission Sought/Granted Permission sought to share information with : Case Manager, Family Supports        Alcohol / Substance Use: Not Applicable Psych Involvement: No (comment)  Admission diagnosis:  Unstable angina pectoris ([place]) [I20.0] NSTEMI (non-ST elevated myocardial infarction) ([place]) [I21.4] S/P CABG x 3 [Z95.1] Patient Active Problem List   Diagnosis Date Noted   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   NSTEMI (non-ST elevated myocardial infarction) ([place]) 03/19/2022   Seizures ([place])    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 LOVE) 07/27/2008   GERD 04/21/2007   PCP:  Mcconnon Loffler, MD Pharmacy:   [id number] [address]Phone: [phone]: [phone] [facility] [address]Phone: [phone]: [phone]  Readmission Risk Interventions     No data to display

## 2022-03-28 NOTE — Progress Notes (Signed)
Primary cardiologist:  Gilman Schmidt  Subjective:  Getting Chest tube and right IJ out   Objective:  Vitals:   03/28/22 1000 03/28/22 1100 03/28/22 1119 03/28/22 1200  BP: 124/69 114/65  98/79  Pulse: 95 77  86  Resp: '12 11  18  '$ Temp:   99.4 F (37.4 C)   TempSrc:   Oral   SpO2: 99% 97%  94%  Weight:      Height:        Intake/Output from previous day:  Intake/Output Summary (Last 24 hours) at 03/28/2022 1303 Last data filed at 03/28/2022 1300 Gross per 24 hour  Intake 571.81 ml  Output 3920 ml  Net -3348.19 ml    Physical Exam: Right  IJ Post sternotomy Foley CT out  Right SVG harvest    Lab Results: Basic Metabolic Panel: Recent Labs    03/27/22 0433 03/27/22 1702 03/28/22 0453  NA 136 138 139  K 4.6 4.4 4.2  CL 105 104 107  CO2 '24 27 28  '$ GLUCOSE 119* 189* 116*  BUN '12 14 12  '$ CREATININE 0.86 1.14 0.98  CALCIUM 8.0* 8.2* 8.2*  MG 2.3 2.2  --    Liver Function Tests: No results for input(s): "AST", "ALT", "ALKPHOS", "BILITOT", "PROT", "ALBUMIN" in the last 72 hours. No results for input(s): "LIPASE", "AMYLASE" in the last 72 hours. CBC: Recent Labs    03/27/22 1702 03/28/22 0453  WBC 9.4 9.1  HGB 12.1* 11.2*  HCT 33.0* 31.2*  MCV 88.2 89.4  PLT 126* 114*     Imaging: DG Chest Port 1 View  Result Date: 03/28/2022 CLINICAL DATA:  Status post CABG EXAM: PORTABLE CHEST 1 VIEW COMPARISON:  Chest x-ray dated March 27, 2022 FINDINGS: Interval removal of PA catheter with right IJ line sheath remaining in place. Mediastinal drains and left-sided chest tube in place. Left-greater-than-right bibasilar atelectasis and trace left pleural effusion. No evidence of pneumothorax. IMPRESSION: Left-greater-than-right bibasilar atelectasis and trace left pleural effusion. Electronically Signed   By: Yetta Glassman M.D.   On: 03/28/2022 08:10   DG Chest Port 1 View  Result Date: 03/27/2022 CLINICAL DATA:  Status post coronary artery bypass surgery EXAM: PORTABLE  CHEST 1 VIEW COMPARISON:  Previous studies including the examination of 03/26/2022 FINDINGS: There is interval removal of endotracheal tube and enteric tube. Tip of the Swan-Ganz catheter is seen in the region of main pulmonary artery. Mediastinal drains and left chest tube appear stable. There is poor inspiration. There are linear densities in left lower lung field suggesting subsegmental atelectasis. Left lateral CP angle is indistinct. There is no pneumothorax. IMPRESSION: Linear densities in left lower lung fields suggest subsegmental atelectasis. Possible small left pleural effusion. Electronically Signed   By: Elmer Picker M.D.   On: 03/27/2022 08:19   DG Chest Port 1 View  Result Date: 03/26/2022 CLINICAL DATA:  Bypass surgery. EXAM: PORTABLE CHEST 1 VIEW COMPARISON:  03/21/2022 FINDINGS: The endotracheal tube is 4.5 cm above the carina. The right IJ Swan-Ganz catheter tip is in the proximal right pulmonary artery. The NG tube is coursing down the esophagus and into the stomach. Left-sided chest tube and mediastinal drain tubes are in place. Streaky bibasilar atelectasis, left greater than right. No pleural effusions, pulmonary edema or pneumothorax. IMPRESSION: 1. Support apparatus in good position without complicating features. 2. Bibasilar atelectasis. Electronically Signed   By: Marijo Sanes M.D.   On: 03/26/2022 13:22    Cardiac Studies:  ECG: SR normal 03/27/22  Telemetry:  NSR   Echo: Intra Op TEE EF 50-55%   Medications:    acetaminophen  1,000 mg Oral Q6H   Or   acetaminophen (TYLENOL) oral liquid 160 mg/5 mL  1,000 mg Per Tube Q6H   amiodarone  400 mg Oral BID   [START ON 03/29/2022] aspirin EC  81 mg Oral Daily   bisacodyl  10 mg Oral Daily   Or   bisacodyl  10 mg Rectal Daily   Chlorhexidine Gluconate Cloth  6 each Topical Daily   clopidogrel  75 mg Oral Daily   docusate sodium  200 mg Oral Daily   enoxaparin (LOVENOX) injection  40 mg Subcutaneous Q24H   furosemide   40 mg Oral Daily   gabapentin  300 mg Oral BID   insulin aspart  0-24 Units Subcutaneous TID WC   metoprolol tartrate  25 mg Oral BID   Or   metoprolol tartrate  25 mg Per Tube BID   phenytoin  200 mg Oral q morning   phenytoin  300 mg Oral q1800   potassium chloride  20 mEq Oral Daily   rosuvastatin  40 mg Oral Daily   sodium chloride flush  10-40 mL Intracatheter Q12H   sodium chloride flush  3 mL Intravenous Q12H      sodium chloride Stopped (03/27/22 1025)   sodium chloride Stopped (03/27/22 1022)   sodium chloride 20 mL/hr at 03/28/22 1100   lactated ringers     lactated ringers     lactated ringers Stopped (03/28/22 0749)    Assessment/Plan:   2 days post CABG. Off drips lines/chest tube out rhythm stable Hct 31.2 EF 50-55% LIMA to LAD SVG to Ramus/OM right SVG harvest  Transfer to floor Doing well   Jenkins Rouge 03/28/2022, 1:03 PM

## 2022-03-28 NOTE — Progress Notes (Signed)
NAME:  Wayne Vang, MRN:  [id number], DOB:  22-Mar-1961, LOS: 7 ADMISSION DATE:  03/19/2022, CONSULTATION DATE:  03/26/22 REFERRING MD:  Yolanda Manges, CHIEF COMPLAINT:  post-CABG   History of Present Illness:  Mr. Havey is a 61 y/o gentleman with a history of CAD and seizures who presented on 8/30 with NSTEMI and underwent LHC on 8/31 with severe 3 vessel CAD. He underwent CABG on 9/  with LIMA to the LAD and RSVG from the aorta to the Ramus coronary artery sequenced to the OM1. He was transferred to the ICU with atrial pacing, on amiodarone and neosynephrine. He is waking up quickly post-operatively. Cardiac risk factors include family history of CAD, HLD, former tobacco abuse.   Pertinent  Medical History  Seizures GERD HH HLD  Significant Hospital Events: Including procedures, antibiotic start and stop dates in addition to other pertinent events   8/30 admission 9/6 CABG  Interim History / Subjective:  Yesterday had one episode of nausea when walking, otherwise is doing well. Still having significant pain from chest tubes.   Objective   Blood pressure 112/66, pulse 87, temperature 98.7 F (37.1 C), temperature source Oral, resp. rate 11, height '6\' 2"'$  (1.88 m), weight 101.2 kg, SpO2 94 %. PAP: (21-25)/(8-9) 21/9      Intake/Output Summary (Last 24 hours) at 03/28/2022 0716 Last data filed at 03/28/2022 0630 Gross per 24 hour  Intake 644.53 ml  Output 3851 ml  Net -3206.47 ml    Filed Weights   03/19/22 2200 03/27/22 0500 03/28/22 0500  Weight: 100 kg 101.4 kg 101.2 kg    Examination: General: Middle-age man sitting up in recliner in no acute distress HENT: [place]/AT, eyes anicteric Lungs: Breathing comfortably on nasal cannula, reduced bibasilar breath sounds.  No wheezing or rhonchi.  No conversational dyspnea. Cardiovascular: S1S2, regular rate and rhythm Abdomen: Soft, nontender Extremities: Minimal edema, no cyanosis Neuro: Awake, alert, normal speech, answering  questions appropriately.  Moving all extremities. GU: Foley with amber urine  BUN 12 Cr 0.98 H/H 11.2/31.2 Platelets 114 CXR personally reviewed: RIJ CVC, small residual left dependent effusion. Left chest tube.  Resolved Hospital Problem list     Assessment & Plan:  Coronary artery disease with NSTEMI, status post CABG x3 - Continue pain control oxycodone and fentanyl PRN, gabapentin - Continue to antiplatelet therapy, statin, metoprolol -Continue pulmonary hygiene and weaning supplemental oxygen - Continue mobility as able - Pulling chest tubes today.  Acute anemia-expected operative blood loss  Consumptive thrombocytopenia-expected postoperatively -Transfuse for hemoglobin less than 8 or hemodynamically significant bleeding.  Hyperlipidemia -Continue statin  Hyperglycemia - SSI PRN - Goal blood glucose less than 180  History of tobacco abuse, quit about 20 years ago - No indication for lung cancer screening given duration since he quit smoking  History of seizure disorder - con't PTA dilantin -no tramadol or robaxin due to lower seizure threshold with these medications  Best Practice (right click and "Reselect all SmartList Selections" daily)   Diet/type: Regular consistency (see orders) DVT prophylaxis: SCD GI prophylaxis: N/A Lines: Central line and yes and it is still needed Foley:  Yes, and it is no longer needed and removal ordered  Code Status:  full code Last date of multidisciplinary goals of care discussion '[ ]'$   Labs   CBC: Recent Labs  Lab 03/26/22 1320 03/26/22 1530 03/26/22 1901 03/26/22 1908 03/27/22 0433 03/27/22 1702 03/28/22 0453  WBC 10.5  --  13.3*  --  9.8 9.4 9.1  HGB  12.6*   < > 13.0 11.9* 11.6* 12.1* 11.2*  HCT 34.7*   < > 35.3* 35.0* 31.0* 33.0* 31.2*  MCV 87.0  --  86.9  --  86.6 88.2 89.4  PLT 110*  --  138*  --  124* 126* 114*   < > = values in this interval not displayed.       Julian Hy, DO 03/28/22 11:38  [facility]

## 2022-03-28 NOTE — Progress Notes (Addendum)
      [address]       [place] [address]             [phone]      2 Days Post-Op Procedure(s) (LRB): CORONARY ARTERY BYPASS GRAFTING (CABG) X THREE, USING LEFT INTERNAL MAMMARY ARTERY AND RIGHT LEG GREATER SAPHENOUS VEIN (N/A) TRANSESOPHAGEAL ECHOCARDIOGRAM (TEE) (N/A)  Subjective:  Patient sitting up in chair.  States he didn't realize just eating breakfast could tire him out so much.  He continues to have pain, states pain medication last about 4 hours.  + ambulation  No BM  Objective: Vital signs in last 24 hours: Temp:  [98.6 F (37 C)-98.8 F (37.1 C)] 98.7 F (37.1 C) (09/08 0400) Pulse Rate:  [65-93] 87 (09/08 0600) Cardiac Rhythm: Normal sinus rhythm (09/08 0400) Resp:  [10-25] 11 (09/08 0600) BP: (104-148)/(65-84) 112/66 (09/08 0600) SpO2:  [94 %-100 %] 94 % (09/08 0600) Arterial Line BP: (125-155)/(59-67) 125/59 (09/07 1200) Weight:  [101.2 kg] 101.2 kg (09/08 0500)  Hemodynamic parameters for last 24 hours: PAP: (21-25)/(8-9) 21/9  Intake/Output from previous day: 09/07 0701 - 09/08 0700 In: 644.5 [I.V.:435.5; IV Piggyback:209] Out: 9509 [Urine:3545; Chest Tube:306]  General appearance: alert, cooperative, and no distress Heart: regular rate and rhythm Lungs: mildly diminished left base, otherwise CTA bilaterally Abdomen: soft, non-tender; bowel sounds normal; no masses,  no organomegaly Extremities: edema trace Wound: aquacel on sternotomy, EVH site C/D/I, ecchymosis RLE  Lab Results: Recent Labs    03/27/22 1702 03/28/22 0453  WBC 9.4 9.1  HGB 12.1* 11.2*  HCT 33.0* 31.2*  PLT 126* 114*   BMET:  Recent Labs    03/27/22 1702 03/28/22 0453  NA 138 139  K 4.4 4.2  CL 104 107  CO2 27 28  GLUCOSE 189* 116*  BUN 14 12  CREATININE 1.14 0.98  CALCIUM 8.2* 8.2*    PT/INR:  Recent Labs    03/26/22 1320  LABPROT 17.6*  INR 1.5*   ABG    Component Value Date/Time   PHART 7.295 (L) 03/26/2022 1908   HCO3 24.0  03/26/2022 1908   TCO2 25 03/26/2022 1908   ACIDBASEDEF 3.0 (H) 03/26/2022 1908   O2SAT 96 03/26/2022 1908   CBG (last 3)  Recent Labs    03/28/22 0050 03/28/22 0317 03/28/22 0644  GLUCAP 98 129* 129*    Assessment/Plan: S/P Procedure(s) (LRB): CORONARY ARTERY BYPASS GRAFTING (CABG) X THREE, USING LEFT INTERNAL MAMMARY ARTERY AND RIGHT LEG GREATER SAPHENOUS VEIN (N/A) TRANSESOPHAGEAL ECHOCARDIOGRAM (TEE) (N/A)  CV- NSR, BP well controlled- continue Lopressor 25 mg BID, will add Amiodarone 400 mg BID x 3 days, Plavix Pulm- aggressive IS use, CXR with trace left pleural effusion, atelectasis bilaterally.. CT output 110 cc overnight.. can likely remove today will discuss with Dr. Sherryle Lis Renal- creatinine remains stable at 0.98, + mild edema on exam, will start Lasix 40 mg daily, with 20 meq potassium Expected post operative blood loss anemia, mild Hgb at 11.2 Expected post operative thrombocytopenia, mild at 114 will monitor CBGs- stable, will transition to SSIP to TID HS Dispo- patient doing well overall, discuss chest tube removal with Dr. Lavonna Monarch, d/c foley catheter, possibly transfer to 4E today  LOS: 7 days    Ellwood Handler, PA-C  03/28/2022  Agree with above.  Will remove pacing wires and chest tubes.  Ambulate and transfer later today to floor Diurese and follow sternal discomfort

## 2022-03-29 ENCOUNTER — Inpatient Hospital Stay (HOSPITAL_COMMUNITY): Payer: BC Managed Care – PPO

## 2022-03-29 LAB — CBC
HCT: 32.4 % — ABNORMAL LOW (ref 39.0–52.0)
Hemoglobin: 11.8 g/dL — ABNORMAL LOW (ref 13.0–17.0)
MCH: 32.2 pg (ref 26.0–34.0)
MCHC: 36.4 g/dL — ABNORMAL HIGH (ref 30.0–36.0)
MCV: 88.3 fL (ref 80.0–100.0)
Platelets: 133 10*3/uL — ABNORMAL LOW (ref 150–400)
RBC: 3.67 MIL/uL — ABNORMAL LOW (ref 4.22–5.81)
RDW: 12 % (ref 11.5–15.5)
WBC: 8.6 10*3/uL (ref 4.0–10.5)
nRBC: 0 % (ref 0.0–0.2)

## 2022-03-29 LAB — BASIC METABOLIC PANEL
Anion gap: 10 (ref 5–15)
BUN: 13 mg/dL (ref 8–23)
CO2: 26 mmol/L (ref 22–32)
Calcium: 8.6 mg/dL — ABNORMAL LOW (ref 8.9–10.3)
Chloride: 102 mmol/L (ref 98–111)
Creatinine, Ser: 0.92 mg/dL (ref 0.61–1.24)
GFR, Estimated: >60 mL/min (ref >60)
Glucose, Bld: 103 mg/dL — ABNORMAL HIGH (ref 70–99)
Potassium: 4.2 mmol/L (ref 3.5–5.1)
Sodium: 138 mmol/L (ref 135–145)

## 2022-03-29 LAB — GLUCOSE, CAPILLARY
Glucose-Capillary: 155 mg/dL — ABNORMAL HIGH (ref 70–99)
Glucose-Capillary: 94 mg/dL (ref 70–99)
Glucose-Capillary: 141 mg/dL — ABNORMAL HIGH (ref 70–99)
Glucose-Capillary: 98 mg/dL (ref 70–99)

## 2022-03-29 NOTE — Progress Notes (Signed)
3 Days Post-Op Procedure(s) (LRB): CORONARY ARTERY BYPASS GRAFTING (CABG) X THREE, USING LEFT INTERNAL MAMMARY ARTERY AND RIGHT LEG GREATER SAPHENOUS VEIN (N/A) TRANSESOPHAGEAL ECHOCARDIOGRAM (TEE) (N/A) Subjective: Doesn't feel as well this AM as he did yesterday, Some pain earlier, now relieved  Objective: Vital signs in last 24 hours: Temp:  [98 F (36.7 C)-99.6 F (37.6 C)] 98 F (36.7 C) (09/09 0755) Pulse Rate:  [77-100] 94 (09/09 0900) Cardiac Rhythm: Normal sinus rhythm (09/09 0800) Resp:  [10-25] 12 (09/09 0900) BP: (98-136)/(65-94) 122/83 (09/09 0900) SpO2:  [86 %-97 %] 86 % (09/09 0900) Weight:  [98.8 kg] 98.8 kg (09/09 0500)  Hemodynamic parameters for last 24 hours:    Intake/Output from previous day: 09/08 0701 - 09/09 0700 In: 26.2 [I.V.:26.2] Out: 1800 [Urine:1800] Intake/Output this shift: No intake/output data recorded.  General appearance: alert, cooperative, and no distress Neurologic: intact Heart: regular rate and rhythm Lungs: diminished breath sounds bibasilar Abdomen: normal findings: soft, non-tender  Lab Results: Recent Labs    03/28/22 0453 03/29/22 0428  WBC 9.1 8.6  HGB 11.2* 11.8*  HCT 31.2* 32.4*  PLT 114* 133*   BMET:  Recent Labs    03/28/22 0453 03/29/22 0428  NA 139 138  K 4.2 4.2  CL 107 102  CO2 28 26  GLUCOSE 116* 103*  BUN 12 13  CREATININE 0.98 0.92  CALCIUM 8.2* 8.6*    PT/INR:  Recent Labs    03/26/22 1320  LABPROT 17.6*  INR 1.5*   ABG    Component Value Date/Time   PHART 7.295 (L) 03/26/2022 1908   HCO3 24.0 03/26/2022 1908   TCO2 25 03/26/2022 1908   ACIDBASEDEF 3.0 (H) 03/26/2022 1908   O2SAT 96 03/26/2022 1908   CBG (last 3)  Recent Labs    03/28/22 2220 03/28/22 2338 03/29/22 0759  GLUCAP 111* 96 155*    Assessment/Plan: S/P Procedure(s) (LRB): CORONARY ARTERY BYPASS GRAFTING (CABG) X THREE, USING LEFT INTERNAL MAMMARY ARTERY AND RIGHT LEG GREATER SAPHENOUS VEIN  (N/A) TRANSESOPHAGEAL ECHOCARDIOGRAM (TEE) (N/A) POD # 2 overall doing well NEURO- intact CV- in SR  ASA, statin, beta blocker RESP_ continue IS RENAL- creatinine normal and lytes OK ENDO- CBG mildly elevated Gi - tolerating PO SCD + enoxaparin Cardiac rehab Awaiting bed on 4E    LOS: 8 days    Melrose Nakayama 03/29/2022

## 2022-03-29 NOTE — Progress Notes (Signed)
CARDIAC REHAB PHASE I     Pt feeling ok today, c/o feeling little more tired today. He is being transferred to 4E, so will delay walk for now.will return for walk as time allows this afternoon.  Pt reports poor appetite, discussed importance of  nutritional intake, encouraged pt to focus on easy to chew meals to avoid fatigue. Pt is doing well with sternal precautions getting in and out of be. Encouraged ambulation and IS use. Will continue to follow  1100-1130 Vanessa Barbara, RN BSN 03/29/2022 12:00 PM

## 2022-03-29 NOTE — Progress Notes (Signed)
Pt admitted to [place] from [facility] 2H.  Pt is A&OX4 and neuro intact.  Pt placed on telemetry and CCMD notified.  Vitals taken and within normal range. Pt is oriented to unit with call light in reach.  Pt is currently comfortable and not in pain.       03/29/22 1131  Vitals  Temp 98.4 F (36.9 C)  Temp Source Oral  BP 120/84  MAP (mmHg) 96  BP Location Right Arm  BP Method Automatic  Patient Position (if appropriate) Lying  Pulse Rate 87  Pulse Rate Source Monitor  ECG Heart Rate 85  Resp 20  Level of Consciousness  Level of Consciousness Alert  Oxygen Therapy  SpO2 94 %  O2 Device Room Air  Patient Activity (if Appropriate) In bed  Pulse Oximetry Type Continuous  Pain Assessment  Pain Scale 0-10  Pain Score 0  POSS Scale (Pasero Opioid Sedation Scale)  POSS *See Group Information* 1-Acceptable,Awake and alert  Glasgow Coma Scale  Eye Opening 4  Best Verbal Response (NON-intubated) 5  Best Motor Response 6  Glasgow Coma Scale Score 15  MEWS Score  MEWS Temp 0  MEWS Systolic 0  MEWS Pulse 0  MEWS RR 0  MEWS LOC 0  MEWS Score 0  MEWS Score Color Green

## 2022-03-30 LAB — BASIC METABOLIC PANEL
Anion gap: 10 (ref 5–15)
BUN: 13 mg/dL (ref 8–23)
CO2: 30 mmol/L (ref 22–32)
Calcium: 8.8 mg/dL — ABNORMAL LOW (ref 8.9–10.3)
Chloride: 98 mmol/L (ref 98–111)
Creatinine, Ser: 0.9 mg/dL (ref 0.61–1.24)
GFR, Estimated: >60 mL/min (ref >60)
Glucose, Bld: 151 mg/dL — ABNORMAL HIGH (ref 70–99)
Potassium: 3.7 mmol/L (ref 3.5–5.1)
Sodium: 138 mmol/L (ref 135–145)

## 2022-03-30 LAB — GLUCOSE, CAPILLARY: Glucose-Capillary: 118 mg/dL — ABNORMAL HIGH (ref 70–99)

## 2022-03-30 LAB — CBC
HCT: 30.1 % — ABNORMAL LOW (ref 39.0–52.0)
Hemoglobin: 11.2 g/dL — ABNORMAL LOW (ref 13.0–17.0)
MCH: 32.1 pg (ref 26.0–34.0)
MCHC: 37.2 g/dL — ABNORMAL HIGH (ref 30.0–36.0)
MCV: 86.2 fL (ref 80.0–100.0)
Platelets: 158 10*3/uL (ref 150–400)
RBC: 3.49 MIL/uL — ABNORMAL LOW (ref 4.22–5.81)
RDW: 12 % (ref 11.5–15.5)
WBC: 6.8 10*3/uL (ref 4.0–10.5)
nRBC: 0 % (ref 0.0–0.2)

## 2022-03-30 MED ORDER — PHENYTOIN SODIUM EXTENDED 100 MG PO CAPS: 200.0000 mg | ORAL_CAPSULE | ORAL | Status: DC

## 2022-03-30 MED ORDER — CLOPIDOGREL BISULFATE 75 MG PO TABS: 75.0000 mg | ORAL_TABLET | Freq: Every day | ORAL | 1 refills | Status: DC

## 2022-03-30 MED ORDER — ASPIRIN 81 MG PO TBEC: 81.0000 mg | DELAYED_RELEASE_TABLET | Freq: Every day | ORAL | Status: DC

## 2022-03-30 MED ORDER — ROSUVASTATIN CALCIUM 40 MG PO TABS: 40.0000 mg | ORAL_TABLET | Freq: Every day | ORAL | 1 refills | Status: DC

## 2022-03-30 MED ORDER — LANSOPRAZOLE 30 MG PO CPDR: 30.0000 mg | DELAYED_RELEASE_CAPSULE | Freq: Every day | ORAL | Status: DC

## 2022-03-30 MED ORDER — OXYCODONE HCL 5 MG PO TABS: 5.0000 mg | ORAL_TABLET | Freq: Four times a day (QID) | ORAL | 0 refills | Status: AC | PRN

## 2022-03-30 MED ORDER — METOPROLOL TARTRATE 25 MG PO TABS: 25.0000 mg | ORAL_TABLET | Freq: Two times a day (BID) | ORAL | 1 refills | Status: DC

## 2022-03-30 NOTE — Progress Notes (Signed)
Discharge orders received.  IV and telemetry removed, CCMD notified.  Education reviewed with patient.  They express understanding of f/u appts as well as medications.  Has not received education from cardiac rehab as he was expected to go tomorrow.  Perhaps someone can give him a call tomorrow. But pt received care instructions from this RN as well as CTCS PA.  Being discharged with CT sutures, office should be in contact with him to arrange visit for removal.  Next f/u visit isn't until 9/20.

## 2022-03-30 NOTE — Progress Notes (Addendum)
[address]       [place] [address]             [phone]      4 Days Post-Op Procedure(s) (LRB): CORONARY ARTERY BYPASS GRAFTING (CABG) X THREE, USING LEFT INTERNAL MAMMARY ARTERY AND RIGHT LEG GREATER SAPHENOUS VEIN (N/A) TRANSESOPHAGEAL ECHOCARDIOGRAM (TEE) (N/A) Subjective: Feels well, not sleeping well in hospital bed  Objective: Vital signs in last 24 hours: Temp:  [98 F (36.7 C)-99.7 F (37.6 C)] 98.6 F (37 C) (09/10 0447) Pulse Rate:  [87-96] 87 (09/10 0447) Cardiac Rhythm: Normal sinus rhythm (09/09 2012) Resp:  [12-20] 17 (09/10 0115) BP: (110-142)/(67-90) 115/67 (09/10 0447) SpO2:  [86 %-94 %] 94 % (09/10 0115) Weight:  [98 kg] 98 kg (09/10 0105)  Hemodynamic parameters for last 24 hours:    Intake/Output from previous day: 09/09 0701 - 09/10 0700 In: 600 [P.O.:600] Out: 1170 [Urine:1170] Intake/Output this shift: No intake/output data recorded.  General appearance: alert, cooperative, and no distress Heart: regular rate and rhythm Lungs: min dim in bases Abdomen: benign Extremities: no edema Wound: incis healing well  Lab Results: Recent Labs    03/29/22 0428 03/30/22 0358  WBC 8.6 6.8  HGB 11.8* 11.2*  HCT 32.4* 30.1*  PLT 133* 158   BMET:  Recent Labs    03/29/22 0428 03/30/22 0358  NA 138 138  K 4.2 3.7  CL 102 98  CO2 26 30  GLUCOSE 103* 151*  BUN 13 13  CREATININE 0.92 0.90  CALCIUM 8.6* 8.8*    PT/INR: No results for input(s): "LABPROT", "INR" in the last 72 hours. ABG    Component Value Date/Time   PHART 7.295 (L) 03/26/2022 1908   HCO3 24.0 03/26/2022 1908   TCO2 25 03/26/2022 1908   ACIDBASEDEF 3.0 (H) 03/26/2022 1908   O2SAT 96 03/26/2022 1908   CBG (last 3)  Recent Labs    03/29/22 1743 03/29/22 2115 03/30/22 0619  GLUCAP 141* 98 118*    Meds Scheduled Meds:  acetaminophen  1,000 mg Oral Q6H   Or   acetaminophen (TYLENOL) oral liquid 160 mg/5 mL  1,000 mg Per Tube Q6H    amiodarone  400 mg Oral BID   aspirin EC  81 mg Oral Daily   bisacodyl  10 mg Oral Daily   Or   bisacodyl  10 mg Rectal Daily   clopidogrel  75 mg Oral Daily   docusate sodium  200 mg Oral Daily   enoxaparin (LOVENOX) injection  40 mg Subcutaneous Q24H   furosemide  40 mg Oral Daily   gabapentin  300 mg Oral BID   insulin aspart  0-24 Units Subcutaneous TID WC   metoprolol tartrate  25 mg Oral BID   Or   metoprolol tartrate  25 mg Per Tube BID   phenytoin  200 mg Oral q morning   phenytoin  300 mg Oral q1800   potassium chloride  20 mEq Oral Daily   rosuvastatin  40 mg Oral Daily   sodium chloride flush  3 mL Intravenous Q12H   Continuous Infusions:  sodium chloride Stopped (03/27/22 1025)   sodium chloride Stopped (03/27/22 1022)   sodium chloride Stopped (03/29/22 2000)   lactated ringers     lactated ringers     lactated ringers Stopped (03/28/22 0749)   PRN Meds:.sodium chloride, fentaNYL (SUBLIMAZE) injection, lactated ringers, metoprolol tartrate, ondansetron (ZOFRAN) IV, mouth rinse, oxyCODONE, phenol, simethicone, sodium chloride flush  Xrays  DG Chest 2 View  Result Date: 03/29/2022 CLINICAL DATA:  NSTEMI. EXAM: CHEST - 2 VIEW COMPARISON:  03/28/2022 FINDINGS: Lungs are hypoinflated with small amount of posterior bilateral pleural fluid likely improved. Likely mild associated left basilar atelectasis. Cardiomediastinal silhouette and remainder of the exam is unchanged. IMPRESSION: Improving small bilateral pleural effusions likely with associated left basilar atelectasis. Electronically Signed   By: Marin Olp M.D.   On: 03/29/2022 08:10    Assessment/Plan: S/P Procedure(s) (LRB): CORONARY ARTERY BYPASS GRAFTING (CABG) X THREE, USING LEFT INTERNAL MAMMARY ARTERY AND RIGHT LEG GREATER SAPHENOUS VEIN (N/A) TRANSESOPHAGEAL ECHOCARDIOGRAM (TEE) (N/A) POD#4  1 afeb, Tmax 99.6, VSS, S BP 110's-140's, sinus rhythm 2 sats good on RA 3 weight below preop 4 normal renal  fxn 5 expected ABLA is very stable 6 thrombocytopenia is resolved 7 CBG well controlled- not a diabetic 8 stable for d/c , will get CT sutures out in office appt   LOS: 9 days    John Giovanni 03/30/2022   Patient seen and examined, agree with above Dc home today  Remo Lipps C. Roxan Hockey, MD [facility] [phone]

## 2022-03-30 NOTE — Progress Notes (Signed)
   03/30/22 0640  Mobility  Activity Ambulated with assistance in hallway  Range of Motion/Exercises Active;All extremities  Level of Assistance Contact guard assist, steadying assist  Distance Ambulated (ft) 470 ft  Activity Response Tolerated well   Pt requested walk this AM. Walked entire hallway and back using [employer] walker. Assisted to sit on edge of bed at this time. Tolerated well.

## 2022-03-31 ENCOUNTER — Telehealth (HOSPITAL_COMMUNITY): Payer: Self-pay | Admitting: *Deleted

## 2022-03-31 NOTE — Telephone Encounter (Signed)
CARDIAC REHAB PHASE I    Pt discharged home yesterday. He is doing well at home. Post [facility] education including exercise guidelines, risk factors, restrictions, site care, sternal precautions, heart healthy diet, move in the tub, IS use at home, and CRP2 reviewed. All questions and concerns addressed. Will refer to [facility] for CRP2.   1040-1110  Vanessa Barbara, RN BSN 03/31/2022 11:06 AM

## 2022-04-01 MED FILL — Heparin Sodium (Porcine) Inj 1000 Unit/ML: Qty: 1000 | Status: AC

## 2022-04-01 MED FILL — Magnesium Sulfate Inj 50%: INTRAMUSCULAR | Qty: 10 | Status: AC

## 2022-04-01 MED FILL — Potassium Chloride Inj 2 mEq/ML: INTRAVENOUS | Qty: 40 | Status: AC

## 2022-04-02 MED FILL — Electrolyte-R (PH 7.4) Solution: INTRAVENOUS | Qty: 4000 | Status: AC

## 2022-04-02 MED FILL — Heparin Sodium (Porcine) Inj 1000 Unit/ML: INTRAMUSCULAR | Qty: 10 | Status: AC

## 2022-04-02 MED FILL — Lidocaine HCl Local Soln Prefilled Syringe 100 MG/5ML (2%): INTRAMUSCULAR | Qty: 5 | Status: AC

## 2022-04-02 MED FILL — Sodium Chloride IV Soln 0.9%: INTRAVENOUS | Qty: 2000 | Status: AC

## 2022-04-02 MED FILL — Sodium Bicarbonate IV Soln 8.4%: INTRAVENOUS | Qty: 50 | Status: AC

## 2022-04-02 MED FILL — Mannitol IV Soln 20%: INTRAVENOUS | Qty: 500 | Status: AC

## 2022-04-02 NOTE — Progress Notes (Addendum)
[address]       [place] [address]             [phone]       HPI: This is a 61 year old male with a past medical history of seizure, hyperlipidemia, low back pain, hiatal hernia GERD who was found to have coronary artery disease. He underwent a CABG x 3 (LIMA to LAD, RSVG sequentially to Ramus intermediate and OM1) on 03/26/2022 by Dr. Lavonna Monarch. He was discharged in stable condition on 03/31/2022. Since hospital discharge the patient reports he notices periods of dizziness (which he had prior to surgery) last longer than previously. He is not having seizures, has a history of, but has not had one in over 10 years. He takes Dilantin for this. He walks everyday and has no significant shortness of breath.   Current Outpatient Medications  Medication Sig Dispense Refill   aspirin EC 81 MG tablet Take 1 tablet (81 mg total) by mouth daily. Swallow whole.     clopidogrel (PLAVIX) 75 MG tablet Take 1 tablet (75 mg total) by mouth daily. 30 tablet 1   lansoprazole (PREVACID) 30 MG capsule Take 1 capsule (30 mg total) by mouth daily before breakfast.     metoprolol tartrate (LOPRESSOR) 25 MG tablet Take 1 tablet (25 mg total) by mouth 2 (two) times daily. 60 tablet 1   oxyCODONE (OXY IR/ROXICODONE) 5 MG immediate release tablet Take 1 tablet (5 mg total) by mouth every 6 (six) hours as needed for up to 7 days for severe pain. 28 tablet 0   phenytoin (DILANTIN) 100 MG ER capsule Take 2-3 capsules (200-300 mg total) by mouth See admin instructions. Take 2 capsules (200 mg) every morning and then take 3 capsules (300 mg) at bedtime     rosuvastatin (CRESTOR) 40 MG tablet Take 1 tablet (40 mg total) by mouth daily. 30 tablet 1   Vitamin D, Ergocalciferol, (DRISDOL) 1.25 MG (50000 UNIT) CAPS capsule Take 50,000 Units by mouth daily.    Vital Signs: Vitals:   04/09/22 1444  BP: (!) 129/90  Pulse: 99  Resp: 18  SpO2: 97%      Physical Exam: CV-Slightly  tachycardic Pulmonary-Clear to auscultation bilaterally Extremities-No LE edema Wound-Eschar on chest tube wound and RLE wounds. No signs of wound infection Neurologic-No focal deficit  Diagnostic Tests: CLINICAL DATA:  Status post CABG x3.   EXAM: CHEST - 2 VIEW   COMPARISON:  Chest two views [id number]   FINDINGS: Status post median sternotomy and CABG. Cardiac silhouette is at the upper limits of normal size. Surgical clips overlie the left mediastinum and inferior cardiac silhouette. Mild calcification within the aortic arch. The lungs are clear. No pleural effusion or pneumothorax. Mild multilevel degenerative disc changes of the thoracic spine.   IMPRESSION: Status post median sternotomy and CABG.  No acute lung process.     Electronically Signed   By: Yvonne Kendall M.D.   On: 04/09/2022 14:33  Impression and Plan: Mr. Dahm is continuing to recover from coronary artery bypass grafting surgery. I have increased his Lopressor 37.5 mg bid for better HR control. Regarding dizziness, unsure of etiology. He was instructed to take BP around same time a few times every day and keep a record. If SBP 105 or less or feels worse, he is to contact office. He will follow up with physician regarding Dilantin and if there needs to be any change to this medication. Of  note, he has a follow up to [facility] from cardiology on 10/02 and will return to see Dr. Lavonna Monarch in 3 weeks.    Nani Skillern, PA-C [facility] [phone]

## 2022-04-03 ENCOUNTER — Ambulatory Visit: Payer: BC Managed Care – PPO

## 2022-04-04 DIAGNOSIS — Z736 Limitation of activities due to disability: Secondary | ICD-10-CM

## 2022-04-08 ENCOUNTER — Other Ambulatory Visit: Payer: Self-pay | Admitting: Thoracic Surgery (Cardiothoracic Vascular Surgery)

## 2022-04-08 DIAGNOSIS — Z951 Presence of aortocoronary bypass graft: Secondary | ICD-10-CM

## 2022-04-09 ENCOUNTER — Ambulatory Visit (INDEPENDENT_AMBULATORY_CARE_PROVIDER_SITE_OTHER): Payer: Self-pay | Admitting: Physician Assistant

## 2022-04-09 ENCOUNTER — Ambulatory Visit
Admission: RE | Admit: 2022-04-09 | Discharge: 2022-04-09 | Disposition: A | Discharge status: Home / Self Care | Payer: BC Managed Care – PPO | Source: Ambulatory Visit | Attending: Thoracic Surgery (Cardiothoracic Vascular Surgery) | Admitting: Thoracic Surgery (Cardiothoracic Vascular Surgery)

## 2022-04-09 VITALS — BP 129/90 | HR 99 | Resp 18 | Ht 74.0 in | Wt 214.0 lb

## 2022-04-09 DIAGNOSIS — Z951 Presence of aortocoronary bypass graft: Secondary | ICD-10-CM | POA: Diagnosis not present

## 2022-04-09 DIAGNOSIS — I700 Atherosclerosis of aorta: Secondary | ICD-10-CM | POA: Diagnosis not present

## 2022-04-09 DIAGNOSIS — M47814 Spondylosis without myelopathy or radiculopathy, thoracic region: Secondary | ICD-10-CM | POA: Diagnosis not present

## 2022-04-09 MED ORDER — METOPROLOL TARTRATE 25 MG PO TABS: 37.5000 mg | ORAL_TABLET | Freq: Two times a day (BID) | ORAL | 1 refills | Status: DC

## 2022-04-09 NOTE — Patient Instructions (Signed)
Lopressor increased from 25 mg bid to 37.5 mg bid

## 2022-04-10 ENCOUNTER — Telehealth: Payer: Self-pay

## 2022-04-10 NOTE — Telephone Encounter (Signed)
FMLA and STD forms completed and faxed to [facility] @ [phone]. Beginning LOA 03/19/22 through 06/23/22. Forms mail to home address.

## 2022-04-14 ENCOUNTER — Telehealth (HOSPITAL_COMMUNITY): Payer: Self-pay

## 2022-04-14 NOTE — Telephone Encounter (Signed)
Called patient to see if he is interested in the Cardiac Rehab Program. Patient expressed interest. Explained scheduling process, patient verbalized understanding. Will contact patient for scheduling once f/u has been completed.  

## 2022-04-17 ENCOUNTER — Inpatient Hospital Stay (HOSPITAL_COMMUNITY)
Admission: EM | Admit: 2022-04-17 | Discharge: 2022-04-19 | DRG: 250 | Disposition: A | Discharge status: Home / Self Care | Payer: BC Managed Care – PPO | Attending: Interventional Cardiology | Admitting: Interventional Cardiology

## 2022-04-17 ENCOUNTER — Emergency Department (HOSPITAL_COMMUNITY): Payer: BC Managed Care – PPO

## 2022-04-17 ENCOUNTER — Inpatient Hospital Stay (HOSPITAL_COMMUNITY)
Admission: EM | Disposition: A | Discharge status: Home / Self Care | Payer: Self-pay | Source: Home / Self Care | Attending: Interventional Cardiology

## 2022-04-17 ENCOUNTER — Inpatient Hospital Stay (HOSPITAL_COMMUNITY): Payer: BC Managed Care – PPO

## 2022-04-17 DIAGNOSIS — Z87891 Personal history of nicotine dependence: Secondary | ICD-10-CM | POA: Diagnosis not present

## 2022-04-17 DIAGNOSIS — Z79899 Other long term (current) drug therapy: Secondary | ICD-10-CM

## 2022-04-17 DIAGNOSIS — I5020 Unspecified systolic (congestive) heart failure: Secondary | ICD-10-CM

## 2022-04-17 DIAGNOSIS — I25810 Atherosclerosis of coronary artery bypass graft(s) without angina pectoris: Secondary | ICD-10-CM | POA: Diagnosis not present

## 2022-04-17 DIAGNOSIS — I2119 ST elevation (STEMI) myocardial infarction involving other coronary artery of inferior wall: Secondary | ICD-10-CM

## 2022-04-17 DIAGNOSIS — R569 Unspecified convulsions: Secondary | ICD-10-CM

## 2022-04-17 DIAGNOSIS — R231 Pallor: Secondary | ICD-10-CM | POA: Diagnosis not present

## 2022-04-17 DIAGNOSIS — Z888 Allergy status to other drugs, medicaments and biological substances status: Secondary | ICD-10-CM

## 2022-04-17 DIAGNOSIS — I462 Cardiac arrest due to underlying cardiac condition: Secondary | ICD-10-CM | POA: Diagnosis not present

## 2022-04-17 DIAGNOSIS — R0789 Other chest pain: Secondary | ICD-10-CM | POA: Diagnosis not present

## 2022-04-17 DIAGNOSIS — E785 Hyperlipidemia, unspecified: Secondary | ICD-10-CM | POA: Diagnosis not present

## 2022-04-17 DIAGNOSIS — Z7982 Long term (current) use of aspirin: Secondary | ICD-10-CM | POA: Diagnosis not present

## 2022-04-17 DIAGNOSIS — Z7902 Long term (current) use of antithrombotics/antiplatelets: Secondary | ICD-10-CM

## 2022-04-17 DIAGNOSIS — K219 Gastro-esophageal reflux disease without esophagitis: Secondary | ICD-10-CM | POA: Diagnosis present

## 2022-04-17 DIAGNOSIS — Z951 Presence of aortocoronary bypass graft: Secondary | ICD-10-CM

## 2022-04-17 DIAGNOSIS — G40909 Epilepsy, unspecified, not intractable, without status epilepticus: Secondary | ICD-10-CM | POA: Diagnosis not present

## 2022-04-17 DIAGNOSIS — D62 Acute posthemorrhagic anemia: Secondary | ICD-10-CM | POA: Diagnosis not present

## 2022-04-17 DIAGNOSIS — I213 ST elevation (STEMI) myocardial infarction of unspecified site: Secondary | ICD-10-CM | POA: Diagnosis not present

## 2022-04-17 DIAGNOSIS — Z85828 Personal history of other malignant neoplasm of skin: Secondary | ICD-10-CM | POA: Diagnosis not present

## 2022-04-17 DIAGNOSIS — I5021 Acute systolic (congestive) heart failure: Secondary | ICD-10-CM | POA: Diagnosis not present

## 2022-04-17 DIAGNOSIS — I2510 Atherosclerotic heart disease of native coronary artery without angina pectoris: Secondary | ICD-10-CM

## 2022-04-17 DIAGNOSIS — I2121 ST elevation (STEMI) myocardial infarction involving left circumflex coronary artery: Secondary | ICD-10-CM

## 2022-04-17 DIAGNOSIS — R079 Chest pain, unspecified: Secondary | ICD-10-CM | POA: Diagnosis not present

## 2022-04-17 DIAGNOSIS — E782 Mixed hyperlipidemia: Secondary | ICD-10-CM | POA: Diagnosis not present

## 2022-04-17 DIAGNOSIS — I3139 Other pericardial effusion (noninflammatory): Secondary | ICD-10-CM | POA: Diagnosis not present

## 2022-04-17 DIAGNOSIS — K5900 Constipation, unspecified: Secondary | ICD-10-CM | POA: Diagnosis not present

## 2022-04-17 DIAGNOSIS — I4901 Ventricular fibrillation: Secondary | ICD-10-CM | POA: Diagnosis not present

## 2022-04-17 HISTORY — PX: LEFT HEART CATH AND CORONARY ANGIOGRAPHY: CATH118249

## 2022-04-17 HISTORY — PX: LEFT HEART CATH AND CORS/GRAFTS ANGIOGRAPHY: CATH118250

## 2022-04-17 HISTORY — PX: CORONARY BALLOON ANGIOPLASTY: CATH118233

## 2022-04-17 HISTORY — DX: ST elevation (STEMI) myocardial infarction involving other coronary artery of inferior wall: I21.19

## 2022-04-17 LAB — LIPID PANEL
Cholesterol: 165 mg/dL (ref 0–200)
HDL: 79 mg/dL (ref >40)
LDL Cholesterol: 61 mg/dL (ref 0–99)
Total CHOL/HDL Ratio: 2.1 RATIO
Triglycerides: 123 mg/dL (ref <150)
VLDL: 25 mg/dL (ref 0–40)

## 2022-04-17 LAB — POCT I-STAT, CHEM 8
BUN: 9 mg/dL (ref 8–23)
BUN: 7 mg/dL — ABNORMAL LOW (ref 8–23)
Calcium, Ion: 1.2 mmol/L (ref 1.15–1.40)
Calcium, Ion: 1.17 mmol/L (ref 1.15–1.40)
Chloride: 100 mmol/L (ref 98–111)
Chloride: 102 mmol/L (ref 98–111)
Creatinine, Ser: 0.9 mg/dL (ref 0.61–1.24)
Creatinine, Ser: 0.7 mg/dL (ref 0.61–1.24)
Glucose, Bld: 120 mg/dL — ABNORMAL HIGH (ref 70–99)
Glucose, Bld: 125 mg/dL — ABNORMAL HIGH (ref 70–99)
HCT: 43 % (ref 39.0–52.0)
HCT: 39 % (ref 39.0–52.0)
Hemoglobin: 14.6 g/dL (ref 13.0–17.0)
Hemoglobin: 13.3 g/dL (ref 13.0–17.0)
Potassium: 3.6 mmol/L (ref 3.5–5.1)
Potassium: 3.8 mmol/L (ref 3.5–5.1)
Sodium: 140 mmol/L (ref 135–145)
Sodium: 138 mmol/L (ref 135–145)
TCO2: 26 mmol/L (ref 22–32)
TCO2: 23 mmol/L (ref 22–32)

## 2022-04-17 LAB — CBC WITH DIFFERENTIAL/PLATELET
Abs Immature Granulocytes: 0.01 10*3/uL (ref 0.00–0.07)
Basophils Absolute: 0.1 10*3/uL (ref 0.0–0.1)
Basophils Relative: 1 %
Eosinophils Absolute: 0.3 10*3/uL (ref 0.0–0.5)
Eosinophils Relative: 3 %
HCT: 42.8 % (ref 39.0–52.0)
Hemoglobin: 15 g/dL (ref 13.0–17.0)
Immature Granulocytes: 0 %
Lymphocytes Relative: 32 %
Lymphs Abs: 2.8 10*3/uL (ref 0.7–4.0)
MCH: 31.4 pg (ref 26.0–34.0)
MCHC: 35 g/dL (ref 30.0–36.0)
MCV: 89.7 fL (ref 80.0–100.0)
Monocytes Absolute: 0.6 10*3/uL (ref 0.1–1.0)
Monocytes Relative: 7 %
Neutro Abs: 5 10*3/uL (ref 1.7–7.7)
Neutrophils Relative %: 57 %
Platelets: 267 10*3/uL (ref 150–400)
RBC: 4.77 MIL/uL (ref 4.22–5.81)
RDW: 12.8 % (ref 11.5–15.5)
WBC: 8.7 10*3/uL (ref 4.0–10.5)
nRBC: 0 % (ref 0.0–0.2)

## 2022-04-17 LAB — COMPREHENSIVE METABOLIC PANEL
ALT: 14 U/L (ref 0–44)
AST: 18 U/L (ref 15–41)
Albumin: 4.3 g/dL (ref 3.5–5.0)
Alkaline Phosphatase: 108 U/L (ref 38–126)
Anion gap: 11 (ref 5–15)
BUN: 8 mg/dL (ref 8–23)
CO2: 26 mmol/L (ref 22–32)
Calcium: 9.7 mg/dL (ref 8.9–10.3)
Chloride: 103 mmol/L (ref 98–111)
Creatinine, Ser: 0.97 mg/dL (ref 0.61–1.24)
GFR, Estimated: >60 mL/min (ref >60)
Glucose, Bld: 119 mg/dL — ABNORMAL HIGH (ref 70–99)
Potassium: 3.7 mmol/L (ref 3.5–5.1)
Sodium: 140 mmol/L (ref 135–145)
Total Bilirubin: 0.5 mg/dL (ref 0.3–1.2)
Total Protein: 6.9 g/dL (ref 6.5–8.1)

## 2022-04-17 LAB — PROTIME-INR
INR: 1 (ref 0.8–1.2)
Prothrombin Time: 13.2 seconds (ref 11.4–15.2)

## 2022-04-17 LAB — TROPONIN I (HIGH SENSITIVITY)
Troponin I (High Sensitivity): 19 ng/L — ABNORMAL HIGH (ref <18)
Troponin I (High Sensitivity): 2080 ng/L (ref <18)

## 2022-04-17 LAB — MRSA NEXT GEN BY PCR, NASAL: MRSA by PCR Next Gen: NOT DETECTED

## 2022-04-17 LAB — POCT ACTIVATED CLOTTING TIME
Activated Clotting Time: 281 seconds
Activated Clotting Time: 179 seconds

## 2022-04-17 LAB — HEMOGLOBIN A1C
Hgb A1c MFr Bld: 4.4 % — ABNORMAL LOW (ref 4.8–5.6)
Mean Plasma Glucose: 79.58 mg/dL

## 2022-04-17 LAB — APTT: aPTT: 20 seconds — ABNORMAL LOW (ref 24–36)

## 2022-04-17 SURGERY — LEFT HEART CATH AND CORONARY ANGIOGRAPHY
Anesthesia: LOCAL

## 2022-04-17 MED ORDER — PRASUGREL HCL 10 MG PO TABS
60.0000 mg | ORAL_TABLET | Freq: Once | ORAL | Status: AC
  Administered 2022-04-17: 60 mg via ORAL
  Filled 2022-04-17: qty 6

## 2022-04-17 MED ORDER — SODIUM CHLORIDE 0.9 % IV SOLN: 250.0000 mL | INTRAVENOUS | Status: DC | PRN

## 2022-04-17 MED ORDER — FENTANYL CITRATE (PF) 100 MCG/2ML IJ SOLN
INTRAMUSCULAR | Status: AC
  Filled 2022-04-17: qty 2

## 2022-04-17 MED ORDER — ROSUVASTATIN CALCIUM 20 MG PO TABS
40.0000 mg | ORAL_TABLET | Freq: Every day | ORAL | Status: DC
  Administered 2022-04-18 – 2022-04-19 (×2): 40 mg via ORAL
  Filled 2022-04-17 (×2): qty 2

## 2022-04-17 MED ORDER — FENTANYL CITRATE (PF) 100 MCG/2ML IJ SOLN
INTRAMUSCULAR | Status: DC | PRN
  Administered 2022-04-17: 25 ug via INTRAVENOUS

## 2022-04-17 MED ORDER — PHENYTOIN SODIUM EXTENDED 100 MG PO CAPS
200.0000 mg | ORAL_CAPSULE | Freq: Every morning | ORAL | Status: DC
  Administered 2022-04-18 – 2022-04-19 (×2): 200 mg via ORAL
  Filled 2022-04-17 (×2): qty 2

## 2022-04-17 MED ORDER — CHLORHEXIDINE GLUCONATE CLOTH 2 % EX PADS
6.0000 | MEDICATED_PAD | Freq: Every day | CUTANEOUS | Status: DC
  Administered 2022-04-17 – 2022-04-18 (×2): 6 via TOPICAL

## 2022-04-17 MED ORDER — HYDRALAZINE HCL 20 MG/ML IJ SOLN: 10.0000 mg | INTRAMUSCULAR | Status: AC | PRN

## 2022-04-17 MED ORDER — PHENYTOIN SODIUM EXTENDED 100 MG PO CAPS: 200.0000 mg | ORAL_CAPSULE | ORAL | Status: DC

## 2022-04-17 MED ORDER — HEPARIN SODIUM (PORCINE) 1000 UNIT/ML IJ SOLN
INTRAMUSCULAR | Status: AC
  Filled 2022-04-17: qty 10

## 2022-04-17 MED ORDER — PANTOPRAZOLE SODIUM 40 MG PO TBEC
40.0000 mg | DELAYED_RELEASE_TABLET | Freq: Every day | ORAL | Status: DC
  Administered 2022-04-18 – 2022-04-19 (×2): 40 mg via ORAL
  Filled 2022-04-17 (×2): qty 1

## 2022-04-17 MED ORDER — ASPIRIN 81 MG PO CHEW
81.0000 mg | CHEWABLE_TABLET | Freq: Every day | ORAL | Status: DC
  Administered 2022-04-18 – 2022-04-19 (×2): 81 mg via ORAL
  Filled 2022-04-17 (×2): qty 1

## 2022-04-17 MED ORDER — TICAGRELOR 90 MG PO TABS
ORAL_TABLET | ORAL | Status: AC
  Filled 2022-04-17: qty 4

## 2022-04-17 MED ORDER — TICAGRELOR 90 MG PO TABS
ORAL_TABLET | ORAL | Status: DC | PRN
  Administered 2022-04-17: 180 mg via ORAL

## 2022-04-17 MED ORDER — NITROGLYCERIN 1 MG/10 ML FOR IR/CATH LAB
INTRA_ARTERIAL | Status: AC
  Filled 2022-04-17: qty 10

## 2022-04-17 MED ORDER — MIDAZOLAM HCL 2 MG/2ML IJ SOLN
INTRAMUSCULAR | Status: DC | PRN
  Administered 2022-04-17: 1 mg via INTRAVENOUS

## 2022-04-17 MED ORDER — PHENYTOIN SODIUM EXTENDED 100 MG PO CAPS
300.0000 mg | ORAL_CAPSULE | Freq: Every evening | ORAL | Status: DC
  Administered 2022-04-17 – 2022-04-18 (×2): 300 mg via ORAL
  Filled 2022-04-17 (×4): qty 3

## 2022-04-17 MED ORDER — ONDANSETRON HCL 4 MG/2ML IJ SOLN: 4.0000 mg | Freq: Four times a day (QID) | INTRAMUSCULAR | Status: DC | PRN

## 2022-04-17 MED ORDER — IOHEXOL 350 MG/ML SOLN
INTRAVENOUS | Status: DC | PRN
  Administered 2022-04-17: 70 mL

## 2022-04-17 MED ORDER — HEPARIN (PORCINE) IN NACL 1000-0.9 UT/500ML-% IV SOLN
INTRAVENOUS | Status: DC | PRN
  Administered 2022-04-17 (×2): 500 mL

## 2022-04-17 MED ORDER — ASPIRIN 81 MG PO TBEC: 81.0000 mg | DELAYED_RELEASE_TABLET | Freq: Every day | ORAL | Status: DC

## 2022-04-17 MED ORDER — HEPARIN SODIUM (PORCINE) 1000 UNIT/ML IJ SOLN
INTRAMUSCULAR | Status: DC | PRN
  Administered 2022-04-17: 11000 [IU] via INTRAVENOUS

## 2022-04-17 MED ORDER — MIDAZOLAM HCL 2 MG/2ML IJ SOLN
INTRAMUSCULAR | Status: AC
  Filled 2022-04-17: qty 2

## 2022-04-17 MED ORDER — METOPROLOL TARTRATE 25 MG PO TABS
37.5000 mg | ORAL_TABLET | Freq: Two times a day (BID) | ORAL | Status: DC
  Administered 2022-04-17: 37.5 mg via ORAL
  Filled 2022-04-17: qty 1

## 2022-04-17 MED ORDER — SODIUM CHLORIDE 0.9% FLUSH: 3.0000 mL | INTRAVENOUS | Status: DC | PRN

## 2022-04-17 MED ORDER — PRASUGREL HCL 10 MG PO TABS
10.0000 mg | ORAL_TABLET | Freq: Every day | ORAL | Status: DC
  Administered 2022-04-18 – 2022-04-19 (×2): 10 mg via ORAL
  Filled 2022-04-17 (×2): qty 1

## 2022-04-17 MED ORDER — SODIUM CHLORIDE 0.9% FLUSH
3.0000 mL | Freq: Two times a day (BID) | INTRAVENOUS | Status: DC
  Administered 2022-04-17 – 2022-04-19 (×4): 3 mL via INTRAVENOUS

## 2022-04-17 MED ORDER — ACETAMINOPHEN 325 MG PO TABS
650.0000 mg | ORAL_TABLET | ORAL | Status: DC | PRN
  Administered 2022-04-18: 650 mg via ORAL
  Filled 2022-04-17: qty 2

## 2022-04-17 MED ORDER — PRASUGREL HCL 10 MG PO TABS: 60.0000 mg | ORAL_TABLET | Freq: Once | ORAL | Status: DC

## 2022-04-17 MED ORDER — SODIUM CHLORIDE 0.9 % IV SOLN: INTRAVENOUS | Status: DC

## 2022-04-17 MED ORDER — LIDOCAINE HCL (PF) 1 % IJ SOLN
INTRAMUSCULAR | Status: DC | PRN
  Administered 2022-04-17: 10 mL

## 2022-04-17 MED ORDER — LABETALOL HCL 5 MG/ML IV SOLN: 10.0000 mg | INTRAVENOUS | Status: AC | PRN

## 2022-04-17 MED ORDER — SODIUM CHLORIDE 0.9 % IV SOLN: INTRAVENOUS | Status: AC

## 2022-04-17 SURGICAL SUPPLY — 17 items
BALLN SAPPHIRE 2.0X12 (BALLOONS) ×1
BALLOON SAPPHIRE 2.0X12 (BALLOONS) IMPLANT
CATH INFINITI 5 FR IM (CATHETERS) IMPLANT
CATH VISTA GUIDE 6FR JR4 (CATHETERS) IMPLANT
KIT ENCORE 26 ADVANTAGE (KITS) IMPLANT
KIT HEART LEFT (KITS) ×1 IMPLANT
KIT HEMO VALVE WATCHDOG (MISCELLANEOUS) IMPLANT
PACK CARDIAC CATHETERIZATION (CUSTOM PROCEDURE TRAY) ×1 IMPLANT
SHEATH PINNACLE 5F 10CM (SHEATH) IMPLANT
SHEATH PINNACLE 6F 10CM (SHEATH) IMPLANT
SHEATH PROBE COVER 6X72 (BAG) IMPLANT
TRANSDUCER W/STOPCOCK (MISCELLANEOUS) ×1 IMPLANT
TUBING CIL FLEX 10 FLL-RA (TUBING) ×1 IMPLANT
WIRE ASAHI PROWATER 180CM (WIRE) IMPLANT
WIRE EMERALD 3MM-J .035X150CM (WIRE) IMPLANT
WIRE EMERALD 3MM-J .035X260CM (WIRE) IMPLANT
WIRE EMERALD ST .035X150CM (WIRE) IMPLANT

## 2022-04-17 NOTE — Progress Notes (Signed)
Site area: right groin  Site Prior to Removal:  Level 0  Pressure Applied For 40 MINUTES    Minutes Beginning at 1645  Manual:   Yes.    Patient Status During Pull:  Stable  Post Pull Groin Site:  Level 1  Post Pull Instructions Given:  Yes.    Post Pull Pulses Present:  Yes.    Dressing Applied:  Yes.    Comments:  Bed rest started at 17 30 X 4 hr.

## 2022-04-17 NOTE — ED Notes (Signed)
Pt was speaking to cardiologist who said that he had no pain at the moment then was asked to take a deep breath and said it made him feel dizzy. After saying that he felt dizzy pt proceeded to go into VFIB. Pt was shocked once and given amiodarone and zofran and then was taken to the cath lab. Cath lab team at bedside the entire time.  Pt received 2 nitro, morphine and '324mg'$  ASA

## 2022-04-17 NOTE — H&P (Addendum)
Cardiology History and Physical   Patient ID: ZAHMIR LALLA MRN: [id number]; DOB: [birth year]  Admit date: 04/17/2022 Date of Consult: 04/17/2022  PCP:  Tyree Loffler, MD   [facility] Cardiologist:  Donato Heinz, MD        Patient Profile:   Wayne Vang is a 61 y.o. male with a hx of  seizures, GERD, hiatal hernia, was admitted 08/30 for CP/elevated troponin.   He had CABG x 3 on 09/06 with LIMA-LAD, RSVG-RI-OM. D/C 09/11.  Seen 09/20 and was continuing to recover, BB increased.  He is being seen 04/17/2022 for the evaluation of chest pain at the request of Dr Philip Aspen.  History of Present Illness:   Wayne Vang was doing well until today. He had sudden onset of severe chest pain, not exactly like his previous pain. He had some nausea as well. He took ASA 81 mg x 4 as directed by EMS.  EMS gave him SL NTG x 2 with partial relief.  In the ER, he had a VF arrest, ROSC obtained after 1 shock.   He was C&A, oriented. Resp were assisted during the arrest, but he did not require intubation.   He was still having pain, ECG by EMS abnl, pt taken directly to the cath lab.    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"   Esophageal reflux    Hiatal hernia    Low back pain    Other and unspecified hyperlipidemia    Seizure disorder ([place])    "over 10 years ago"   Tremor     Past Surgical History:  Procedure Laterality Date   COLONOSCOPY     CORONARY ARTERY BYPASS GRAFT N/A 03/26/2022   Procedure: CORONARY ARTERY BYPASS GRAFTING (CABG) X THREE, USING LEFT INTERNAL MAMMARY ARTERY AND RIGHT LEG GREATER SAPHENOUS VEIN;  Surgeon: [name], MD;  Location: [place];  Service: Open Heart Surgery;  Laterality: N/A;   INGUINAL HERNIA REPAIR Right 12/06/2013   Procedure:  REPAIR RIGHT INGUINAL HERNIA;  Surgeon: Joyice Faster. Cornett, MD;  Location: [place];  Service: General;  Laterality: Right;    INGUINAL HERNIA REPAIR Left 07/21/2013   INSERTION OF MESH N/A 12/06/2013   Procedure: INSERTION OF MESH;  Surgeon: Joyice Faster. Cornett, MD;  Location: [place];  Service: General;  Laterality: N/A;   LEFT HEART CATH AND CORONARY ANGIOGRAPHY N/A 03/20/2022   Procedure: LEFT HEART CATH AND CORONARY ANGIOGRAPHY;  Surgeon: Jettie Booze, MD;  Location: [facility];  Service: Cardiovascular;  Laterality: N/A;   POLYPECTOMY     TEE WITHOUT CARDIOVERSION N/A 03/26/2022   Procedure: TRANSESOPHAGEAL ECHOCARDIOGRAM (TEE);  Surgeon: [name], MD;  Location: [facility];  Service: Open Heart Surgery;  Laterality: N/A;     Home Medications:  Prior to Admission medications   Medication Sig Start Date End Date Taking? Authorizing Provider  aspirin EC 81 MG tablet Take 1 tablet (81 mg total) by mouth daily. Swallow whole. 03/30/22   John Giovanni, PA-C  clopidogrel (PLAVIX) 75 MG tablet Take 1 tablet (75 mg total) by mouth daily. 03/30/22   Gold, Wilder Glade, PA-C  lansoprazole (PREVACID) 30 MG capsule Take 1 capsule (30 mg total) by mouth daily before breakfast. 03/30/22   Gold, Patrick Jupiter E, PA-C  metoprolol tartrate (LOPRESSOR) 25 MG tablet Take 1.5 tablets (37.5 mg total) by mouth 2 (two) times daily. 04/09/22  Lars Pinks M, PA-C  phenytoin (DILANTIN) 100 MG ER capsule Take 2-3 capsules (200-300 mg total) by mouth See admin instructions. Take 2 capsules (200 mg) every morning and then take 3 capsules (300 mg) at bedtime 03/30/22   Gold, Wayne E, PA-C  rosuvastatin (CRESTOR) 40 MG tablet Take 1 tablet (40 mg total) by mouth daily. 03/30/22   Gold, Patrick Jupiter E, PA-C  Vitamin D, Ergocalciferol, (DRISDOL) 1.25 MG (50000 UNIT) CAPS capsule Take 50,000 Units by mouth daily.    [provider]    Inpatient Medications: Scheduled Meds:  Continuous Infusions:  PRN Meds:   Allergies:    Allergies  Allergen Reactions   Esomeprazole Magnesium Other (See Comments)    REACTION:  abdominal pain   Simvastatin Other (See Comments)    Wasn't working for pt    Alum Hydroxide-Mag Carbonate Other (See Comments)    Medication interaction only    Social History:   Social History   Socioeconomic History   Marital status: Married    Spouse name: Not on file   Number of children: 1   Years of education: Not on file   Highest education level: Not on file  Occupational History   Occupation: Shop Printmaker: [employer]    Comment: [employer]  Tobacco Use   Smoking status: Former    Types: Cigarettes    Quit date: 07/21/2002    Years since quitting: 19.7   Smokeless tobacco: Never  Vaping Use   Vaping Use: Never used  Substance and Sexual Activity   Alcohol use: Yes    Comment: socially   Drug use: No   Sexual activity: Not on file  Other Topics Concern   Not on file  Social History Narrative      He has been married 31+ years.   Caffeine Use: He drinks coke, tea, and coffee.   Truck Dealer   Social Determinants of Health   Financial Resource Strain: Not on file  Food Insecurity: No Food Insecurity (03/29/2022)   Hunger Vital Sign    Worried About Running Out of Food in the Last Year: Never true    [place] in the Last Year: Never true  Transportation Needs: No Transportation Needs (03/29/2022)   PRAPARE - Hydrologist (Medical): No    Lack of Transportation (Non-Medical): No  Physical Activity: Not on file  Stress: Not on file  Social Connections: Not on file  Intimate Partner Violence: Not At Risk (03/29/2022)   Humiliation, Afraid, Rape, and Kick questionnaire    Fear of Vang or Ex-Partner: No    Emotionally Abused: No    Physically Abused: No    Sexually Abused: No    Family History:   Family History  Problem Relation Age of Onset   Alzheimer's disease Mother    Cancer Father    Colon polyps Sister    Stroke Sister    Alcohol abuse Brother    Colon cancer Neg Hx     Esophageal cancer Neg Hx    Stomach cancer Neg Hx    Rectal cancer Neg Hx      ROS:  Please see the history of present illness.  All other ROS reviewed and negative.     Physical Exam/Data:  There were no vitals filed for this visit. No intake or output data in the 24 hours ending 04/17/22 1310    04/09/2022    2:44 PM  03/30/2022    1:05 AM 03/29/2022    5:00 AM  Last 3 Weights  Weight (lbs) 214 lb 216 lb 0.8 oz 217 lb 13 oz  Weight (kg) 97.07 kg 98 kg 98.8 kg     There is no height or weight on file to calculate BMI.  General:  Well nourished, well developed, in no acute distress HEENT: normal Neck: no JVD Vascular: No carotid bruits; Distal pulses 2+ bilaterally Cardiac:  normal S1, S2; RRR; no murmur  Lungs:  clear to auscultation bilaterally, no wheezing, rhonchi or rales  Abd: soft, nontender, no hepatomegaly  Ext: no edema Musculoskeletal:  No deformities, BUE and BLE strength normal and equal Skin: warm and dry  Neuro:  CNs 2-12 intact, no focal abnormalities noted Psych:  Normal affect   EKG:  The EKG was personally reviewed and demonstrates:  SR, Inferior ST changes Telemetry:  Telemetry was personally reviewed and demonstrates:  SR, VF arrest, SR  Relevant CV Studies:  LIMITED ECHO: 04/17/2022  1. Limited Echo. Tachycardic making the EF/wall motion assessment  challenging. Mild hypokinesis of the anteroseptal/anterior wall from the  base to the apex. Left ventricular ejection fraction, by estimation, is 45  to 50%. The left ventricle has mildly decreased function.   2. Right ventricular systolic function is normal. The right ventricular  size is not well visualized.   3. A small pericardial effusion is present. There is no evidence of  cardiac tamponade.   4. No evidence of mitral valve regurgitation.   5. The aortic valve is tricuspid. Aortic valve regurgitation is not  visualized.   6. The inferior vena cava is normal in size with <50% respiratory   variability, suggesting right atrial pressure of 8 mmHg.   LEFT HEART CATH AND CORONARY ANGIOGRAPHY     Prox LAD lesion is 90% stenosed.   1st Mrg lesion is 25% stenosed.   Ost Cx to Prox Cx lesion is 75% stenosed.   The left ventricular systolic function is normal.   LV end diastolic pressure is normal.   The left ventricular ejection fraction is 55-65% by visual estimate.   There is no aortic valve stenosis.   Left main equivalent disease with proximal LAD and ostial circumflex disease.    Will obtain cardiac surgery consult.  Restart IV heparin 2 hours after TR band removal.        ECHOCARDIOGRAM REPORT     Patient Name:   Wayne Vang Date of Exam: 03/20/2022  Medical Rec #:  [id number]       Height:       74.0 in  Accession #:    [id number]      Weight:       220.4 lb  Date of Birth:  Sep 18, 1960        BSA:          2.265 m  Patient Age:    60 years        BP:           113/84 mmHg  Patient Gender: M               HR:           65 bpm.  Exam Location:  Inpatient   Procedure: 2D Echo, Cardiac Doppler and Color Doppler   Indications:    NSTEMI I21.4     History:        Patient has no prior history of Echocardiogram  examinations.  Risk Factors:Dyslipidemia. NSTEMI.     Sonographer:    Ronny Flurry  Referring Phys: [id number] Crista Luria BHAGAT   IMPRESSIONS     1. Left ventricular ejection fraction, by estimation, is 60 to 65%. The  left ventricle has normal function. The left ventricle has no regional  wall motion abnormalities. There is mild concentric left ventricular  hypertrophy. Left ventricular diastolic  parameters are consistent with Grade I diastolic dysfunction (impaired  relaxation).   2. Right ventricular systolic function is normal. The right ventricular  size is normal. There is normal pulmonary artery systolic pressure.   3. The mitral valve is normal in structure. Trivial mitral valve  regurgitation. No evidence of mitral  stenosis.   4. The aortic valve is tricuspid. Aortic valve regurgitation is not  visualized. No aortic stenosis is present.   5. The inferior vena cava is normal in size with greater than 50%  respiratory variability, suggesting right atrial pressure of 3 mmHg.   FINDINGS   Left Ventricle: Left ventricular ejection fraction, by estimation, is 60  to 65%. The left ventricle has normal function. The left ventricle has no  regional wall motion abnormalities. The left ventricular internal cavity  size was normal in size. There is   mild concentric left ventricular hypertrophy. Left ventricular diastolic  parameters are consistent with Grade I diastolic dysfunction (impaired  relaxation).   Right Ventricle: The right ventricular size is normal. No increase in  right ventricular wall thickness. Right ventricular systolic function is  normal. There is normal pulmonary artery systolic pressure. The tricuspid  regurgitant velocity is 0.94 m/s, and   with an assumed right atrial pressure of 3 mmHg, the estimated right  ventricular systolic pressure is 6.5 mmHg.   Left Atrium: Left atrial size was normal in size.   Right Atrium: Right atrial size was normal in size.   Pericardium: There is no evidence of pericardial effusion.   Mitral Valve: The mitral valve is normal in structure. Trivial mitral  valve regurgitation. No evidence of mitral valve stenosis.   Tricuspid Valve: The tricuspid valve is normal in structure. Tricuspid  valve regurgitation is trivial. No evidence of tricuspid stenosis.   Aortic Valve: The aortic valve is tricuspid. Aortic valve regurgitation is  not visualized. No aortic stenosis is present.   Pulmonic Valve: The pulmonic valve was normal in structure. Pulmonic valve  regurgitation is not visualized. No evidence of pulmonic stenosis.   Aorta: The aortic root is normal in size and structure.   Venous: The inferior vena cava is normal in size with greater than  50%  respiratory variability, suggesting right atrial pressure of 3 mmHg.   IAS/Shunts: No atrial level shunt detected by color flow Doppler.      LEFT VENTRICLE  PLAX 2D  LVIDd:         4.30 cm   Diastology  LVIDs:         2.80 cm   LV e' medial:    5.98 cm/s  LV PW:         1.10 cm   LV E/e' medial:  10.3  LV IVS:        1.20 cm   LV e' lateral:   8.59 cm/s  LVOT diam:     2.10 cm   LV E/e' lateral: 7.2  LV SV:         67  LV SV Index:   29  LVOT Area:     3.46 cm  RIGHT VENTRICLE  RV S prime:     10.20 cm/s  TAPSE (M-mode): 2.0 cm   LEFT ATRIUM           Index        RIGHT ATRIUM           Index  LA diam:      3.10 cm 1.37 cm/m   RA Area:     17.50 cm  LA Vol (A2C): 37.5 ml 16.56 ml/m  RA Volume:   52.40 ml  23.13 ml/m   AORTIC VALVE  LVOT Vmax:   89.40 cm/s  LVOT Vmean:  58.300 cm/s  LVOT VTI:    0.192 m     AORTA  Ao Root diam: 3.40 cm  Ao Asc diam:  3.30 cm   MITRAL VALVE               TRICUSPID VALVE  MV Area (PHT): 2.69 cm    TR Peak grad:   3.5 mmHg  MV Decel Time: 282 msec    TR Vmax:        94.00 cm/s  MV E velocity: 61.77 cm/s  MV A velocity: 71.40 cm/s  SHUNTS  MV E/A ratio:  0.87        Systemic VTI:  0.19 m                             Systemic Diam: 2.10 cm   Skeet Latch MD  Electronically signed by Skeet Latch MD  Signature Date/Time: 03/20/2022/2:42:33 PM         Treatments: Surgery  Laboratory Data:  High Sensitivity Troponin:   Recent Labs  Lab 03/19/22 1707 03/19/22 1855 03/19/22 2250 03/20/22 0010  TROPONINIHS 42* 52* 34* 37*     ChemistryNo results for input(s): "NA", "K", "CL", "CO2", "GLUCOSE", "BUN", "CREATININE", "CALCIUM", "MG", "GFRNONAA", "GFRAA", "ANIONGAP" in the last 168 hours.  No results for input(s): "PROT", "ALBUMIN", "AST", "ALT", "ALKPHOS", "BILITOT" in the last 168 hours. Lipids No results for input(s): "CHOL", "TRIG", "HDL", "LABVLDL", "LDLCALC", "CHOLHDL" in the last 168 hours.  HematologyNo  results for input(s): "WBC", "RBC", "HGB", "HCT", "MCV", "MCH", "MCHC", "RDW", "PLT" in the last 168 hours. Thyroid No results for input(s): "TSH", "FREET4" in the last 168 hours.  BNPNo results for input(s): "BNP", "PROBNP" in the last 168 hours.  DDimer No results for input(s): "DDIMER" in the last 168 hours.   Radiology/Studies:  ECHOCARDIOGRAM COMPLETE  Result Date: 04/17/2022    ECHOCARDIOGRAM REPORT   Patient Name:   Wayne Vang Date of Exam: 04/17/2022 Medical Rec #:  [id number]     Height:       74.0 in Accession #:    [id number]    Weight:       214.1 lb Date of Birth:  05-06-1961      BSA:          2.237 m Patient Age:    9 years      BP:           -/- mmHg Patient Gender: M             HR:           100 bpm. Exam Location:  Inpatient Procedure: 2D Echo, Color Doppler and Cardiac Doppler STAT ECHO Indications:    R07.9* Chest pain, unspecified  History:        Patient has prior history of Echocardiogram examinations, most  recent 03/26/2022. Prior CABG; Risk Factors:Dyslipidemia.  Sonographer:    Raquel Sarna Senior RDCS Referring Phys: [name] MD  Sonographer Comments: STAT in ED prior to transfer to cath lab, Drs Irish Lack and Benwood present at bedside IMPRESSIONS  1. Limited Echo. Tachycardic making the EF/wall motion assessment challenging. Mild hypokinesis of the anteroseptal/anterior wall from the base to the apex. Left ventricular ejection fraction, by estimation, is 45 to 50%. The left ventricle has mildly decreased function.  2. Right ventricular systolic function is normal. The right ventricular size is not well visualized.  3. A small pericardial effusion is present. There is no evidence of cardiac tamponade.  4. No evidence of mitral valve regurgitation.  5. The aortic valve is tricuspid. Aortic valve regurgitation is not visualized.  6. The inferior vena cava is normal in size with <50% respiratory variability, suggesting right atrial pressure of 8 mmHg. FINDINGS  Left  Ventricle: Limited Echo. Tachycardic making the EF/wall motion assessment challenging. Mild hypokinesis of the anteroseptal/anterior wall from the base to the apex. Left ventricular ejection fraction, by estimation, is 45 to 50%. The left ventricle  has mildly decreased function. The left ventricular internal cavity size was normal in size. There is no left ventricular hypertrophy. Right Ventricle: The right ventricular size is not well visualized. Right ventricular systolic function is normal. Left Atrium: Left atrial size was normal in size. Right Atrium: Right atrial size was normal in size. Pericardium: A small pericardial effusion is present. There is no evidence of cardiac tamponade. Mitral Valve: No evidence of mitral valve regurgitation. Tricuspid Valve: Tricuspid valve regurgitation is not demonstrated. Aortic Valve: The aortic valve is tricuspid. Aortic valve regurgitation is not visualized. Pulmonic Valve: Pulmonic valve regurgitation is not visualized. Venous: The inferior vena cava is normal in size with less than 50% respiratory variability, suggesting right atrial pressure of 8 mmHg. IAS/Shunts: No atrial level shunt detected by color flow Doppler. Phineas Inches Electronically signed by Phineas Inches Signature Date/Time: 04/17/2022/1:06:43 PM    Final      Assessment and Plan:   STEMI - Pt taken directly to the cath lab, results pending - continue d/c meds   Risk Assessment/Risk Scores:     TIMI Risk Score for ST  Elevation MI:   The patient's TIMI risk score is 2, which indicates a 2.2% risk of all cause mortality at 30 days.      For questions or updates, please contact [place] Please consult [email or web address] for contact info under    Signed, Rosaria Ferries, PA-C  04/17/2022 1:10 PM  I have examined the patient and reviewed assessment and plan and discussed with patient.  Agree with above as stated.    I personally reviewed the ECG and saw the patient in the  emergency room.  While I was speaking to him, he had a VF arrest.  I personally reviewed the echo images after he was defibrillated.  There is a small pericardial effusion.  Wall motion appeared intact, appropriate for postsurgical anatomy.  Cath showed: "Prox LAD lesion is 90% stenosed.  LIMA to LAD was widely patent.   Ost Cx to Prox Cx lesion is 75% stenosed.   1st Mrg-1 lesion is 25% stenosed.   1st Mrg-2 lesion is 99% stenosed at the insertion site of the graft.   Balloon angioplasty was performed using a BALLN SAPPHIRE 2.0X12.   Post intervention, there is a 0% residual stenosis.   LV end diastolic pressure is mildly elevated.   There is  no aortic valve stenosis.   Successful PTCA of the subtotal occlusion of the SVG to OM insertion site.  TIMI-3 flow restored.  Patient's symptoms resolved.   Antiplatelet therapy is complicated due to his phenytoin use.  After discussion with pharmacy, will plan to change clopidogrel over to Effient.  Brilinta was initially given for rapid antiplatelet therapy but given chronic phenytoin use, the Brilinta effect will be diminished.  We will load Effient 60 mg this evening.  Start daily Effient tomorrow.  Watch in ICU.  [name]

## 2022-04-17 NOTE — Progress Notes (Unsigned)
Cardiology Office Note:    Date:  04/17/2022   ID:  Wayne Vang, DOB 05/08/1961, MRN [id number]  PCP:  Mazzarella Loffler, [place] Providers Cardiologist:  Donato Heinz, MD { Click to update primary MD,subspecialty MD or APP then REFRESH:1}    Referring MD: Peale Loffler, MD   No chief complaint on file. ***  History of Present Illness:    Wayne Vang is a 61 y.o. male with a hx of CAD s/p CABG, HLD, seizure disorder, GERD, and hiatal hernia. He was hospitalized 03/19/22 with chest pain and elevated troponin. Heart catheterization revealed left main equivalent disease with proximal LAD and ostial LCX disease. He was recommended for CABG revascularization. Echocardiogram with preserved EF 60-65%, mild LVH, grade 1 DD, and no significant valvular disease. He underwent CABG x 3 with LIMA-LAD and SVG-RI-IM1 on 03/26/22. Post op course was unremarkable. Unfortunately, he presented back to [facility] 04/17/22 for sudden onset severe CP. CODE STEMI activated in the field. While in the ER, he suffered a Vfib arrest with ROSC after 1 shock. He was taken to the cath lab emergently for heart cath. HS troponin trended to 2080. LHC showed patent grafts but 99% occlusion of the OM2 at the graft insertion site treated with PTCA. Antiplatelet therapy felt complicated by his antiseizure medication phenytoin.  It was felt brilinta's effectiveness would be diminished by phenytoin. Plavix was switched to effient.    He presents for routine follow up.      CAD s/p CABG x 3 LIMA-LAD and SVG-RI-IM1 on 03/26/22 ASA and effient, brilinta not used in the setting of phenytoin use Continue BB and statin   VF arrest VF arrest in [facility] 04/17/22 with ROSC after 1 shock, in the setting of STEMI. No signifcant ventricular arrhythmias following revascularization. Continue BB. No lifevest recommended.    Acute mild cardiomyopathy Echo with LVEF 45-50% with mild hypokinesis of the anterior  septal-anterior wall from base to apex with small pericardial effusion. Suspect due to inferior STEMI and VF arrest. Suspect this will improve. GDMT limited by BP, no ARB or spironolactone.  Consider repeat echo in 3 months. Lopressor switched to toprol.    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"   Esophageal reflux    Hiatal hernia    Low back pain    Other and unspecified hyperlipidemia    Seizure disorder ([place])    "over 10 years ago"   Tremor     Past Surgical History:  Procedure Laterality Date   COLONOSCOPY     CORONARY ARTERY BYPASS GRAFT N/A 03/26/2022   Procedure: CORONARY ARTERY BYPASS GRAFTING (CABG) X THREE, USING LEFT INTERNAL MAMMARY ARTERY AND RIGHT LEG GREATER SAPHENOUS VEIN;  Surgeon: [name], MD;  Location: [place];  Service: Open Heart Surgery;  Laterality: N/A;   INGUINAL HERNIA REPAIR Right 12/06/2013   Procedure:  REPAIR RIGHT INGUINAL HERNIA;  Surgeon: Joyice Faster. Cornett, MD;  Location: [place];  Service: General;  Laterality: Right;   INGUINAL HERNIA REPAIR Left 07/21/2013   INSERTION OF MESH N/A 12/06/2013   Procedure: INSERTION OF MESH;  Surgeon: Joyice Faster. [place], MD;  Location: [place];  Service: General;  Laterality: N/A;  LEFT HEART CATH AND CORONARY ANGIOGRAPHY N/A 03/20/2022   Procedure: LEFT HEART CATH AND CORONARY ANGIOGRAPHY;  Surgeon: Jettie Booze, MD;  Location: [facility];  Service: Cardiovascular;  Laterality: N/A;   POLYPECTOMY     TEE WITHOUT CARDIOVERSION N/A 03/26/2022   Procedure: TRANSESOPHAGEAL ECHOCARDIOGRAM (TEE);  Surgeon: [name], MD;  Location: [place];  Service: Open Heart Surgery;  Laterality: N/A;    Current Medications: No outpatient  medications have been marked as taking for the 04/21/22 encounter (Appointment) with Ledora Bottcher, [place].     Allergies:   Esomeprazole magnesium, Simvastatin, and Alum hydroxide-mag carbonate   Social History   Socioeconomic History   Marital status: Married    Spouse name: Not on file   Number of children: 1   Years of education: Not on file   Highest education level: Not on file  Occupational History   Occupation: Shop Printmaker: [employer]    Comment: [employer]  Tobacco Use   Smoking status: Former    Types: Cigarettes    Quit date: 07/21/2002    Years since quitting: 19.7   Smokeless tobacco: Never  Vaping Use   Vaping Use: Never used  Substance and Sexual Activity   Alcohol use: Yes    Comment: socially   Drug use: No   Sexual activity: Not on file  Other Topics Concern   Not on file  Social History Narrative      He has been married 31+ years.   Caffeine Use: He drinks coke, tea, and coffee.   Truck Dealer   Social Determinants of Health   Financial Resource Strain: Not on file  Food Insecurity: No Food Insecurity (03/29/2022)   Hunger Vital Sign    Worried About Running Out of Food in the Last Year: Never true    [place] in the Last Year: Never true  Transportation Needs: No Transportation Needs (03/29/2022)   PRAPARE - [place]rologist (Medical): No    Lack of Transportation (Non-Medical): No  Physical Activity: Not on file  Stress: Not on file  Social Connections: Not on file     Family History: The patient's ***family history includes Alcohol abuse in his brother; Alzheimer's disease in his mother; Cancer in his father; Colon polyps in his sister; Stroke in his sister. There is no history of Colon cancer, Esophageal cancer, Stomach cancer, or Rectal cancer.  ROS:   Please see the history of present illness.    *** All other systems reviewed and are negative.  EKGs/Labs/Other  Studies Reviewed:    The following studies were reviewed today:  Echocardiogram 04/17/2022: Impressions: 1. Limited Echo. Tachycardic making the EF/wall motion assessment  challenging. Mild hypokinesis of the anteroseptal/anterior wall from the  base to the apex. Left ventricular ejection fraction, by estimation, is 45  to 50%. The left ventricle has mildly  decreased function.   2. Right ventricular systolic function is normal. The right ventricular  size is not well visualized.   3. A small pericardial effusion is present. There is no evidence of  cardiac tamponade.   4. No evidence of mitral valve regurgitation.   5. The aortic valve is tricuspid. Aortic valve regurgitation is not  visualized.   6. The inferior vena cava is normal in size with <50% respiratory  variability, suggesting right atrial pressure of 8 mmHg.  _______________   Left Cardiac Catheterization 04/17/2022:  Prox LAD lesion is 90% stenosed.  LIMA to LAD is widely patent.   Ost Cx to Prox Cx lesion is 75% stenosed.  The SVG to ramus portion of the jump graft is widely patent.   1st Mrg-1 lesion is 25% stenosed.   1st Mrg-2 lesion is 99% stenosed at the insertion site of the SVG.   Balloon angioplasty was performed using a BALLN SAPPHIRE 2.0X12.   Post intervention, there is a 0% residual stenosis.   LV end diastolic pressure is mildly elevated.   There is no aortic valve stenosis.   Successful PTCA of the subtotal occlusion of the SVG to OM insertion site.  TIMI-3 flow restored.  Patient's symptoms resolved.   Antiplatelet therapy is complicated due to his phenytoin use.  After discussion with pharmacy, will plan to change clopidogrel over to Effient.  Brilinta was initially given for rapid antiplatelet therapy but given chronic phenytoin use, the Brilinta effect will be diminished.  We will load Effient 60 mg this evening.  Start daily Effient tomorrow.   Diagnostic Dominance: Right  Intervention        EKG:  EKG is *** ordered today.  The ekg ordered today demonstrates ***  Recent Labs: 03/19/2022: ALT 20; TSH 1.321 03/27/2022: Magnesium 2.2 04/17/2022: BUN 9; Creatinine, Ser 0.90; Hemoglobin 14.6; Platelets 267; Potassium 3.6; Sodium 140  Recent Lipid Panel    Component Value Date/Time   CHOL 219 (H) 03/20/2022 0010   TRIG 41 03/20/2022 0010   HDL 82 03/20/2022 0010   CHOLHDL 2.7 03/20/2022 0010   VLDL 8 03/20/2022 0010   LDLCALC 129 (H) 03/20/2022 0010   LDLDIRECT 194.1 04/27/2009 0901     Risk Assessment/Calculations:   {Does this patient have ATRIAL FIBRILLATION?[phone]}  No BP recorded.  {Refresh Note OR Click here to enter BP  :1}***         Physical Exam:    VS:  There were no vitals taken for this visit.    Wt Readings from Last 3 Encounters:  04/09/22 214 lb (97.1 kg)  03/30/22 216 lb 0.8 oz (98 kg)  08/20/21 236 lb (107 kg)     GEN: *** Well nourished, well developed in no acute distress HEENT: Normal NECK: No JVD; No carotid bruits LYMPHATICS: No lymphadenopathy CARDIAC: ***RRR, no murmurs, rubs, gallops RESPIRATORY:  Clear to auscultation without rales, wheezing or rhonchi  ABDOMEN: Soft, non-tender, non-distended MUSCULOSKELETAL:  No edema; No deformity  SKIN: Warm and dry NEUROLOGIC:  Alert and oriented x 3 PSYCHIATRIC:  Normal affect   ASSESSMENT:    No diagnosis found. PLAN:    In order of problems listed above:  ***  {The patient has an active order for outpatient cardiac rehabilitation.   Please indicate if the patient is ready to start. Do NOT delete this.  It will auto delete.  Refresh note, then sign.              Click here to document readiness and see contraindications.  :1}  Cardiac Rehabilitation Eligibility Assessment       {Are you ordering a CV Procedure (e.g. stress test, cath, DCCV, TEE, etc)?   Press F2        :[id number]}    Medication Adjustments/Labs and Tests Ordered: Current medicines are reviewed at  length with the patient today.  Concerns regarding medicines are outlined above.  No orders of the defined types were placed in this encounter.  No orders of the defined types were placed in this  encounter.   There are no Patient Instructions on file for this visit.   Signed, Tami Lin Arita Severtson, PA  04/17/2022 2:08 PM    [place]

## 2022-04-17 NOTE — ED Notes (Signed)
Pt bib [facility] from home after having centralized chest pain today while sitting at the table. Pt had CABG 9/6. Pt arrives AOx4 Pt had elevation on his EKG with EMS and cath team wanted a stat echo before going to the lab.

## 2022-04-17 NOTE — ED Provider Notes (Signed)
[facility] EMERGENCY DEPARTMENT Provider Note   CSN: [id number] Arrival date & time: 04/17/22  1236     History  No chief complaint on file.   Wayne Vang is a 61 y.o. male.  61 year old male with a history of hyperlipidemia and MI status post CABG on 03/26/2022 who presented to the emergency department with chest pain. Patient reported that approximately an hour prior to arrival he started experiencing substernal chest pressure.  Initially thought it was GERD and tried eating and drinking which did not resolve his symptoms.  Said that he felt hot but did not experience diaphoresis or vomiting.  Says that it is not exertional.  Says that it initially was relieved by leaning forwards but is not at this time.  Called 911 who gave him 324 of aspirin, 2 nitroglycerin, and 4 mg of morphine reports that his chest pain has improved at this time.  Also describes a start stabbing-like sensation in his chest but states that it is not pleuritic.  STEMI alert called from the field and cath team was activated.  They requested to evaluate the patient in the emergency department prior to going to the Cath Lab.      Home Medications Prior to Admission medications   Medication Sig Start Date End Date Taking? Authorizing Provider  aspirin EC 81 MG tablet Take 1 tablet (81 mg total) by mouth daily. Swallow whole. 03/30/22   John Giovanni, PA-C  clopidogrel (PLAVIX) 75 MG tablet Take 1 tablet (75 mg total) by mouth daily. 03/30/22   Gold, Wilder Glade, PA-C  lansoprazole (PREVACID) 30 MG capsule Take 1 capsule (30 mg total) by mouth daily before breakfast. 03/30/22   Gold, Patrick Jupiter E, PA-C  metoprolol tartrate (LOPRESSOR) 25 MG tablet Take 1.5 tablets (37.5 mg total) by mouth 2 (two) times daily. 04/09/22   Nani Skillern, PA-C  phenytoin (DILANTIN) 100 MG ER capsule Take 2-3 capsules (200-300 mg total) by mouth See admin instructions. Take 2 capsules (200 mg) every morning and then take 3  capsules (300 mg) at bedtime 03/30/22   Gold, Wayne E, PA-C  rosuvastatin (CRESTOR) 40 MG tablet Take 1 tablet (40 mg total) by mouth daily. 03/30/22   Gold, Patrick Jupiter E, PA-C  Vitamin D, Ergocalciferol, (DRISDOL) 1.25 MG (50000 UNIT) CAPS capsule Take 50,000 Units by mouth daily.    [provider]      Allergies    Esomeprazole magnesium, Simvastatin, and Alum hydroxide-mag carbonate    Review of Systems   Review of Systems  Physical Exam Updated Vital Signs There were no vitals taken for this visit. Physical Exam Vitals and nursing note reviewed.  Constitutional:      General: He is not in acute distress.    Appearance: He is well-developed.  HENT:     Head: Normocephalic and atraumatic.     Right Ear: External ear normal.     Left Ear: External ear normal.     Nose: Nose normal.  Eyes:     Extraocular Movements: Extraocular movements intact.     Conjunctiva/sclera: Conjunctivae normal.     Pupils: Pupils are equal, round, and reactive to light.  Cardiovascular:     Rate and Rhythm: Normal rate and regular rhythm.     Heart sounds: Normal heart sounds.     Comments: Sternotomy scar present Pulmonary:     Effort: Pulmonary effort is normal. No respiratory distress.     Breath sounds: Normal breath sounds.  Abdominal:  General: There is no distension.     Palpations: Abdomen is soft.  Musculoskeletal:        General: No swelling.     Cervical back: Normal range of motion and neck supple.     Right lower leg: No edema.     Left lower leg: No edema.  Skin:    General: Skin is warm and dry.     Capillary Refill: Capillary refill takes less than 2 seconds.  Neurological:     Mental Status: He is alert and oriented to person, place, and time. Mental status is at baseline.  Psychiatric:        Mood and Affect: Mood normal.        Behavior: Behavior normal.     ED Results / Procedures / Treatments   Labs (all labs ordered are listed, but only abnormal results  are displayed) Labs Reviewed  RESP PANEL BY RT-PCR (FLU A&B, COVID) ARPGX2  HEMOGLOBIN A1C  CBC WITH DIFFERENTIAL/PLATELET  PROTIME-INR  APTT  COMPREHENSIVE METABOLIC PANEL  LIPID PANEL  I-STAT CHEM 8, ED  TROPONIN I (HIGH SENSITIVITY)    EKG EKG Interpretation  Date/Time:  Thursday April 17 2022 12:42:40 EDT Ventricular Rate:  76 PR Interval:  117 QRS Duration: 91 QT Interval:  339 QTC Calculation: 382 R Axis:   28 Text Interpretation: Sinus or ectopic atrial rhythm Multiple ventricular premature complexes Borderline short PR interval Confirmed by Margaretmary Eddy [phone]) on 04/17/2022 12:53:01 PM  Radiology No results found.  Procedures .Cardioversion  Date/Time: 04/17/2022 1:00 PM  Performed by: Fransico Meadow, MD Authorized by: Fransico Meadow, MD   Consent:    Consent obtained:  Emergent situation Pre-procedure details:    Cardioversion basis:  Emergent   Pre-procedure rhythm: v fib.   Electrode placement:  Anterior-posterior Attempt one:    Shock (Joules):  120   Shock outcome:  Conversion to normal sinus rhythm Post-procedure details:    Patient status:  Alert   Patient tolerance of procedure:  Tolerated well, no immediate complications    Medications Ordered in ED Medications  0.9 %  sodium chloride infusion (has no administration in time range)    ED Course/ Medical Decision Making/ A&P                           Medical Decision Making Amount and/or Complexity of Data Reviewed Labs: ordered. Radiology: ordered.  Risk Prescription drug management.   Wayne Vang is a 61 y.o. male with history of recent CABG who presents with chief complaint of chest pain  Initial Ddx:  STEMI, postoperative pain, postoperative pericarditis, aortic pathology  MDM:  Initial was concerned about STEMI given the patient's inferior ST elevations and ST depressions in the high lateral leads that were noted on EMS EKG.  Is already been loaded with  aspirin and chest pain is improved with the nitroglycerin.  Cath Lab was activated and they are coming to evaluate the patient in the emergency department.  Feel that while postoperative pain and pericarditis are possible were concern for STEMI at this time.  Aortic pathology also possible given the patient's recent operation but feel less likely since there is no valvular manipulation.  Plan:  Labs Troponin EKG Chest x-ray Pain control Cardiology consult  ED Summary:  While patient was initially being evaluated by cardiology at the bedside he went unresponsive.  He was attached to his illness found to be in ventricular fibrillation.  He was defibrillated with 120 J with restoration of normal sinus rhythm.  He was loaded with 300 mg of amiodarone per cardiology request and was taken to the Cath Lab.  Considered intubating the patient but he was mentating well after the defibrillation and cardiology preferred to take him to the Cath Lab and stated that they will call anesthesia if they need an airway.  Dispo: Cath lab  Additional history obtained from EMS Records reviewed DC Summary Consults: Cardiology   CRITICAL CARE Performed by: Fransico Meadow   Total critical care time: 30 minutes  Critical care time was exclusive of separately billable procedures and treating other patients.  Critical care was necessary to treat or prevent imminent or life-threatening deterioration.  Critical care was time spent personally by me on the following activities: development of treatment plan with patient and/or surrogate as well as nursing, discussions with consultants, evaluation of patient's response to treatment, examination of patient, obtaining history from patient or surrogate, ordering and performing treatments and interventions, ordering and review of laboratory studies, ordering and review of radiographic studies, pulse oximetry and re-evaluation of patient's condition.   Final Clinical  Impression(s) / ED Diagnoses Final diagnoses:  ST elevation myocardial infarction (STEMI), unspecified artery ([place])  Ventricular fibrillation [facility])    Rx / DC Orders ED Discharge Orders     None         Fransico Meadow, MD 04/17/22 1301

## 2022-04-18 ENCOUNTER — Other Ambulatory Visit (HOSPITAL_COMMUNITY): Payer: Self-pay

## 2022-04-18 ENCOUNTER — Encounter (HOSPITAL_COMMUNITY): Payer: Self-pay | Admitting: Interventional Cardiology

## 2022-04-18 ENCOUNTER — Telehealth (HOSPITAL_COMMUNITY): Payer: Self-pay | Admitting: Pharmacy Technician

## 2022-04-18 DIAGNOSIS — I213 ST elevation (STEMI) myocardial infarction of unspecified site: Secondary | ICD-10-CM | POA: Diagnosis not present

## 2022-04-18 DIAGNOSIS — I4901 Ventricular fibrillation: Secondary | ICD-10-CM | POA: Diagnosis not present

## 2022-04-18 LAB — BASIC METABOLIC PANEL
Anion gap: 10 (ref 5–15)
BUN: 8 mg/dL (ref 8–23)
CO2: 27 mmol/L (ref 22–32)
Calcium: 9.9 mg/dL (ref 8.9–10.3)
Chloride: 104 mmol/L (ref 98–111)
Creatinine, Ser: 0.88 mg/dL (ref 0.61–1.24)
GFR, Estimated: >60 mL/min (ref >60)
Glucose, Bld: 120 mg/dL — ABNORMAL HIGH (ref 70–99)
Potassium: 4.1 mmol/L (ref 3.5–5.1)
Sodium: 141 mmol/L (ref 135–145)

## 2022-04-18 LAB — CBC
HCT: 42.5 % (ref 39.0–52.0)
Hemoglobin: 15 g/dL (ref 13.0–17.0)
MCH: 31.5 pg (ref 26.0–34.0)
MCHC: 35.3 g/dL (ref 30.0–36.0)
MCV: 89.3 fL (ref 80.0–100.0)
Platelets: 230 10*3/uL (ref 150–400)
RBC: 4.76 MIL/uL (ref 4.22–5.81)
RDW: 12.7 % (ref 11.5–15.5)
WBC: 7.2 10*3/uL (ref 4.0–10.5)
nRBC: 0 % (ref 0.0–0.2)

## 2022-04-18 MED ORDER — METOPROLOL SUCCINATE ER 25 MG PO TB24
25.0000 mg | ORAL_TABLET | Freq: Every day | ORAL | Status: DC
  Administered 2022-04-18 – 2022-04-19 (×2): 25 mg via ORAL
  Filled 2022-04-18 (×2): qty 1

## 2022-04-18 MED ORDER — ORAL CARE MOUTH RINSE: 15.0000 mL | OROMUCOSAL | Status: DC | PRN

## 2022-04-18 MED FILL — Nitroglycerin IV Soln 100 MCG/ML in D5W: INTRA_ARTERIAL | Qty: 10 | Status: AC

## 2022-04-18 NOTE — Progress Notes (Signed)
Rounding Note    Patient Name: Wayne Vang Date of Encounter: 04/18/2022  [place] Cardiologist: Donato Heinz, MD   Subjective   Denies chest pain or dyspnea  Inpatient Medications    Scheduled Meds:  aspirin  81 mg Oral Daily   Chlorhexidine Gluconate Cloth  6 each Topical Q0600   metoprolol tartrate  37.5 mg Oral BID   pantoprazole  40 mg Oral Daily   phenytoin  200 mg Oral q AM   And   phenytoin  300 mg Oral QPM   prasugrel  10 mg Oral Daily   rosuvastatin  40 mg Oral Daily   sodium chloride flush  3 mL Intravenous Q12H   Continuous Infusions:  sodium chloride     sodium chloride     PRN Meds: sodium chloride, acetaminophen, ondansetron (ZOFRAN) IV, mouth rinse, sodium chloride flush   Vital Signs    Vitals:   04/18/22 0600 04/18/22 0702 04/18/22 0800 04/18/22 0827  BP: 104/76 106/61 107/85   Pulse: 71 78 80   Resp: (!) '8 18 14   '$ Temp:    98.1 F (36.7 C)  TempSrc:    Oral  SpO2: 91% 94% 96%     Intake/Output Summary (Last 24 hours) at 04/18/2022 0907 Last data filed at 04/18/2022 0200 Gross per 24 hour  Intake --  Output 1760 ml  Net -1760 ml      04/09/2022    2:44 PM 03/30/2022    1:05 AM 03/29/2022    5:00 AM  Last 3 Weights  Weight (lbs) 214 lb 216 lb 0.8 oz 217 lb 13 oz  Weight (kg) 97.07 kg 98 kg 98.8 kg      Telemetry    NSR  - Personally Reviewed  ECG    NSR, rate 69, inferior Q waves, poor R wave progression - Personally Reviewed  Physical Exam   GEN: No acute distress.   Neck: No JVD Cardiac: RRR, no murmurs, rubs, or gallops.  Respiratory: Clear to auscultation bilaterally. GI: Soft, nontender, non-distended  MS: No edema; femoral cath site c/d/i Neuro:  Nonfocal  Psych: Normal affect   Labs    High Sensitivity Troponin:   Recent Labs  Lab 03/19/22 1855 03/19/22 2250 03/20/22 0010 04/17/22 1238 04/17/22 2143  TROPONINIHS 52* 34* 37* 19* 2,080*     Chemistry Recent Labs  Lab  04/17/22 1238 04/17/22 1311 04/17/22 1313 04/18/22 0611  NA 140 138 140 141  K 3.7 3.8 3.6 4.1  CL 103 102 100 104  CO2 26  --   --  27  GLUCOSE 119* 125* 120* 120*  BUN 8 7* 9 8  CREATININE 0.97 0.70 0.90 0.88  CALCIUM 9.7  --   --  9.9  PROT 6.9  --   --   --   ALBUMIN 4.3  --   --   --   AST 18  --   --   --   ALT 14  --   --   --   ALKPHOS 108  --   --   --   BILITOT 0.5  --   --   --   GFRNONAA >60  --   --  >60  ANIONGAP 11  --   --  10    Lipids  Recent Labs  Lab 04/17/22 1238  CHOL 165  TRIG 123  HDL 79  LDLCALC 61  CHOLHDL 2.1    Hematology Recent Labs  Lab 04/17/22 1238 04/17/22 1311 04/17/22 1313 04/18/22 0611  WBC 8.7  --   --  7.2  RBC 4.77  --   --  4.76  HGB 15.0 13.3 14.6 15.0  HCT 42.8 39.0 43.0 42.5  MCV 89.7  --   --  89.3  MCH 31.4  --   --  31.5  MCHC 35.0  --   --  35.3  RDW 12.8  --   --  12.7  PLT 267  --   --  230   Thyroid No results for input(s): "TSH", "FREET4" in the last 168 hours.  BNPNo results for input(s): "BNP", "PROBNP" in the last 168 hours.  DDimer No results for input(s): "DDIMER" in the last 168 hours.   Radiology    DG Chest Port 1 View  Result Date: 04/17/2022 CLINICAL DATA:  Chest pain EXAM: PORTABLE CHEST 1 VIEW COMPARISON:  04/09/2022 FINDINGS: Prior CABG. Heart and mediastinal contours are within normal limits. No focal opacities or effusions. No acute bony abnormality. IMPRESSION: No active disease. Electronically Signed   By: Rolm Baptise M.D.   On: 04/17/2022 22:16   CARDIAC CATHETERIZATION  Result Date: 04/17/2022   Prox LAD lesion is 90% stenosed.  LIMA to LAD is widely patent.   Ost Cx to Prox Cx lesion is 75% stenosed.  The SVG to ramus portion of the jump graft is widely patent.   1st Mrg-1 lesion is 25% stenosed.   1st Mrg-2 lesion is 99% stenosed at the insertion site of the SVG.   Balloon angioplasty was performed using a BALLN SAPPHIRE 2.0X12.   Post intervention, there is a 0% residual stenosis.    LV end diastolic pressure is mildly elevated.   There is no aortic valve stenosis. Successful PTCA of the subtotal occlusion of the SVG to OM insertion site.  TIMI-3 flow restored.  Patient's symptoms resolved. Antiplatelet therapy is complicated due to his phenytoin use.  After discussion with pharmacy, will plan to change clopidogrel over to Effient.  Brilinta was initially given for rapid antiplatelet therapy but given chronic phenytoin use, the Brilinta effect will be diminished.  We will load Effient 60 mg this evening.  Start daily Effient tomorrow.   ECHOCARDIOGRAM COMPLETE  Result Date: 04/17/2022    ECHOCARDIOGRAM REPORT   Patient Name:   Wayne Vang Date of Exam: 04/17/2022 Medical Rec #:  [id number]     Height:       74.0 in Accession #:    [id number]    Weight:       214.1 lb Date of Birth:  October 02, 1960      BSA:          2.237 m Patient Age:    61 years      BP:           -/- mmHg Patient Gender: M             HR:           100 bpm. Exam Location:  Inpatient Procedure: 2D Echo, Color Doppler and Cardiac Doppler STAT ECHO Indications:    R07.9* Chest pain, unspecified  History:        Patient has prior history of Echocardiogram examinations, most                 recent 03/26/2022. Prior CABG; Risk Factors:Dyslipidemia.  Sonographer:    Gambell Referring Phys: [name] MD  Sonographer Comments: STAT in ED prior to transfer  to cath lab, Drs Irish Lack and O'Neal present at bedside IMPRESSIONS  1. Limited Echo. Tachycardic making the EF/wall motion assessment challenging. Mild hypokinesis of the anteroseptal/anterior wall from the base to the apex. Left ventricular ejection fraction, by estimation, is 45 to 50%. The left ventricle has mildly decreased function.  2. Right ventricular systolic function is normal. The right ventricular size is not well visualized.  3. A small pericardial effusion is present. There is no evidence of cardiac tamponade.  4. No evidence of mitral valve  regurgitation.  5. The aortic valve is tricuspid. Aortic valve regurgitation is not visualized.  6. The inferior vena cava is normal in size with <50% respiratory variability, suggesting right atrial pressure of 8 mmHg. FINDINGS  Left Ventricle: Limited Echo. Tachycardic making the EF/wall motion assessment challenging. Mild hypokinesis of the anteroseptal/anterior wall from the base to the apex. Left ventricular ejection fraction, by estimation, is 45 to 50%. The left ventricle  has mildly decreased function. The left ventricular internal cavity size was normal in size. There is no left ventricular hypertrophy. Right Ventricle: The right ventricular size is not well visualized. Right ventricular systolic function is normal. Left Atrium: Left atrial size was normal in size. Right Atrium: Right atrial size was normal in size. Pericardium: A small pericardial effusion is present. There is no evidence of cardiac tamponade. Mitral Valve: No evidence of mitral valve regurgitation. Tricuspid Valve: Tricuspid valve regurgitation is not demonstrated. Aortic Valve: The aortic valve is tricuspid. Aortic valve regurgitation is not visualized. Pulmonic Valve: Pulmonic valve regurgitation is not visualized. Venous: The inferior vena cava is normal in size with less than 50% respiratory variability, suggesting right atrial pressure of 8 mmHg. IAS/Shunts: No atrial level shunt detected by color flow Doppler. Phineas Inches Electronically signed by Phineas Inches Signature Date/Time: 04/17/2022/1:06:43 PM    Final     Cardiac Studies     Patient Profile     61 y.o. male with history of CAD status post CABG 03/26/2022, seizures who presents with STEMI  Assessment & Plan     STEMI: Underwent CABG x3 on 03/26/2022 with LIMA-LAD, SVG sequentially to ramus and OM1.  Presented 9/28 with chest pain and ST elevations in inferior leads.  Had VF arrest in the ED.  ROSC obtained after 1 shock.  Cath showed patent LIMA-LAD, patent ramus  portion of jump graft but 99% stenosis at the insertion site of SVG to OM.  Underwent successful balloon angioplasty.  Echocardiogram showed EF 45 to 50%, small pericardial effusion -Given phenytoin use for his seizures, effect of Brilinta will be diminished.  This was discussed with pharmacy, recommended starting Effient.  Continue aspirin 81 mg daily -Continue rosuvastatin 40 mg daily -Restart metoprolol  VF arrest: Occurred in the ED, due to STEMI as above.  ROSC achieved after 1 shock  Acute systolic heart failure: EF 45 to 50% on echocardiogram 9/28.  EF 60 to 65% on echo 8/31. -Start Toprol-XL.  Plan to add losartan as BP tolerates  Hyperlipidemia: Continue rosuvastatin 40 mg daily.  LDL 61 04/17/22  Seizures: Continue home phenytoin (200 mg every morning, 300 mg nightly)  GERD: PPI  -Diet heart healthy -DVT PPx: SCDs -Code: Full   For questions or updates, please contact [place] Please consult [email or web address] for contact info under        Signed, Donato Heinz, MD  04/18/2022, 9:07 AM

## 2022-04-18 NOTE — Telephone Encounter (Signed)
Pharmacy Patient Advocate Encounter  Insurance verification completed.    The patient is insured through [employer]   The patient is currently admitted and ran test claims for the following: Prasugrel (Effient).  Copays and coinsurance results were relayed to Inpatient clinical team.

## 2022-04-18 NOTE — Progress Notes (Signed)
CARDIAC REHAB PHASE I   PRE:  Rate/Rhythm: 87 SR   BP:  Supine: 96/70   SaO2: 99  MODE:  Ambulation: 470 ft   POST:  Rate/Rhythm: 88 SR  BP:  Sitting: 122/89   SaO2: 99  Pt received in bed, agreeable to ambulation and education with wife in room. Pt ambulated in hallway independently with standby assist, tolerated well with no s/s. Pt and family educated on risk factors, exercise guidelines, angina/NTG, angioplasty, restrictions, nutrition, and orientation to CRP2 referral to [facility]. Pt voiced understanding, all questions from pt and family answered. Pt left in bed with call bell in reach.  [place], MS 04/18/2022 3:36 PM

## 2022-04-18 NOTE — Progress Notes (Signed)
Rounding Note    Patient Name: Wayne Vang Date of Encounter: 04/19/2022  [place] Cardiologist: Donato Heinz, MD   Subjective   Complaining of constipation this morning. Otherwise feels well and is hoping to go home.  Inpatient Medications    Scheduled Meds:  aspirin  81 mg Oral Daily   Chlorhexidine Gluconate Cloth  6 each Topical Q0600   metoprolol succinate  25 mg Oral Daily   pantoprazole  40 mg Oral Daily   phenytoin  200 mg Oral q AM   And   phenytoin  300 mg Oral QPM   prasugrel  10 mg Oral Daily   rosuvastatin  40 mg Oral Daily   sodium chloride flush  3 mL Intravenous Q12H   Continuous Infusions:  sodium chloride     sodium chloride     PRN Meds: sodium chloride, acetaminophen, ondansetron (ZOFRAN) IV, mouth rinse, sodium chloride flush   Vital Signs    Vitals:   04/18/22 2017 04/18/22 2327 04/19/22 0439 04/19/22 0615  BP: (!) 128/94 95/71 103/69   Pulse: 95 90 78   Resp: '19 14 15   '$ Temp: 97.9 F (36.6 C) 97.9 F (36.6 C) 97.6 F (36.4 C)   TempSrc: Oral Oral Oral   SpO2: 100% 97% 100%   Weight:    97.3 kg    Intake/Output Summary (Last 24 hours) at 04/19/2022 0643 Last data filed at 04/18/2022 0800 Gross per 24 hour  Intake --  Output 900 ml  Net -900 ml      04/19/2022    6:15 AM 04/09/2022    2:44 PM 03/30/2022    1:05 AM  Last 3 Weights  Weight (lbs) 214 lb 8.1 oz 214 lb 216 lb 0.8 oz  Weight (kg) 97.3 kg 97.07 kg 98 kg      Telemetry   NSR  - Personally Reviewed  ECG    No new tracing - Personally Reviewed  Physical Exam   GEN: No acute distress.  Comfortable Neck: No JVD Cardiac: RRR, no murmurs, rubs, or gallops.  Respiratory: Clear to auscultation bilaterally. GI: Soft, ND, NTTP MS: No edema; femoral cath site c/d/i Neuro:  Nonfocal  Psych: Normal affect   Labs    High Sensitivity Troponin:   Recent Labs  Lab 04/17/22 1238 04/17/22 2143  TROPONINIHS 19* 2,080*      Chemistry Recent Labs  Lab 04/17/22 1238 04/17/22 1311 04/17/22 1313 04/18/22 0611 04/19/22 0133  NA 140   < > 140 141 138  K 3.7   < > 3.6 4.1 4.3  CL 103   < > 100 104 104  CO2 26  --   --  27 24  GLUCOSE 119*   < > 120* 120* 105*  BUN 8   < > '9 8 15  '$ CREATININE 0.97   < > 0.90 0.88 0.97  CALCIUM 9.7  --   --  9.9 8.9  PROT 6.9  --   --   --   --   ALBUMIN 4.3  --   --   --   --   AST 18  --   --   --   --   ALT 14  --   --   --   --   ALKPHOS 108  --   --   --   --   BILITOT 0.5  --   --   --   --   GFRNONAA >60  --   --  >  60 >60  ANIONGAP 11  --   --  10 10   < > = values in this interval not displayed.    Lipids  Recent Labs  Lab 04/17/22 1238  CHOL 165  TRIG 123  HDL 79  LDLCALC 61  CHOLHDL 2.1    Hematology Recent Labs  Lab 04/17/22 1238 04/17/22 1311 04/17/22 1313 04/18/22 0611  WBC 8.7  --   --  7.2  RBC 4.77  --   --  4.76  HGB 15.0 13.3 14.6 15.0  HCT 42.8 39.0 43.0 42.5  MCV 89.7  --   --  89.3  MCH 31.4  --   --  31.5  MCHC 35.0  --   --  35.3  RDW 12.8  --   --  12.7  PLT 267  --   --  230   Thyroid No results for input(s): "TSH", "FREET4" in the last 168 hours.  BNPNo results for input(s): "BNP", "PROBNP" in the last 168 hours.  DDimer No results for input(s): "DDIMER" in the last 168 hours.   Radiology    DG Chest Port 1 View  Result Date: 04/17/2022 CLINICAL DATA:  Chest pain EXAM: PORTABLE CHEST 1 VIEW COMPARISON:  04/09/2022 FINDINGS: Prior CABG. Heart and mediastinal contours are within normal limits. No focal opacities or effusions. No acute bony abnormality. IMPRESSION: No active disease. Electronically Signed   By: Rolm Baptise M.D.   On: 04/17/2022 22:16   CARDIAC CATHETERIZATION  Result Date: 04/17/2022   Prox LAD lesion is 90% stenosed.  LIMA to LAD is widely patent.   Ost Cx to Prox Cx lesion is 75% stenosed.  The SVG to ramus portion of the jump graft is widely patent.   1st Mrg-1 lesion is 25% stenosed.   1st Mrg-2 lesion  is 99% stenosed at the insertion site of the SVG.   Balloon angioplasty was performed using a BALLN SAPPHIRE 2.0X12.   Post intervention, there is a 0% residual stenosis.   LV end diastolic pressure is mildly elevated.   There is no aortic valve stenosis. Successful PTCA of the subtotal occlusion of the SVG to OM insertion site.  TIMI-3 flow restored.  Patient's symptoms resolved. Antiplatelet therapy is complicated due to his phenytoin use.  After discussion with pharmacy, will plan to change clopidogrel over to Effient.  Brilinta was initially given for rapid antiplatelet therapy but given chronic phenytoin use, the Brilinta effect will be diminished.  We will load Effient 60 mg this evening.  Start daily Effient tomorrow.   ECHOCARDIOGRAM COMPLETE  Result Date: 04/17/2022    ECHOCARDIOGRAM REPORT   Patient Name:   Wayne Vang Date of Exam: 04/17/2022 Medical Rec #:  [id number]     Height:       74.0 in Accession #:    [id number]    Weight:       214.1 lb Date of Birth:  April 12, 1961      BSA:          2.237 m Patient Age:    61 years      BP:           -/- mmHg Patient Gender: M             HR:           100 bpm. Exam Location:  Inpatient Procedure: 2D Echo, Color Doppler and Cardiac Doppler STAT ECHO Indications:    R07.9* Chest pain, unspecified  History:  Patient has prior history of Echocardiogram examinations, most                 recent 03/26/2022. Prior CABG; Risk Factors:Dyslipidemia.  Sonographer:    Raquel Sarna Senior RDCS Referring Phys: [name] MD  Sonographer Comments: STAT in ED prior to transfer to cath lab, Drs Irish Lack and Loa present at bedside IMPRESSIONS  1. Limited Echo. Tachycardic making the EF/wall motion assessment challenging. Mild hypokinesis of the anteroseptal/anterior wall from the base to the apex. Left ventricular ejection fraction, by estimation, is 45 to 50%. The left ventricle has mildly decreased function.  2. Right ventricular systolic function is normal. The right  ventricular size is not well visualized.  3. A small pericardial effusion is present. There is no evidence of cardiac tamponade.  4. No evidence of mitral valve regurgitation.  5. The aortic valve is tricuspid. Aortic valve regurgitation is not visualized.  6. The inferior vena cava is normal in size with <50% respiratory variability, suggesting right atrial pressure of 8 mmHg. FINDINGS  Left Ventricle: Limited Echo. Tachycardic making the EF/wall motion assessment challenging. Mild hypokinesis of the anteroseptal/anterior wall from the base to the apex. Left ventricular ejection fraction, by estimation, is 45 to 50%. The left ventricle  has mildly decreased function. The left ventricular internal cavity size was normal in size. There is no left ventricular hypertrophy. Right Ventricle: The right ventricular size is not well visualized. Right ventricular systolic function is normal. Left Atrium: Left atrial size was normal in size. Right Atrium: Right atrial size was normal in size. Pericardium: A small pericardial effusion is present. There is no evidence of cardiac tamponade. Mitral Valve: No evidence of mitral valve regurgitation. Tricuspid Valve: Tricuspid valve regurgitation is not demonstrated. Aortic Valve: The aortic valve is tricuspid. Aortic valve regurgitation is not visualized. Pulmonic Valve: Pulmonic valve regurgitation is not visualized. Venous: The inferior vena cava is normal in size with less than 50% respiratory variability, suggesting right atrial pressure of 8 mmHg. IAS/Shunts: No atrial level shunt detected by color flow Doppler. Phineas Inches Electronically signed by Phineas Inches Signature Date/Time: 04/17/2022/1:06:43 PM    Final     Cardiac Studies   TTE 04/17/22: IMPRESSIONS   1. Limited Echo. Tachycardic making the EF/wall motion assessment  challenging. Mild hypokinesis of the anteroseptal/anterior wall from the  base to the apex. Left ventricular ejection fraction, by estimation, is  45  to 50%. The left ventricle has mildly  decreased function.   2. Right ventricular systolic function is normal. The right ventricular  size is not well visualized.   3. A small pericardial effusion is present. There is no evidence of  cardiac tamponade.   4. No evidence of mitral valve regurgitation.   5. The aortic valve is tricuspid. Aortic valve regurgitation is not  visualized.   6. The inferior vena cava is normal in size with <50% respiratory  variability, suggesting right atrial pressure of 8 mmHg.   LHC 04/17/22:   Prox LAD lesion is 90% stenosed.  LIMA to LAD is widely patent.   Ost Cx to Prox Cx lesion is 75% stenosed.  The SVG to ramus portion of the jump graft is widely patent.   1st Mrg-1 lesion is 25% stenosed.   1st Mrg-2 lesion is 99% stenosed at the insertion site of the SVG.   Balloon angioplasty was performed using a BALLN SAPPHIRE 2.0X12.   Post intervention, there is a 0% residual stenosis.   LV end diastolic pressure  is mildly elevated.   There is no aortic valve stenosis.   Successful PTCA of the subtotal occlusion of the SVG to OM insertion site.  TIMI-3 flow restored.  Patient's symptoms resolved.   Antiplatelet therapy is complicated due to his phenytoin use.  After discussion with pharmacy, will plan to change clopidogrel over to Effient.  Brilinta was initially given for rapid antiplatelet therapy but given chronic phenytoin use, the Brilinta effect will be diminished.  We will load Effient 60 mg this evening.  Start daily Effient tomorrow.  Patient Profile     61 y.o. male with history of CAD status post CABG 03/26/2022 and seizures who presented with STEMI and VF arrest found to have 99% stenosis at the insertion site of SVG to OM no s/p angioplasty with excellent results.   Assessment & Plan    #STEMI:  Underwent CABG x3 on 03/26/2022 with LIMA-LAD, SVG sequentially to ramus and OM1.  Presented 9/28 with chest pain and ST elevations in inferior leads.   Had VF arrest in the ED.  ROSC obtained after 1 shock.  Cath showed patent LIMA-LAD, patent ramus portion of jump graft but 99% stenosis at the insertion site of SVG to OM.  Underwent successful balloon angioplasty.  Echocardiogram showed EF 45 to 50%, small pericardial effusion -Given phenytoin use for his seizures, effect of Brilinta will be diminished.  This was discussed with pharmacy, recommended starting Effient.  Continue aspirin 81 mg daily -Continue rosuvastatin 40 mg daily -Continue metop '25mg'$  XL daily  #VF arrest:  Occurred in the setting of STEMI as detailed above. ROSC achieved after 1 shock in the ER. Now s/p revascularization as above. No significant ventricular ectopy on telemetry. -S/p angioplasty to culprit SVG-OM lesion -Continue metop '25mg'$  XL daily  #Acute systolic heart failure:  TTE with LVEF 45 to 50% on echocardiogram 9/28.  EF 60 to 65% on echo 8/31 and reported as 50-55% on pre-bypass TEE. Likely EF dropped in the setting of STEMI and VF arrest as above. Now s/p angioplasty to culprit lesion. Suspect EF will recover. Will plan for repeat TTE to reassess as outpatient. -Continue metop '25mg'$  XL daily -Cannot tolerate ARB/sprio for now due to soft blood pressures  #Hyperlipidemia:  -Continue rosuvastatin 40 mg daily  #Seizures:  -Continue home phenytoin (200 mg every morning, 300 mg nightly)  #GERD:  -Continue PPI  -Diet heart healthy -DVT PPx: SCDs -Code: Full  Will discharge home today on current medications. Will arrange for CV follow-up.   For questions or updates, please contact [place] Please consult [email or web address] for contact info under        Signed, Freada Bergeron, MD  04/19/2022, 6:43 AM

## 2022-04-18 NOTE — TOC Benefit Eligibility Note (Addendum)
Patient Teacher, English as a foreign language completed.    The patient is currently admitted and upon discharge could be taking prasugrel (Effient).  The current 30 day co-pay is $17.51.   The patient is currently admitted and upon discharge could be taking Farxiga 10 mg.  The current 30 day co-pay is $272.56   The patient is currently admitted and upon discharge could be taking Jardiance 10 mg.  The current 30 day co-pay is $286.05  The patient is insured through [place], [place] Patient [place] Patient Advocate Team Direct Number: [phone]  Fax: [phone]

## 2022-04-19 ENCOUNTER — Other Ambulatory Visit: Payer: Self-pay | Admitting: Student

## 2022-04-19 DIAGNOSIS — E782 Mixed hyperlipidemia: Secondary | ICD-10-CM | POA: Diagnosis not present

## 2022-04-19 DIAGNOSIS — I2119 ST elevation (STEMI) myocardial infarction involving other coronary artery of inferior wall: Secondary | ICD-10-CM | POA: Diagnosis not present

## 2022-04-19 DIAGNOSIS — I5020 Unspecified systolic (congestive) heart failure: Secondary | ICD-10-CM | POA: Diagnosis not present

## 2022-04-19 DIAGNOSIS — Z951 Presence of aortocoronary bypass graft: Secondary | ICD-10-CM

## 2022-04-19 HISTORY — DX: Unspecified systolic (congestive) heart failure: I50.20

## 2022-04-19 LAB — BASIC METABOLIC PANEL
Anion gap: 10 (ref 5–15)
BUN: 15 mg/dL (ref 8–23)
CO2: 24 mmol/L (ref 22–32)
Calcium: 8.9 mg/dL (ref 8.9–10.3)
Chloride: 104 mmol/L (ref 98–111)
Creatinine, Ser: 0.97 mg/dL (ref 0.61–1.24)
GFR, Estimated: >60 mL/min (ref >60)
Glucose, Bld: 105 mg/dL — ABNORMAL HIGH (ref 70–99)
Potassium: 4.3 mmol/L (ref 3.5–5.1)
Sodium: 138 mmol/L (ref 135–145)

## 2022-04-19 MED ORDER — PRASUGREL HCL 10 MG PO TABS: 10.0000 mg | ORAL_TABLET | Freq: Every day | ORAL | 3 refills | Status: DC

## 2022-04-19 MED ORDER — METOPROLOL SUCCINATE ER 25 MG PO TB24: 25.0000 mg | ORAL_TABLET | Freq: Every day | ORAL | 2 refills | Status: DC

## 2022-04-19 MED ORDER — PRASUGREL HCL 10 MG PO TABS: 10.0000 mg | ORAL_TABLET | Freq: Every day | ORAL | 1 refills | Status: DC

## 2022-04-19 MED ORDER — NITROGLYCERIN 0.4 MG SL SUBL: 0.4000 mg | SUBLINGUAL_TABLET | SUBLINGUAL | 2 refills | Status: DC | PRN

## 2022-04-19 MED ORDER — BISACODYL 10 MG RE SUPP
10.0000 mg | Freq: Once | RECTAL | Status: AC
  Administered 2022-04-19: 10 mg via RECTAL
  Filled 2022-04-19: qty 1

## 2022-04-19 NOTE — Progress Notes (Signed)
Mobility Specialist Progress Note:   04/19/22 0958  Mobility  Activity Ambulated independently in hallway  Level of Assistance Independent  Assistive Device None  Distance Ambulated (ft) 500 ft  Activity Response Tolerated well  $Mobility charge 1 Mobility   Pt received standing in doorway and eager. No complaints. Pt left ambulating in room with all needs met and call bell in reach.   Tilden Broz Mobility Specialist-Acute Rehab Secure Chat only

## 2022-04-19 NOTE — Discharge Instructions (Addendum)
Medication Changes: - STOP Plavix and START Effient '10mg'$  daily. - STOP Metoprolol tartrate (Lopressor) and START Metoprolol succinate (Toprol-XL) '25mg'$  daily.  Of note, a 30-day prescription of the Effient was sent into your [facility] on [address]. However, I will also send in a 90 day prescription to [facility].   Post STEMI: NO HEAVY LIFTING X 4 WEEKS. NO SEXUAL ACTIVITY X 4 WEEKS. NO DRIVING X 2 WEEKS. NO SOAKING BATHS, HOT TUBS, POOLS, ETC., X 7 DAYS.  Groin Site Care: Refer to this sheet in the next few weeks. These instructions provide you with information on caring for yourself after your procedure. Your caregiver may also give you more specific instructions. Your treatment has been planned according to current medical practices, but problems sometimes occur. Call your caregiver if you have any problems or questions after your procedure. HOME CARE INSTRUCTIONS You may shower 24 hours after the procedure. Remove the bandage (dressing) and gently wash the site with plain soap and water. Gently pat the site dry.  Do not apply powder or lotion to the site.  Do not sit in a bathtub, swimming pool, or whirlpool for 5 to 7 days.  No bending, squatting, or lifting anything over 10 pounds (4.5 kg) as directed by your caregiver.  Inspect the site at least twice daily.  Do not drive home if you are discharged the same day of the procedure. Have someone else drive you.  What to expect: Any bruising will usually fade within 1 to 2 weeks.  Blood that collects in the tissue (hematoma) may be painful to the touch. It should usually decrease in size and tenderness within 1 to 2 weeks.  SEEK IMMEDIATE MEDICAL CARE IF: You have unusual pain at the groin site or down the affected leg.  You have redness, warmth, swelling, or pain at the groin site.  You have drainage (other than a small amount of blood on the dressing).  You have chills.  You have a fever or persistent symptoms for more than  72 hours.  You have a fever and your symptoms suddenly get worse.  Your leg becomes pale, cool, tingly, or numb.  You have heavy bleeding from the site. Hold pressure on the site.

## 2022-04-19 NOTE — Progress Notes (Signed)
Patient discharged from hospital today following STEMI. 30 day prescription of Effient sent to local pharmacy but patient also requested 90-day prescription be sent to [facility]. Will send this in as requested. Please see discharge summary today for additional information.  Darreld Mclean, PA-C 04/19/2022 1:33 PM

## 2022-04-19 NOTE — Discharge Summary (Addendum)
Discharge Summary    Patient ID: Wayne Vang MRN: [id number]; DOB: 08-23-1960  Admit date: 04/17/2022 Discharge date: 04/19/2022  PCP:  Mcclard Loffler, MD   [place] [facility] Cardiologist:  Donato Heinz, MD   Discharge Diagnoses    Principal Problem:   Acute MI, inferior wall [facility]) Active Problems:   Hyperlipidemia   ST elevation myocardial infarction (STEMI) ([place])   Seizures ([place])   S/P CABG x 3   Ventricular fibrillation ([place])   HFrEF (heart failure with reduced ejection fraction) ([place])    Diagnostic Studies/Procedures    Echocardiogram 04/17/2022: Impressions: 1. Limited Echo. Tachycardic making the EF/wall motion assessment  challenging. Mild hypokinesis of the anteroseptal/anterior wall from the  base to the apex. Left ventricular ejection fraction, by estimation, is 45  to 50%. The left ventricle has mildly  decreased function.   2. Right ventricular systolic function is normal. The right ventricular  size is not well visualized.   3. A small pericardial effusion is present. There is no evidence of  cardiac tamponade.   4. No evidence of mitral valve regurgitation.   5. The aortic valve is tricuspid. Aortic valve regurgitation is not  visualized.   6. The inferior vena cava is normal in size with <50% respiratory  variability, suggesting right atrial pressure of 8 mmHg.  _______________  Left Cardiac Catheterization 04/17/2022:   Prox LAD lesion is 90% stenosed.  LIMA to LAD is widely patent.   Ost Cx to Prox Cx lesion is 75% stenosed.  The SVG to ramus portion of the jump graft is widely patent.   1st Mrg-1 lesion is 25% stenosed.   1st Mrg-2 lesion is 99% stenosed at the insertion site of the SVG.   Balloon angioplasty was performed using a BALLN SAPPHIRE 2.0X12.   Post intervention, there is a 0% residual stenosis.   LV end diastolic pressure is mildly elevated.   There is no aortic valve stenosis.   Successful PTCA of the  subtotal occlusion of the SVG to OM insertion site.  TIMI-3 flow restored.  Patient's symptoms resolved.   Antiplatelet therapy is complicated due to his phenytoin use.  After discussion with pharmacy, will plan to change clopidogrel over to Effient.  Brilinta was initially given for rapid antiplatelet therapy but given chronic phenytoin use, the Brilinta effect will be diminished.  We will load Effient 60 mg this evening.  Start daily Effient tomorrow.  Diagnostic Dominance: Right  Intervention        History of Present Illness     Wayne Vang is a 61 y.o. male with with a history of CAD s/p recent CABG x3 (LIMA-LAD and RSVG-RI-OM1) on 03/26/2022, GERD, hiatal hernia, low back pain, and seizure disorder who is followed by Dr. Gardiner Rhyme.  He was recently admitted from 03/19/2022 to 03/31/2022 after presenting with chest pain.  High-sensitivity troponin mildly elevated and peaked at 52 at that time.  Echo showed normal LV function and no regional wall motion abnormalities.  He underwent cardiac catheterization which showed left main equivalent disease with proximal LAD and ostial LCx disease.  CT surgery was consulted patient underwent CABG x3 with LIMA to LAD and sequential RSVG to ramus intermediate and OM1 on 03/26/2022.  He had expected postop volume overload and acute blood loss anemia but postop course was relatively unremarkable.  He was discharged on 03/31/2022.  He presented back to the ED on 04/17/2022 via EMS for further evaluation of sudden onset of severe  chest pain.  STEMI was called in the field.  While in the ED, he did have a VF arrest but ROSC was obtained after 1 shock.  He was taken immediately to the Cath Lab for emergent cardiac catheterization.  Hospital Course     Consultants: None   Inferior STEMI CAD s/p CABG Patient recently underwent CABG x3 with LIMA to LAD and control RSVG to ramus intermedius and OM1 on 03/26/2022.  He was discharged on 03/31/2022.  He presented back  with chest pain on 04/17/2022 and was found to have ST elevations in inferior leads.  High-sensitivity troponin 19 >> 2,080.  Urgent cardiac catheterization showed widely patent grafts with 99% stenosis of mid OM 2 at the insertion site of the SVG.  Patient underwent successful PTCA of this lesion with TIMI-3 flow restored and resolution of symptoms.  Echo showed LVEF of 45-50% with mild hypokinesis of the anterior septal/anterior wall from the base to the apex small pericardial effusion.  Antiplatelet therapy is complicated due to his phenytoin use for seizure disorder.  After discussion with pharmacy, plan was to change patient from Plavix to Effient.  Brilinta was initially given for rapid antiplatelet therapy but given chronic phenytoin use, the Brilinta 5 will be diminished.  Therefore, he was loaded with Effient on the evening of 04/17/2022 and started on maintenance dose the following day.  Patient tolerated the procedure well.  He will be discharged on DAPT with Aspirin and Effient.  Continue beta-blocker and high intensity statin.  Of note, I personally called [facility] on [address] and confirmed that they have Effient in stock. Will send a 30 day prescription there per patient's request. Will also send in a 90 day prescription to [facility].  VF Arrest Patient had a brief VF arrest in the ED and ROSC was achieved after 1 shock.  This occurred in the setting of inferior STEMI as detailed above.  He has had no significant ventricular rhythms on telemetry following revascularization.  Continue Toprol XL 25 mg daily.  Acute HFmrEF Echo submission showed LVEF of 45-50% with mild hypokinesis of the anterior septal/anterior wall from the base to the apex small pericardial effusion.  EF dropped from 60-65% on echo on 8/31 pre-bypass.  Likely due to inferior STEMI and VF arrest as above.  Suspect EF will recover now that he has been revascularized. Patient did not require any diuresis this admission.  Home  Lopressor was stopped and patient was switched to Toprol-XL 25mg  daily.  Unable to tolerate ARB or spironolactone at this time due to soft BP.  Recommend repeating Echo as an outpatient to reassess LV function.  Hyperlipidemia Lipid panel this admission: Total Cholesterol 165, Triglycerides 123, HDL 79, LDL 61. Continue Crestor 40mg  daily.  Seizure Disorder Continue home Phenytoin 200mg  in the AM and 300mg  in the PM.  Patient seen and examined by Dr. Johney Frame today and determined to be stable for discharge. Outpatient follow-up arranged. Medications as below.   Did the patient have an acute coronary syndrome (MI, NSTEMI, STEMI, etc) this admission?:  Yes                               AHA/ACC Clinical Performance & Quality Measures: Aspirin prescribed? - Yes ADP Receptor Inhibitor (Plavix/Clopidogrel, Brilinta/Ticagrelor or Effient/Prasugrel) prescribed (includes medically managed patients)? - Yes Beta Blocker prescribed? - Yes High Intensity Statin (Lipitor 40-80mg  or Crestor 20-40mg ) prescribed? - Yes EF assessed during  THIS hospitalization? - Yes For EF <40%, was ACEI/ARB prescribed? - Not Applicable (EF >/= 91%) For EF <40%, Aldosterone Antagonist (Spironolactone or Eplerenone) prescribed? - Not Applicable (EF >/= 66%) Cardiac Rehab Phase II ordered (including medically managed patients)? - Yes   _____________  Discharge Vitals Blood pressure 102/76, pulse 85, temperature 98.1 F (36.7 C), temperature source Oral, resp. rate 18, weight 97.3 kg, SpO2 98 %.  Filed Weights   04/19/22 0615  Weight: 97.3 kg    Labs & Radiologic Studies    CBC Recent Labs    04/17/22 1238 04/17/22 1311 04/17/22 1313 04/18/22 0611  WBC 8.7  --   --  7.2  NEUTROABS 5.0  --   --   --   HGB 15.0   < > 14.6 15.0  HCT 42.8   < > 43.0 42.5  MCV 89.7  --   --  89.3  PLT 267  --   --  230   < > = values in this interval not displayed.   Basic Metabolic Panel Recent Labs    04/18/22 0611  04/19/22 0133  NA 141 138  K 4.1 4.3  CL 104 104  CO2 27 24  GLUCOSE 120* 105*  BUN 8 15  CREATININE 0.88 0.97  CALCIUM 9.9 8.9   Liver Function Tests Recent Labs    04/17/22 1238  AST 18  ALT 14  ALKPHOS 108  BILITOT 0.5  PROT 6.9  ALBUMIN 4.3   No results for input(s): "LIPASE", "AMYLASE" in the last 72 hours. High Sensitivity Troponin:   Recent Labs  Lab 04/17/22 1238 04/17/22 2143  TROPONINIHS 19* 2,080*    BNP Invalid input(s): "POCBNP" D-Dimer No results for input(s): "DDIMER" in the last 72 hours. Hemoglobin A1C Recent Labs    04/17/22 1238  HGBA1C 4.4*   Fasting Lipid Panel Recent Labs    04/17/22 1238  CHOL 165  HDL 79  LDLCALC 61  TRIG 123  CHOLHDL 2.1   Thyroid Function Tests No results for input(s): "TSH", "T4TOTAL", "T3FREE", "THYROIDAB" in the last 72 hours.  Invalid input(s): "FREET3" _____________  DG Chest Port 1 View  Result Date: 04/17/2022 CLINICAL DATA:  Chest pain EXAM: PORTABLE CHEST 1 VIEW COMPARISON:  04/09/2022 FINDINGS: Prior CABG. Heart and mediastinal contours are within normal limits. No focal opacities or effusions. No acute bony abnormality. IMPRESSION: No active disease. Electronically Signed   By: Rolm Baptise M.D.   On: 04/17/2022 22:16   CARDIAC CATHETERIZATION  Result Date: 04/17/2022   Prox LAD lesion is 90% stenosed.  LIMA to LAD is widely patent.   Ost Cx to Prox Cx lesion is 75% stenosed.  The SVG to ramus portion of the jump graft is widely patent.   1st Mrg-1 lesion is 25% stenosed.   1st Mrg-2 lesion is 99% stenosed at the insertion site of the SVG.   Balloon angioplasty was performed using a BALLN SAPPHIRE 2.0X12.   Post intervention, there is a 0% residual stenosis.   LV end diastolic pressure is mildly elevated.   There is no aortic valve stenosis. Successful PTCA of the subtotal occlusion of the SVG to OM insertion site.  TIMI-3 flow restored.  Patient's symptoms resolved. Antiplatelet therapy is complicated  due to his phenytoin use.  After discussion with pharmacy, will plan to change clopidogrel over to Effient.  Brilinta was initially given for rapid antiplatelet therapy but given chronic phenytoin use, the Brilinta effect will be diminished.  We will load  Effient 60 mg this evening.  Start daily Effient tomorrow.   ECHOCARDIOGRAM COMPLETE  Result Date: 04/17/2022    ECHOCARDIOGRAM REPORT   Patient Name:   Wayne Vang Date of Exam: 04/17/2022 Medical Rec #:  [id number]     Height:       74.0 in Accession #:    [id number]    Weight:       214.1 lb Date of Birth:  07-Mar-1961      BSA:          2.237 m Patient Age:    35 years      BP:           -/- mmHg Patient Gender: M             HR:           100 bpm. Exam Location:  Inpatient Procedure: 2D Echo, Color Doppler and Cardiac Doppler STAT ECHO Indications:    R07.9* Chest pain, unspecified  History:        Patient has prior history of Echocardiogram examinations, most                 recent 03/26/2022. Prior CABG; Risk Factors:Dyslipidemia.  Sonographer:    Raquel Sarna Senior RDCS Referring Phys: [name] MD  Sonographer Comments: STAT in ED prior to transfer to cath lab, Drs Irish Lack and Bostic present at bedside IMPRESSIONS  1. Limited Echo. Tachycardic making the EF/wall motion assessment challenging. Mild hypokinesis of the anteroseptal/anterior wall from the base to the apex. Left ventricular ejection fraction, by estimation, is 45 to 50%. The left ventricle has mildly decreased function.  2. Right ventricular systolic function is normal. The right ventricular size is not well visualized.  3. A small pericardial effusion is present. There is no evidence of cardiac tamponade.  4. No evidence of mitral valve regurgitation.  5. The aortic valve is tricuspid. Aortic valve regurgitation is not visualized.  6. The inferior vena cava is normal in size with <50% respiratory variability, suggesting right atrial pressure of 8 mmHg. FINDINGS  Left Ventricle: Limited  Echo. Tachycardic making the EF/wall motion assessment challenging. Mild hypokinesis of the anteroseptal/anterior wall from the base to the apex. Left ventricular ejection fraction, by estimation, is 45 to 50%. The left ventricle  has mildly decreased function. The left ventricular internal cavity size was normal in size. There is no left ventricular hypertrophy. Right Ventricle: The right ventricular size is not well visualized. Right ventricular systolic function is normal. Left Atrium: Left atrial size was normal in size. Right Atrium: Right atrial size was normal in size. Pericardium: A small pericardial effusion is present. There is no evidence of cardiac tamponade. Mitral Valve: No evidence of mitral valve regurgitation. Tricuspid Valve: Tricuspid valve regurgitation is not demonstrated. Aortic Valve: The aortic valve is tricuspid. Aortic valve regurgitation is not visualized. Pulmonic Valve: Pulmonic valve regurgitation is not visualized. Venous: The inferior vena cava is normal in size with less than 50% respiratory variability, suggesting right atrial pressure of 8 mmHg. IAS/Shunts: No atrial level shunt detected by color flow Doppler. Phineas Inches Electronically signed by Phineas Inches Signature Date/Time: 04/17/2022/1:06:43 PM    Final    DG Chest 2 View  Result Date: 04/09/2022 CLINICAL DATA:  Status post CABG x3. EXAM: CHEST - 2 VIEW COMPARISON:  Chest two views [id number] FINDINGS: Status post median sternotomy and CABG. Cardiac silhouette is at the upper limits of normal size. Surgical clips overlie the left mediastinum and inferior cardiac  silhouette. Mild calcification within the aortic arch. The lungs are clear. No pleural effusion or pneumothorax. Mild multilevel degenerative disc changes of the thoracic spine. IMPRESSION: Status post median sternotomy and CABG.  No acute lung process. Electronically Signed   By: Yvonne Kendall M.D.   On: 04/09/2022 14:33   DG Chest 2 View  Result Date:  03/29/2022 CLINICAL DATA:  NSTEMI. EXAM: CHEST - 2 VIEW COMPARISON:  03/28/2022 FINDINGS: Lungs are hypoinflated with small amount of posterior bilateral pleural fluid likely improved. Likely mild associated left basilar atelectasis. Cardiomediastinal silhouette and remainder of the exam is unchanged. IMPRESSION: Improving small bilateral pleural effusions likely with associated left basilar atelectasis. Electronically Signed   By: Marin Olp M.D.   On: 03/29/2022 08:10   DG Chest Port 1 View  Result Date: 03/28/2022 CLINICAL DATA:  Status post CABG EXAM: PORTABLE CHEST 1 VIEW COMPARISON:  Chest x-ray dated March 27, 2022 FINDINGS: Interval removal of PA catheter with right IJ line sheath remaining in place. Mediastinal drains and left-sided chest tube in place. Left-greater-than-right bibasilar atelectasis and trace left pleural effusion. No evidence of pneumothorax. IMPRESSION: Left-greater-than-right bibasilar atelectasis and trace left pleural effusion. Electronically Signed   By: Yetta Glassman M.D.   On: 03/28/2022 08:10   DG Chest Port 1 View  Result Date: 03/27/2022 CLINICAL DATA:  Status post coronary artery bypass surgery EXAM: PORTABLE CHEST 1 VIEW COMPARISON:  Previous studies including the examination of 03/26/2022 FINDINGS: There is interval removal of endotracheal tube and enteric tube. Tip of the Swan-Ganz catheter is seen in the region of main pulmonary artery. Mediastinal drains and left chest tube appear stable. There is poor inspiration. There are linear densities in left lower lung field suggesting subsegmental atelectasis. Left lateral CP angle is indistinct. There is no pneumothorax. IMPRESSION: Linear densities in left lower lung fields suggest subsegmental atelectasis. Possible small left pleural effusion. Electronically Signed   By: Elmer Picker M.D.   On: 03/27/2022 08:19   ECHO INTRAOPERATIVE TEE  Result Date: 03/26/2022  *INTRAOPERATIVE TRANSESOPHAGEAL REPORT *   Patient Name:   Wayne Vang Date of Exam: 03/26/2022 Medical Rec #:  [id number]       Height:       74.0 in Accession #:    [id number]      Weight:       220.4 lb Date of Birth:  05-05-1961        BSA:          2.27 m Patient Age:    89 years        BP:           112/83 mmHg Patient Gender: M               HR:           52 bpm. Exam Location:  Anesthesiology Transesophogeal exam was perform intraoperatively during surgical procedure. Patient was closely monitored under general anesthesia during the entirety of examination. Indications:     CAD Native Vessel i25.10 Sonographer:     Raquel Sarna Senior RDCS Performing Phys: [id number] [name] Diagnosing Phys: Nolon Nations MD Complications: No known complications during this procedure. POST-OP IMPRESSIONS _ Left Ventricle: The left ventricle is unchanged from pre-bypass. _ Right Ventricle: The right ventricle appears unchanged from pre-bypass. _ Aorta: The aorta appears unchanged from pre-bypass. _ Left Atrial Appendage: The left atrial appendage appears unchanged from pre-bypass. _ Aortic Valve: The aortic valve appears unchanged from pre-bypass. _ Mitral Valve:  The mitral valve appears unchanged from pre-bypass. _ Tricuspid Valve: The tricuspid valve appears unchanged from pre-bypass. _ Pulmonic Valve: The pulmonic valve appears unchanged from pre-bypass. _ Interatrial Septum: The interatrial septum appears unchanged from pre-bypass. PRE-OP FINDINGS  Left Ventricle: The left ventricle has low normal systolic function, with an ejection fraction of 50-55%. The cavity size was normal. There is no left ventricular hypertrophy. Right Ventricle: The right ventricle has normal systolic function. The cavity was normal. There is no increase in right ventricular wall thickness. Left Atrium: Left atrial size was normal in size. No left atrial/left atrial appendage thrombus was detected. The left atrial appendage is well visualized and there is no evidence of thrombus present.  Right Atrium: Right atrial size was normal in size. Interatrial Septum: No atrial level shunt detected by color flow Doppler. There is no evidence of a patent foramen ovale. Pericardium: There is no evidence of pericardial effusion. Mitral Valve: The mitral valve is normal in structure. Mitral valve regurgitation is trivial by color flow Doppler. There is no evidence of mitral valve vegetation. There is mild thickening present with normal mobility. Tricuspid Valve: The tricuspid valve was normal in structure. Tricuspid valve regurgitation is trivial by color flow Doppler. There is no evidence of tricuspid valve vegetation. Aortic Valve: The aortic valve is tricuspid Aortic valve regurgitation is trivial by color flow Doppler. There is no stenosis of the aortic valve. There is no evidence of aortic valve vegetation. Pulmonic Valve: The pulmonic valve was normal in structure. Pulmonic valve regurgitation is trivial by color flow Doppler. Aorta: There is evidence of plaque in the descending aorta; Grade I, measuring 1-3mm in size. Shunts: There is no evidence of an atrial septal defect. +-------------+-----------++ AORTIC VALVE             +-------------+-----------++ AV Vmax:     121.00 cm/s +-------------+-----------++ AV Vmean:    78.800 cm/s +-------------+-----------++ AV VTI:      0.357 m     +-------------+-----------++ AV Peak Grad:5.9 mmHg    +-------------+-----------++ AV Mean Grad:3.0 mmHg    +-------------+-----------++  Nolon Nations MD Electronically signed by Nolon Nations MD Signature Date/Time: 03/26/2022/2:26:18 PM    Final    DG Chest Port 1 View  Result Date: 03/26/2022 CLINICAL DATA:  Bypass surgery. EXAM: PORTABLE CHEST 1 VIEW COMPARISON:  03/21/2022 FINDINGS: The endotracheal tube is 4.5 cm above the carina. The right IJ Swan-Ganz catheter tip is in the proximal right pulmonary artery. The NG tube is coursing down the esophagus and into the stomach. Left-sided chest  tube and mediastinal drain tubes are in place. Streaky bibasilar atelectasis, left greater than right. No pleural effusions, pulmonary edema or pneumothorax. IMPRESSION: 1. Support apparatus in good position without complicating features. 2. Bibasilar atelectasis. Electronically Signed   By: Marijo Sanes M.D.   On: 03/26/2022 13:22   VAS US DOPPLER PRE CABG  Result Date: 03/22/2022 PREOPERATIVE VASCULAR EVALUATION Patient Name:  Wayne Vang  Date of Exam:   03/21/2022 Medical Rec #: [id number]        Accession #:    [id number] Date of Birth: November 02, 1960         Patient Gender: M Patient Age:   33 years Exam Location:  [facility] Procedure:      VAS US DOPPLER PRE CABG Referring Phys: [name] --------------------------------------------------------------------------------  Indications:      Pre-CABG. Risk Factors:     Hyperlipidemia, past history of smoking. Comparison Study: No prior study Performing  Technologist: Maudry Mayhew MHA, RDMS, RVT, RDCS  Examination Guidelines: A complete evaluation includes B-mode imaging, spectral Doppler, color Doppler, and power Doppler as needed of all accessible portions of each vessel. Bilateral testing is considered an integral part of a complete examination. Limited examinations for reoccurring indications may be performed as noted.  Right Carotid Findings: +----------+--------+--------+--------+----------------------+--------+           PSV cm/sEDV cm/sStenosisDescribe              Comments +----------+--------+--------+--------+----------------------+--------+ CCA Prox  90      20                                             +----------+--------+--------+--------+----------------------+--------+ CCA Distal77      22                                             +----------+--------+--------+--------+----------------------+--------+ ICA Prox  50      21              hyperechoic and smooth          +----------+--------+--------+--------+----------------------+--------+ ICA Distal89      36                                             +----------+--------+--------+--------+----------------------+--------+ ECA       75      7                                              +----------+--------+--------+--------+----------------------+--------+ +----------+--------+-------+----------------+------------+           PSV cm/sEDV cmsDescribe        Arm Pressure +----------+--------+-------+----------------+------------+ [id number]             Multiphasic, WNL             +----------+--------+-------+----------------+------------+ +---------+--------+--+--------+--+---------+ VertebralPSV cm/s45EDV cm/s14Antegrade +---------+--------+--+--------+--+---------+ Left Carotid Findings: +----------+--------+--------+--------+-----------------------+--------+           PSV cm/sEDV cm/sStenosisDescribe               Comments +----------+--------+--------+--------+-----------------------+--------+ CCA Prox  98      20                                              +----------+--------+--------+--------+-----------------------+--------+ CCA Distal110     31                                              +----------+--------+--------+--------+-----------------------+--------+ ICA Prox  48      15              smooth and heterogenous         +----------+--------+--------+--------+-----------------------+--------+ ICA Distal82      37                                              +----------+--------+--------+--------+-----------------------+--------+  ECA       81      16                                              +----------+--------+--------+--------+-----------------------+--------+ +----------+--------+--------+----------------+------------+ SubclavianPSV cm/sEDV cm/sDescribe        Arm Pressure  +----------+--------+--------+----------------+------------+           93              Multiphasic, WNL             +----------+--------+--------+----------------+------------+ +---------+--------+--+--------+--+---------+ VertebralPSV cm/s54EDV cm/s22Antegrade +---------+--------+--+--------+--+---------+  ABI Findings: +--------+------------------+-----+---------+--------+ Right   Rt Pressure (mmHg)IndexWaveform Comment  +--------+------------------+-----+---------+--------+ Brachial                       triphasic         +--------+------------------+-----+---------+--------+ PTA                            triphasic         +--------+------------------+-----+---------+--------+ DP                             triphasic         +--------+------------------+-----+---------+--------+ +--------+------------------+-----+---------+-------+ Left    Lt Pressure (mmHg)IndexWaveform Comment +--------+------------------+-----+---------+-------+ Brachial                       triphasic        +--------+------------------+-----+---------+-------+ PTA                            triphasic        +--------+------------------+-----+---------+-------+ DP                             triphasic        +--------+------------------+-----+---------+-------+  Right Doppler Findings: +-----------+--------+-----+---------+--------------------+ Site       PressureIndexDoppler  Comments             +-----------+--------+-----+---------+--------------------+ Brachial                triphasic                     +-----------+--------+-----+---------+--------------------+ Radial                  triphasic                     +-----------+--------+-----+---------+--------------------+ Ulnar                   triphasic                     +-----------+--------+-----+---------+--------------------+ Palmar Arch                      Within normal limits  +-----------+--------+-----+---------+--------------------+  Left Doppler Findings: +-----------+--------+-----+---------+--------------------+ Site       PressureIndexDoppler  Comments             +-----------+--------+-----+---------+--------------------+ Brachial                triphasic                     +-----------+--------+-----+---------+--------------------+  Radial                  triphasic                     +-----------+--------+-----+---------+--------------------+ Ulnar                   triphasic                     +-----------+--------+-----+---------+--------------------+ Palmar Arch                      Within normal limits +-----------+--------+-----+---------+--------------------+   Summary: Right Carotid: Velocities in the right ICA are consistent with a 1-39% stenosis. Left Carotid: Velocities in the left ICA are consistent with a 1-39% stenosis. Vertebrals:  Bilateral vertebral arteries demonstrate antegrade flow. Subclavians: Normal flow hemodynamics were seen in bilateral subclavian              arteries. Pedal pulses: Resting bilateral pedal arteries are triphasic. Right Upper Extremity: Doppler waveforms remain within normal limits with right radial compression. Doppler waveforms remain within normal limits with right ulnar compression. Left Upper Extremity: Doppler waveforms remain within normal limits with left radial compression. Doppler waveforms remain within normal limits with left ulnar compression.  Electronically signed by Monica Martinez MD on 03/22/2022 at 76:48:19 AM.    Final    DG Chest 2 View  Result Date: 03/21/2022 CLINICAL DATA:  Chest pain EXAM: CHEST - 2 VIEW COMPARISON:  Chest radiograph 03/19/2022 FINDINGS: The heart size and mediastinal contours are within normal limits. Both lungs are clear. The visualized skeletal structures are unremarkable. IMPRESSION: No active cardiopulmonary disease. Electronically Signed   By: Lovey Newcomer  M.D.   On: 03/21/2022 13:10   CT CHEST WO CONTRAST  Result Date: 03/21/2022 CLINICAL DATA:  Preoperative evaluation. EXAM: CT CHEST WITHOUT CONTRAST TECHNIQUE: Multidetector CT imaging of the chest was performed following the standard protocol without IV contrast. RADIATION DOSE REDUCTION: This exam was performed according to the departmental dose-optimization program which includes automated exposure control, adjustment of the mA and/or kV according to patient size and/or use of iterative reconstruction technique. COMPARISON:  None Available. FINDINGS: Cardiovascular: Heart is normal in size. Coronary arterial vascular calcifications. Distal transverse aortic vascular calcifications. No pericardial effusion. Mediastinum/Nodes: No enlarged axillary, mediastinal or hilar lymphadenopathy. Lungs/Pleura: Minimal left basilar atelectasis. No large area pulmonary consolidation. No pleural effusion or pneumothorax. Upper Abdomen: No acute process. Musculoskeletal: Thoracic spine degenerative changes. No aggressive or acute appearing osseous lesions. IMPRESSION: No acute process. Distal transverse thoracic aortic calcifications. Coronary arterial vascular calcifications. Electronically Signed   By: Lovey Newcomer M.D.   On: 03/21/2022 09:54   CARDIAC CATHETERIZATION  Result Date: 03/20/2022   Prox LAD lesion is 90% stenosed.   1st Mrg lesion is 25% stenosed.   Ost Cx to Prox Cx lesion is 75% stenosed.   The left ventricular systolic function is normal.   LV end diastolic pressure is normal.   The left ventricular ejection fraction is 55-65% by visual estimate.   There is no aortic valve stenosis. Left main equivalent disease with proximal LAD and ostial circumflex disease. Will obtain cardiac surgery consult.  Restart IV heparin 2 hours after TR band removal.   Disposition   Patient is being discharged home today in good condition.  Follow-up Plans & Appointments     Follow-up Information     Ledora Bottcher, PA Follow up.  Specialties: Physician Assistant, Cardiology, Radiology Why: Hospital follow-up with Cardiology scheduled for 04/21/2022 at 10:55am. Please arrive 15 minutes early for check-in. If this date/time does not work for you, please call our office to reschedule. Contact information: [address] [place] [address] [phone]                Discharge Instructions     Amb Referral to Cardiac Rehabilitation   Complete by: As directed    Diagnosis:  STEMI PTCA     After initial evaluation and assessments completed: Virtual Based Care may be provided alone or in conjunction with Phase 2 Cardiac Rehab based on patient barriers.: Yes   Intensive Cardiac Rehabilitation (ICR) [place] location only OR Traditional Cardiac Rehabilitation (TCR) *If criteria for ICR are not met will enroll in TCR [facility] only): Yes   Diet - low sodium heart healthy   Complete by: As directed    Discharge wound care:   Complete by: As directed    Continue wound care instructions provided by CT surgery after recent CABG.   Increase activity slowly   Complete by: As directed        Discharge Medications   Allergies as of 04/19/2022       Reactions   Nexium [esomeprazole] Other (See Comments)   Abdominal pain   Zocor [simvastatin] Other (See Comments)   Ineffective    Gaviscon [alum Hydroxide-mag Carbonate] Other (See Comments)   Medication interaction only        Medication List     STOP taking these medications    clopidogrel 75 MG tablet Commonly known as: PLAVIX   metoprolol tartrate 25 MG tablet Commonly known as: LOPRESSOR       TAKE these medications    acetaminophen 500 MG tablet Commonly known as: TYLENOL Take 1,000 mg by mouth daily as needed for mild pain or headache.   aspirin EC 81 MG tablet Take 1 tablet (81 mg total) by mouth daily. Swallow whole.   lansoprazole 30 MG capsule Commonly known as: PREVACID Take 1 capsule (30 mg total)  by mouth daily before breakfast.   metoprolol succinate 25 MG 24 hr tablet Commonly known as: TOPROL-XL Take 1 tablet (25 mg total) by mouth daily. Start taking on: April 20, 2022   nitroGLYCERIN 0.4 MG SL tablet Commonly known as: Nitrostat Place 1 tablet (0.4 mg total) under the tongue every 5 (five) minutes as needed for chest pain.   oxyCODONE 5 MG immediate release tablet Commonly known as: Oxy IR/ROXICODONE Take 5 mg by mouth every 6 (six) hours as needed for severe pain.   phenytoin 100 MG ER capsule Commonly known as: DILANTIN Take 2-3 capsules (200-300 mg total) by mouth See admin instructions. Take 2 capsules (200 mg) every morning and then take 3 capsules (300 mg) at bedtime What changed: additional instructions   prasugrel 10 MG Tabs tablet Commonly known as: EFFIENT Take 1 tablet (10 mg total) by mouth daily. Start taking on: April 20, 2022   rosuvastatin 40 MG tablet Commonly known as: CRESTOR Take 1 tablet (40 mg total) by mouth daily.   VITAMIN D-3 PO Take 1 tablet by mouth daily.               Discharge Care Instructions  (From admission, onward)           Start     Ordered   04/19/22 0000  Discharge wound care:       Comments: Continue  wound care instructions provided by CT surgery after recent CABG.   04/19/22 1140               Outstanding Labs/Studies   N/A  Duration of Discharge Encounter   Greater than 30 minutes including physician time.  Signed, Darreld Mclean, PA-C 04/19/2022, 11:50 AM  Patient seen and examined and agree with Sande Rives, PA-C as detailed above. Please see progress note from today for full details.  Gwyndolyn Kaufman, MD

## 2022-04-21 ENCOUNTER — Encounter: Payer: Self-pay | Admitting: Physician Assistant

## 2022-04-21 ENCOUNTER — Ambulatory Visit: Payer: BC Managed Care – PPO | Attending: Physician Assistant | Admitting: Physician Assistant

## 2022-04-21 ENCOUNTER — Other Ambulatory Visit: Payer: Self-pay | Admitting: Surgical

## 2022-04-21 VITALS — BP 122/82 | HR 66 | Ht 74.0 in | Wt 216.2 lb

## 2022-04-21 DIAGNOSIS — Z951 Presence of aortocoronary bypass graft: Secondary | ICD-10-CM

## 2022-04-21 DIAGNOSIS — E782 Mixed hyperlipidemia: Secondary | ICD-10-CM

## 2022-04-21 DIAGNOSIS — E785 Hyperlipidemia, unspecified: Secondary | ICD-10-CM

## 2022-04-21 DIAGNOSIS — I2510 Atherosclerotic heart disease of native coronary artery without angina pectoris: Secondary | ICD-10-CM | POA: Diagnosis not present

## 2022-04-21 DIAGNOSIS — R569 Unspecified convulsions: Secondary | ICD-10-CM

## 2022-04-21 DIAGNOSIS — I5020 Unspecified systolic (congestive) heart failure: Secondary | ICD-10-CM

## 2022-04-21 DIAGNOSIS — I4901 Ventricular fibrillation: Secondary | ICD-10-CM

## 2022-04-21 MED ORDER — ROSUVASTATIN CALCIUM 40 MG PO TABS: 40.0000 mg | ORAL_TABLET | Freq: Every day | ORAL | 1 refills | Status: DC

## 2022-04-21 MED ORDER — ASPIRIN 81 MG PO TBEC: 81.0000 mg | DELAYED_RELEASE_TABLET | Freq: Every day | ORAL | Status: AC

## 2022-04-21 MED ORDER — PRASUGREL HCL 10 MG PO TABS: 10.0000 mg | ORAL_TABLET | Freq: Every day | ORAL | 3 refills | Status: DC

## 2022-04-21 MED ORDER — METOPROLOL SUCCINATE ER 25 MG PO TB24: 25.0000 mg | ORAL_TABLET | Freq: Every day | ORAL | 2 refills | Status: DC

## 2022-04-21 NOTE — Patient Instructions (Signed)
Medication Instructions:  NONE *If you need a refill on your cardiac medications before your next appointment, please call your pharmacy*   Lab Work: NONE If you have labs (blood work) drawn today and your tests are completely normal, you will receive your results only by: [place] (if you have MyChart) OR A paper copy in the mail If you have any lab test that is abnormal or we need to change your treatment, we will call you to review the results.   Testing/Procedures: NONE   Follow-Up: At [facility], you and your health needs are our priority.  As part of our continuing mission to provide you with exceptional heart care, we have created designated Provider Care Teams.  These Care Teams include your primary Cardiologist (physician) and Advanced Practice Providers (APPs -  Physician Assistants and Nurse Practitioners) who all work together to provide you with the care you need, when you need it.  We recommend signing up for the patient portal called "MyChart".  Sign up information is provided on this After Visit Summary.  MyChart is used to connect with patients for Virtual Visits (Telemedicine).  Patients are able to view lab/test results, encounter notes, upcoming appointments, etc.  Non-urgent messages can be sent to your provider as well.   To learn more about what you can do with MyChart, go to [email or web address].    Your next appointment:   6 week(s)  The format for your next appointment:   In Person  Provider:   Donato Heinz, MD  or Fabian Sharp, PA-C

## 2022-04-22 ENCOUNTER — Other Ambulatory Visit: Payer: Self-pay | Admitting: Surgical

## 2022-04-22 ENCOUNTER — Other Ambulatory Visit: Payer: Self-pay | Admitting: Family Medicine

## 2022-04-22 ENCOUNTER — Telehealth: Payer: Self-pay

## 2022-04-22 NOTE — Telephone Encounter (Signed)
Transition Care Management Follow-up Telephone Call Date of discharge and from where: 04/19/2022  How have you been since you were released from the hospital? Doing ok but sill having some fatigue and weakness. Any questions or concerns? No  Items Reviewed: Did the pt receive and understand the discharge instructions provided? Yes  Medications obtained and verified? Yes  Other? No  Any new allergies since your discharge? Yes  Dietary orders reviewed? Yes has been working diet changes. Received list of diet and following as well as he can  Do you have support at home? Yes   Home Care and Equipment/Supplies: Were home health services ordered? No  If so, what is the name of the agency?  Has the agency set up a time to come to the patient's home? not applicable Were any new equipment or medical supplies ordered?  No What is the name of the medical supply agency? N/A Were you able to get the supplies/equipment? not applicable Do you have any questions related to the use of the equipment or supplies? No  Functional Questionnaire: (I = Independent and D = Dependent) ADLs: Wife helps and is at home   Bathing/Dressing- D only with some areas. Limited mobility due to surgery   Meal Prep- D Wife helps with all   Eating- I  Maintaining continence- I  Transferring/Ambulation- D ok to to get up but limited due to surgical site   Managing Meds- I  Follow up appointments reviewed:  PCP Hospital f/u appt confirmed? Yes  Scheduled to see [place] on 04/30/2022 @ 2:20 . [facility] f/u appt confirmed? Yes  Scheduled to see Surgery on 04/28/2022 @ 1:30. Are transportation arrangements needed? No  If their condition worsens, is the pt aware to call PCP or go to the Emergency Dept.? No Was the patient provided with contact information for the PCP's office or ED? No Was to pt encouraged to call back with questions or concerns? No

## 2022-04-25 ENCOUNTER — Telehealth: Payer: Self-pay | Admitting: Family Medicine

## 2022-04-25 NOTE — Telephone Encounter (Signed)
Pt wife is calling. Requesting a refill on  phenytoin (DILANTIN) 100 MG ER capsule. Refill should be sent to [facility]

## 2022-04-28 ENCOUNTER — Ambulatory Visit (INDEPENDENT_AMBULATORY_CARE_PROVIDER_SITE_OTHER): Payer: Self-pay | Admitting: Thoracic Surgery (Cardiothoracic Vascular Surgery)

## 2022-04-28 ENCOUNTER — Encounter: Payer: Self-pay | Admitting: Thoracic Surgery (Cardiothoracic Vascular Surgery)

## 2022-04-28 VITALS — BP 138/80 | HR 73 | Resp 20 | Ht 74.0 in | Wt 218.2 lb

## 2022-04-28 DIAGNOSIS — Z951 Presence of aortocoronary bypass graft: Secondary | ICD-10-CM

## 2022-04-28 MED ORDER — PHENYTOIN SODIUM EXTENDED 100 MG PO CAPS: ORAL_CAPSULE | ORAL | 0 refills | Status: DC

## 2022-04-28 NOTE — Patient Instructions (Signed)
Follow up with cardiology and PCP as scheduled Return to work after 3 months Gradual increase in activity No lifting for 6 weeks

## 2022-04-28 NOTE — Telephone Encounter (Signed)
E[facility]. Pt next appt 07/16/22.

## 2022-04-28 NOTE — Progress Notes (Signed)
[address]       [place] [address]             [phone]           Jas P Desantis Stevenson Medical Record #[id number] Date of Birth: 30-Nov-1960  Correnti Loffler, MD [place] Loffler, MD  Chief Complaint:    Chief Complaint  Patient presents with   Follow-up    S/p CABGx3    History of Present Illness:     Pt is sp cabgx3 which was complicated with a return to the ER with a STEMI, cardiac arrest with requirement for one cardioversion, angioplasty of distal OM anastamosis. He had ER bedside echo with EF 45-50% (50-55% preop). He has been started on effient and has had no angina symptoms since ER visit. Is walking and active and just complains of stiffness in left shoulder and left breast area. He had a scab fall off upper incision with some clear drainage. Treated with local care and no longer an issue. No sternal movement. Interested in when return to work. 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"   Esophageal reflux    Hiatal hernia    Low back pain    Other and unspecified hyperlipidemia    Seizure disorder ([place])    "over 10 years ago"   Tremor     Past Surgical History:  Procedure Laterality Date   COLONOSCOPY     CORONARY ARTERY BYPASS GRAFT N/A 03/26/2022   Procedure: CORONARY ARTERY BYPASS GRAFTING (CABG) X THREE, USING LEFT INTERNAL MAMMARY ARTERY AND RIGHT LEG GREATER SAPHENOUS VEIN;  Surgeon: [name], MD;  Location: [facility];  Service: Open Heart Surgery;  Laterality: N/A;   CORONARY BALLOON ANGIOPLASTY N/A 04/17/2022   Procedure: CORONARY BALLOON ANGIOPLASTY;  Surgeon: Jettie Booze, MD;  Location: [facility];  Service: Cardiovascular;  Laterality: N/A;   INGUINAL HERNIA REPAIR Right 12/06/2013   Procedure:  REPAIR RIGHT INGUINAL HERNIA;  Surgeon: Joyice Faster. Cornett, MD;  Location: [facility];  Service: General;  Laterality: Right;    INGUINAL HERNIA REPAIR Left 07/21/2013   INSERTION OF MESH N/A 12/06/2013   Procedure: INSERTION OF MESH;  Surgeon: Joyice Faster. Cornett, MD;  Location: [facility];  Service: General;  Laterality: N/A;   LEFT HEART CATH AND CORONARY ANGIOGRAPHY N/A 03/20/2022   Procedure: LEFT HEART CATH AND CORONARY ANGIOGRAPHY;  Surgeon: Jettie Booze, MD;  Location: [facility];  Service: Cardiovascular;  Laterality: N/A;   LEFT HEART CATH AND CORONARY ANGIOGRAPHY N/A 04/17/2022   Procedure: LEFT HEART CATH AND CORONARY ANGIOGRAPHY;  Surgeon: Jettie Booze, MD;  Location: [facility];  Service: Cardiovascular;  Laterality: N/A;   LEFT HEART CATH AND CORS/GRAFTS ANGIOGRAPHY N/A 04/17/2022   Procedure: LEFT HEART CATH AND CORS/GRAFTS ANGIOGRAPHY;  Surgeon: Jettie Booze, MD;  Location: [facility];  Service: Cardiovascular;  Laterality: N/A;   POLYPECTOMY     TEE WITHOUT CARDIOVERSION N/A 03/26/2022   Procedure: TRANSESOPHAGEAL ECHOCARDIOGRAM (TEE);  Surgeon: [name], MD;  Location: [facility];  Service: Open Heart Surgery;  Laterality: N/A;    Social History   Tobacco Use  Smoking Status Former   Types: Cigarettes   Quit date: 07/21/2002   Years since quitting: 19.7  Smokeless Tobacco Never    Social History  Substance and Sexual Activity  Alcohol Use Yes   Comment: socially    Social History   Socioeconomic History   Marital status: Married    Spouse name: Not on file   Number of children: 1   Years of education: Not on file   Highest education level: Not on file  Occupational History   Occupation: Shop Printmaker: [employer]    Comment: [employer]  Tobacco Use   Smoking status: Former    Types: Cigarettes    Quit date: 07/21/2002    Years since quitting: 19.7   Smokeless tobacco: Never  Vaping Use   Vaping Use: Never used  Substance and Sexual Activity   Alcohol use: Yes    Comment: socially    Drug use: No   Sexual activity: Not on file  Other Topics Concern   Not on file  Social History Narrative      He has been married 31+ years.   Caffeine Use: He drinks coke, tea, and coffee.   Truck Dealer   Social Determinants of Health   Financial Resource Strain: Not on file  Food Insecurity: No Food Insecurity (03/29/2022)   Hunger Vital Sign    Worried About Running Out of Food in the Last Year: Never true    [place] in the Last Year: Never true  Transportation Needs: No Transportation Needs (03/29/2022)   PRAPARE - Hydrologist (Medical): No    Lack of Transportation (Non-Medical): No  Physical Activity: Not on file  Stress: Not on file  Social Connections: Not on file  Intimate Partner Violence: Not At Risk (03/29/2022)   Humiliation, Afraid, Rape, and Kick questionnaire    Fear of Current or Ex-Partner: No    Emotionally Abused: No    Physically Abused: No    Sexually Abused: No    Allergies  Allergen Reactions   Nexium [Esomeprazole] Other (See Comments)    Abdominal pain   Zocor [Simvastatin] Other (See Comments)    Ineffective     Gaviscon [Alum Hydroxide-Mag Carbonate] Other (See Comments)    Medication interaction only    Current Outpatient Medications  Medication Sig Dispense Refill   acetaminophen (TYLENOL) 500 MG tablet Take 1,000 mg by mouth daily as needed for mild pain or headache.     aspirin EC 81 MG tablet Take 1 tablet (81 mg total) by mouth daily. Swallow whole. 30 tablet    Cholecalciferol (VITAMIN D-3 PO) Take 1 tablet by mouth daily.     lansoprazole (PREVACID) 30 MG capsule TAKE 1 CAPSULE DAILY BEFORE BREAKFAST 90 capsule 3   metoprolol succinate (TOPROL-XL) 25 MG 24 hr tablet Take 1 tablet (25 mg total) by mouth daily. 90 tablet 2   nitroGLYCERIN (NITROSTAT) 0.4 MG SL tablet Place 1 tablet (0.4 mg total) under the tongue every 5 (five) minutes as needed for chest pain. 25 tablet 2   phenytoin (DILANTIN)  100 MG ER capsule Take 2-3 capsules (200-300 mg total) by mouth See admin instructions. Take 2 capsules (200 mg) every morning and then take 3 capsules (300 mg) at bedtime (Patient taking differently: Take 200-300 mg by mouth See admin instructions. 200 mg every morning, 300 mg at beditme)     phenytoin (DILANTIN) 100 MG ER capsule TAKE 2 CAPSULES BY MOUTH EVERY MORNING AND TAKE 3 CAPSULES AT BEDTIME. Must keep follow up 07/16/22 for ongoing refills 450 capsule 0   prasugrel (  EFFIENT) 10 MG TABS tablet Take 1 tablet (10 mg total) by mouth daily. 90 tablet 3   rosuvastatin (CRESTOR) 40 MG tablet Take 1 tablet (40 mg total) by mouth daily. 90 tablet 1   No current facility-administered medications for this visit.     Family History  Problem Relation Age of Onset   Alzheimer's disease Mother    Cancer Father    Colon polyps Sister    Stroke Sister    Alcohol abuse Brother    Colon cancer Neg Hx    Esophageal cancer Neg Hx    Stomach cancer Neg Hx    Rectal cancer Neg Hx       Physical Exam: BP 138/80 (BP Location: Right Arm, Patient Position: Sitting, Cuff Size: Normal)   Pulse 73   Resp 20   Ht '6\' 2"'$  (1.88 m)   Wt 218 lb 3.2 oz (99 kg)   SpO2 98% Comment: RA  BMI 28.02 kg/m   Lungs: decreased L base some. R clear Cardiac: RR with no murmurs. Has well healed eshcar on upper sternal incision. Sternum stable Ext: no edema   Diagnostic Studies & Laboratory data:     Recent Radiology Findings:   No results found.    Recent Lab Findings: Lab Results  Component Value Date   WBC 7.2 04/18/2022   HGB 15.0 04/18/2022   HCT 42.5 04/18/2022   PLT 230 04/18/2022   GLUCOSE 105 (H) 04/19/2022   CHOL 165 04/17/2022   TRIG 123 04/17/2022   HDL 79 04/17/2022   LDLDIRECT 194.1 04/27/2009   LDLCALC 61 04/17/2022   ALT 14 04/17/2022   AST 18 04/17/2022   NA 138 04/19/2022   K 4.3 04/19/2022   CL 104 04/19/2022   CREATININE 0.97 04/19/2022   BUN 15 04/19/2022   CO2 24  04/19/2022   TSH 1.321 03/19/2022   INR 1.0 04/17/2022   HGBA1C 4.4 (L) 04/17/2022      Assessment / Plan:      SP CABG with occluded distal OM post op requiring angioplasty/effient  Pt doing well. We discussed the a possible cause of the distal suture line stricture as from larger SVG used for grafting and some kinking resolved with angioplasty. He will remain on effient as per cardiology He was counseled on wound care and also stretching of upper body moving forward. He is not interested in cardiac rehab He will be returning to work after 60month from surgery All questions answered Has follow up with PCP and cardiology arranged      PCoralie Common10/03/2022 1:56 PM

## 2022-04-30 ENCOUNTER — Emergency Department (HOSPITAL_COMMUNITY): Payer: BC Managed Care – PPO

## 2022-04-30 ENCOUNTER — Inpatient Hospital Stay (HOSPITAL_COMMUNITY)
Admission: EM | Admit: 2022-04-30 | Discharge: 2022-05-02 | DRG: 287 | Disposition: A | Discharge status: Home / Self Care | Payer: BC Managed Care – PPO | Attending: Cardiology | Admitting: Cardiology

## 2022-04-30 ENCOUNTER — Inpatient Hospital Stay: Payer: BC Managed Care – PPO | Admitting: Family Medicine

## 2022-04-30 ENCOUNTER — Other Ambulatory Visit: Payer: Self-pay

## 2022-04-30 ENCOUNTER — Encounter (HOSPITAL_COMMUNITY): Payer: Self-pay

## 2022-04-30 DIAGNOSIS — Z955 Presence of coronary angioplasty implant and graft: Secondary | ICD-10-CM | POA: Diagnosis not present

## 2022-04-30 DIAGNOSIS — Z820 Family history of epilepsy and other diseases of the nervous system: Secondary | ICD-10-CM

## 2022-04-30 DIAGNOSIS — Z951 Presence of aortocoronary bypass graft: Secondary | ICD-10-CM

## 2022-04-30 DIAGNOSIS — E7800 Pure hypercholesterolemia, unspecified: Secondary | ICD-10-CM

## 2022-04-30 DIAGNOSIS — R079 Chest pain, unspecified: Secondary | ICD-10-CM | POA: Diagnosis present

## 2022-04-30 DIAGNOSIS — I25110 Atherosclerotic heart disease of native coronary artery with unstable angina pectoris: Principal | ICD-10-CM | POA: Diagnosis present

## 2022-04-30 DIAGNOSIS — Z809 Family history of malignant neoplasm, unspecified: Secondary | ICD-10-CM | POA: Diagnosis not present

## 2022-04-30 DIAGNOSIS — Z888 Allergy status to other drugs, medicaments and biological substances status: Secondary | ICD-10-CM | POA: Diagnosis not present

## 2022-04-30 DIAGNOSIS — E785 Hyperlipidemia, unspecified: Secondary | ICD-10-CM | POA: Diagnosis not present

## 2022-04-30 DIAGNOSIS — J9811 Atelectasis: Secondary | ICD-10-CM | POA: Diagnosis not present

## 2022-04-30 DIAGNOSIS — I25700 Atherosclerosis of coronary artery bypass graft(s), unspecified, with unstable angina pectoris: Secondary | ICD-10-CM | POA: Diagnosis present

## 2022-04-30 DIAGNOSIS — K219 Gastro-esophageal reflux disease without esophagitis: Secondary | ICD-10-CM | POA: Diagnosis not present

## 2022-04-30 DIAGNOSIS — Z83719 Family history of colon polyps, unspecified: Secondary | ICD-10-CM | POA: Diagnosis not present

## 2022-04-30 DIAGNOSIS — Z79899 Other long term (current) drug therapy: Secondary | ICD-10-CM

## 2022-04-30 DIAGNOSIS — Z886 Allergy status to analgesic agent status: Secondary | ICD-10-CM | POA: Diagnosis not present

## 2022-04-30 DIAGNOSIS — I200 Unstable angina: Secondary | ICD-10-CM | POA: Diagnosis not present

## 2022-04-30 DIAGNOSIS — Z7982 Long term (current) use of aspirin: Secondary | ICD-10-CM

## 2022-04-30 DIAGNOSIS — I252 Old myocardial infarction: Secondary | ICD-10-CM

## 2022-04-30 DIAGNOSIS — Z87891 Personal history of nicotine dependence: Secondary | ICD-10-CM | POA: Diagnosis not present

## 2022-04-30 DIAGNOSIS — Z823 Family history of stroke: Secondary | ICD-10-CM | POA: Diagnosis not present

## 2022-04-30 DIAGNOSIS — I5022 Chronic systolic (congestive) heart failure: Secondary | ICD-10-CM | POA: Diagnosis not present

## 2022-04-30 DIAGNOSIS — Z811 Family history of alcohol abuse and dependence: Secondary | ICD-10-CM

## 2022-04-30 DIAGNOSIS — E782 Mixed hyperlipidemia: Secondary | ICD-10-CM | POA: Diagnosis not present

## 2022-04-30 DIAGNOSIS — R0789 Other chest pain: Secondary | ICD-10-CM | POA: Diagnosis not present

## 2022-04-30 DIAGNOSIS — G40909 Epilepsy, unspecified, not intractable, without status epilepticus: Secondary | ICD-10-CM | POA: Diagnosis not present

## 2022-04-30 LAB — CBC WITH DIFFERENTIAL/PLATELET
Abs Immature Granulocytes: 0.02 10*3/uL (ref 0.00–0.07)
Basophils Absolute: 0.1 10*3/uL (ref 0.0–0.1)
Basophils Relative: 1 %
Eosinophils Absolute: 0.2 10*3/uL (ref 0.0–0.5)
Eosinophils Relative: 3 %
HCT: 41.6 % (ref 39.0–52.0)
Hemoglobin: 14.4 g/dL (ref 13.0–17.0)
Immature Granulocytes: 0 %
Lymphocytes Relative: 19 %
Lymphs Abs: 1.8 10*3/uL (ref 0.7–4.0)
MCH: 31.1 pg (ref 26.0–34.0)
MCHC: 34.6 g/dL (ref 30.0–36.0)
MCV: 89.8 fL (ref 80.0–100.0)
Monocytes Absolute: 0.5 10*3/uL (ref 0.1–1.0)
Monocytes Relative: 6 %
Neutro Abs: 6.8 10*3/uL (ref 1.7–7.7)
Neutrophils Relative %: 71 %
Platelets: 206 10*3/uL (ref 150–400)
RBC: 4.63 MIL/uL (ref 4.22–5.81)
RDW: 12.4 % (ref 11.5–15.5)
WBC: 9.4 10*3/uL (ref 4.0–10.5)
nRBC: 0 % (ref 0.0–0.2)

## 2022-04-30 LAB — BASIC METABOLIC PANEL
Anion gap: 9 (ref 5–15)
BUN: 14 mg/dL (ref 8–23)
CO2: 26 mmol/L (ref 22–32)
Calcium: 9.6 mg/dL (ref 8.9–10.3)
Chloride: 101 mmol/L (ref 98–111)
Creatinine, Ser: 0.93 mg/dL (ref 0.61–1.24)
GFR, Estimated: >60 mL/min (ref >60)
Glucose, Bld: 93 mg/dL (ref 70–99)
Potassium: 4.3 mmol/L (ref 3.5–5.1)
Sodium: 136 mmol/L (ref 135–145)

## 2022-04-30 LAB — CBG MONITORING, ED: Glucose-Capillary: 82 mg/dL (ref 70–99)

## 2022-04-30 LAB — TROPONIN I (HIGH SENSITIVITY)
Troponin I (High Sensitivity): 6 ng/L (ref <18)
Troponin I (High Sensitivity): 8 ng/L (ref <18)
Troponin I (High Sensitivity): 15 ng/L (ref <18)

## 2022-04-30 MED ORDER — PHENYTOIN SODIUM EXTENDED 100 MG PO CAPS
200.0000 mg | ORAL_CAPSULE | Freq: Every morning | ORAL | Status: DC
  Administered 2022-05-01 – 2022-05-02 (×2): 200 mg via ORAL
  Filled 2022-04-30 (×2): qty 2

## 2022-04-30 MED ORDER — HEPARIN BOLUS VIA INFUSION
4000.0000 [IU] | Freq: Once | INTRAVENOUS | Status: AC
  Administered 2022-04-30: 4000 [IU] via INTRAVENOUS
  Filled 2022-04-30: qty 4000

## 2022-04-30 MED ORDER — NITROGLYCERIN 0.4 MG SL SUBL: 0.4000 mg | SUBLINGUAL_TABLET | SUBLINGUAL | Status: DC | PRN

## 2022-04-30 MED ORDER — ONDANSETRON HCL 4 MG/2ML IJ SOLN: 4.0000 mg | Freq: Four times a day (QID) | INTRAMUSCULAR | Status: DC | PRN

## 2022-04-30 MED ORDER — ASPIRIN 81 MG PO TBEC: 81.0000 mg | DELAYED_RELEASE_TABLET | Freq: Every day | ORAL | Status: DC

## 2022-04-30 MED ORDER — PHENYTOIN SODIUM EXTENDED 100 MG PO CAPS: 200.0000 mg | ORAL_CAPSULE | ORAL | Status: DC

## 2022-04-30 MED ORDER — ISOSORBIDE MONONITRATE ER 30 MG PO TB24
30.0000 mg | ORAL_TABLET | Freq: Every day | ORAL | Status: DC
  Administered 2022-05-01 – 2022-05-02 (×2): 30 mg via ORAL
  Filled 2022-04-30 (×3): qty 1

## 2022-04-30 MED ORDER — PRASUGREL HCL 10 MG PO TABS
10.0000 mg | ORAL_TABLET | Freq: Every day | ORAL | Status: DC
  Administered 2022-05-01 – 2022-05-02 (×2): 10 mg via ORAL
  Filled 2022-04-30 (×2): qty 1

## 2022-04-30 MED ORDER — METOPROLOL SUCCINATE ER 25 MG PO TB24
25.0000 mg | ORAL_TABLET | Freq: Every day | ORAL | Status: DC
  Administered 2022-05-01 – 2022-05-02 (×2): 25 mg via ORAL
  Filled 2022-04-30 (×2): qty 1

## 2022-04-30 MED ORDER — PHENYTOIN SODIUM EXTENDED 100 MG PO CAPS
300.0000 mg | ORAL_CAPSULE | Freq: Every day | ORAL | Status: DC
  Administered 2022-04-30 – 2022-05-01 (×2): 300 mg via ORAL
  Filled 2022-04-30 (×3): qty 3

## 2022-04-30 MED ORDER — HEPARIN (PORCINE) 25000 UT/250ML-% IV SOLN
1250.0000 [IU]/h | INTRAVENOUS | Status: DC
  Administered 2022-04-30 – 2022-05-01 (×2): 1250 [IU]/h via INTRAVENOUS
  Filled 2022-04-30 (×2): qty 250

## 2022-04-30 MED ORDER — ASPIRIN 81 MG PO TBEC
81.0000 mg | DELAYED_RELEASE_TABLET | Freq: Every day | ORAL | Status: DC
  Administered 2022-05-01 – 2022-05-02 (×2): 81 mg via ORAL
  Filled 2022-04-30 (×2): qty 1

## 2022-04-30 MED ORDER — ACETAMINOPHEN 325 MG PO TABS: 650.0000 mg | ORAL_TABLET | ORAL | Status: DC | PRN

## 2022-04-30 MED ORDER — ROSUVASTATIN CALCIUM 20 MG PO TABS
40.0000 mg | ORAL_TABLET | Freq: Every day | ORAL | Status: DC
  Administered 2022-05-01 – 2022-05-02 (×2): 40 mg via ORAL
  Filled 2022-04-30 (×2): qty 2

## 2022-04-30 MED ORDER — PANTOPRAZOLE SODIUM 40 MG PO TBEC
40.0000 mg | DELAYED_RELEASE_TABLET | Freq: Every day | ORAL | Status: DC
  Administered 2022-05-01 – 2022-05-02 (×2): 40 mg via ORAL
  Filled 2022-04-30 (×2): qty 1

## 2022-04-30 NOTE — ED Triage Notes (Signed)
Patient states chest pain started around 1230. Patient had recent heart surgery and an nstemi a few weeks ago. Patient states it feels similar. Patient is alert and oriented x4 patient took a nitro and 4 baby asa

## 2022-04-30 NOTE — ED Provider Notes (Signed)
[facility] EMERGENCY DEPARTMENT Provider Note   CSN: [id number] Arrival date & time: 04/30/22  1504     History  Chief Complaint  Patient presents with   Chest Pain    Wayne Vang is a 61 y.o. male.  Patient is a 61 year old male with a past medical history of CAD status post CABG and stent placement and seizure disorder presenting to the emergency department with chest pain.  The patient states that he had a CABG performed approximately 1 month ago and had an NSTEMI requiring a stent 2 weeks ago.  Patient states that he was watching TV around noon today when he started to develop a pressure type pain in his lower chest that slightly radiated to the right side of his chest.  He states that it felt like something was stuck in his chest.  He states that he last ate an apple about an hour prior to this starting and feels like he completely swallowed the apple.  He states he had mild shortness of breath.  He denies any nausea, vomiting or diaphoresis.  He states that this pain does feel similar to when he had his heart attack.  States that he has been taking his medications as prescribed.  He states that he called EMS who recommended that he take 324 mg of aspirin and nitro which she states did improve his pain from approximately 4 out of 10 to a 1 out of 10.  The history is provided by the patient and the spouse.  Chest Pain      Home Medications Prior to Admission medications   Medication Sig Start Date End Date Taking? Authorizing Provider  acetaminophen (TYLENOL) 500 MG tablet Take 1,000 mg by mouth daily as needed for mild pain or headache.    [provider]  aspirin EC 81 MG tablet Take 1 tablet (81 mg total) by mouth daily. Swallow whole. 04/21/22   Duke, Tami Lin, PA  Cholecalciferol (VITAMIN D-3 PO) Take 1 tablet by mouth daily.    [provider]  lansoprazole (PREVACID) 30 MG capsule TAKE 1 CAPSULE DAILY BEFORE BREAKFAST 04/22/22    Copland, Frederico Hamman, MD  metoprolol succinate (TOPROL-XL) 25 MG 24 hr tablet Take 1 tablet (25 mg total) by mouth daily. 04/21/22   Duke, Tami Lin, PA  nitroGLYCERIN (NITROSTAT) 0.4 MG SL tablet Place 1 tablet (0.4 mg total) under the tongue every 5 (five) minutes as needed for chest pain. 04/19/22 04/19/23  Darreld Mclean, PA-C  phenytoin (DILANTIN) 100 MG ER capsule Take 2-3 capsules (200-300 mg total) by mouth See admin instructions. Take 2 capsules (200 mg) every morning and then take 3 capsules (300 mg) at bedtime Patient taking differently: Take 200-300 mg by mouth See admin instructions. 200 mg every morning, 300 mg at beditme 03/30/22   Gold, Cleveland E, PA-C  phenytoin (DILANTIN) 100 MG ER capsule TAKE 2 CAPSULES BY MOUTH EVERY MORNING AND TAKE 3 CAPSULES AT BEDTIME. Must keep follow up 07/16/22 for ongoing refills 04/28/22   Lomax, Amy, NP  prasugrel (EFFIENT) 10 MG TABS tablet Take 1 tablet (10 mg total) by mouth daily. 04/21/22   Duke, Tami Lin, PA  rosuvastatin (CRESTOR) 40 MG tablet Take 1 tablet (40 mg total) by mouth daily. 04/21/22   Duke, Tami Lin, PA      Allergies    Nexium [esomeprazole], Zocor [simvastatin], and Gaviscon [alum hydroxide-mag carbonate]    Review of Systems   Review of Systems  Cardiovascular:  Positive for chest pain.    Physical Exam Updated Vital Signs BP 119/87 (BP Location: Right Arm)   Pulse 77   Temp 98.2 F (36.8 C) (Oral)   Resp 18   Ht '6\' 2"'$  (1.88 m)   Wt 98.9 kg   SpO2 99%   BMI 27.99 kg/m  Physical Exam Vitals and nursing note reviewed.  Constitutional:      General: He is not in acute distress.    Appearance: He is well-developed.  HENT:     Head: Normocephalic and atraumatic.  Eyes:     Extraocular Movements: Extraocular movements intact.  Cardiovascular:     Rate and Rhythm: Normal rate and regular rhythm.     Pulses:          Radial pulses are 2+ on the right side and 2+ on the left side.       Dorsalis pedis pulses  are 2+ on the right side and 2+ on the left side.     Heart sounds: Normal heart sounds.  Pulmonary:     Effort: Pulmonary effort is normal.     Breath sounds: Normal breath sounds.  Abdominal:     Palpations: Abdomen is soft.     Tenderness: There is no abdominal tenderness.  Musculoskeletal:        General: Normal range of motion.     Cervical back: Normal range of motion and neck supple.     Right lower leg: No edema.     Left lower leg: No edema.  Skin:    General: Skin is warm and dry.  Neurological:     General: No focal deficit present.     Mental Status: He is alert and oriented to person, place, and time.  Psychiatric:        Mood and Affect: Mood normal.        Behavior: Behavior normal.     ED Results / Procedures / Treatments   Labs (all labs ordered are listed, but only abnormal results are displayed) Labs Reviewed  CBC WITH DIFFERENTIAL/PLATELET  BASIC METABOLIC PANEL  CBG MONITORING, ED  TROPONIN I (HIGH SENSITIVITY)    EKG EKG Interpretation  Date/Time:  Wednesday April 30 2022 15:08:11 EDT Ventricular Rate:  79 PR Interval:  151 QRS Duration: 92 QT Interval:  366 QTC Calculation: 420 R Axis:   -13 Text Interpretation: Sinus rhythm Low voltage, precordial leads No significant change since last tracing Confirmed by Leanord Asal ([id number]) on 04/30/2022 3:15:52 PM  Radiology DG Chest Portable 1 View  Result Date: 04/30/2022 CLINICAL DATA:  Chest pain. EXAM: PORTABLE CHEST 1 VIEW COMPARISON:  04/17/22 CXR FINDINGS: Status post median sternotomy. No pleural effusion. No pneumothorax. Left basilar atelectasis. Unchanged cardiac and mediastinal contours. No displaced rib fractures. Visualized upper abdomen in unremarkable. IMPRESSION: No radiographic finding to explain chest pain. Electronically Signed   By: Marin Roberts M.D.   On: 04/30/2022 15:55    Procedures Procedures    Medications Ordered in ED Medications - No data to display  ED Course/  Medical Decision Making/ A&P Clinical Course as of 04/30/22 1640  Wed Apr 30, 2022  1639 Patient signed out to Dr. Sherry Ruffing pending labs and reassessment with plan for likely admission. [VK]    Clinical Course User Index [VK] Kemper Durie, DO                           Medical Decision Making This  patient presents to the ED with chief complaint(s) of chest pain with pertinent past medical history of CAD status post recent CABG and stent placement which further complicates the presenting complaint. The complaint involves an extensive differential diagnosis and also carries with it a high risk of complications and morbidity.    The differential diagnosis includes ACS, arrhythmia, pneumothorax, pneumonia, patient has no radiation of his pain and no neurologic deficits with equal pulses in all 4 extremities making dissection unlikely, gastritis, GERD, food bolus less likely as this started an hour after he last ate  Additional history obtained: Additional history obtained from spouse Records reviewed cardio thoracic records -recent hospitalization included STEMI with cardiac arrest requiring cardioversion  ED Course and Reassessment: Patient is well-appearing and hemodynamically stable on arrival.  EKG was performed which showed no obvious ischemic changes compared to his prior EKG.  He will have labs including troponin and chest x-ray performed and will be closely monitored.  Independent labs interpretation:  The following labs were independently interpreted: Pending  Independent visualization of imaging: - I independently visualized the following imaging with scope of interpretation limited to determining acute life threatening conditions related to emergency care: Chest x-ray, which revealed no acute disease     Amount and/or Complexity of Data Reviewed Labs: ordered. Radiology: ordered.           Final Clinical Impression(s) / ED Diagnoses Final diagnoses:  None     Rx / DC Orders ED Discharge Orders     None         Kemper Durie, DO 04/30/22 1640

## 2022-04-30 NOTE — H&P (Signed)
Cardiology Admission History and Physical   Patient ID: Wayne Vang MRN: [id number]; DOB: 1961-02-25   Admission date: 04/30/2022  PCP:  Bolotin Loffler, MD   [facility] Cardiologist:  Donato Heinz, MD  Cardiology APP:  Ledora Bottcher, [place]       Chief Complaint:  chest pain  Patient Profile:   Wayne Vang is a 61 y.o. male with history of CAD s/p CABG in early September, early graft anastomosis stenosis with Vfib arrest  who is being seen 04/30/2022 for the evaluation of chest pain.  History of Present Illness:   Wayne Vang with a hx of CAD s/p CABG, HLD, seizure disorder, GERD, and hiatal hernia. He was hospitalized 03/19/22 with chest pain and elevated troponin. Heart catheterization revealed left main equivalent disease with proximal LAD and ostial LCX disease. He was recommended for CABG revascularization. Echocardiogram with preserved EF 60-65%, mild LVH, grade 1 DD, and no significant valvular disease. He underwent CABG x 3 with LIMA-LAD and SVG-RI-IM1 on 03/26/22. Post op course was unremarkable. Unfortunately, he presented back to [facility] 04/17/22 for sudden onset severe CP. CODE STEMI activated in the field. While in the ER, he suffered a Vfib arrest with ROSC after 1 shock. He was taken to the cath lab emergently for heart cath. HS troponin trended to 2080. LHC showed patent grafts but 99% occlusion of the graft insertion site into the OM with thrombus  treated with PTCA. Balloon only due to significant size match between the SVG and OM. Antiplatelet therapy felt complicated by his antiseizure medication phenytoin.  It was felt brilinta's effectiveness would be diminished by phenytoin. Plavix was switched to effient.  Patient did well until 11:30 today. Developed mid sternal chest pain without radiation. Some pressure. Initially pain low level but after 30 minutes intensified to 4/10. He took 1 sl Ntg and called EMS. Pain has since almost  completely resolved.    Past Medical History:  Diagnosis Date   Cancer ([place])    "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"   Esophageal reflux    Hiatal hernia    Low back pain    Other and unspecified hyperlipidemia    Seizure disorder ([place])    "over 10 years ago"   Tremor     Past Surgical History:  Procedure Laterality Date   COLONOSCOPY     CORONARY ARTERY BYPASS GRAFT N/A 03/26/2022   Procedure: CORONARY ARTERY BYPASS GRAFTING (CABG) X THREE, USING LEFT INTERNAL MAMMARY ARTERY AND RIGHT LEG GREATER SAPHENOUS VEIN;  Surgeon: [name], MD;  Location: [place];  Service: Open Heart Surgery;  Laterality: N/A;   CORONARY BALLOON ANGIOPLASTY N/A 04/17/2022   Procedure: CORONARY BALLOON ANGIOPLASTY;  Surgeon: Jettie Booze, MD;  Location: [place] [facility];  Service: Cardiovascular;  Laterality: N/A;   INGUINAL HERNIA REPAIR Right 12/06/2013   Procedure:  REPAIR RIGHT INGUINAL HERNIA;  Surgeon: Joyice Faster. Cornett, MD;  Location: [place];  Service: General;  Laterality: Right;   INGUINAL HERNIA REPAIR Left 07/21/2013   INSERTION OF MESH N/A 12/06/2013   Procedure: INSERTION OF MESH;  Surgeon: Joyice Faster. Cornett, MD;  Location: [place];  Service: General;  Laterality: N/A;   LEFT HEART CATH AND CORONARY ANGIOGRAPHY N/A 03/20/2022   Procedure: LEFT HEART CATH AND CORONARY ANGIOGRAPHY;  Surgeon: Jettie Booze, MD;  Location: [place] [facility];  Service: Cardiovascular;  Laterality: N/A;   LEFT HEART CATH AND CORONARY ANGIOGRAPHY N/A  04/17/2022   Procedure: LEFT HEART CATH AND CORONARY ANGIOGRAPHY;  Surgeon: Jettie Booze, MD;  Location: [place] [facility];  Service: Cardiovascular;  Laterality: N/A;   LEFT HEART CATH AND CORS/GRAFTS ANGIOGRAPHY N/A 04/17/2022   Procedure: LEFT HEART CATH AND CORS/GRAFTS ANGIOGRAPHY;  Surgeon: Jettie Booze, MD;  Location: [place] [facility];  Service: Cardiovascular;  Laterality: N/A;   POLYPECTOMY     TEE WITHOUT  CARDIOVERSION N/A 03/26/2022   Procedure: TRANSESOPHAGEAL ECHOCARDIOGRAM (TEE);  Surgeon: [name], MD;  Location: [place];  Service: Open Heart Surgery;  Laterality: N/A;     Medications Prior to Admission: Prior to Admission medications   Medication Sig Start Date End Date Taking? Authorizing Provider  acetaminophen (TYLENOL) 500 MG tablet Take 1,000 mg by mouth daily as needed for mild pain or headache.   Yes [provider]  aspirin EC 81 MG tablet Take 1 tablet (81 mg total) by mouth daily. Swallow whole. 04/21/22  Yes Duke, Tami Lin, PA  Cholecalciferol (VITAMIN D-3 PO) Take 1 tablet by mouth daily.   Yes [provider]  lansoprazole (PREVACID) 30 MG capsule TAKE 1 CAPSULE DAILY BEFORE BREAKFAST Patient taking differently: Take 30 mg by mouth daily. 04/22/22  Yes Copland, Frederico Hamman, MD  metoprolol succinate (TOPROL-XL) 25 MG 24 hr tablet Take 1 tablet (25 mg total) by mouth daily. 04/21/22  Yes Duke, Tami Lin, PA  nitroGLYCERIN (NITROSTAT) 0.4 MG SL tablet Place 1 tablet (0.4 mg total) under the tongue every 5 (five) minutes as needed for chest pain. 04/19/22 04/19/23 Yes Sande Rives E, PA-C  phenytoin (DILANTIN) 100 MG ER capsule Take 2-3 capsules (200-300 mg total) by mouth See admin instructions. Take 2 capsules (200 mg) every morning and then take 3 capsules (300 mg) at bedtime Patient taking differently: Take 200-300 mg by mouth See admin instructions. 200 mg every morning, 300 mg at beditme 03/30/22  Yes Gold, Wayne E, PA-C  prasugrel (EFFIENT) 10 MG TABS tablet Take 1 tablet (10 mg total) by mouth daily. 04/21/22  Yes Duke, Tami Lin, PA  rosuvastatin (CRESTOR) 40 MG tablet Take 1 tablet (40 mg total) by mouth daily. 04/21/22  Yes Duke, Tami Lin, PA     Allergies:    Allergies  Allergen Reactions   Nexium [Esomeprazole] Other (See Comments)    Abdominal pain   Zocor [Simvastatin] Other (See Comments)    Ineffective     Gaviscon [Alum  Hydroxide-Mag Carbonate] Other (See Comments)    Medication interaction only    Social History:   Social History   Socioeconomic History   Marital status: Married    Spouse name: Not on file   Number of children: 1   Years of education: Not on file   Highest education level: Not on file  Occupational History   Occupation: Shop Printmaker: [employer]    Comment: [employer]  Tobacco Use   Smoking status: Former    Types: Cigarettes    Quit date: 07/21/2002    Years since quitting: 19.7   Smokeless tobacco: Never  Vaping Use   Vaping Use: Never used  Substance and Sexual Activity   Alcohol use: Yes    Comment: socially   Drug use: No   Sexual activity: Not on file  Other Topics Concern   Not on file  Social History Narrative      He has been married 31+ years.   Caffeine Use: He drinks coke,  tea, and coffee.   Truck Dealer   Social Determinants of Health   Financial Resource Strain: Not on file  Food Insecurity: No Food Insecurity (03/29/2022)   Hunger Vital Sign    Worried About Running Out of Food in the Last Year: Never true    [place] in the Last Year: Never true  Transportation Needs: No Transportation Needs (03/29/2022)   PRAPARE - Hydrologist (Medical): No    Lack of Transportation (Non-Medical): No  Physical Activity: Not on file  Stress: Not on file  Social Connections: Not on file  Intimate Partner Violence: Not At Risk (03/29/2022)   Humiliation, Afraid, Rape, and Kick questionnaire    Fear of Current or Ex-Partner: No    Emotionally Abused: No    Physically Abused: No    Sexually Abused: No    Family History:   The patient's family history includes Alcohol abuse in his brother; Alzheimer's disease in his mother; Cancer in his father; Colon polyps in his sister; Stroke in his sister. There is no history of Colon cancer, Esophageal cancer, Stomach cancer, or Rectal cancer.    ROS:   Please see the history of present illness.  All other ROS reviewed and negative.     Physical Exam/Data:   Vitals:   04/30/22 1645 04/30/22 1700 04/30/22 1715 04/30/22 1815  BP: 123/83 112/81 107/79 120/84  Pulse: 78 82 83 80  Resp: '11 10 10 16  '$ Temp:      TempSrc:      SpO2: 99% 99% 99% 99%  Weight:      Height:       No intake or output data in the 24 hours ending 04/30/22 1838    04/30/2022    3:10 PM 04/28/2022    1:27 PM 04/21/2022   11:08 AM  Last 3 Weights  Weight (lbs) 218 lb 218 lb 3.2 oz 216 lb 3.2 oz  Weight (kg) 98.884 kg 98.975 kg 98.068 kg     Body mass index is 27.99 kg/m.  General:  Well nourished, well developed, in no acute distress HEENT: normal Neck: no JVD Vascular: No carotid bruits; Distal pulses 2+ bilaterally   Cardiac:  normal S1, S2; RRR; no murmur  Lungs:  clear to auscultation bilaterally, no wheezing, rhonchi or rales  Abd: soft, nontender, no hepatomegaly  Ext: no edema Musculoskeletal:  No deformities, BUE and BLE strength normal and equal Skin: warm and dry  Neuro:  CNs 2-12 intact, no focal abnormalities noted Psych:  Normal affect    EKG:  The ECG that was done today  was personally reviewed and demonstrates NSR, low voltage, no acute ST-T changes.   Relevant CV Studies: Echocardiogram 04/17/2022: Impressions: 1. Limited Echo. Tachycardic making the EF/wall motion assessment  challenging. Mild hypokinesis of the anteroseptal/anterior wall from the  base to the apex. Left ventricular ejection fraction, by estimation, is 45  to 50%. The left ventricle has mildly  decreased function.   2. Right ventricular systolic function is normal. The right ventricular  size is not well visualized.   3. A small pericardial effusion is present. There is no evidence of  cardiac tamponade.   4. No evidence of mitral valve regurgitation.   5. The aortic valve is tricuspid. Aortic valve regurgitation is not  visualized.   6. The inferior vena  cava is normal in size with <50% respiratory  variability, suggesting right atrial pressure of 8 mmHg.  _______________  Left Cardiac Catheterization 04/17/2022:   Prox LAD lesion is 90% stenosed.  LIMA to LAD is widely patent.   Ost Cx to Prox Cx lesion is 75% stenosed.  The SVG to ramus portion of the jump graft is widely patent.   1st Mrg-1 lesion is 25% stenosed.   1st Mrg-2 lesion is 99% stenosed at the insertion site of the SVG.   Balloon angioplasty was performed using a BALLN SAPPHIRE 2.0X12.   Post intervention, there is a 0% residual stenosis.   LV end diastolic pressure is mildly elevated.   There is no aortic valve stenosis.   Successful PTCA of the subtotal occlusion of the SVG to OM insertion site.  TIMI-3 flow restored.  Patient's symptoms resolved.   Antiplatelet therapy is complicated due to his phenytoin use.  After discussion with pharmacy, will plan to change clopidogrel over to Effient.  Brilinta was initially given for rapid antiplatelet therapy but given chronic phenytoin use, the Brilinta effect will be diminished.  We will load Effient 60 mg this evening.  Start daily Effient tomorrow.   Diagnostic Dominance: Right  Intervention       Laboratory Data:  High Sensitivity Troponin:   Recent Labs  Lab 04/17/22 1238 04/17/22 2143 04/30/22 1531  TROPONINIHS 19* 2,080* 6      Chemistry Recent Labs  Lab 04/30/22 1531  NA 136  K 4.3  CL 101  CO2 26  GLUCOSE 93  BUN 14  CREATININE 0.93  CALCIUM 9.6  GFRNONAA >60  ANIONGAP 9    No results for input(s): "PROT", "ALBUMIN", "AST", "ALT", "ALKPHOS", "BILITOT" in the last 168 hours. Lipids No results for input(s): "CHOL", "TRIG", "HDL", "LABVLDL", "LDLCALC", "CHOLHDL" in the last 168 hours. Hematology Recent Labs  Lab 04/30/22 1531  WBC 9.4  RBC 4.63  HGB 14.4  HCT 41.6  MCV 89.8  MCH 31.1  MCHC 34.6  RDW 12.4  PLT 206   Thyroid No results for input(s): "TSH", "FREET4" in the last 168  hours. BNPNo results for input(s): "BNP", "PROBNP" in the last 168 hours.  DDimer No results for input(s): "DDIMER" in the last 168 hours.   Radiology/Studies:  DG Chest Portable 1 View  Result Date: 04/30/2022 CLINICAL DATA:  Chest pain. EXAM: PORTABLE CHEST 1 VIEW COMPARISON:  04/17/22 CXR FINDINGS: Status post median sternotomy. No pleural effusion. No pneumothorax. Left basilar atelectasis. Unchanged cardiac and mediastinal contours. No displaced rib fractures. Visualized upper abdomen in unremarkable. IMPRESSION: No radiographic finding to explain chest pain. Electronically Signed   By: Marin Roberts M.D.   On: 04/30/2022 15:55     Assessment and Plan:   Chest pain. Patient with prior CABG on 03/26/22 by Dr Lavonna Monarch with LIMA to LAD and sequential SVG to ramus and OM. 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 S/p POBA of anastomosis. Now with recurrent chest pain. Fortunately Ecg and troponins are negative. Treatment options for anastomotic lesion are limited. Will increase antianginal therapy with addition of nitrates. IV heparin for now until enzymes cycled. If he has ongoing chest pain despite increase in therapy will need to consider relook cardiac cath with potential stenting of the native OM. Continue ASA and Effient.  Chronic systolic CHF. EF 45-50%. With anterior HK> consider repeat Echo in 3 months. Continue Toprol. Medication titration has been limited by low BP. Will see how well he tolerates nitrates before adding therapy HLD. On high dose Crestor 40 mg  Seizure disorder on  chronic phenytoin.    Risk Assessment/Risk Scores:    TIMI Risk Score for Unstable Angina or Non-ST Elevation MI:   The patient's TIMI risk score is 2, which indicates a 8% risk of all cause mortality, new or recurrent myocardial infarction or need for urgent revascularization in the next 14 days.       Severity of Illness: The appropriate patient status for  this patient is OBSERVATION. Observation status is judged to be reasonable and necessary in order to provide the required intensity of service to ensure the patient's safety. The patient's presenting symptoms, physical exam findings, and initial radiographic and laboratory data in the context of their medical condition is felt to place them at decreased risk for further clinical deterioration. Furthermore, it is anticipated that the patient will be medically stable for discharge from the hospital within 2 midnights of admission.    For questions or updates, please contact [place] Please consult [email or web address] for contact info under     Signed, Quante Pettry Martinique, MD  04/30/2022 6:38 PM

## 2022-04-30 NOTE — ED Provider Notes (Signed)
Care assumed from Dr. Maylon Peppers.  At time of transfer of care, patient is waiting on some basic labs prior to cardiology consultation for this concerning sounding chest pain today.  According to patient, he had pain that feels just like his recent MI.  Of note he had a recent CABG a month ago and then subsequently had a STEMI and needed PCI stenting and had cardiac arrest.  Initial troponin is normal, chest x-ray did not show a concerning finding to cause discomfort, and his EKG was reportedly reassuring today.  Now that initial troponin has returned, will call cardiology further recommendations.  Spoke to cardiology and they requested medicine admission and they will see in consultation.  They do not feel needs go to Cath Lab now but agree with trending troponins and reassessment given his recent MI with the same type of pain described.   Medicine will admit for further management.   Clinical Impression: 1. Chest pain, unspecified type   2. Nonspecific chest pain     Disposition: Admit  This note was prepared with assistance of Dragon voice recognition software. Occasional wrong-word or sound-a-like substitutions may have occurred due to the inherent limitations of voice recognition software.       Timber Marshman, Gwenyth Allegra, MD 04/30/22 2105

## 2022-04-30 NOTE — Progress Notes (Deleted)
    Wayne Severino T. [name], MD, [place] at [facility] [place], [address]  Phone: [phone]  FAX: [place] - 61 y.o. male  MRN [id number]  Date of Birth: 11-Aug-1960  Date: 04/30/2022  PCP: Cromwell Loffler, MD  Referral: Scarantino Loffler, MD  No chief complaint on file.  Subjective:   Wayne Vang is a 61 y.o. very pleasant male patient with There is no height or weight on file to calculate BMI. who presents with the following:  He presents for 2 complex cardiac hospitalizations, NSTEMI and STEMI.  Admit date: 03/19/2022 Discharge date: 03/31/2022  He presented with some chest discomfort that had been worsening over the last 1 to 2 months.  He was found to have an NSTEMI in the emergency room, cardiology was consulted, and he had a cardiac catheterization that showed severe LAD and circumflex disease.  Cardiothoracic surgery was consulted and coronary artery bypass grafting was recommended and completed by cardiothoracic surgery.  On March 26, 2022, he went to the emergency room and had coronary artery bypass grafting x3.  He was started on Plavix, beta-blocker.  He did have some postoperative thrombocytopenia, but this resolved.  He is also on a statin.  Subsequent to this, he had an additional admission.  Admit date: 04/17/2022 Discharge date: 04/19/2022  He was subsequently admitted with an acute MI of the inferior wall, STEMI.  He presented with sudden onset of chest pain, and in the ER he actually had a V-fib arrest with return after 1 shock.  He was taken to the Cath Lab for emergent catheterization.  Subsequently, he had a angioplasty due to 99% stenosis of the mid OM 2 at the insertion site of the SVG.  He was changed from Plavix to Effient.  He will also continue aspirin.  He is also on a beta-blocker and high level statin again.  Crestor 40 mg.  He continues to be on phenytoin for his  seizure disorder.  He is here today to follow-up with me about all of these events.     Review of Systems is noted in the HPI, as appropriate  Objective:   There were no vitals taken for this visit.  GEN: No acute distress; alert,appropriate. PULM: Breathing comfortably in no respiratory distress PSYCH: Normally interactive.   Laboratory and Imaging Data:  Assessment and Plan:   ***

## 2022-04-30 NOTE — Hospital Course (Signed)
61 yo  CABG one month ago. V fib arrest.   PCI a week and half ago.   Similar angina this time.   Trend troponins. Pain resolved ASA and nitroglycerin.  Concerning chest pain. Cards will see in consult.

## 2022-04-30 NOTE — Progress Notes (Signed)
ANTICOAGULATION CONSULT NOTE - Initial Consult  Pharmacy Consult for Heparin Indication: chest pain/ACS  Allergies  Allergen Reactions   Nexium [Esomeprazole] Other (See Comments)    Abdominal pain   Zocor [Simvastatin] Other (See Comments)    Ineffective     Gaviscon [Alum Hydroxide-Mag Carbonate] Other (See Comments)    Medication interaction only    Patient Measurements: Height: '6\' 2"'$  (188 cm) Weight: 98.9 kg (218 lb) IBW/kg (Calculated) : 82.2 Heparin Dosing Weight: 98.9  Vital Signs: Temp: 98.2 F (36.8 C) (10/11 1509) Temp Source: Oral (10/11 1509) BP: 120/84 (10/11 1815) Pulse Rate: 80 (10/11 1815)  Labs: Recent Labs    04/30/22 1531 04/30/22 1739  HGB 14.4  --   HCT 41.6  --   PLT 206  --   CREATININE 0.93  --   TROPONINIHS 6 8    Estimated Creatinine Clearance: 104.9 mL/min (by C-G formula based on SCr of 0.93 mg/dL).   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 ECG and troponin negative. Pharmacy has been consulted for heparin dosing for ACS.  Goal of Therapy:  Heparin level 0.3-0.7 units/ml Monitor platelets by anticoagulation protocol: Yes   Plan:  Give 4000 units bolus x 1 Start heparin infusion at 1250- units/hr Check anti-Xa level in 6 hours and daily while on heparin Continue to monitor H&H and platelets  Titus Dubin, PharmD PGY1 Pharmacy Resident 04/30/2022 7:09 PM

## 2022-05-01 ENCOUNTER — Inpatient Hospital Stay (HOSPITAL_COMMUNITY)
Admission: EM | Disposition: A | Discharge status: Home / Self Care | Payer: Self-pay | Source: Home / Self Care | Attending: Cardiology

## 2022-05-01 DIAGNOSIS — Z79899 Other long term (current) drug therapy: Secondary | ICD-10-CM | POA: Diagnosis not present

## 2022-05-01 DIAGNOSIS — Z955 Presence of coronary angioplasty implant and graft: Secondary | ICD-10-CM | POA: Diagnosis not present

## 2022-05-01 DIAGNOSIS — I252 Old myocardial infarction: Secondary | ICD-10-CM | POA: Diagnosis not present

## 2022-05-01 DIAGNOSIS — I25110 Atherosclerotic heart disease of native coronary artery with unstable angina pectoris: Secondary | ICD-10-CM | POA: Diagnosis present

## 2022-05-01 DIAGNOSIS — G40909 Epilepsy, unspecified, not intractable, without status epilepticus: Secondary | ICD-10-CM | POA: Diagnosis present

## 2022-05-01 DIAGNOSIS — Z83719 Family history of colon polyps, unspecified: Secondary | ICD-10-CM | POA: Diagnosis not present

## 2022-05-01 DIAGNOSIS — Z888 Allergy status to other drugs, medicaments and biological substances status: Secondary | ICD-10-CM | POA: Diagnosis not present

## 2022-05-01 DIAGNOSIS — Z809 Family history of malignant neoplasm, unspecified: Secondary | ICD-10-CM | POA: Diagnosis not present

## 2022-05-01 DIAGNOSIS — I5022 Chronic systolic (congestive) heart failure: Secondary | ICD-10-CM | POA: Diagnosis present

## 2022-05-01 DIAGNOSIS — E782 Mixed hyperlipidemia: Secondary | ICD-10-CM | POA: Diagnosis not present

## 2022-05-01 DIAGNOSIS — I200 Unstable angina: Secondary | ICD-10-CM | POA: Diagnosis not present

## 2022-05-01 DIAGNOSIS — Z820 Family history of epilepsy and other diseases of the nervous system: Secondary | ICD-10-CM | POA: Diagnosis not present

## 2022-05-01 DIAGNOSIS — Z811 Family history of alcohol abuse and dependence: Secondary | ICD-10-CM | POA: Diagnosis not present

## 2022-05-01 DIAGNOSIS — Z886 Allergy status to analgesic agent status: Secondary | ICD-10-CM | POA: Diagnosis not present

## 2022-05-01 DIAGNOSIS — K219 Gastro-esophageal reflux disease without esophagitis: Secondary | ICD-10-CM | POA: Diagnosis present

## 2022-05-01 DIAGNOSIS — R079 Chest pain, unspecified: Secondary | ICD-10-CM | POA: Diagnosis present

## 2022-05-01 DIAGNOSIS — Z87891 Personal history of nicotine dependence: Secondary | ICD-10-CM | POA: Diagnosis not present

## 2022-05-01 DIAGNOSIS — I25700 Atherosclerosis of coronary artery bypass graft(s), unspecified, with unstable angina pectoris: Secondary | ICD-10-CM | POA: Diagnosis present

## 2022-05-01 DIAGNOSIS — Z823 Family history of stroke: Secondary | ICD-10-CM | POA: Diagnosis not present

## 2022-05-01 DIAGNOSIS — E785 Hyperlipidemia, unspecified: Secondary | ICD-10-CM | POA: Diagnosis present

## 2022-05-01 DIAGNOSIS — Z7982 Long term (current) use of aspirin: Secondary | ICD-10-CM | POA: Diagnosis not present

## 2022-05-01 HISTORY — PX: LEFT HEART CATH AND CORS/GRAFTS ANGIOGRAPHY: CATH118250

## 2022-05-01 LAB — TROPONIN I (HIGH SENSITIVITY): Troponin I (High Sensitivity): 12 ng/L (ref <18)

## 2022-05-01 LAB — CBC
HCT: 40.5 % (ref 39.0–52.0)
Hemoglobin: 14.9 g/dL (ref 13.0–17.0)
MCH: 32.1 pg (ref 26.0–34.0)
MCHC: 36.8 g/dL — ABNORMAL HIGH (ref 30.0–36.0)
MCV: 87.3 fL (ref 80.0–100.0)
Platelets: 188 10*3/uL (ref 150–400)
RBC: 4.64 MIL/uL (ref 4.22–5.81)
RDW: 12.4 % (ref 11.5–15.5)
WBC: 5.6 10*3/uL (ref 4.0–10.5)
nRBC: 0 % (ref 0.0–0.2)

## 2022-05-01 LAB — HEPARIN LEVEL (UNFRACTIONATED): Heparin Unfractionated: 0.46 IU/mL (ref 0.30–0.70)

## 2022-05-01 SURGERY — LEFT HEART CATH AND CORS/GRAFTS ANGIOGRAPHY
Anesthesia: LOCAL

## 2022-05-01 MED ORDER — HEPARIN (PORCINE) IN NACL 1000-0.9 UT/500ML-% IV SOLN
INTRAVENOUS | Status: DC | PRN
  Administered 2022-05-01 (×2): 500 mL

## 2022-05-01 MED ORDER — HEPARIN (PORCINE) IN NACL 1000-0.9 UT/500ML-% IV SOLN
INTRAVENOUS | Status: AC
  Filled 2022-05-01: qty 1000

## 2022-05-01 MED ORDER — LIDOCAINE HCL (PF) 1 % IJ SOLN
INTRAMUSCULAR | Status: DC | PRN
  Administered 2022-05-01: 15 mL

## 2022-05-01 MED ORDER — SODIUM CHLORIDE 0.9 % IV SOLN
INTRAVENOUS | Status: AC | PRN
  Administered 2022-05-01: 250 mL via INTRAVENOUS

## 2022-05-01 MED ORDER — FENTANYL CITRATE PF 50 MCG/ML IJ SOSY
25.0000 ug | PREFILLED_SYRINGE | INTRAMUSCULAR | Status: DC | PRN
  Administered 2022-05-01: 25 ug via INTRAVENOUS
  Filled 2022-05-01: qty 1

## 2022-05-01 MED ORDER — ONDANSETRON HCL 4 MG/2ML IJ SOLN: 4.0000 mg | Freq: Four times a day (QID) | INTRAMUSCULAR | Status: DC | PRN

## 2022-05-01 MED ORDER — IOHEXOL 350 MG/ML SOLN
INTRAVENOUS | Status: DC | PRN
  Administered 2022-05-01: 60 mL via INTRA_ARTERIAL

## 2022-05-01 MED ORDER — HEPARIN SODIUM (PORCINE) 1000 UNIT/ML IJ SOLN
INTRAMUSCULAR | Status: AC
  Filled 2022-05-01: qty 10

## 2022-05-01 MED ORDER — SODIUM CHLORIDE 0.9 % IV SOLN: INTRAVENOUS | Status: DC

## 2022-05-01 MED ORDER — SODIUM CHLORIDE 0.9 % IV SOLN: INTRAVENOUS | Status: AC

## 2022-05-01 MED ORDER — MIDAZOLAM HCL 2 MG/2ML IJ SOLN
INTRAMUSCULAR | Status: DC | PRN
  Administered 2022-05-01: 1 mg via INTRAVENOUS

## 2022-05-01 MED ORDER — SODIUM CHLORIDE 0.9 % IV SOLN: 250.0000 mL | INTRAVENOUS | Status: DC | PRN

## 2022-05-01 MED ORDER — SODIUM CHLORIDE 0.9% FLUSH
3.0000 mL | Freq: Two times a day (BID) | INTRAVENOUS | Status: DC
  Administered 2022-05-01 – 2022-05-02 (×2): 3 mL via INTRAVENOUS

## 2022-05-01 MED ORDER — LABETALOL HCL 5 MG/ML IV SOLN: 10.0000 mg | INTRAVENOUS | Status: AC | PRN

## 2022-05-01 MED ORDER — HYDRALAZINE HCL 20 MG/ML IJ SOLN: 10.0000 mg | INTRAMUSCULAR | Status: AC | PRN

## 2022-05-01 MED ORDER — ASPIRIN 81 MG PO CHEW: 81.0000 mg | CHEWABLE_TABLET | ORAL | Status: DC

## 2022-05-01 MED ORDER — FENTANYL CITRATE (PF) 100 MCG/2ML IJ SOLN
INTRAMUSCULAR | Status: DC | PRN
  Administered 2022-05-01 (×2): 25 ug via INTRAVENOUS

## 2022-05-01 MED ORDER — LIDOCAINE HCL (PF) 1 % IJ SOLN
INTRAMUSCULAR | Status: AC
  Filled 2022-05-01: qty 30

## 2022-05-01 MED ORDER — MIDAZOLAM HCL 2 MG/2ML IJ SOLN
INTRAMUSCULAR | Status: AC
  Filled 2022-05-01: qty 2

## 2022-05-01 MED ORDER — ACETAMINOPHEN 325 MG PO TABS
650.0000 mg | ORAL_TABLET | ORAL | Status: DC | PRN
  Administered 2022-05-01: 650 mg via ORAL
  Filled 2022-05-01: qty 2

## 2022-05-01 MED ORDER — FENTANYL CITRATE (PF) 100 MCG/2ML IJ SOLN
INTRAMUSCULAR | Status: AC
  Filled 2022-05-01: qty 2

## 2022-05-01 MED ORDER — SODIUM CHLORIDE 0.9% FLUSH
3.0000 mL | Freq: Two times a day (BID) | INTRAVENOUS | Status: DC
  Administered 2022-05-02: 3 mL via INTRAVENOUS

## 2022-05-01 MED ORDER — SODIUM CHLORIDE 0.9% FLUSH: 3.0000 mL | INTRAVENOUS | Status: DC | PRN

## 2022-05-01 MED ORDER — VERAPAMIL HCL 2.5 MG/ML IV SOLN
INTRAVENOUS | Status: AC
  Filled 2022-05-01: qty 2

## 2022-05-01 SURGICAL SUPPLY — 13 items
CATH INFINITI 5FR MULTPACK ANG (CATHETERS) IMPLANT
CLOSURE MYNX CONTROL 5F (Vascular Products) IMPLANT
ELECT DEFIB PAD ADLT CADENCE (PAD) IMPLANT
GLIDESHEATH SLEND SS 6F .021 (SHEATH) IMPLANT
GUIDEWIRE INQWIRE 1.5J.035X260 (WIRE) IMPLANT
INQWIRE 1.5J .035X260CM (WIRE) ×1
KIT HEART LEFT (KITS) ×1 IMPLANT
PACK CARDIAC CATHETERIZATION (CUSTOM PROCEDURE TRAY) ×1 IMPLANT
SHEATH PINNACLE 5F 10CM (SHEATH) IMPLANT
SHEATH PROBE COVER 6X72 (BAG) IMPLANT
TRANSDUCER W/STOPCOCK (MISCELLANEOUS) ×1 IMPLANT
TUBING CIL FLEX 10 FLL-RA (TUBING) ×1 IMPLANT
WIRE EMERALD 3MM-J .035X150CM (WIRE) IMPLANT

## 2022-05-01 NOTE — ED Notes (Signed)
Patient provided urinal at bedside.

## 2022-05-01 NOTE — Progress Notes (Signed)
ANTICOAGULATION CONSULT NOTE - Follow Up Consult  Pharmacy Consult for heparin Indication: chest pain/ACS  Patient Measurements: Height: '6\' 2"'$  (188 cm) Weight: 98.9 kg (218 lb) IBW/kg (Calculated) : 82.2 Heparin Dosing Weight: 99  Vital Signs: Temp: 98.4 F (36.9 C) (10/11 1947) Temp Source: Oral (10/11 1947) BP: 124/84 (10/12 0300) Pulse Rate: 90 (10/12 0300)  Labs: Recent Labs    04/30/22 1531 04/30/22 1739 04/30/22 1929 05/01/22 0252  HGB 14.4  --   --  14.9  HCT 41.6  --   --  40.5  PLT 206  --   --  188  HEPARINUNFRC  --   --   --  0.46  CREATININE 0.93  --   --   --   TROPONINIHS '6 8 15 12    '$ Estimated Creatinine Clearance: 104.9 mL/min (by C-G formula based on SCr of 0.93 mg/dL).  Assessment: Heparin level therapeutic at 0.46.   Goal of Therapy:  Heparin level 0.3-0.7 units/ml Monitor platelets by anticoagulation protocol: Yes   Plan:  Continue heparin infusion at 1250 units/hr  Lerry Liner, PharmD Candidate class of 2024 05/01/2022,3:49 AM

## 2022-05-01 NOTE — ED Notes (Signed)
Unopened medication found by MD at bedside and brought to nurses station, medication marked not given by previous RN. Medication returned to pharmacy.

## 2022-05-01 NOTE — ED Notes (Signed)
Informed consent completed with patient by this RN. Signed consent at bedside.

## 2022-05-01 NOTE — Progress Notes (Signed)
Patient to 3E13 from ED. Vitals signs obtained. Alert and oriented. Call bell within reach.

## 2022-05-01 NOTE — ED Notes (Signed)
ED TO INPATIENT HANDOFF REPORT  ED Nurse Name and Phone #: Andee Poles [place] Name/Age/Gender Wayne Vang 61 y.o. male Room/Bed: 004C/004C  Code Status   Code Status: Full Code  Home/SNF/Other Home Patient oriented to: self, place, time, and situation Is this baseline? Yes   Triage Complete: Triage complete  Chief Complaint Unstable angina ([place]) [I20.0] Chest pain [R07.9]  Triage Note Patient states chest pain started around 1230. Patient had recent heart surgery and an nstemi a few weeks ago. Patient states it feels similar. Patient is alert and oriented x4 patient took a nitro and 4 baby asa   Allergies Allergies  Allergen Reactions   Nexium [Esomeprazole] Other (See Comments)    Abdominal pain   Zocor [Simvastatin] Other (See Comments)    Ineffective     Gaviscon [Alum Hydroxide-Mag Carbonate] Other (See Comments)    Medication interaction only    Level of Care/Admitting Diagnosis ED Disposition     ED Disposition  Admit   Condition  --   [place]: [place] [[id number]]  Level of Care: Telemetry Cardiac [103]  May admit patient to [facility] or [facility] if equivalent level of care is available:: No  Covid Evaluation: Asymptomatic - no recent exposure (last 10 days) testing not required  Diagnosis: Unstable angina [facility]) [[id number]]  Admitting Physician: [place], PETER M [[id number]]  Attending Physician: [place], PETER M [[id number]]  Certification:: I certify this patient will need inpatient services for at least 2 midnights  Estimated Length of Stay: 2          B Medical/Surgery History Past Medical History:  Diagnosis Date   Cancer ([place])    "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"   Esophageal reflux    Hiatal hernia    Low back pain    Other and unspecified hyperlipidemia    Seizure disorder ([place])    "over 10 years ago"   Tremor    Past Surgical History:  Procedure Laterality Date   COLONOSCOPY     CORONARY ARTERY BYPASS GRAFT N/A  03/26/2022   Procedure: CORONARY ARTERY BYPASS GRAFTING (CABG) X THREE, USING LEFT INTERNAL MAMMARY ARTERY AND RIGHT LEG GREATER SAPHENOUS VEIN;  Surgeon: [name], MD;  Location: [place];  Service: Open Heart Surgery;  Laterality: N/A;   CORONARY BALLOON ANGIOPLASTY N/A 04/17/2022   Procedure: CORONARY BALLOON ANGIOPLASTY;  Surgeon: Jettie Booze, MD;  Location: [place] [facility];  Service: Cardiovascular;  Laterality: N/A;   INGUINAL HERNIA REPAIR Right 12/06/2013   Procedure:  REPAIR RIGHT INGUINAL HERNIA;  Surgeon: Joyice Faster. Cornett, MD;  Location: [place];  Service: General;  Laterality: Right;   INGUINAL HERNIA REPAIR Left 07/21/2013   INSERTION OF MESH N/A 12/06/2013   Procedure: INSERTION OF MESH;  Surgeon: Joyice Faster. Cornett, MD;  Location: [place];  Service: General;  Laterality: N/A;   LEFT HEART CATH AND CORONARY ANGIOGRAPHY N/A 03/20/2022   Procedure: LEFT HEART CATH AND CORONARY ANGIOGRAPHY;  Surgeon: Jettie Booze, MD;  Location: [place] [facility];  Service: Cardiovascular;  Laterality: N/A;   LEFT HEART CATH AND CORONARY ANGIOGRAPHY N/A 04/17/2022   Procedure: LEFT HEART CATH AND CORONARY ANGIOGRAPHY;  Surgeon: Jettie Booze, MD;  Location: [place] [facility];  Service: Cardiovascular;  Laterality: N/A;   LEFT HEART CATH AND CORS/GRAFTS ANGIOGRAPHY N/A 04/17/2022   Procedure: LEFT HEART CATH AND CORS/GRAFTS ANGIOGRAPHY;  Surgeon: Jettie Booze, MD;  Location: [place]  [facility];  Service: Cardiovascular;  Laterality: N/A;   POLYPECTOMY     TEE WITHOUT CARDIOVERSION N/A 03/26/2022   Procedure: TRANSESOPHAGEAL ECHOCARDIOGRAM (TEE);  Surgeon: [name], MD;  Location: [place];  Service: Open Heart Surgery;  Laterality: N/A;     A IV Location/Drains/Wounds Patient Lines/Drains/Airways Status     Active Line/Drains/Airways     Name Placement date Placement time Site Days   Peripheral IV 04/30/22 20 G Left Antecubital  04/30/22  1512  Antecubital  1   Peripheral IV 04/30/22 20 G Right Forearm 04/30/22  1527  Forearm  1   Incision (Closed) 03/26/22 Leg Right 03/26/22  1058  -- 36   Incision (Closed) 03/26/22 Chest Other (Comment) 03/26/22  1058  -- 36            Intake/Output Last 24 hours No intake or output data in the 24 hours ending 05/01/22 1151  Labs/Imaging Results for orders placed or performed during the hospital encounter of 04/30/22 (from the past 48 hour(s))  Basic metabolic panel     Status: None   Collection Time: 04/30/22  3:31 PM  Result Value Ref Range   Sodium 136 135 - 145 mmol/L   Potassium 4.3 3.5 - 5.1 mmol/L   Chloride 101 98 - 111 mmol/L   CO2 26 22 - 32 mmol/L   Glucose, Bld 93 70 - 99 mg/dL    Comment: Glucose reference range applies only to samples taken after fasting for at least 8 hours.   BUN 14 8 - 23 mg/dL   Creatinine, Ser 0.93 0.61 - 1.24 mg/dL   Calcium 9.6 8.9 - 10.3 mg/dL   GFR, Estimated >60 >60 mL/min    Comment: (NOTE) Calculated using the CKD-EPI Creatinine Equation (2021)    Anion gap 9 5 - 15    Comment: Performed at [facility] [address] [place], [place] [address]  Troponin I (High Sensitivity)     Status: None   Collection Time: 04/30/22  3:31 PM  Result Value Ref Range   Troponin I (High Sensitivity) 6 <18 ng/L    Comment: (NOTE) Elevated high sensitivity troponin I (hsTnI) values and significant  changes across serial measurements may suggest ACS but many other  chronic and acute conditions are known to elevate hsTnI results.  Refer to the "Links" section for chest pain algorithms and additional  guidance. Performed at [facility], [place] [address] [place], [place] [address]   CBC with Differential/Platelet     Status: None   Collection Time: 04/30/22  3:31 PM  Result Value Ref Range   WBC 9.4 4.0 - 10.5 K/uL   RBC 4.63 4.22 - 5.81 MIL/uL   Hemoglobin 14.4 13.0 - 17.0 g/dL   HCT 41.6 39.0 - 52.0 %   MCV 89.8 80.0  - 100.0 fL   MCH 31.1 26.0 - 34.0 pg   MCHC 34.6 30.0 - 36.0 g/dL   RDW 12.4 11.5 - 15.5 %   Platelets 206 150 - 400 K/uL   nRBC 0.0 0.0 - 0.2 %   Neutrophils Relative % 71 %   Neutro Abs 6.8 1.7 - 7.7 K/uL   Lymphocytes Relative 19 %   Lymphs Abs 1.8 0.7 - 4.0 K/uL   Monocytes Relative 6 %   Monocytes Absolute 0.5 0.1 - 1.0 K/uL   Eosinophils Relative 3 %   Eosinophils Absolute 0.2 0.0 - 0.5 K/uL   Basophils Relative 1 %   Basophils  Absolute 0.1 0.0 - 0.1 K/uL   Immature Granulocytes 0 %   Abs Immature Granulocytes 0.02 0.00 - 0.07 K/uL    Comment: Performed at [facility], [place] [address], [place], [place] [address]  CBG monitoring, ED     Status: None   Collection Time: 04/30/22  5:27 PM  Result Value Ref Range   Glucose-Capillary 82 70 - 99 mg/dL    Comment: Glucose reference range applies only to samples taken after fasting for at least 8 hours.  Troponin I (High Sensitivity)     Status: None   Collection Time: 04/30/22  5:39 PM  Result Value Ref Range   Troponin I (High Sensitivity) 8 <18 ng/L    Comment: (NOTE) Elevated high sensitivity troponin I (hsTnI) values and significant  changes across serial measurements may suggest ACS but many other  chronic and acute conditions are known to elevate hsTnI results.  Refer to the "Links" section for chest pain algorithms and additional  guidance. Performed at [facility], [place] [address], [place], [place] [address]   Troponin I (High Sensitivity)     Status: None   Collection Time: 04/30/22  7:29 PM  Result Value Ref Range   Troponin I (High Sensitivity) 15 <18 ng/L    Comment: (NOTE) Elevated high sensitivity troponin I (hsTnI) values and significant  changes across serial measurements may suggest ACS but many other  chronic and acute conditions are known to elevate hsTnI results.  Refer to the "Links" section for chest pain algorithms and additional  guidance. Performed at [facility], [place]  [address], [place], [place] [address]   Heparin level (unfractionated)     Status: None   Collection Time: 05/01/22  2:52 AM  Result Value Ref Range   Heparin Unfractionated 0.46 0.30 - 0.70 IU/mL    Comment: (NOTE) The clinical reportable range upper limit is being lowered to >1.10 to align with the FDA approved guidance for the current laboratory assay.  If heparin results are below expected values, and patient dosage has  been confirmed, suggest follow up testing of antithrombin III levels. Performed at [facility], [place] [address] [place] [address]   CBC     Status: Abnormal   Collection Time: 05/01/22  2:52 AM  Result Value Ref Range   WBC 5.6 4.0 - 10.5 K/uL   RBC 4.64 4.22 - 5.81 MIL/uL   Hemoglobin 14.9 13.0 - 17.0 g/dL   HCT 40.5 39.0 - 52.0 %   MCV 87.3 80.0 - 100.0 fL   MCH 32.1 26.0 - 34.0 pg   MCHC 36.8 (H) 30.0 - 36.0 g/dL   RDW 12.4 11.5 - 15.5 %   Platelets 188 150 - 400 K/uL   nRBC 0.0 0.0 - 0.2 %    Comment: Performed at [facility], [place] [address], [place], [place] [address]  Troponin I (High Sensitivity)     Status: None   Collection Time: 05/01/22  2:52 AM  Result Value Ref Range   Troponin I (High Sensitivity) 12 <18 ng/L    Comment: (NOTE) Elevated high sensitivity troponin I (hsTnI) values and significant  changes across serial measurements may suggest ACS but many other  chronic and acute conditions are known to elevate hsTnI results.  Refer to the "Links" section for chest pain algorithms and additional  guidance. Performed at [facility], [place] [address], [place], [place] [address]    DG  Chest Portable 1 View  Result Date: 04/30/2022 CLINICAL DATA:  Chest pain. EXAM: PORTABLE CHEST 1 VIEW COMPARISON:  04/17/22 CXR FINDINGS: Status post median sternotomy. No pleural effusion. No pneumothorax. Left basilar atelectasis. Unchanged cardiac and mediastinal contours. No displaced rib fractures. Visualized upper abdomen in  unremarkable. IMPRESSION: No radiographic finding to explain chest pain. Electronically Signed   By: Marin Roberts M.D.   On: 04/30/2022 15:55    Pending Labs Unresulted Labs (From admission, onward)     Start     Ordered   05/02/22 0500  Heparin level (unfractionated)  Daily at 5am,   R     See Hyperspace for full Linked Orders Report.   04/30/22 1912   05/01/22 0500  CBC  Daily at 5am,   R     See Hyperspace for full Linked Orders Report.   04/30/22 1912            Vitals/Pain Today's Vitals   05/01/22 0732 05/01/22 0900 05/01/22 0910 05/01/22 0911  BP:  121/79 (!) 118/96 (!) 118/96  Pulse:  88 94 96  Resp:  10 (!) 24   Temp:   98.3 F (36.8 C)   TempSrc:   Oral   SpO2:  99% 97%   Weight:      Height:      PainSc: 0-No pain       Isolation Precautions No active isolations  Medications Medications  aspirin EC tablet 81 mg (81 mg Oral Given 05/01/22 0912)  metoprolol succinate (TOPROL-XL) 24 hr tablet 25 mg (25 mg Oral Given 05/01/22 0911)  rosuvastatin (CRESTOR) tablet 40 mg (40 mg Oral Given 05/01/22 0911)  pantoprazole (PROTONIX) EC tablet 40 mg (40 mg Oral Given 05/01/22 0912)  prasugrel (EFFIENT) tablet 10 mg (10 mg Oral Given 05/01/22 0958)  nitroGLYCERIN (NITROSTAT) SL tablet 0.4 mg (has no administration in time range)  acetaminophen (TYLENOL) tablet 650 mg (has no administration in time range)  ondansetron (ZOFRAN) injection 4 mg (has no administration in time range)  isosorbide mononitrate (IMDUR) 24 hr tablet 30 mg (30 mg Oral Given 05/01/22 0912)  phenytoin (DILANTIN) ER capsule 200 mg (200 mg Oral Given 05/01/22 0733)    And  phenytoin (DILANTIN) ER capsule 300 mg (300 mg Oral Given 04/30/22 2146)  heparin ADULT infusion 100 units/mL (25000 units/266m) (1,250 Units/hr Intravenous Rate/Dose Verify 05/01/22 0731)  sodium chloride flush (NS) 0.9 % injection 3 mL (3 mLs Intravenous Not Given 05/01/22 0959)  aspirin chewable tablet 81 mg (has no  administration in time range)  0.9 %  sodium chloride infusion (has no administration in time range)  fentaNYL (SUBLIMAZE) injection 25 mcg (has no administration in time range)  heparin bolus via infusion 4,000 Units (4,000 Units Intravenous Bolus from Bag 04/30/22 1931)    Mobility walks     Focused Assessments    R Recommendations: See Admitting Provider Note  Report given to:   Additional Notes:

## 2022-05-01 NOTE — H&P (View-Only) (Signed)
Rounding Note    Patient Name: Wayne Vang Date of Encounter: 05/01/2022  [place] Cardiologist: Donato Heinz, MD   Subjective   Patient has continue low grade chest pressure through the night. Complains of a lot of back pain from lying on stretcher.   Inpatient Medications    Scheduled Meds:  aspirin EC  81 mg Oral Daily   isosorbide mononitrate  30 mg Oral Daily   metoprolol succinate  25 mg Oral Daily   pantoprazole  40 mg Oral Daily   phenytoin  200 mg Oral q AM   And   phenytoin  300 mg Oral QHS   prasugrel  10 mg Oral Daily   rosuvastatin  40 mg Oral Daily   Continuous Infusions:  heparin 1,250 Units/hr (05/01/22 0731)   PRN Meds: acetaminophen, nitroGLYCERIN, ondansetron (ZOFRAN) IV   Vital Signs    Vitals:   05/01/22 0300 05/01/22 0400 05/01/22 0500 05/01/22 0700  BP: 124/84 106/77 102/83 105/89  Pulse: 90 80 78 83  Resp: (!) '21 18 16 13  '$ Temp:   98.4 F (36.9 C)   TempSrc:      SpO2: 95% 96% 97% 98%  Weight:      Height:       No intake or output data in the 24 hours ending 05/01/22 0844    04/30/2022    3:10 PM 04/28/2022    1:27 PM 04/21/2022   11:08 AM  Last 3 Weights  Weight (lbs) 218 lb 218 lb 3.2 oz 216 lb 3.2 oz  Weight (kg) 98.884 kg 98.975 kg 98.068 kg      Telemetry    NSR - Personally Reviewed  ECG    NSR normal - Personally Reviewed  Physical Exam   GEN: No acute distress.   Neck: No JVD Cardiac: RRR, no murmurs, rubs, or gallops.  Respiratory: Clear to auscultation bilaterally. GI: Soft, nontender, non-distended  MS: No edema; No deformity. Neuro:  Nonfocal  Psych: Normal affect   Labs    High Sensitivity Troponin:   Recent Labs  Lab 04/17/22 2143 04/30/22 1531 04/30/22 1739 04/30/22 1929 05/01/22 0252  TROPONINIHS 2,080* '6 8 15 12     '$ Chemistry Recent Labs  Lab 04/30/22 1531  NA 136  K 4.3  CL 101  CO2 26  GLUCOSE 93  BUN 14  CREATININE 0.93  CALCIUM 9.6  GFRNONAA  >60  ANIONGAP 9    Lipids No results for input(s): "CHOL", "TRIG", "HDL", "LABVLDL", "LDLCALC", "CHOLHDL" in the last 168 hours.  Hematology Recent Labs  Lab 04/30/22 1531 05/01/22 0252  WBC 9.4 5.6  RBC 4.63 4.64  HGB 14.4 14.9  HCT 41.6 40.5  MCV 89.8 87.3  MCH 31.1 32.1  MCHC 34.6 36.8*  RDW 12.4 12.4  PLT 206 188   Thyroid No results for input(s): "TSH", "FREET4" in the last 168 hours.  BNPNo results for input(s): "BNP", "PROBNP" in the last 168 hours.  DDimer No results for input(s): "DDIMER" in the last 168 hours.   Radiology    DG Chest Portable 1 View  Result Date: 04/30/2022 CLINICAL DATA:  Chest pain. EXAM: PORTABLE CHEST 1 VIEW COMPARISON:  04/17/22 CXR FINDINGS: Status post median sternotomy. No pleural effusion. No pneumothorax. Left basilar atelectasis. Unchanged cardiac and mediastinal contours. No displaced rib fractures. Visualized upper abdomen in unremarkable. IMPRESSION: No radiographic finding to explain chest pain. Electronically Signed   By: Marin Roberts M.D.   On: 04/30/2022 15:55  Cardiac Studies   Echocardiogram 04/17/2022: Impressions: 1. Limited Echo. Tachycardic making the EF/wall motion assessment  challenging. Mild hypokinesis of the anteroseptal/anterior wall from the  base to the apex. Left ventricular ejection fraction, by estimation, is 45  to 50%. The left ventricle has mildly  decreased function.   2. Right ventricular systolic function is normal. The right ventricular  size is not well visualized.   3. A small pericardial effusion is present. There is no evidence of  cardiac tamponade.   4. No evidence of mitral valve regurgitation.   5. The aortic valve is tricuspid. Aortic valve regurgitation is not  visualized.   6. The inferior vena cava is normal in size with <50% respiratory  variability, suggesting right atrial pressure of 8 mmHg.  _______________   Left Cardiac Catheterization 04/17/2022:   Prox LAD lesion is 90%  stenosed.  LIMA to LAD is widely patent.   Ost Cx to Prox Cx lesion is 75% stenosed.  The SVG to ramus portion of the jump graft is widely patent.   1st Mrg-1 lesion is 25% stenosed.   1st Mrg-2 lesion is 99% stenosed at the insertion site of the SVG.   Balloon angioplasty was performed using a BALLN SAPPHIRE 2.0X12.   Post intervention, there is a 0% residual stenosis.   LV end diastolic pressure is mildly elevated.   There is no aortic valve stenosis.   Successful PTCA of the subtotal occlusion of the SVG to OM insertion site.  TIMI-3 flow restored.  Patient's symptoms resolved.   Antiplatelet therapy is complicated due to his phenytoin use.  After discussion with pharmacy, will plan to change clopidogrel over to Effient.  Brilinta was initially given for rapid antiplatelet therapy but given chronic phenytoin use, the Brilinta effect will be diminished.  We will load Effient 60 mg this evening.  Start daily Effient tomorrow.   Diagnostic Dominance: Right  Intervention       Patient Profile     61 y.o. male  with history of CAD s/p CABG in early September, early graft anastomosis stenosis with Vfib arrest  who is being seen 04/30/2022 for the evaluation of chest pain.  Assessment & Plan    Chest pain. Patient with prior CABG on 03/26/22 by Dr Lavonna Monarch with LIMA to LAD and sequential SVG to ramus and OM. 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 S/p POBA of anastomosis. Now with recurrent chest pain. Fortunately Ecg and troponins are negative. Treatment options for anastomotic lesion are limited. Will increase antianginal therapy with addition of nitrates. IV heparin.  He has ongoing chest pressure so I think relook cardiac cath is indicated with potential stenting of the native LCx if there is continued problems with area of graft insertion. Continue ASA and Effient. The procedure and risks were reviewed including but not limited to death,  myocardial infarction, stroke, arrythmias, bleeding, transfusion, emergency surgery, dye allergy, or renal dysfunction. The patient voices understanding and is agreeable to proceed. Chronic systolic CHF. EF 45-50%. With anterior HK> consider repeat Echo in 3 months. Continue Toprol. Medication titration has been limited by low BP. Will see how well he tolerates nitrates before adding therapy HLD. On high dose Crestor 40 mg  Seizure disorder on chronic phenytoin.     For questions or updates, please contact [place] Please consult [email or web address] for contact info under        Signed, Secily Walthour [place], MD  05/01/2022, 8:44 AM

## 2022-05-01 NOTE — Progress Notes (Signed)
Rounding Note    Patient Name: Wayne Vang Date of Encounter: 05/01/2022  [place] Cardiologist: Donato Heinz, MD   Subjective   Patient has continue low grade chest pressure through the night. Complains of a lot of back pain from lying on stretcher.   Inpatient Medications    Scheduled Meds:  aspirin EC  81 mg Oral Daily   isosorbide mononitrate  30 mg Oral Daily   metoprolol succinate  25 mg Oral Daily   pantoprazole  40 mg Oral Daily   phenytoin  200 mg Oral q AM   And   phenytoin  300 mg Oral QHS   prasugrel  10 mg Oral Daily   rosuvastatin  40 mg Oral Daily   Continuous Infusions:  heparin 1,250 Units/hr (05/01/22 0731)   PRN Meds: acetaminophen, nitroGLYCERIN, ondansetron (ZOFRAN) IV   Vital Signs    Vitals:   05/01/22 0300 05/01/22 0400 05/01/22 0500 05/01/22 0700  BP: 124/84 106/77 102/83 105/89  Pulse: 90 80 78 83  Resp: (!) '21 18 16 13  '$ Temp:   98.4 F (36.9 C)   TempSrc:      SpO2: 95% 96% 97% 98%  Weight:      Height:       No intake or output data in the 24 hours ending 05/01/22 0844    04/30/2022    3:10 PM 04/28/2022    1:27 PM 04/21/2022   11:08 AM  Last 3 Weights  Weight (lbs) 218 lb 218 lb 3.2 oz 216 lb 3.2 oz  Weight (kg) 98.884 kg 98.975 kg 98.068 kg      Telemetry    NSR - Personally Reviewed  ECG    NSR normal - Personally Reviewed  Physical Exam   GEN: No acute distress.   Neck: No JVD Cardiac: RRR, no murmurs, rubs, or gallops.  Respiratory: Clear to auscultation bilaterally. GI: Soft, nontender, non-distended  MS: No edema; No deformity. Neuro:  Nonfocal  Psych: Normal affect   Labs    High Sensitivity Troponin:   Recent Labs  Lab 04/17/22 2143 04/30/22 1531 04/30/22 1739 04/30/22 1929 05/01/22 0252  TROPONINIHS 2,080* '6 8 15 12     '$ Chemistry Recent Labs  Lab 04/30/22 1531  NA 136  K 4.3  CL 101  CO2 26  GLUCOSE 93  BUN 14  CREATININE 0.93  CALCIUM 9.6  GFRNONAA  >60  ANIONGAP 9    Lipids No results for input(s): "CHOL", "TRIG", "HDL", "LABVLDL", "LDLCALC", "CHOLHDL" in the last 168 hours.  Hematology Recent Labs  Lab 04/30/22 1531 05/01/22 0252  WBC 9.4 5.6  RBC 4.63 4.64  HGB 14.4 14.9  HCT 41.6 40.5  MCV 89.8 87.3  MCH 31.1 32.1  MCHC 34.6 36.8*  RDW 12.4 12.4  PLT 206 188   Thyroid No results for input(s): "TSH", "FREET4" in the last 168 hours.  BNPNo results for input(s): "BNP", "PROBNP" in the last 168 hours.  DDimer No results for input(s): "DDIMER" in the last 168 hours.   Radiology    DG Chest Portable 1 View  Result Date: 04/30/2022 CLINICAL DATA:  Chest pain. EXAM: PORTABLE CHEST 1 VIEW COMPARISON:  04/17/22 CXR FINDINGS: Status post median sternotomy. No pleural effusion. No pneumothorax. Left basilar atelectasis. Unchanged cardiac and mediastinal contours. No displaced rib fractures. Visualized upper abdomen in unremarkable. IMPRESSION: No radiographic finding to explain chest pain. Electronically Signed   By: Marin Roberts M.D.   On: 04/30/2022 15:55  Cardiac Studies   Echocardiogram 04/17/2022: Impressions: 1. Limited Echo. Tachycardic making the EF/wall motion assessment  challenging. Mild hypokinesis of the anteroseptal/anterior wall from the  base to the apex. Left ventricular ejection fraction, by estimation, is 45  to 50%. The left ventricle has mildly  decreased function.   2. Right ventricular systolic function is normal. The right ventricular  size is not well visualized.   3. A small pericardial effusion is present. There is no evidence of  cardiac tamponade.   4. No evidence of mitral valve regurgitation.   5. The aortic valve is tricuspid. Aortic valve regurgitation is not  visualized.   6. The inferior vena cava is normal in size with <50% respiratory  variability, suggesting right atrial pressure of 8 mmHg.  _______________   Left Cardiac Catheterization 04/17/2022:   Prox LAD lesion is 90%  stenosed.  LIMA to LAD is widely patent.   Ost Cx to Prox Cx lesion is 75% stenosed.  The SVG to ramus portion of the jump graft is widely patent.   1st Mrg-1 lesion is 25% stenosed.   1st Mrg-2 lesion is 99% stenosed at the insertion site of the SVG.   Balloon angioplasty was performed using a BALLN SAPPHIRE 2.0X12.   Post intervention, there is a 0% residual stenosis.   LV end diastolic pressure is mildly elevated.   There is no aortic valve stenosis.   Successful PTCA of the subtotal occlusion of the SVG to OM insertion site.  TIMI-3 flow restored.  Patient's symptoms resolved.   Antiplatelet therapy is complicated due to his phenytoin use.  After discussion with pharmacy, will plan to change clopidogrel over to Effient.  Brilinta was initially given for rapid antiplatelet therapy but given chronic phenytoin use, the Brilinta effect will be diminished.  We will load Effient 60 mg this evening.  Start daily Effient tomorrow.   Diagnostic Dominance: Right  Intervention       Patient Profile     61 y.o. male  with history of CAD s/p CABG in early September, early graft anastomosis stenosis with Vfib arrest  who is being seen 04/30/2022 for the evaluation of chest pain.  Assessment & Plan    Chest pain. Patient with prior CABG on 03/26/22 by Dr Lavonna Monarch with LIMA to LAD and sequential SVG to ramus and OM. 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 S/p POBA of anastomosis. Now with recurrent chest pain. Fortunately Ecg and troponins are negative. Treatment options for anastomotic lesion are limited. Will increase antianginal therapy with addition of nitrates. IV heparin.  He has ongoing chest pressure so I think relook cardiac cath is indicated with potential stenting of the native LCx if there is continued problems with area of graft insertion. Continue ASA and Effient. The procedure and risks were reviewed including but not limited to death,  myocardial infarction, stroke, arrythmias, bleeding, transfusion, emergency surgery, dye allergy, or renal dysfunction. The patient voices understanding and is agreeable to proceed. Chronic systolic CHF. EF 45-50%. With anterior HK> consider repeat Echo in 3 months. Continue Toprol. Medication titration has been limited by low BP. Will see how well he tolerates nitrates before adding therapy HLD. On high dose Crestor 40 mg  Seizure disorder on chronic phenytoin.     For questions or updates, please contact [place] Please consult [email or web address] for contact info under        Signed, Jenelle Drennon [place], MD  05/01/2022, 8:44 AM

## 2022-05-01 NOTE — Interval H&P Note (Signed)
Cath Lab Visit (complete for each Cath Lab visit)  Clinical Evaluation Leading to the Procedure:   ACS: Yes.    Non-ACS:    Anginal Classification: CCS IV  Anti-ischemic medical therapy: Minimal Therapy (1 class of medications)  Non-Invasive Test Results: No non-invasive testing performed  Prior CABG: Previous CABG   Mild chest discomfort, similar to 2 weeks ago but not as severe.    History and Physical Interval Note:  05/01/2022 2:52 PM  Wayne Vang  has presented today for surgery, with the diagnosis of unstable angina.  The various methods of treatment have been discussed with the patient and family. After consideration of risks, benefits and other options for treatment, the patient has consented to  Procedure(s): LEFT HEART CATH AND CORS/GRAFTS ANGIOGRAPHY (N/A) as a surgical intervention.  The patient's history has been reviewed, patient examined, no change in status, stable for surgery.  I have reviewed the patient's chart and labs.  Questions were answered to the patient's satisfaction.     [name]

## 2022-05-02 ENCOUNTER — Encounter (HOSPITAL_COMMUNITY): Payer: Self-pay | Admitting: Interventional Cardiology

## 2022-05-02 ENCOUNTER — Other Ambulatory Visit (HOSPITAL_COMMUNITY): Payer: Self-pay

## 2022-05-02 DIAGNOSIS — R079 Chest pain, unspecified: Secondary | ICD-10-CM

## 2022-05-02 DIAGNOSIS — E782 Mixed hyperlipidemia: Secondary | ICD-10-CM

## 2022-05-02 MED ORDER — ISOSORBIDE MONONITRATE ER 30 MG PO TB24
30.0000 mg | ORAL_TABLET | Freq: Every day | ORAL | 1 refills | Status: DC
  Filled 2022-05-02: qty 30, 30d supply, fill #0

## 2022-05-02 NOTE — Progress Notes (Signed)
   05/02/22 1000  Mobility  Activity Ambulated independently in hallway  Level of Assistance Independent  Assistive Device None  Distance Ambulated (ft) 250 ft  Activity Response Tolerated well  Mobility Referral Yes  $Mobility charge 1 Mobility   Mobility Specialist Progress Note  Received pt in bed having no complaints and agreeable to mobility.Had c/o of clavicle pain during ambulation. Returned to bed w/ all needs met and call bell in reach   Lucious Groves Mobility Specialist

## 2022-05-02 NOTE — Discharge Summary (Signed)
Discharge Summary    Patient ID: Wayne Vang MRN: [id number]; DOB: 1961/03/04  Admit date: 04/30/2022 Discharge date: 05/02/2022  PCP:  Wayne Vang Providers Cardiologist:  Wayne Heinz, MD  Cardiology APP:  Wayne Vang, [place]     Discharge Diagnoses    Principal Problem:   Unstable angina [facility]) Active Problems:   Hyperlipidemia   Chest pain  Diagnostic Studies/Procedures    Cath: 05/01/22    Prox LAD lesion is 90% stenosed.  LIMA to LAD is patent.   Ost Cx to Prox Cx lesion is 75% stenosed.  SVG to ramus and OM is patent.  Distal part of SVG has decreased in size to better match the OM2 vessel.   1st Mrg-1 lesion is 25% stenosed.   Non-stenotic 1st Mrg-2 lesion was previously treated.  Insertion site is widely patent.   The left ventricular systolic function is normal.   LV end diastolic pressure is normal.   The left ventricular ejection fraction is 55-65% by visual estimate.   There is no aortic valve stenosis.   Continue medical therapy.  Flow to all territories appears normal.   Distal part of SVG has decreased in size and better matches the OM2 vessel.  Diagnostic Dominance: Right  _____________   History of Present Illness     Wayne Vang is a 61 y.o. male with history of CAD s/p CABG in early September, early graft anastomosis stenosis with Vfib arrest  who was seen 04/30/2022 for the evaluation of chest pain.  Wayne Vang with a hx of CAD s/p CABG, HLD, seizure disorder, GERD, and hiatal hernia. He was hospitalized 03/19/22 with chest pain and elevated troponin. Heart catheterization revealed left main equivalent disease with proximal LAD and ostial LCX disease. He was recommended for CABG revascularization. Echocardiogram with preserved EF 60-65%, mild LVH, grade 1 DD, and no significant valvular disease. He underwent CABG x 3 with LIMA-LAD and SVG-RI-IM1 on 03/26/22. Post op course was unremarkable.  Unfortunately, he presented back to [facility] 04/17/22 for sudden onset severe CP. CODE STEMI activated in the field. While in the ER, he suffered a Vfib arrest with ROSC after 1 shock. He was taken to the cath lab emergently for heart cath. HS troponin trended to 2080. LHC showed patent grafts but 99% occlusion of the graft insertion site into the OM with thrombus  treated with PTCA. Balloon only due to significant size match between the SVG and OM. Antiplatelet therapy felt complicated by his antiseizure medication phenytoin.  It was felt brilinta's effectiveness would be diminished by phenytoin. Plavix was switched to effient.   Patient did well until 11:30 today. Developed mid sternal chest pain without radiation. Some pressure. Initially pain low level but after 30 minutes intensified to 4/10. He took 1 sl Ntg and called EMS. Pain has since almost completely resolved.     Hospital Course     Chest pain -- Patient with prior CABG on 03/26/22 by Dr Wayne Vang with LIMA to LAD and sequential SVG to ramus and OM. 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 S/p POBA of anastomosis.  -- presented with recurrent chest pain. EKG was nonischemic and trops negative. Had ongoing chest pain and underwent cardiac cath noted above with patent SVG-OM, (distal part of SVG noted to better match vessel), along with patent LIMA-LAD.  -- Continue ASA and Effient statin, Toprol. Imdur '30mg'$  daily added post cath  Chronic systolic CHF ICM -- EF 95-09%. With anterior HK> consider repeat Echo in 3 months. Medication titration has been limited by low BP.  -- continue Toprol, Imdur '30mg'$  daily  HLD -- continue high dose Crestor 40 mg   Seizure disorder  -- on chronic phenytoin  General: Well developed, well nourished, male appearing in no acute distress. Head: Normocephalic, atraumatic.  Neck: Supple without bruits, JVD. Lungs:  Resp regular and unlabored, CTA. Heart: RRR, S1,  S2, no S3, S4, or murmur; no rub. Abdomen: Soft, non-tender, non-distended with normoactive bowel sounds. No hepatomegaly. No rebound/guarding. No obvious abdominal masses. Extremities: No clubbing, cyanosis, edema. Distal pedal pulses are 2+ bilaterally. Right femoral cath site stable mild bruising, no hematoma Neuro: Alert and oriented X 3. Moves all extremities spontaneously. Psych: Normal affect.   Did the patient have an acute coronary syndrome (MI, NSTEMI, STEMI, etc) this admission?:  No                               Did the patient have a percutaneous coronary intervention (stent / angioplasty)?:  No.   _____________  Discharge Vitals Blood pressure (!) 113/92, pulse 99, temperature 98.1 F (36.7 C), temperature source Oral, resp. rate 20, height '6\' 2"'$  (1.88 m), weight 95.8 kg, SpO2 95 %.  Filed Weights   04/30/22 1510 05/02/22 0300  Weight: 98.9 kg 95.8 kg    Labs & Radiologic Studies    CBC Recent Labs    04/30/22 1531 05/01/22 0252  WBC 9.4 5.6  NEUTROABS 6.8  --   HGB 14.4 14.9  HCT 41.6 40.5  MCV 89.8 87.3  PLT 206 326   Basic Metabolic Panel Recent Labs    04/30/22 1531  NA 136  K 4.3  CL 101  CO2 26  GLUCOSE 93  BUN 14  CREATININE 0.93  CALCIUM 9.6   Liver Function Tests No results for input(s): "AST", "ALT", "ALKPHOS", "BILITOT", "PROT", "ALBUMIN" in the last 72 hours. No results for input(s): "LIPASE", "AMYLASE" in the last 72 hours. High Sensitivity Troponin:   Recent Labs  Lab 04/17/22 2143 04/30/22 1531 04/30/22 1739 04/30/22 1929 05/01/22 0252  TROPONINIHS 2,080* '6 8 15 12    '$ BNP Invalid input(s): "POCBNP" D-Dimer No results for input(s): "DDIMER" in the last 72 hours. Hemoglobin A1C No results for input(s): "HGBA1C" in the last 72 hours. Fasting Lipid Panel No results for input(s): "CHOL", "HDL", "LDLCALC", "TRIG", "CHOLHDL", "LDLDIRECT" in the last 72 hours. Thyroid Function Tests No results for input(s): "TSH", "T4TOTAL",  "T3FREE", "THYROIDAB" in the last 72 hours.  Invalid input(s): "FREET3" _____________  CARDIAC CATHETERIZATION  Result Date: 05/01/2022   Prox LAD lesion is 90% stenosed.  LIMA to LAD is patent.   Ost Cx to Prox Cx lesion is 75% stenosed.  SVG to ramus and OM is patent.  Distal part of SVG has decreased in size to better match the OM2 vessel.   1st Mrg-1 lesion is 25% stenosed.   Non-stenotic 1st Mrg-2 lesion was previously treated.  Insertion site is widely patent.   The left ventricular systolic function is normal.   LV end diastolic pressure is normal.   The left ventricular ejection fraction is 55-65% by visual estimate.   There is no aortic valve stenosis. Continue medical therapy.  Flow to all territories appears normal.   Distal part of SVG has decreased in size and better matches the OM2 vessel.  DG Chest Portable 1 View  Result Date: 04/30/2022 CLINICAL DATA:  Chest pain. EXAM: PORTABLE CHEST 1 VIEW COMPARISON:  04/17/22 CXR FINDINGS: Status post median sternotomy. No pleural effusion. No pneumothorax. Left basilar atelectasis. Unchanged cardiac and mediastinal contours. No displaced rib fractures. Visualized upper abdomen in unremarkable. IMPRESSION: No radiographic finding to explain chest pain. Electronically Signed   By: Marin Jorey Dollard M.D.   On: 04/30/2022 15:55   DG Chest Port 1 View  Result Date: 04/17/2022 CLINICAL DATA:  Chest pain EXAM: PORTABLE CHEST 1 VIEW COMPARISON:  04/09/2022 FINDINGS: Prior CABG. Heart and mediastinal contours are within normal limits. No focal opacities or effusions. No acute bony abnormality. IMPRESSION: No active disease. Electronically Signed   By: Rolm Baptise M.D.   On: 04/17/2022 22:16   CARDIAC CATHETERIZATION  Result Date: 04/17/2022   Prox LAD lesion is 90% stenosed.  LIMA to LAD is widely patent.   Ost Cx to Prox Cx lesion is 75% stenosed.  The SVG to ramus portion of the jump graft is widely patent.   1st Mrg-1 lesion is 25% stenosed.   1st  Mrg-2 lesion is 99% stenosed at the insertion site of the SVG.   Balloon angioplasty was performed using a BALLN SAPPHIRE 2.0X12.   Post intervention, there is a 0% residual stenosis.   LV end diastolic pressure is mildly elevated.   There is no aortic valve stenosis. Successful PTCA of the subtotal occlusion of the SVG to OM insertion site.  TIMI-3 flow restored.  Patient's symptoms resolved. Antiplatelet therapy is complicated due to his phenytoin use.  After discussion with pharmacy, will plan to change clopidogrel over to Effient.  Brilinta was initially given for rapid antiplatelet therapy but given chronic phenytoin use, the Brilinta effect will be diminished.  We will load Effient 60 mg this evening.  Start daily Effient tomorrow.   ECHOCARDIOGRAM COMPLETE  Result Date: 04/17/2022    ECHOCARDIOGRAM REPORT   Patient Name:   Kolter Reaver Date of Exam: 04/17/2022 Medical Rec #:  [id number]     Height:       74.0 in Accession #:    [id number]    Weight:       214.1 lb Date of Birth:  Jan 23, 1961      BSA:          2.237 m Patient Age:    40 years      BP:           -/- mmHg Patient Gender: M             HR:           100 bpm. Exam Location:  Inpatient Procedure: 2D Echo, Color Doppler and Cardiac Doppler STAT ECHO Indications:    R07.9* Chest pain, unspecified  History:        Patient has prior history of Echocardiogram examinations, most                 recent 03/26/2022. Prior CABG; Risk Factors:Dyslipidemia.  Sonographer:    Raquel Sarna Senior RDCS Referring Phys: [name] MD  Sonographer Comments: STAT in ED prior to transfer to cath lab, Drs Irish Lack and [place] present at bedside IMPRESSIONS  1. Limited Echo. Tachycardic making the EF/wall motion assessment challenging. Mild hypokinesis of the anteroseptal/anterior wall from the base to the apex. Left ventricular ejection fraction, by estimation, is 45 to 50%. The left ventricle has mildly decreased function.  2. Right ventricular systolic function is  normal. The  right ventricular size is not well visualized.  3. A small pericardial effusion is present. There is no evidence of cardiac tamponade.  4. No evidence of mitral valve regurgitation.  5. The aortic valve is tricuspid. Aortic valve regurgitation is not visualized.  6. The inferior vena cava is normal in size with <50% respiratory variability, suggesting right atrial pressure of 8 mmHg. FINDINGS  Left Ventricle: Limited Echo. Tachycardic making the EF/wall motion assessment challenging. Mild hypokinesis of the anteroseptal/anterior wall from the base to the apex. Left ventricular ejection fraction, by estimation, is 45 to 50%. The left ventricle  has mildly decreased function. The left ventricular internal cavity size was normal in size. There is no left ventricular hypertrophy. Right Ventricle: The right ventricular size is not well visualized. Right ventricular systolic function is normal. Left Atrium: Left atrial size was normal in size. Right Atrium: Right atrial size was normal in size. Pericardium: A small pericardial effusion is present. There is no evidence of cardiac tamponade. Mitral Valve: No evidence of mitral valve regurgitation. Tricuspid Valve: Tricuspid valve regurgitation is not demonstrated. Aortic Valve: The aortic valve is tricuspid. Aortic valve regurgitation is not visualized. Pulmonic Valve: Pulmonic valve regurgitation is not visualized. Venous: The inferior vena cava is normal in size with less than 50% respiratory variability, suggesting right atrial pressure of 8 mmHg. IAS/Shunts: No atrial level shunt detected by color flow Doppler. Phineas Inches Electronically signed by Phineas Inches Signature Date/Time: 04/17/2022/1:06:43 PM    Final    DG Chest 2 View  Result Date: 04/09/2022 CLINICAL DATA:  Status post CABG x3. EXAM: CHEST - 2 VIEW COMPARISON:  Chest two views [id number] FINDINGS: Status post median sternotomy and CABG. Cardiac silhouette is at the upper limits of normal size.  Surgical clips overlie the left mediastinum and inferior cardiac silhouette. Mild calcification within the aortic arch. The lungs are clear. No pleural effusion or pneumothorax. Mild multilevel degenerative disc changes of the thoracic spine. IMPRESSION: Status post median sternotomy and CABG.  No acute lung process. Electronically Signed   By: Yvonne Kendall M.D.   On: 04/09/2022 14:33   Disposition   Pt is being discharged home today in good condition.  Follow-up Plans & Appointments     Follow-up Information     Lenna Sciara, NP Follow up on 05/13/2022.   Specialties: Nurse Practitioner, Family Medicine Why: at 8:30am for your follow up appt with Dr. Christain Sacramento' NP Maris Berger information: [address] [place] [address] [phone]                Discharge Instructions     Call MD for:  difficulty breathing, headache or visual disturbances   Complete by: As directed    Call MD for:  persistant dizziness or light-headedness   Complete by: As directed    Call MD for:  redness, tenderness, or signs of infection (pain, swelling, redness, odor or green/yellow discharge around incision site)   Complete by: As directed    Diet - low sodium heart healthy   Complete by: As directed    Discharge instructions   Complete by: As directed    Radial Site Care Refer to this sheet in the next few weeks. These instructions provide you with information on caring for yourself after your procedure. Your caregiver may also give you more specific instructions. Your treatment has been planned according to current medical practices, but problems sometimes occur. Call your caregiver if you have any problems or questions  after your procedure. HOME CARE INSTRUCTIONS You may shower the day after the procedure. Remove the bandage (dressing) and gently wash the site with plain soap and water. Gently pat the site dry.  Do not apply powder or lotion to the site.  Do not submerge  the affected site in water for 3 to 5 days.  Inspect the site at least twice daily.  Do not flex or bend the affected arm for 24 hours.  No lifting over 5 pounds (2.3 kg) for 5 days after your procedure.  Do not drive home if you are discharged the same day of the procedure. Have someone else drive you.  You may drive 24 hours after the procedure unless otherwise instructed by your caregiver.  What to expect: Any bruising will usually fade within 1 to 2 weeks.  Blood that collects in the tissue (hematoma) may be painful to the touch. It should usually decrease in size and tenderness within 1 to 2 weeks.  SEEK IMMEDIATE MEDICAL CARE IF: You have unusual pain at the radial site.  You have redness, warmth, swelling, or pain at the radial site.  You have drainage (other than a small amount of blood on the dressing).  You have chills.  You have a fever or persistent symptoms for more than 72 hours.  You have a fever and your symptoms suddenly get worse.  Your arm becomes pale, cool, tingly, or numb.  You have heavy bleeding from the site. Hold pressure on the site.   Increase activity slowly   Complete by: As directed         Discharge Medications   Allergies as of 05/02/2022       Reactions   Nexium [esomeprazole] Other (See Comments)   Abdominal pain   Zocor [simvastatin] Other (See Comments)   Ineffective    Gaviscon [alum Hydroxide-mag Carbonate] Other (See Comments)   Medication interaction only        Medication List     TAKE these medications    acetaminophen 500 MG tablet Commonly known as: TYLENOL Take 1,000 mg by mouth daily as needed for mild pain or headache.   aspirin EC 81 MG tablet Take 1 tablet (81 mg total) by mouth daily. Swallow whole.   isosorbide mononitrate 30 MG 24 hr tablet Commonly known as: IMDUR Take 1 tablet (30 mg total) by mouth daily. Start taking on: May 03, 2022   lansoprazole 30 MG capsule Commonly known as: PREVACID TAKE 1  CAPSULE DAILY BEFORE BREAKFAST What changed: See the new instructions.   metoprolol succinate 25 MG 24 hr tablet Commonly known as: TOPROL-XL Take 1 tablet (25 mg total) by mouth daily.   nitroGLYCERIN 0.4 MG SL tablet Commonly known as: Nitrostat Place 1 tablet (0.4 mg total) under the tongue every 5 (five) minutes as needed for chest pain.   phenytoin 100 MG ER capsule Commonly known as: DILANTIN Take 2-3 capsules (200-300 mg total) by mouth See admin instructions. Take 2 capsules (200 mg) every morning and then take 3 capsules (300 mg) at bedtime What changed: additional instructions   prasugrel 10 MG Tabs tablet Commonly known as: EFFIENT Take 1 tablet (10 mg total) by mouth daily.   rosuvastatin 40 MG tablet Commonly known as: CRESTOR Take 1 tablet (40 mg total) by mouth daily.   VITAMIN D-3 PO Take 1 tablet by mouth daily.        Outstanding Labs/Studies   N/a   Duration of Discharge Encounter  Greater than 30 minutes including physician time.  Signed, Reino Bellis, NP 05/02/2022, 10:07 AM

## 2022-05-05 ENCOUNTER — Telehealth: Payer: Self-pay

## 2022-05-05 NOTE — Telephone Encounter (Signed)
Transition Care Management Follow-up Telephone Call Date of discharge and from where: 05/02/2022 Wayne Vang  How have you been since you were released from the hospital? Patient has been doing ok just still a little sore. Denies any chest pains after d/c.  Any questions or concerns? No  Items Reviewed: Did the pt receive and understand the discharge instructions provided? Yes  Medications obtained and verified? No  Other? No  Any new allergies since your discharge? No  Dietary orders reviewed? Yes Do you have support at home? Yes   Home Care and Equipment/Supplies: Were home health services ordered? no If so, what is the name of the agency? N/A  Has the agency set up a time to come to the patient's home? no Were any new equipment or medical supplies ordered?  No What is the name of the medical supply agency? N/A Were you able to get the supplies/equipment? not applicable Do you have any questions related to the use of the equipment or supplies? No  Functional Questionnaire: (I = Independent and D = Dependent) ADLs: N/A   Bathing/Dressing- I  Meal Prep- I  Eating- I  Maintaining continence- I  Transferring/Ambulation- I  Managing Meds- I  Follow up appointments reviewed:  PCP Hospital f/u appt confirmed? Yes  Scheduled to see [facility] on 05/12/2022 @ 11:40. [facility] f/u appt confirmed? Yes  Scheduled to see Cardiology on 05/13/2022 @ 8:25. Are transportation arrangements needed? Yes  If their condition worsens, is the pt aware to call PCP or go to the Emergency Dept.? Yes Was the patient provided with contact information for the PCP's office or ED? Yes Was to pt encouraged to call back with questions or concerns? Yes

## 2022-05-07 MED FILL — Verapamil HCl IV Soln 2.5 MG/ML: INTRAVENOUS | Qty: 2 | Status: AC

## 2022-05-11 NOTE — Progress Notes (Unsigned)
    [name] [name], MD, [name] at [facility] [name], [address]  Phone: [phone]  FAX: [place] - 61 y.o. male  MRN [id number]  Date of Birth: 12-22-1960  Date: 05/12/2022  PCP: Boule Loffler, MD  Referral: Sadiq Loffler, MD  No chief complaint on file.  Subjective:   Wayne Vang is a 61 y.o. very pleasant male patient with There is no height or weight on file to calculate BMI. who presents with the following:  Complicated hospital follow-up, what is actually follow-up from 3 separate cardiac hospitalizations.   Admit date: 03/19/2022 Discharge date: 03/31/2022  He was admitted with NSTEMI and received CABG x 3 after he presented to the hospital with chest pain.  Progressive chest pain with exertion for roughly 6 months, worsening the prior 2 months.  NSTEMI with troponin peak of 52, and found to have severe LAD and circumflex disease.    "On 03/26/2022 he was taken to the operating room at which time he underwent coronary artery bypass grafting x 3 utilizing LIMA to LAD and sequential SVG to OM to Ramus.  "  Post-operatively, he stabilized and was discharged home.  Admit date: 04/17/2022 Discharge date: 04/19/2022  He was admitted with a STEMI and received a PTCA of the subtotal occlusion of the SVG to OM insertion site.  Cardiology changed him from Plavix to Effient.    He had a VF arrest in the ER, but he was resuscitated in the ER after 1 shock, and he was taken immediately to the cath lab.  Admit date: 04/30/2022 Discharge date: 05/02/2022  He was admitted with chest pain, felt to be angina, and he had a clean repeat cardiac catheterization, and troponins were negative for MI.   He will continue his ASA, effient, statin (crestor 40), metoprolol and Cardiology additionally added Imdur this hospitalization.   He is here to follow-up in our office after all of this has  transpired.   He has cardiology follow-up on 05/13/2022.         Review of Systems is noted in the HPI, as appropriate  Objective:   There were no vitals taken for this visit.  GEN: No acute distress; alert,appropriate. PULM: Breathing comfortably in no respiratory distress PSYCH: Normally interactive.   Laboratory and Imaging Data:  Assessment and Plan:   ***

## 2022-05-12 ENCOUNTER — Encounter: Payer: Self-pay | Admitting: Family Medicine

## 2022-05-12 ENCOUNTER — Ambulatory Visit (INDEPENDENT_AMBULATORY_CARE_PROVIDER_SITE_OTHER): Payer: BC Managed Care – PPO | Admitting: Family Medicine

## 2022-05-12 VITALS — BP 90/60 | HR 66 | Temp 98.2°F | Ht 74.0 in | Wt 221.5 lb

## 2022-05-12 DIAGNOSIS — R1311 Dysphagia, oral phase: Secondary | ICD-10-CM

## 2022-05-12 DIAGNOSIS — I2102 ST elevation (STEMI) myocardial infarction involving left anterior descending coronary artery: Secondary | ICD-10-CM

## 2022-05-12 DIAGNOSIS — I214 Non-ST elevation (NSTEMI) myocardial infarction: Secondary | ICD-10-CM

## 2022-05-12 DIAGNOSIS — Z951 Presence of aortocoronary bypass graft: Secondary | ICD-10-CM

## 2022-05-12 DIAGNOSIS — I5020 Unspecified systolic (congestive) heart failure: Secondary | ICD-10-CM

## 2022-05-12 DIAGNOSIS — I2119 ST elevation (STEMI) myocardial infarction involving other coronary artery of inferior wall: Secondary | ICD-10-CM

## 2022-05-12 DIAGNOSIS — I4901 Ventricular fibrillation: Secondary | ICD-10-CM

## 2022-05-12 LAB — BASIC METABOLIC PANEL
BUN: 12 mg/dL (ref 6–23)
CO2: 31 mEq/L (ref 19–32)
Calcium: 9.8 mg/dL (ref 8.4–10.5)
Chloride: 102 mEq/L (ref 96–112)
Creatinine, Ser: 0.78 mg/dL (ref 0.40–1.50)
GFR: 96.44 mL/min (ref >60.00)
Glucose, Bld: 86 mg/dL (ref 70–99)
Potassium: 4.6 mEq/L (ref 3.5–5.1)
Sodium: 140 mEq/L (ref 135–145)

## 2022-05-13 ENCOUNTER — Encounter: Payer: Self-pay | Admitting: Nurse Practitioner

## 2022-05-13 ENCOUNTER — Ambulatory Visit: Payer: PRIVATE HEALTH INSURANCE | Attending: Nurse Practitioner | Admitting: Nurse Practitioner

## 2022-05-13 VITALS — BP 90/64 | HR 64 | Ht 74.0 in | Wt 221.0 lb

## 2022-05-13 DIAGNOSIS — K219 Gastro-esophageal reflux disease without esophagitis: Secondary | ICD-10-CM

## 2022-05-13 DIAGNOSIS — I255 Ischemic cardiomyopathy: Secondary | ICD-10-CM | POA: Diagnosis not present

## 2022-05-13 DIAGNOSIS — I25118 Atherosclerotic heart disease of native coronary artery with other forms of angina pectoris: Secondary | ICD-10-CM

## 2022-05-13 DIAGNOSIS — I4901 Ventricular fibrillation: Secondary | ICD-10-CM

## 2022-05-13 DIAGNOSIS — E785 Hyperlipidemia, unspecified: Secondary | ICD-10-CM | POA: Diagnosis not present

## 2022-05-13 DIAGNOSIS — R569 Unspecified convulsions: Secondary | ICD-10-CM

## 2022-05-13 MED ORDER — ISOSORBIDE MONONITRATE ER 30 MG PO TB24: 30.0000 mg | ORAL_TABLET | Freq: Every day | ORAL | 3 refills | Status: DC

## 2022-05-13 NOTE — Patient Instructions (Addendum)
Medication Instructions:  Your physician recommends that you continue on your current medications as directed. Please refer to the Current Medication list given to you today.   *If you need a refill on your cardiac medications before your next appointment, please call your pharmacy*   Lab Work: Your physician recommends that you return for lab work in 1-2 weeks Fasting Lipid panel & LFTs  If you have labs (blood work) drawn today and your tests are completely normal, you will receive your results only by: MyChart Message (if you have MyChart) OR A paper copy in the mail If you have any lab test that is abnormal or we need to change your treatment, we will call you to review the results.   Testing/Procedures: NONE ordered at this time of appointment     Follow-Up: At [facility], you and your health needs are our priority.  As part of our continuing mission to provide you with exceptional heart care, we have created designated Provider Care Teams.  These Care Teams include your primary Cardiologist (physician) and Advanced Practice Providers (APPs -  Physician Assistants and Nurse Practitioners) who all work together to provide you with the care you need, when you need it.  We recommend signing up for the patient portal called "MyChart".  Sign up information is provided on this After Visit Summary.  MyChart is used to connect with patients for Virtual Visits (Telemedicine).  Patients are able to view lab/test results, encounter notes, upcoming appointments, etc.  Non-urgent messages can be sent to your provider as well.   To learn more about what you can do with MyChart, go to [email or web address].    Your next appointment:    Keep follow up appointment   The format for your next appointment:   In Person  Provider:   Fabian Sharp, PA-C        Other Instructions Monitor blood pressure. Report systolic BP (top number) consistently less than 100, feelings of fatigue or  dizziness.   Important Information About Sugar

## 2022-05-13 NOTE — Progress Notes (Signed)
Office Visit    Patient Name: Wayne Vang Date of Encounter: 05/13/2022  Primary Care Provider:  Agramonte Loffler, MD Primary Cardiologist:  Donato Heinz, MD  Chief Complaint    61 year old male with a history of CAD s/p CABG x3 in 03/2022, ICM, V-fib arrest, hyperlipidemia, seizure disorder, hiatal hernia and GERD who presents for hospital follow-up related to CAD.  Past Medical History    Past Medical History:  Diagnosis Date   Acute MI, inferior wall ([place]) 04/17/2022   Esophageal reflux    HFrEF (heart failure with reduced ejection fraction) ([place]) 04/19/2022   Hiatal hernia    Hyperlipidemia    Low back pain    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   Seizure disorder ([place])    Skin cancer    "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"   Tremor    Past Surgical History:  Procedure Laterality Date   COLONOSCOPY     CORONARY ARTERY BYPASS GRAFT N/A 03/26/2022   Procedure: CORONARY ARTERY BYPASS GRAFTING (CABG) X THREE, USING LEFT INTERNAL MAMMARY ARTERY AND RIGHT LEG GREATER SAPHENOUS VEIN;  Surgeon: [name], MD;  Location: [place];  Service: Open Heart Surgery;  Laterality: N/A;   CORONARY BALLOON ANGIOPLASTY N/A 04/17/2022   Procedure: CORONARY BALLOON ANGIOPLASTY;  Surgeon: Jettie Booze, MD;  Location: [facility];  Service: Cardiovascular;  Laterality: N/A;   INGUINAL HERNIA REPAIR Right 12/06/2013   Procedure:  REPAIR RIGHT INGUINAL HERNIA;  Surgeon: Joyice Faster. Cornett, MD;  Location: [facility];  Service: General;  Laterality: Right;   INGUINAL HERNIA REPAIR Left 07/21/2013   INSERTION OF MESH N/A 12/06/2013   Procedure: INSERTION OF MESH;  Surgeon: Joyice Faster. Cornett, MD;  Location: [facility];  Service: General;  Laterality: N/A;   LEFT HEART CATH AND CORONARY ANGIOGRAPHY N/A 03/20/2022   Procedure: LEFT HEART CATH AND CORONARY ANGIOGRAPHY;  Surgeon: Jettie Booze, MD;  Location: [facility];  Service: Cardiovascular;  Laterality:  N/A;   LEFT HEART CATH AND CORONARY ANGIOGRAPHY N/A 04/17/2022   Procedure: LEFT HEART CATH AND CORONARY ANGIOGRAPHY;  Surgeon: Jettie Booze, MD;  Location: [facility];  Service: Cardiovascular;  Laterality: N/A;   LEFT HEART CATH AND CORS/GRAFTS ANGIOGRAPHY N/A 04/17/2022   Procedure: LEFT HEART CATH AND CORS/GRAFTS ANGIOGRAPHY;  Surgeon: Jettie Booze, MD;  Location: [facility];  Service: Cardiovascular;  Laterality: N/A;   LEFT HEART CATH AND CORS/GRAFTS ANGIOGRAPHY N/A 05/01/2022   Procedure: LEFT HEART CATH AND CORS/GRAFTS ANGIOGRAPHY;  Surgeon: Jettie Booze, MD;  Location: [facility];  Service: Cardiovascular;  Laterality: N/A;   POLYPECTOMY     TEE WITHOUT CARDIOVERSION N/A 03/26/2022   Procedure: TRANSESOPHAGEAL ECHOCARDIOGRAM (TEE);  Surgeon: [name], MD;  Location: [facility];  Service: Open Heart Surgery;  Laterality: N/A;    Allergies  Allergies  Allergen Reactions   Nexium [Esomeprazole] Other (See Comments)    Abdominal pain   Zocor [Simvastatin] Other (See Comments)    Ineffective     Gaviscon [Alum Hydroxide-Mag Carbonate] Other (See Comments)    Medication interaction only    History of Present Illness    61 year old male with the above past medical history including CAD s/p CABG x3 in 03/2022, ICM, V-fib arrest, hyperlipidemia, seizure disorder, hiatal hernia and GERD.  He was hospitalized in 02/2022 in the setting of NSTEMI, chest pain.  Catheterization revealed main equivalent disease with proximal LAD and ostial  left circumflex stenosis.  He underwent CABG x3 (LIMA-LAD, SVG-RI and OM 1) on 03/26/2022.  Echocardiogram revealed EF 60 to 65%, mild LVH, G1 DD, no significant other disease.  Unfortunately, he returned to the ED on 04/17/2022 following the sudden onset of severe chest pain.  Code STEMI was activated, he suffered a V-fib arrest with ROSC after 1 shock.  He was taken to the Cath Lab and underwent emergent cardiac  catheterization which revealed patent grafts but 99% occlusion of the graft insertion site into the OM with thrombus s/p POBA of anastomosis.  Antiplatelet therapy was complicated by antiseizure medication.  It was felt that Brilinta the effectiveness would be compromised by phenytoin.  Ultimately his Plavix was switched to Effient. Repeat echocardiogram on 04/17/2022 post V-fib arrest showed EF 45 to 50%, mildly decreased LV function, small pericardial effusion, no evidence of tamponade. Repeat echocardiogram was recommended in 3 months.  He was discharged home in stable condition, Unfortunately he returned to the ED on 04/30/2022 with midsternal chest pain, relieved with nitroglycerin.  Troponin was negative.  Repeat cardiac catheterization revealed patent grafts, EF 55 to 65%.  He was started on Imdur.  He was discharged home in stable condition on 05/02/2022.  He presents today for follow-up accompanied by his wife.  Since his hospitalization he has been stable from a cardiac standpoint.  He denies any recurrent chest pain, though he does note occasional discomfort in his left collarbone and shoulder, this is positional and occurs mostly when he is sleeping at night.  He denies any palpitations, dizziness, presyncope, syncope, edema, PND, orthopnea, weight gain.  His BP has been low the past couple of days, he is generally asymptomatic but does note some feelings of tiredness in the morning.  He does attribute this somewhat to poor sleep quality since his surgery.  Otherwise, he reports feeling well and denies any additional concerns today.  Home Medications    Current Outpatient Medications  Medication Sig Dispense Refill   acetaminophen (TYLENOL) 500 MG tablet Take 1,000 mg by mouth daily as needed for mild pain or headache.     aspirin EC 81 MG tablet Take 1 tablet (81 mg total) by mouth daily. Swallow whole. 30 tablet    Cholecalciferol (VITAMIN D-3 PO) Take 1 tablet by mouth daily.      lansoprazole (PREVACID) 30 MG capsule TAKE 1 CAPSULE DAILY BEFORE BREAKFAST 90 capsule 3   metoprolol succinate (TOPROL-XL) 25 MG 24 hr tablet Take 1 tablet (25 mg total) by mouth daily. 90 tablet 2   nitroGLYCERIN (NITROSTAT) 0.4 MG SL tablet Place 1 tablet (0.4 mg total) under the tongue every 5 (five) minutes as needed for chest pain. 25 tablet 2   phenytoin (DILANTIN) 100 MG ER capsule Take 2-3 capsules (200-300 mg total) by mouth See admin instructions. Take 2 capsules (200 mg) every morning and then take 3 capsules (300 mg) at bedtime     prasugrel (EFFIENT) 10 MG TABS tablet Take 1 tablet (10 mg total) by mouth daily. 90 tablet 3   rosuvastatin (CRESTOR) 40 MG tablet Take 1 tablet (40 mg total) by mouth daily. 90 tablet 1   isosorbide mononitrate (IMDUR) 30 MG 24 hr tablet Take 1 tablet (30 mg total) by mouth daily. 90 tablet 3   No current facility-administered medications for this visit.     Review of Systems    He denies chest pain, palpitations, dyspnea, pnd, orthopnea, n, v, dizziness, syncope, edema, weight gain, or early satiety.  All other systems reviewed and are otherwise negative except as noted above.   Physical Exam    VS:  BP 90/64 (BP Location: Right Arm, Patient Position: Sitting, Cuff Size: Normal)   Pulse 64   Ht '6\' 2"'$  (1.88 m)   Wt 221 lb (100.2 kg)   SpO2 96%   BMI 28.37 kg/m   GEN: Well nourished, well developed, in no acute distress. HEENT: normal. Neck: Supple, no JVD, carotid bruits, or masses. Cardiac: RRR, no murmurs, rubs, or gallops. No clubbing, cyanosis, edema.  Radials/DP/PT 2+ and equal bilaterally.  Respiratory:  Respirations regular and unlabored, clear to auscultation bilaterally. GI: Soft, nontender, nondistended, BS + x 4. MS: no deformity or atrophy. Skin: warm and dry, no rash. Neuro:  Strength and sensation are intact. Psych: Normal affect.  Accessory Clinical Findings    ECG personally reviewed by me today -NSR, 64 bpm- no acute  changes.   Lab Results  Component Value Date   WBC 5.6 05/01/2022   HGB 14.9 05/01/2022   HCT 40.5 05/01/2022   MCV 87.3 05/01/2022   PLT 188 05/01/2022   Lab Results  Component Value Date   CREATININE 0.78 05/12/2022   BUN 12 05/12/2022   NA 140 05/12/2022   K 4.6 05/12/2022   CL 102 05/12/2022   CO2 31 05/12/2022   Lab Results  Component Value Date   ALT 14 04/17/2022   AST 18 04/17/2022   ALKPHOS 108 04/17/2022   BILITOT 0.5 04/17/2022   Lab Results  Component Value Date   CHOL 165 04/17/2022   HDL 79 04/17/2022   LDLCALC 61 04/17/2022   LDLDIRECT 194.1 04/27/2009   TRIG 123 04/17/2022   CHOLHDL 2.1 04/17/2022    Lab Results  Component Value Date   HGBA1C 4.4 (L) 04/17/2022    Assessment & Plan    1. CAD/chest pain: S/p CABG x3 in 03/2022.  On aspirin and Effient due to chronic phenytoin use.  Follow-up cath s/p V-fib arrest/STEMI on 04/17/2022 revealed patent grafts but 99% occlusion of the graft insertion site into the OM with thrombus s/p POBA of anastomosis. Now s/p repeat catheterization in the setting of chest pain on 05/01/2022 which revealed patent grafts.  He denies any recurrent chest pain.BP is borderline low, he is generally asymptomatic.  Continue to monitor, if BP remains low, may need to consider reducing Imdur versus metoprolol.  Continue aspirin, Effient, metoprolol, Imdur, Crestor.  2. History of V-fib arrest: Occurred on 04/17/2022 with ROSC after 1 shock in the setting of STEMI.  Continue beta-blocker, no LifeVest recommended.  3. ICM: 04/17/2022 post V-fib arrest showed EF 45 to 50%, mildly decreased LV function, small pericardial effusion, no evidence of tamponade.  EF on recent cath was 55 to 65%. Euvolemic and well compensated on exam.  Consider repeat echocardiogram in 3 months.  Continue current medications as above.  4. Hyperlipidemia: LDL was 61 in 03/2022.  Continue aspirin, Crestor.  We will repeat fasting lipids, LFTs.  5. Seizure  disorder: Continue phenytoin.  6. Disposition: Follow-up as scheduled in 05/2022.      Lenna Sciara, NP 05/13/2022, 1:16 PM

## 2022-05-19 DIAGNOSIS — I25118 Atherosclerotic heart disease of native coronary artery with other forms of angina pectoris: Secondary | ICD-10-CM | POA: Diagnosis not present

## 2022-05-19 DIAGNOSIS — E785 Hyperlipidemia, unspecified: Secondary | ICD-10-CM | POA: Diagnosis not present

## 2022-05-19 DIAGNOSIS — I255 Ischemic cardiomyopathy: Secondary | ICD-10-CM | POA: Diagnosis not present

## 2022-05-19 DIAGNOSIS — I4901 Ventricular fibrillation: Secondary | ICD-10-CM | POA: Diagnosis not present

## 2022-05-20 ENCOUNTER — Other Ambulatory Visit: Payer: Self-pay

## 2022-05-20 ENCOUNTER — Telehealth: Payer: Self-pay

## 2022-05-20 DIAGNOSIS — E785 Hyperlipidemia, unspecified: Secondary | ICD-10-CM

## 2022-05-20 DIAGNOSIS — Z79899 Other long term (current) drug therapy: Secondary | ICD-10-CM

## 2022-05-20 LAB — LIPID PANEL
Chol/HDL Ratio: 2.1 ratio (ref 0.0–5.0)
Cholesterol, Total: 194 mg/dL (ref 100–199)
HDL: 92 mg/dL (ref >39)
LDL Chol Calc (NIH): 84 mg/dL (ref 0–99)
Triglycerides: 103 mg/dL (ref 0–149)
VLDL Cholesterol Cal: 18 mg/dL (ref 5–40)

## 2022-05-20 LAB — HEPATIC FUNCTION PANEL
ALT: 12 IU/L (ref 0–44)
AST: 15 IU/L (ref 0–40)
Albumin: 5 g/dL — ABNORMAL HIGH (ref 3.9–4.9)
Alkaline Phosphatase: 108 IU/L (ref 44–121)
Bilirubin Total: 0.4 mg/dL (ref 0.0–1.2)
Bilirubin, Direct: 0.14 mg/dL (ref 0.00–0.40)
Total Protein: 6.9 g/dL (ref 6.0–8.5)

## 2022-05-20 MED ORDER — EZETIMIBE 10 MG PO TABS: 10.0000 mg | ORAL_TABLET | Freq: Every day | ORAL | 3 refills | Status: DC

## 2022-05-20 NOTE — Telephone Encounter (Signed)
Spoke with pt. Pt was notified of results and recommendations. Pt will start Zetia 10 mg and repeat labs in 6-8 weeks. Medication sent to pts pharmacy and lab orders placed.

## 2022-05-21 DIAGNOSIS — C44612 Basal cell carcinoma of skin of right upper limb, including shoulder: Secondary | ICD-10-CM | POA: Diagnosis not present

## 2022-05-22 ENCOUNTER — Encounter: Payer: Self-pay | Admitting: Physician Assistant

## 2022-05-22 ENCOUNTER — Ambulatory Visit: Payer: BC Managed Care – PPO | Admitting: Physician Assistant

## 2022-05-22 VITALS — BP 126/68 | HR 75 | Ht 74.0 in | Wt 226.1 lb

## 2022-05-22 DIAGNOSIS — R1310 Dysphagia, unspecified: Secondary | ICD-10-CM

## 2022-05-22 DIAGNOSIS — K219 Gastro-esophageal reflux disease without esophagitis: Secondary | ICD-10-CM | POA: Diagnosis not present

## 2022-05-22 MED ORDER — LANSOPRAZOLE 30 MG PO CPDR: DELAYED_RELEASE_CAPSULE | ORAL | 4 refills | Status: DC

## 2022-05-22 NOTE — Patient Instructions (Signed)
You have been scheduled for a Barium Esophogram at [facility] (1st floor of the hospital) on 06/04/22 at 10:00 am . Please arrive 30 minutes prior to your appointment for registration. Make certain not to have anything to eat or drink 3 hours prior to your test. If you need to reschedule for any reason, please contact radiology at [phone] to do so. __________________________________________________________________ A barium swallow is an examination that concentrates on views of the esophagus. This tends to be a double contrast exam (barium and two liquids which, when combined, create a gas to distend the wall of the oesophagus) or single contrast (non-ionic iodine based). The study is usually tailored to your symptoms so a good history is essential. Attention is paid during the study to the form, structure and configuration of the esophagus, looking for functional disorders (such as aspiration, dysphagia, achalasia, motility and reflux) EXAMINATION You may be asked to change into a gown, depending on the type of swallow being performed. A radiologist and radiographer will perform the procedure. The radiologist will advise you of the type of contrast selected for your procedure and direct you during the exam. You will be asked to stand, sit or lie in several different positions and to hold a small amount of fluid in your mouth before being asked to swallow while the imaging is performed .In some instances you may be asked to swallow barium coated marshmallows to assess the motility of a solid food bolus. The exam can be recorded as a digital or video fluoroscopy procedure. POST PROCEDURE It will take 1-2 days for the barium to pass through your system. To facilitate this, it is important, unless otherwise directed, to increase your fluids for the next 24-48hrs and to resume your normal diet.  This test typically takes about 30 minutes to  perform. __________________________________________________________________________________  We have sent the following medications to your pharmacy for you to pick up at your convenience: Prevacid   Try to follow a soft diet, cut food into small bites, sip fluids between bites of food to help with swallowing.   Due to recent changes in healthcare laws, you may see the results of your imaging and laboratory studies on MyChart before your provider has had a chance to review them.  We understand that in some cases there may be results that are confusing or concerning to you. Not all laboratory results come back in the same time frame and the provider may be waiting for multiple results in order to interpret others.  Please give [place] 48 hours in order for your provider to thoroughly review all the results before contacting the office for clarification of your results.   _______________________________________________________  If you are age 35 or older, your body mass index should be between 23-30. Your Body mass index is 29.03 kg/m. If this is out of the aforementioned range listed, please consider follow up with your Primary Care Provider.  If you are age 6 or younger, your body mass index should be between 19-25. Your Body mass index is 29.03 kg/m. If this is out of the aformentioned range listed, please consider follow up with your Primary Care Provider.   ________________________________________________________  The [facility] providers would like to encourage you to use [facility] to communicate with providers for non-urgent requests or questions.  Due to long hold times on the telephone, sending your provider a message by [facility] may be a faster and more efficient way to get a response.  Please allow 48 business  hours for a response.  Please remember that this is for non-urgent requests.  _______________________________________________________  Thank you for choosing me and [facility].  Amy Esterwood -PA-C

## 2022-05-22 NOTE — Progress Notes (Signed)
Subjective:    Patient ID: Wayne Vang, male    DOB: Aug 02, 1960, 61 y.o.   MRN: [id number]  HPI [place] is a pleasant 61 year old white male, established with Dr. Silverio Decamp.  He comes in today with complaints of GERD symptoms and solid food dysphagia. He has not had an EGD here but believes he had 1 remotely in [place] and required an esophageal dilation. He was seen here in January 2023 and underwent colonoscopy with removal of a 7 mm sigmoid polyp, was noted to have a few diverticuli and internal and external hemorrhoids.  Path on the polyp was consistent with a hyperplastic polyp.  Unfortunately patient had an MI on 03/26/2022 and underwent CABG x3.  He had an uncomplicated postop course, after discharge she returned to the emergency room on 04/17/2022 with recurrent chest pain and ST elevation, he had a V-fib arrest in the emergency room, then was immediately taken to the Cath Lab.  He had 99% stenosis of the insertion of the SVG to OM which was treated with balloon angioplasty.  EF noted to be 45 to 50%.  He returned to the emergency room again on 04/30/2022 with chest pain, however EKG and troponins were negative.  He was admitted and had a relook catheterization which showed widely patent SVG to OM.  The patient mentions that he had been having a full feeling or sensation as if something was "stuck" in his esophagus and in retrospect says he was having some of the symptoms prior to the initial MI.  This seems to be worse with medications or certain foods like meats and he has had 2 drink fluid to push foods down.  He was having some increased heartburn symptoms despite being on Prevacid 30 mg daily after he was hospitalized in early September but says that has improved. Not having any active heartburn or indigestion now, no nocturnal symptoms but says he used to have nocturnal reflux prior to starting on PPI. Currently having symptoms about every day with solid food dysphagia and some pill  dysphagia, no episodes requiring regurgitation. He is currently on Effient and aspirin. Other medical issues include seizure disorder.  Review of Systems. Pertinent positive and negative review of systems were noted in the above HPI section.  All other review of systems was otherwise negative.   Outpatient Encounter Medications as of 05/22/2022  Medication Sig   acetaminophen (TYLENOL) 500 MG tablet Take 1,000 mg by mouth daily as needed for mild pain or headache.   aspirin EC 81 MG tablet Take 1 tablet (81 mg total) by mouth daily. Swallow whole.   Cholecalciferol (VITAMIN D-3 PO) Take 1 tablet by mouth daily.   ezetimibe (ZETIA) 10 MG tablet Take 1 tablet (10 mg total) by mouth daily.   isosorbide mononitrate (IMDUR) 30 MG 24 hr tablet Take 1 tablet (30 mg total) by mouth daily.   metoprolol succinate (TOPROL-XL) 25 MG 24 hr tablet Take 1 tablet (25 mg total) by mouth daily.   nitroGLYCERIN (NITROSTAT) 0.4 MG SL tablet Place 1 tablet (0.4 mg total) under the tongue every 5 (five) minutes as needed for chest pain.   phenytoin (DILANTIN) 100 MG ER capsule Take 2-3 capsules (200-300 mg total) by mouth See admin instructions. Take 2 capsules (200 mg) every morning and then take 3 capsules (300 mg) at bedtime   prasugrel (EFFIENT) 10 MG TABS tablet Take 1 tablet (10 mg total) by mouth daily.   rosuvastatin (CRESTOR) 40 MG tablet Take 1 tablet (  40 mg total) by mouth daily.   [DISCONTINUED] lansoprazole (PREVACID) 30 MG capsule TAKE 1 CAPSULE DAILY BEFORE BREAKFAST   lansoprazole (PREVACID) 30 MG capsule TAKE 1 CAPSULE DAILY BEFORE BREAKFAST   No facility-administered encounter medications on file as of 05/22/2022.   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   Hyperlipidemia 01/26/2009   GERD 04/21/2007   Social History   Socioeconomic History   Marital status: Married    Spouse name: Not on file   Number of children: 1   Years of education: Not on file   Highest education level: Not on file  Occupational History   Occupation: Shop Printmaker: [employer]    Comment: [employer]  Tobacco Use   Smoking status: Former    Types: Cigarettes    Quit date: 07/21/2002    Years since quitting: 19.8   Smokeless tobacco: Never  Vaping Use   Vaping Use: Never used  Substance and Sexual Activity   Alcohol use: Yes    Comment: socially   Drug use: No   Sexual activity: Not on file  Other Topics Concern   Not on file  Social History Narrative      He has been married 31+ years.   Caffeine Use: He drinks coke, tea, and coffee.   Truck Dealer   Social Determinants of Health   Financial Resource Strain: Not on file  Food Insecurity: No Food Insecurity (03/29/2022)   Hunger Vital Sign    Worried About Running Out of Food in the Last Year: Never true    [place] in the Last Year: Never true  Transportation Needs: No Transportation Needs (03/29/2022)   PRAPARE - Hydrologist (Medical): No    Lack of Transportation (Non-Medical): No  Physical Activity: Not on file  Stress: Not on file  Social Connections: Not on file  Intimate Partner Violence: Not At Risk (03/29/2022)   Humiliation, Afraid, Rape, and Kick questionnaire    Fear of Current or Ex-Partner: No    Emotionally Abused: No    Physically Abused: No    Sexually Abused: No     Mr. Afonso family history includes Alcohol abuse in his brother; Alzheimer's disease in his mother; Cancer in his father; Colon polyps in his sister; Stroke in his sister.      Objective:    Vitals:   05/22/22 0959  BP: 126/68  Pulse: 75    Physical Exam Well-developed well-nourished older white male in no acute distress.  Height, Weight, 226 BMI 29.0 accompanied by his wife  HEENT; nontraumatic normocephalic, EOMI, PE R LA, sclera anicteric. Oropharynx; not examined today Neck; supple, no JVD Cardiovascular; regular rate and rhythm with S1-S2, no murmur rub or gallop, healing sternal incisional scar Pulmonary; Clear bilaterally Abdomen; soft, nontender, nondistended, no palpable mass or hepatosplenomegaly, bowel sounds are  active Rectal; not done today Skin; benign exam, no jaundice rash or appreciable lesions Extremities; no clubbing cyanosis or edema skin warm and dry Neuro/Psych; alert and oriented x4, grossly nonfocal mood and affect appropriate        Assessment & Plan:   #64 61 year old white male with history of chronic GERD, maintained on Prevacid 30 mg daily with new onset of solid food dysphagia over the past few months with symptoms occurring almost on a daily basis but no episodes requiring regurgitation.  He has not had EGD here but believes he had a remote EGD  perhaps 20 years ago with esophageal dilation.  Symptoms are consistent with distal esophageal stricture, rule out esophageal ring  #2 very recent MI 03/26/2022 and CABG x3, then readmission 04/17/2022 with recurrent chest pain, V-fib arrest in the emergency room and repeat cath showing a 99% stenosis and required balloon angioplasty. Repeat admission 04/30/2022 with recurrent chest pain, negative troponins and relook catheterization showing widely patent SVG to OM  #3 dual antiplatelet therapy  with Effient and aspirin #4 seizure disorder #5.  Congestive heart failure most recent EF 45 to 50% #6  colon cancer surveillance-up-to-date with last colonoscopy January 23, 1 hyperplastic polyp and diverticulosis  Plan; patient is not a candidate for endoscopic evaluation at present, less than 2 months out post MI and CABG, then separate angioplasty 04/17/2022  Will proceed with barium swallow with tablet, if stricture identified, then will communicate with cardiology regarding earliest time that it would be safe to interrupt his Effient as I suspect will be at least at a 35-monthmark.  Continue Prevacid 30 mg p.o. AC dinner- We discussed avoiding larger chunks of meat and dry breads, he needs to cut his food into very small pieces, chew slowly, and sip fluids between bites.   Kadeisha Betsch S Karigan Cloninger PA-C 05/22/2022   Cc: COwens Loffler MD

## 2022-05-26 NOTE — Progress Notes (Signed)
Cardiology Office Note:    Date:  06/09/2022   ID:  Wayne Vang, DOB 11-21-1960, MRN [id number]  PCP:  Yero Loffler, [place] Providers Cardiologist:  Donato Heinz, MD Cardiology APP:  Ledora Bottcher, [place] { Referring MD: [place] Loffler, MD   Chief Complaint  Patient presents with   Follow-up    Post CABG    History of Present Illness:    Wayne Vang is a 61 y.o. male with a hx of CAD s/p CABG, HLD, seizure disorder, GERD, and hiatal hernia. He was hospitalized 03/19/22 with chest pain and elevated troponin. Heart catheterization revealed left main equivalent disease with proximal LAD and ostial LCX disease. He was recommended for CABG revascularization. Echocardiogram with preserved EF 60-65%, mild LVH, grade 1 DD, and no significant valvular disease. He underwent CABG x 3 with LIMA-LAD and SVG-RI-IM1 on 03/26/22. Post op course was unremarkable. Unfortunately, he presented back to [facility] 04/17/22 for sudden onset severe CP. CODE STEMI activated in the field. While in the ER, he suffered a Vfib arrest with ROSC after 1 shock. He was taken to the cath lab emergently for heart cath. HS troponin trended to 2080. LHC showed patent grafts but 99% occlusion of the OM2 at the graft insertion site treated with PTCA. Antiplatelet therapy felt complicated by his antiseizure medication phenytoin.  It was felt brilinta's effectiveness would be diminished by phenytoin. Plavix was switched to effient.  I saw him in follow up 04/28/22 and he was doing well. Unfortunately he developed mid sternal CP prompting ER evaluation. Repeat heart cath did not reveal any new lesions. He was discharged on continued medical therapy.   He presents for follow up. He complains of upper chest soreness that sounds consistent with soreness from sternotomy. He also complains of arm muscle pain at rest and with activity.  He is slowly trying to incorporate more upper body movement and his  activities.  Unclear if this is related to statin medication.  He does recount that he was intolerant to simvastatin in the past, which caused full body muscle aches and pains which resolved when he stopped simvastatin.  We discussed possibly a trial off of Crestor.  This trial would be complicated by his returning to work as a Programmer, systems.  Past Medical History:  Diagnosis Date   Acute MI, inferior wall ([place]) 04/17/2022   Esophageal reflux    HFrEF (heart failure with reduced ejection fraction) ([place]) 04/19/2022   Hiatal hernia    Hyperlipidemia    Low back pain    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   Seizure disorder ([place])    Skin cancer    "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"   Tremor     Past Surgical History:  Procedure Laterality Date   COLONOSCOPY     CORONARY ARTERY BYPASS GRAFT N/A 03/26/2022   Procedure: CORONARY ARTERY BYPASS GRAFTING (CABG) X THREE, USING LEFT INTERNAL MAMMARY ARTERY AND RIGHT LEG GREATER SAPHENOUS VEIN;  Surgeon: [name], MD;  Location: [place];  Service: Open Heart Surgery;  Laterality: N/A;   CORONARY BALLOON ANGIOPLASTY N/A 04/17/2022   Procedure: CORONARY BALLOON ANGIOPLASTY;  Surgeon: Jettie Booze, MD;  Location: [place] [facility];  Service: Cardiovascular;  Laterality: N/A;   INGUINAL HERNIA REPAIR Right 12/06/2013   Procedure:  REPAIR RIGHT INGUINAL HERNIA;  Surgeon: Joyice Faster. Cornett, MD;  Location: [place];  Service: General;  Laterality: Right;   INGUINAL HERNIA REPAIR  Left 07/21/2013   INSERTION OF MESH N/A 12/06/2013   Procedure: INSERTION OF MESH;  Surgeon: Joyice Faster. Cornett, MD;  Location: [place];  Service: General;  Laterality: N/A;   LEFT HEART CATH AND CORONARY ANGIOGRAPHY N/A 03/20/2022   Procedure: LEFT HEART CATH AND CORONARY ANGIOGRAPHY;  Surgeon: Jettie Booze, MD;  Location: [place] [facility];  Service: Cardiovascular;  Laterality: N/A;   LEFT HEART CATH AND CORONARY ANGIOGRAPHY N/A 04/17/2022    Procedure: LEFT HEART CATH AND CORONARY ANGIOGRAPHY;  Surgeon: Jettie Booze, MD;  Location: [place] [facility];  Service: Cardiovascular;  Laterality: N/A;   LEFT HEART CATH AND CORS/GRAFTS ANGIOGRAPHY N/A 04/17/2022   Procedure: LEFT HEART CATH AND CORS/GRAFTS ANGIOGRAPHY;  Surgeon: Jettie Booze, MD;  Location: [place] [facility];  Service: Cardiovascular;  Laterality: N/A;   LEFT HEART CATH AND CORS/GRAFTS ANGIOGRAPHY N/A 05/01/2022   Procedure: LEFT HEART CATH AND CORS/GRAFTS ANGIOGRAPHY;  Surgeon: Jettie Booze, MD;  Location: [place] [facility];  Service: Cardiovascular;  Laterality: N/A;   POLYPECTOMY     TEE WITHOUT CARDIOVERSION N/A 03/26/2022   Procedure: TRANSESOPHAGEAL ECHOCARDIOGRAM (TEE);  Surgeon: [name], MD;  Location: [place];  Service: Open Heart Surgery;  Laterality: N/A;    Current Medications: Current Meds  Medication Sig   acetaminophen (TYLENOL) 500 MG tablet Take 1,000 mg by mouth daily as needed for mild pain or headache.   aspirin EC 81 MG tablet Take 1 tablet (81 mg total) by mouth daily. Swallow whole.   Cholecalciferol (VITAMIN D-3 PO) Take 1 tablet by mouth daily.   isosorbide mononitrate (IMDUR) 30 MG 24 hr tablet Take 1 tablet (30 mg total) by mouth daily.   lansoprazole (PREVACID) 30 MG capsule TAKE 1 CAPSULE DAILY BEFORE BREAKFAST   metoprolol succinate (TOPROL-XL) 25 MG 24 hr tablet Take 1 tablet (25 mg total) by mouth daily.   nitroGLYCERIN (NITROSTAT) 0.4 MG SL tablet Place 1 tablet (0.4 mg total) under the tongue every 5 (five) minutes as needed for chest pain.   phenytoin (DILANTIN) 100 MG ER capsule Take 2-3 capsules (200-300 mg total) by mouth See admin instructions. Take 2 capsules (200 mg) every morning and then take 3 capsules (300 mg) at bedtime   prasugrel (EFFIENT) 10 MG TABS tablet Take 1 tablet (10 mg total) by mouth daily.   rosuvastatin (CRESTOR) 40 MG tablet Take 1 tablet (40 mg total) by mouth daily.   [DISCONTINUED]  ezetimibe (ZETIA) 10 MG tablet Take 1 tablet (10 mg total) by mouth daily.     Allergies:   Nexium [esomeprazole], Zocor [simvastatin], and Gaviscon [alum hydroxide-mag carbonate]   Social History   Socioeconomic History   Marital status: Married    Spouse name: Not on file   Number of children: 1   Years of education: Not on file   Highest education level: Not on file  Occupational History   Occupation: Shop Printmaker: [employer]    Comment: [employer]  Tobacco Use   Smoking status: Former    Types: Cigarettes    Quit date: 07/21/2002    Years since quitting: 19.8   Smokeless tobacco: Never  Vaping Use   Vaping Use: Never used  Substance and Sexual Activity   Alcohol use: Yes    Comment: socially   Drug use: No   Sexual activity: Not on file  Other Topics Concern   Not on file  Social History  Narrative      He has been married 31+ years.   Caffeine Use: He drinks coke, tea, and coffee.   Truck Dealer   Social Determinants of Health   Financial Resource Strain: Not on file  Food Insecurity: No Food Insecurity (03/29/2022)   Hunger Vital Sign    Worried About Running Out of Food in the Last Year: Never true    [place] in the Last Year: Never true  Transportation Needs: No Transportation Needs (03/29/2022)   PRAPARE - Hydrologist (Medical): No    Lack of Transportation (Non-Medical): No  Physical Activity: Not on file  Stress: Not on file  Social Connections: Not on file     Family History: The patient's family history includes Alcohol abuse in his brother; Alzheimer's disease in his mother; Cancer in his father; Colon polyps in his sister; Stroke in his sister. There is no history of Colon cancer, Esophageal cancer, Stomach cancer, or Rectal cancer.  ROS:   Please see the history of present illness.     All other systems reviewed and are negative.  EKGs/Labs/Other Studies Reviewed:    The  following studies were reviewed today:  Left heart cath 05/01/22:     Prox LAD lesion is 90% stenosed.  LIMA to LAD is patent.   Ost Cx to Prox Cx lesion is 75% stenosed.  SVG to ramus and OM is patent.  Distal part of SVG has decreased in size to better match the OM2 vessel.   1st Mrg-1 lesion is 25% stenosed.   Non-stenotic 1st Mrg-2 lesion was previously treated.  Insertion site is widely patent.   The left ventricular systolic function is normal.   LV end diastolic pressure is normal.   The left ventricular ejection fraction is 55-65% by visual estimate.   There is no aortic valve stenosis.   Continue medical therapy.  Flow to all territories appears normal.   Distal part of SVG has decreased in size and better matches the OM2 vessel.  _______________  Echocardiogram 04/17/2022: Impressions: 1. Limited Echo. Tachycardic making the EF/wall motion assessment  challenging. Mild hypokinesis of the anteroseptal/anterior wall from the  base to the apex. Left ventricular ejection fraction, by estimation, is 45  to 50%. The left ventricle has mildly  decreased function.   2. Right ventricular systolic function is normal. The right ventricular  size is not well visualized.   3. A small pericardial effusion is present. There is no evidence of  cardiac tamponade.   4. No evidence of mitral valve regurgitation.   5. The aortic valve is tricuspid. Aortic valve regurgitation is not  visualized.   6. The inferior vena cava is normal in size with <50% respiratory  variability, suggesting right atrial pressure of 8 mmHg.  _______________   Left Cardiac Catheterization 04/17/2022:   Prox LAD lesion is 90% stenosed.  LIMA to LAD is widely patent.   Ost Cx to Prox Cx lesion is 75% stenosed.  The SVG to ramus portion of the jump graft is widely patent.   1st Mrg-1 lesion is 25% stenosed.   1st Mrg-2 lesion is 99% stenosed at the insertion site of the SVG.   Balloon angioplasty was performed  using a BALLN SAPPHIRE 2.0X12.   Post intervention, there is a 0% residual stenosis.   LV end diastolic pressure is mildly elevated.   There is no aortic valve stenosis.   Successful PTCA of the subtotal occlusion  of the SVG to OM insertion site.  TIMI-3 flow restored.  Patient's symptoms resolved.   Antiplatelet therapy is complicated due to his phenytoin use.  After discussion with pharmacy, will plan to change clopidogrel over to Effient.  Brilinta was initially given for rapid antiplatelet therapy but given chronic phenytoin use, the Brilinta effect will be diminished.  We will load Effient 60 mg this evening.  Start daily Effient tomorrow.   Diagnostic Dominance: Right  Intervention      EKG:  EKG is not ordered today.    Recent Labs: 03/19/2022: TSH 1.321 03/27/2022: Magnesium 2.2 05/01/2022: Hemoglobin 14.9; Platelets 188 05/12/2022: BUN 12; Creatinine, Ser 0.78; Potassium 4.6; Sodium 140 05/19/2022: ALT 12  Recent Lipid Panel    Component Value Date/Time   CHOL 194 05/19/2022 0842   TRIG 103 05/19/2022 0842   HDL 92 05/19/2022 0842   CHOLHDL 2.1 05/19/2022 0842   CHOLHDL 2.1 04/17/2022 1238   VLDL 25 04/17/2022 1238   LDLCALC 84 05/19/2022 0842   LDLDIRECT 194.1 04/27/2009 0901     Risk Assessment/Calculations:                Physical Exam:    VS:  BP 122/76   Pulse 67   Ht '6\' 2"'$  (1.88 m)   Wt 232 lb 3.2 oz (105.3 kg)   SpO2 98%   BMI 29.81 kg/m     Wt Readings from Last 3 Encounters:  06/09/22 232 lb 3.2 oz (105.3 kg)  05/22/22 226 lb 2 oz (102.6 kg)  05/13/22 221 lb (100.2 kg)     GEN:  Well nourished, well developed in no acute distress HEENT: Normal NECK: No JVD; No carotid bruits LYMPHATICS: No lymphadenopathy CARDIAC: RRR, no murmurs, rubs, gallops RESPIRATORY:  Clear to auscultation without rales, wheezing or rhonchi  ABDOMEN: Soft, non-tender, non-distended MUSCULOSKELETAL:  No edema; No deformity  SKIN: Warm and dry NEUROLOGIC:   Alert and oriented x 3 PSYCHIATRIC:  Normal affect   ASSESSMENT:    1. CAD S/P percutaneous coronary angioplasty   2. Hyperlipidemia LDL goal <70   3. S/P CABG x 3   4. Ventricular fibrillation (HCC)   5. HFrEF (heart failure with reduced ejection fraction) ([place])   6. Seizures ([place])    PLAN:    In order of problems listed above:  CAD s/p CABG x 3 LIMA-LAD and SVG-RI-IM1 on 03/26/22 ASA and effient, brilinta not used in the setting of phenytoin use Continue BB and statin   VF arrest VF arrest in [facility] 04/17/22 with ROSC after 1 shock, in the setting of STEMI. No signifcant ventricular arrhythmias following revascularization. Continue BB. No lifevest recommended.    Acute mild cardiomyopathy Echo with LVEF 45-50% with mild hypokinesis of the anterior septal-anterior wall from base to apex with small pericardial effusion. Suspect due to inferior STEMI and VF arrest. Suspect this will improve. GDMT limited by BP, no ARB or spironolactone.  Consider repeat echo in 3 months. Lopressor switched to toprol.    Hyperlipidemia with LDL goal < 70 04/17/2022: Cholesterol 165; HDL 79; LDL Cholesterol 61; Triglycerides 123; VLDL 25 Has been on crestor 40 mg 04/17/2022: VLDL 25 05/19/2022: Cholesterol, Total 194; HDL 92; LDL Chol Calc (NIH) 84; Triglycerides 103 Started on zetia.  Need repeat lipids in 6 weeks-I will also submit a Pharm.D. lipid clinic referral in the event that he is unable to tolerate Crestor or that he is not at goal. -Possible statin myalgias-he will return to work with  a gradual weight limit increase.  He will let us know if his arm soreness persists.  We may need to trial off of Crestor but will wait until he is at his stable weight limit at work.  He states that this is a little different than when he took simvastatin and that the soreness is only in his arms and not full body.   Seizure disorder Continue home phenytoin dosing   Disposition His job requires heavy  lifting at work.  I have advised a very gradual return to work with a 10 pound limit, adding 10 pounds every 2 weeks to limit of 50 pounds for now.   Lipids in 6 weeks Return in 4-5 weeks with Dr. Gardiner Rhyme     Medication Adjustments/Labs and Tests Ordered: Current medicines are reviewed at length with the patient today.  Concerns regarding medicines are outlined above.  Orders Placed This Encounter  Procedures   Lipid Profile   Meds ordered this encounter  Medications   ezetimibe (ZETIA) 10 MG tablet    Sig: Take 1 tablet (10 mg total) by mouth daily.    Dispense:  90 tablet    Refill:  3    Patient Instructions  Medication Instructions:  Your physician recommends that you continue on your current medications as directed. Please refer to the Current Medication list given to you today.  *If you need a refill on your cardiac medications before your next appointment, please call your pharmacy*   Lab Work: Your physician recommends that you return for lab work in 6 weeks for Lipid Panel (07/07/2022) If you have labs (blood work) drawn today and your tests are completely normal, you will receive your results only by: [place] (if you have MyChart) OR A paper copy in the mail If you have any lab test that is abnormal or we need to change your treatment, we will call you to review the results.   Testing/Procedures: NONE   Follow-Up: At [facility], you and your health needs are our priority.  As part of our continuing mission to provide you with exceptional heart care, we have created designated Provider Care Teams.  These Care Teams include your primary Cardiologist (physician) and Advanced Practice Providers (APPs -  Physician Assistants and Nurse Practitioners) who all work together to provide you with the care you need, when you need it.  We recommend signing up for the patient portal called "MyChart".  Sign up information is provided on this After Visit  Summary.  MyChart is used to connect with patients for Virtual Visits (Telemedicine).  Patients are able to view lab/test results, encounter notes, upcoming appointments, etc.  Non-urgent messages can be sent to your provider as well.   To learn more about what you can do with MyChart, go to [email or web address].    Your next appointment:   4-5 month(s)  The format for your next appointment:   In Person  Provider:   Donato Heinz, MD    Also schedule and appointment with Pharm D in 6 weeks (after his labs has been drawn)    Signed, Ledora Bottcher, PA  06/09/2022 2:02 PM    [place]

## 2022-06-04 ENCOUNTER — Ambulatory Visit (HOSPITAL_COMMUNITY)
Admission: RE | Admit: 2022-06-04 | Discharge: 2022-06-04 | Disposition: A | Discharge status: Home / Self Care | Payer: BC Managed Care – PPO | Source: Ambulatory Visit | Attending: Physician Assistant | Admitting: Physician Assistant

## 2022-06-04 DIAGNOSIS — K449 Diaphragmatic hernia without obstruction or gangrene: Secondary | ICD-10-CM | POA: Diagnosis not present

## 2022-06-04 DIAGNOSIS — R1310 Dysphagia, unspecified: Secondary | ICD-10-CM | POA: Diagnosis not present

## 2022-06-04 DIAGNOSIS — K219 Gastro-esophageal reflux disease without esophagitis: Secondary | ICD-10-CM | POA: Diagnosis not present

## 2022-06-09 ENCOUNTER — Ambulatory Visit: Payer: BC Managed Care – PPO | Attending: Physician Assistant | Admitting: Physician Assistant

## 2022-06-09 ENCOUNTER — Encounter: Payer: Self-pay | Admitting: Physician Assistant

## 2022-06-09 VITALS — BP 122/76 | HR 67 | Ht 74.0 in | Wt 232.2 lb

## 2022-06-09 DIAGNOSIS — I4901 Ventricular fibrillation: Secondary | ICD-10-CM | POA: Diagnosis not present

## 2022-06-09 DIAGNOSIS — I5020 Unspecified systolic (congestive) heart failure: Secondary | ICD-10-CM

## 2022-06-09 DIAGNOSIS — R569 Unspecified convulsions: Secondary | ICD-10-CM

## 2022-06-09 DIAGNOSIS — Z951 Presence of aortocoronary bypass graft: Secondary | ICD-10-CM

## 2022-06-09 DIAGNOSIS — E785 Hyperlipidemia, unspecified: Secondary | ICD-10-CM

## 2022-06-09 DIAGNOSIS — Z9861 Coronary angioplasty status: Secondary | ICD-10-CM

## 2022-06-09 DIAGNOSIS — I2510 Atherosclerotic heart disease of native coronary artery without angina pectoris: Secondary | ICD-10-CM | POA: Diagnosis not present

## 2022-06-09 MED ORDER — EZETIMIBE 10 MG PO TABS: 10.0000 mg | ORAL_TABLET | Freq: Every day | ORAL | 3 refills | Status: DC

## 2022-06-09 NOTE — Patient Instructions (Addendum)
Medication Instructions:  Your physician recommends that you continue on your current medications as directed. Please refer to the Current Medication list given to you today.  *If you need a refill on your cardiac medications before your next appointment, please call your pharmacy*   Lab Work: Your physician recommends that you return for lab work in 6 weeks for Lipid Panel (07/07/2022) If you have labs (blood work) drawn today and your tests are completely normal, you will receive your results only by: [place] (if you have MyChart) OR A paper copy in the mail If you have any lab test that is abnormal or we need to change your treatment, we will call you to review the results.   Testing/Procedures: NONE   Follow-Up: At [facility], you and your health needs are our priority.  As part of our continuing mission to provide you with exceptional heart care, we have created designated Provider Care Teams.  These Care Teams include your primary Cardiologist (physician) and Advanced Practice Providers (APPs -  Physician Assistants and Nurse Practitioners) who all work together to provide you with the care you need, when you need it.  We recommend signing up for the patient portal called "MyChart".  Sign up information is provided on this After Visit Summary.  MyChart is used to connect with patients for Virtual Visits (Telemedicine).  Patients are able to view lab/test results, encounter notes, upcoming appointments, etc.  Non-urgent messages can be sent to your provider as well.   To learn more about what you can do with MyChart, go to [email or web address].    Your next appointment:   4-5 month(s)  The format for your next appointment:   In Person  Provider:   Donato Heinz, MD    Also schedule and appointment with Pharm D in 6 weeks (after his labs has been drawn)

## 2022-06-25 ENCOUNTER — Other Ambulatory Visit: Payer: Self-pay

## 2022-06-25 DIAGNOSIS — L814 Other melanin hyperpigmentation: Secondary | ICD-10-CM | POA: Diagnosis not present

## 2022-06-25 DIAGNOSIS — L821 Other seborrheic keratosis: Secondary | ICD-10-CM | POA: Diagnosis not present

## 2022-06-25 DIAGNOSIS — E785 Hyperlipidemia, unspecified: Secondary | ICD-10-CM

## 2022-06-25 DIAGNOSIS — D225 Melanocytic nevi of trunk: Secondary | ICD-10-CM | POA: Diagnosis not present

## 2022-06-25 DIAGNOSIS — L84 Corns and callosities: Secondary | ICD-10-CM | POA: Diagnosis not present

## 2022-06-25 DIAGNOSIS — L570 Actinic keratosis: Secondary | ICD-10-CM | POA: Diagnosis not present

## 2022-06-25 DIAGNOSIS — L738 Other specified follicular disorders: Secondary | ICD-10-CM | POA: Diagnosis not present

## 2022-07-07 DIAGNOSIS — Z79899 Other long term (current) drug therapy: Secondary | ICD-10-CM | POA: Diagnosis not present

## 2022-07-07 DIAGNOSIS — E785 Hyperlipidemia, unspecified: Secondary | ICD-10-CM | POA: Diagnosis not present

## 2022-07-07 LAB — LIPID PANEL
Chol/HDL Ratio: 1.9 ratio (ref 0.0–5.0)
Cholesterol, Total: 174 mg/dL (ref 100–199)
HDL: 92 mg/dL (ref >39)
LDL Chol Calc (NIH): 67 mg/dL (ref 0–99)
Triglycerides: 80 mg/dL (ref 0–149)
VLDL Cholesterol Cal: 15 mg/dL (ref 5–40)

## 2022-07-07 LAB — HEPATIC FUNCTION PANEL
ALT: 16 IU/L (ref 0–44)
AST: 21 IU/L (ref 0–40)
Albumin: 4.9 g/dL (ref 3.9–4.9)
Alkaline Phosphatase: 93 IU/L (ref 44–121)
Bilirubin Total: 0.3 mg/dL (ref 0.0–1.2)
Bilirubin, Direct: 0.12 mg/dL (ref 0.00–0.40)
Total Protein: 7 g/dL (ref 6.0–8.5)

## 2022-07-16 ENCOUNTER — Ambulatory Visit: Payer: PRIVATE HEALTH INSURANCE | Admitting: Family Medicine

## 2022-07-22 ENCOUNTER — Encounter: Payer: Self-pay | Admitting: Family Medicine

## 2022-07-22 ENCOUNTER — Ambulatory Visit: Payer: BC Managed Care – PPO | Admitting: Family Medicine

## 2022-07-22 VITALS — BP 127/81 | HR 96 | Ht 74.0 in | Wt 235.2 lb

## 2022-07-22 DIAGNOSIS — G40109 Localization-related (focal) (partial) symptomatic epilepsy and epileptic syndromes with simple partial seizures, not intractable, without status epilepticus: Secondary | ICD-10-CM

## 2022-07-22 DIAGNOSIS — E559 Vitamin D deficiency, unspecified: Secondary | ICD-10-CM | POA: Diagnosis not present

## 2022-07-22 MED ORDER — PHENYTOIN SODIUM EXTENDED 100 MG PO CAPS: ORAL_CAPSULE | ORAL | 3 refills | Status: DC

## 2022-07-22 NOTE — Patient Instructions (Signed)
Below is our plan:  We will continue phenytoin '200mg'$  in the mornings and '300mg'$  at bedtime.   Please make sure you are consistent with timing of seizure medication. I recommend annual visit with primary care provider (PCP) for complete physical and routine blood work. I recommend daily intake of vitamin D (400-800iu) and calcium (800-'1000mg'$ ) for bone health. Discuss Dexa screening with PCP.   According to [place] law, you can not drive unless you are seizure / syncope free for at least 6 months and under physician's care.  Please maintain precautions. Do not participate in activities where a loss of awareness could harm you or someone else. No swimming alone, no tub bathing, no hot tubs, no driving, no operating motorized vehicles (cars, ATVs, motocycles, etc), lawnmowers, power tools or firearms. No standing at heights, such as rooftops, ladders or stairs. Avoid hot objects such as stoves, heaters, open fires. Wear a helmet when riding a bicycle, scooter, skateboard, etc. and avoid areas of traffic. Set your water heater to 120 degrees or less.  Please make sure you are staying well hydrated. I recommend 50-60 ounces daily. Well balanced diet and regular exercise encouraged. Consistent sleep schedule with 6-8 hours recommended.   Please continue follow up with care team as directed.   Follow up with me in 1 year   You may receive a survey regarding today's visit. I encourage you to leave honest feed back as I do use this information to improve patient care. Thank you for seeing me today!

## 2022-07-22 NOTE — Progress Notes (Signed)
PATIENT: Wayne Vang DOB: March 18, 1961  REASON FOR VISIT: follow up HISTORY FROM: patient  Chief Complaint  Patient presents with   Follow-up    RM 1. Last seen 03/27/21. Seizure f/u.     HISTORY OF PRESENT ILLNESS:  07/22/22 ALL:  Wayne Vang returns for follow up for seizures. He continues phenytoin '200mg'$  every morning and '300mg'$  at bedtime. He reports doing well from a seizure standpoint. 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 He feels that he is recovering fairly well. He is back to work but remains on light duty. He is a Clinical biochemist. He has had a few episodes of dizziness. No seizure events. He is drinking plenty of fluids daily. He continues statin, asa and Effient. He also continues vitamin D 5000iu daily. He is followed closely by PCP and cardiology.   03/27/2021 ALL: Wayne Vang returns for follow up for seizures. He continues phenytoin 200/300. No seizure activity. Vitamin D was low at last follow up 03/2020. He was given rx and advised to follow up with PCP for routine Dexa. He does not think this was done. He has tried to eat more vegetables and fruits. He is taking a calcium supplement but does not think he is taking vitamin D. He continues to have intermittent dizziness. Worse with working in the heat and worse with position changes. He is a Dealer. He doesn't feel it is same dizziness as before that was diagnosed as vertigo. He is drinking 4 bottles of water and 4 bottles of Gatorade every day. He has follow up with Dr Lorelei Pont next week to discuss. He does not routinely check BP. 137/78 today.   03/27/2020 ALL:  Wayne Vang is a 62 y.o. male here today for follow up for seizures. He continues phenytoin '200mg'$  in the mornings and '300mg'$  at bedtime. He is tolerating medication well. No recent seizure activity. Last seizure in 2006. He tried to wean phenytoin in 2018 but reports "feeling funny" and dose was resumed.   He is followed routinely by PCP. He  recently had a CPE. He reports that labs were normal. He is not sure if phenytoin levels were checked. He thinks he has had a recent DEXA but is unsure. He is taking a calcium supplement but unsure if it has vitamin D.     HISTORY: (copied from my note on 03/23/2019)  Wayne Vang is a 62 y.o. male here today for follow up for seizure disorder. He continues phenyotoin 200 in am and 300 in pm. He denies seizure activity. He is tolerating medication well with no adverse effects.    HISTORY: (copied from Dr Gladstone Lighter note on 12/28/2017)   UPDATE (12/28/17, VRP): Since last visit, doing well. Tolerating dilantin. No alleviating or aggravating factors. Tried to lower to '200mg'$  twice a day, but felt "funny" and now back to 200 / 300.    UPDATE 12/23/16: Since last visit, no seizures. Tolerating dilantin. Having more joint aches and pain in the last few years. No other issues.    UPDATE 12/05/15: Since last visit, doing about the same. No seizures. Now off levetiracetam. Continues on phenytoin 200 / 300.    UPDATE 06/04/15: Doing well. No seizures. No vertigo attacks. Tolerating meds.    UPDATE 03/15/14 (LL): since last visit patient has been doing well, reports no seizures. Tolerating Keppra and Dilantin without known side effects.  Still with intermittent episodes of vertigo, unchanged. No complaints today.   UPDATE 12/07/12 (VRP):  since last visit patient doing well. No more seizures. Last seizure was in 2006 in nighttime sleep. His has intermittent episodes of spinning sensation, vertigo, lasting for a few minutes of time. Meclizine seems to help.    PRIOR HPI (11/03/12, Dr. Erling Cruz): 62 year old right-handed white married male with a past history of recurrent dizziness beginning in 1985. EEG 08/09/1983 showed epileptiform activity bilaterally in the frontotemporal regions, more prominent on the right. His first generalized seizure was 08/04/1983 and the second occurred 09/07/1983. Wayne Vang has been on Dilantin or  phenytoin medication since 09/07/1993. His last generalized major motor seizure was 08/13/2004. At that time Keppra was added to his regiment and he has not had a seizure since. Medications are phenytoin sodium capsules 100 mg 2 in the morning and 3 at night and levitercetam 500 mg one half tablet twice per day. He is also on protonix 40 mg once daily. He discontinued simvastatin because of muscle pain. 11/27/2009 he was at work and developed nausea without vomiting. He had to sit down. He felt as if he had to focus on the wall and be very still. He was seen at [facility] emergency room where a Chest x-ray was normal and subsequent stress cardiac test was normal. CBC, basic metabolic panel, CPK, and cardiac markers were normal. A phenytoin level was 15.4. He has not had recurrent symptoms. His last EEG 05/19/2010 was normal. His last MRI of the brain 08/21/2004 showed no significant abnormalities. 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 He denies macropsia, micropsia, strange odors or tastes. The patient has headaches behind his eyes. Over the last 4 months he has recurrent dizziness. His has tingling in his left arm. At one point he was on meclizine, but is no longer taking it. He feels dizzy like "I'm on a boat". Sitting down or lying down helps. There is a sensation of "nearly spinning".The symptoms last 30 minutes to an hour. They can occur every 2-3 days. He is treating them with Dramamine 25 mg per day. He describes a lightheaded sensation or pressure sensation in the eyes.     REVIEW OF SYSTEMS: Out of a complete 14 system review of symptoms, the patient complains only of the following symptoms, dizziness and all other reviewed systems are negative.   ALLERGIES: Allergies  Allergen Reactions   Nexium [Esomeprazole] Other (See Comments)    Abdominal pain   Zocor [Simvastatin] Other (See Comments)    Ineffective     Gaviscon [Alum Hydroxide-Mag Carbonate]  Other (See Comments)    Medication interaction only    HOME MEDICATIONS: Outpatient Medications Prior to Visit  Medication Sig Dispense Refill   acetaminophen (TYLENOL) 500 MG tablet Take 1,000 mg by mouth daily as needed for mild pain or headache.     aspirin EC 81 MG tablet Take 1 tablet (81 mg total) by mouth daily. Swallow whole. 30 tablet    Cholecalciferol (VITAMIN D-3 PO) Take 5,000 Units by mouth daily.     ezetimibe (ZETIA) 10 MG tablet Take 1 tablet (10 mg total) by mouth daily. 90 tablet 3   isosorbide mononitrate (IMDUR) 30 MG 24 hr tablet Take 1 tablet (30 mg total) by mouth daily. 90 tablet 3   lansoprazole (PREVACID) 30 MG capsule TAKE 1 CAPSULE DAILY BEFORE BREAKFAST 90 capsule 4   metoprolol succinate (TOPROL-XL) 25 MG 24 hr tablet Take 1 tablet (25 mg total) by mouth daily. 90 tablet 2   nitroGLYCERIN (NITROSTAT)  0.4 MG SL tablet Place 1 tablet (0.4 mg total) under the tongue every 5 (five) minutes as needed for chest pain. 25 tablet 2   prasugrel (EFFIENT) 10 MG TABS tablet Take 1 tablet (10 mg total) by mouth daily. 90 tablet 3   rosuvastatin (CRESTOR) 40 MG tablet Take 1 tablet (40 mg total) by mouth daily. 90 tablet 1   phenytoin (DILANTIN) 100 MG ER capsule Take 2-3 capsules (200-300 mg total) by mouth See admin instructions. Take 2 capsules (200 mg) every morning and then take 3 capsules (300 mg) at bedtime     No facility-administered medications prior to visit.    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   Seizure disorder ([place])    Skin cancer    "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"   Tremor     PAST SURGICAL HISTORY: Past Surgical History:  Procedure Laterality Date   COLONOSCOPY     CORONARY ARTERY BYPASS GRAFT N/A 03/26/2022    Procedure: CORONARY ARTERY BYPASS GRAFTING (CABG) X THREE, USING LEFT INTERNAL MAMMARY ARTERY AND RIGHT LEG GREATER SAPHENOUS VEIN;  Surgeon: [name], MD;  Location: [facility];  Service: Open Heart Surgery;  Laterality: N/A;   CORONARY BALLOON ANGIOPLASTY N/A 04/17/2022   Procedure: CORONARY BALLOON ANGIOPLASTY;  Surgeon: Jettie Booze, MD;  Location: [facility];  Service: Cardiovascular;  Laterality: N/A;   INGUINAL HERNIA REPAIR Right 12/06/2013   Procedure:  REPAIR RIGHT INGUINAL HERNIA;  Surgeon: Joyice Faster. Cornett, MD;  Location: [facility];  Service: General;  Laterality: Right;   INGUINAL HERNIA REPAIR Left 07/21/2013   INSERTION OF MESH N/A 12/06/2013   Procedure: INSERTION OF MESH;  Surgeon: Joyice Faster. Cornett, MD;  Location: [facility];  Service: General;  Laterality: N/A;   LEFT HEART CATH AND CORONARY ANGIOGRAPHY N/A 03/20/2022   Procedure: LEFT HEART CATH AND CORONARY ANGIOGRAPHY;  Surgeon: Jettie Booze, MD;  Location: [facility];  Service: Cardiovascular;  Laterality: N/A;   LEFT HEART CATH AND CORONARY ANGIOGRAPHY N/A 04/17/2022   Procedure: LEFT HEART CATH AND CORONARY ANGIOGRAPHY;  Surgeon: Jettie Booze, MD;  Location: [facility];  Service: Cardiovascular;  Laterality: N/A;   LEFT HEART CATH AND CORS/GRAFTS ANGIOGRAPHY N/A 04/17/2022   Procedure: LEFT HEART CATH AND CORS/GRAFTS ANGIOGRAPHY;  Surgeon: Jettie Booze, MD;  Location: [facility];  Service: Cardiovascular;  Laterality: N/A;   LEFT HEART CATH AND CORS/GRAFTS ANGIOGRAPHY N/A 05/01/2022   Procedure: LEFT HEART CATH AND CORS/GRAFTS ANGIOGRAPHY;  Surgeon: Jettie Booze, MD;  Location: [facility];  Service: Cardiovascular;  Laterality: N/A;   POLYPECTOMY     TEE WITHOUT CARDIOVERSION N/A 03/26/2022   Procedure: TRANSESOPHAGEAL ECHOCARDIOGRAM (TEE);  Surgeon: [name], MD;  Location: [facility];  Service: Open Heart Surgery;   Laterality: N/A;    FAMILY HISTORY: Family History  Problem Relation Age of Onset   Alzheimer's disease Mother    Cancer Father    Colon polyps Sister    Stroke Sister    Alcohol abuse Brother    Colon cancer Neg Hx    Esophageal cancer Neg Hx    Stomach cancer Neg Hx  Rectal cancer Neg Hx     SOCIAL HISTORY: Social History   Socioeconomic History   Marital status: Married    Spouse name: Not on file   Number of children: 1   Years of education: Not on file   Highest education level: Not on file  Occupational History   Occupation: Shop Printmaker: [employer]    Comment: [employer]  Tobacco Use   Smoking status: Former    Types: Cigarettes    Quit date: 07/21/2002    Years since quitting: 20.0   Smokeless tobacco: Never  Vaping Use   Vaping Use: Never used  Substance and Sexual Activity   Alcohol use: Yes    Comment: socially   Drug use: No   Sexual activity: Not on file  Other Topics Concern   Not on file  Social History Narrative      He has been married 31+ years.   Caffeine Use: He drinks coke, tea, and coffee.   Truck Dealer   Social Determinants of Health   Financial Resource Strain: Not on file  Food Insecurity: No Food Insecurity (03/29/2022)   Hunger Vital Sign    Worried About Running Out of Food in the Last Year: Never true    [place] in the Last Year: Never true  Transportation Needs: No Transportation Needs (03/29/2022)   PRAPARE - Hydrologist (Medical): No    Lack of Transportation (Non-Medical): No  Physical Activity: Not on file  Stress: Not on file  Social Connections: Not on file  Intimate Partner Violence: Not At Risk (03/29/2022)   Humiliation, Afraid, Rape, and Kick questionnaire    Fear of Current or Ex-Partner: No    Emotionally Abused: No    Physically Abused: No    Sexually Abused: No      PHYSICAL EXAM  Vitals:   07/22/22 1522  BP: 127/81  Pulse:  96  Weight: 235 lb 3.2 oz (106.7 kg)  Height: '6\' 2"'$  (1.88 m)     Body mass index is 30.2 kg/m.  Generalized: Well developed, in no acute distress  Cardiology: normal rate and rhythm, no murmur noted Respiratory: clear to auscultation bilaterally  Neurological examination  Mentation: Alert oriented to time, place, history taking. Follows all commands speech and language fluent Cranial nerve II-XII: Pupils were equal round reactive to light. Extraocular movements were full, visual field were full on confrontational test. Facial sensation and strength were normal. Head turning and shoulder shrug  were normal and symmetric. No nystagmus  Motor: The motor testing reveals 5 over 5 strength of all 4 extremities. Good symmetric motor tone is noted throughout.  Sensory: Sensory testing is intact to soft touch on all 4 extremities. No evidence of extinction is noted.  Gait and station: Gait is normal.     DIAGNOSTIC DATA (LABS, IMAGING, TESTING) - I reviewed patient records, labs, notes, testing and imaging myself where available.      No data to display           Lab Results  Component Value Date   WBC 5.6 05/01/2022   HGB 14.9 05/01/2022   HCT 40.5 05/01/2022   MCV 87.3 05/01/2022   PLT 188 05/01/2022      Component Value Date/Time   NA 140 05/12/2022 1227   NA 144 03/27/2020 1602   K 4.6 05/12/2022 1227   CL 102 05/12/2022 1227   CO2 31 05/12/2022  1227   GLUCOSE 86 05/12/2022 1227   BUN 12 05/12/2022 1227   BUN 13 03/27/2020 1602   CREATININE 0.78 05/12/2022 1227   CALCIUM 9.8 05/12/2022 1227   PROT 7.0 07/07/2022 0954   ALBUMIN 4.9 07/07/2022 0954   AST 21 07/07/2022 0954   ALT 16 07/07/2022 0954   ALKPHOS 93 07/07/2022 0954   BILITOT 0.3 07/07/2022 0954   GFRNONAA >60 04/30/2022 1531   GFRAA 93 03/27/2020 1602   Lab Results  Component Value Date   CHOL 174 07/07/2022   HDL 92 07/07/2022   LDLCALC 67 07/07/2022   LDLDIRECT 194.1 04/27/2009   TRIG 80  07/07/2022   CHOLHDL 1.9 07/07/2022   Lab Results  Component Value Date   HGBA1C 4.4 (L) 04/17/2022   No results found for: "VITAMINB12" Lab Results  Component Value Date   TSH 1.321 03/19/2022     ASSESSMENT AND PLAN 62 y.o. year old male  has a past medical history of Acute MI, inferior wall ([place]) (04/17/2022), Esophageal reflux, HFrEF (heart failure with reduced ejection fraction) ([place]) (04/19/2022), Hiatal hernia, Hyperlipidemia, Low back pain, S/P CABG x 3 (03/26/2022), Seizure disorder ([place]), Skin cancer, and Tremor. here with     ICD-10-CM   1. 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 Vitamin D deficiency  E55.9 Vitamin D, 25-hydroxy      Wayne Vang is doing well on phenytoin '200mg'$  in am and '300mg'$  in pm. We will continue current treatment plan. I will update labs, today. He will monitor episodes of dizziness and call immediately with any concerns of seizure activity.  Healthy lifestyle habits encouraged. He will follow up closely with PCP and cardiology. He will follow up annually. He verbalizes understanding and agreement with this plan.    Orders Placed This Encounter  Procedures   Phenytoin Level, Total   Vitamin D, 25-hydroxy   CMP      Meds ordered this encounter  Medications   phenytoin (DILANTIN) 100 MG ER capsule    Sig: Take 2 capsules (200 mg total) by mouth every morning AND 3 capsules (300 mg total) at bedtime. Take 2 capsules (200 mg) every morning and then take 3 capsules (300 mg) at bedtime.    Dispense:  450 capsule    Refill:  3    Order Specific Question:   Supervising Provider    Answer:   Melvenia Beam [id number]      I spent 30 minutes of face-to-face and non-face-to-face time with patient.  This included previsit chart review, lab review, study review, order entry, electronic health record documentation, patient education.   Debbora Presto, FNP-C 07/22/2022, 4:06 PM [facility] [address] [place], [place] [address] [phone]

## 2022-07-23 LAB — COMPREHENSIVE METABOLIC PANEL
ALT: 18 IU/L (ref 0–44)
AST: 26 IU/L (ref 0–40)
Albumin/Globulin Ratio: 2.3 — ABNORMAL HIGH (ref 1.2–2.2)
Albumin: 5 g/dL — ABNORMAL HIGH (ref 3.9–4.9)
Alkaline Phosphatase: 87 IU/L (ref 44–121)
BUN/Creatinine Ratio: 14 (ref 10–24)
BUN: 13 mg/dL (ref 8–27)
Bilirubin Total: 0.4 mg/dL (ref 0.0–1.2)
CO2: 22 mmol/L (ref 20–29)
Calcium: 9.6 mg/dL (ref 8.6–10.2)
Chloride: 103 mmol/L (ref 96–106)
Creatinine, Ser: 0.93 mg/dL (ref 0.76–1.27)
Globulin, Total: 2.2 g/dL (ref 1.5–4.5)
Glucose: 89 mg/dL (ref 70–99)
Potassium: 4 mmol/L (ref 3.5–5.2)
Sodium: 141 mmol/L (ref 134–144)
Total Protein: 7.2 g/dL (ref 6.0–8.5)
eGFR: 93 mL/min/{1.73_m2} (ref >59)

## 2022-07-23 LAB — PHENYTOIN LEVEL, TOTAL: Phenytoin (Dilantin), Serum: 25.3 ug/mL (ref 10.0–20.0)

## 2022-07-23 LAB — VITAMIN D 25 HYDROXY (VIT D DEFICIENCY, FRACTURES): Vit D, 25-Hydroxy: 43.6 ng/mL (ref 30.0–100.0)

## 2022-07-24 ENCOUNTER — Telehealth: Payer: Self-pay | Admitting: Cardiology

## 2022-07-24 NOTE — Telephone Encounter (Signed)
Patient's wife is calling wanting to know if the patient can take mucinex due to coughing up phlegm. Please advise.

## 2022-07-24 NOTE — Telephone Encounter (Signed)
Yes pt can take Mucinex

## 2022-07-24 NOTE — Telephone Encounter (Signed)
Spoke with pt's wife, Barnett Applebaum (ok per DPR) regarding him taking mucinex with his medications. Per Clinical pharmacist pt can take mucinex with his current medications. Wife verbalizes understanding.

## 2022-07-29 ENCOUNTER — Telehealth: Payer: Self-pay | Admitting: Family Medicine

## 2022-07-29 MED ORDER — PHENYTOIN SODIUM EXTENDED 100 MG PO CAPS: ORAL_CAPSULE | ORAL | 3 refills | Status: DC

## 2022-07-29 NOTE — Telephone Encounter (Signed)
Pt's wife, Nicholus Chandran said phenytoin (DILANTIN) 100 MG ER capsule was sent to the wrong pharmacy. Asking to be resent to [facility]. Asking if can do as soon as possible.

## 2022-07-29 NOTE — Telephone Encounter (Signed)
I called patient. I spoke with patient's wife, Wayne Vang, per DPR. I advised her that the phenytoin RX has been sent to [facility]. Patient's wife verbalized understanding.

## 2022-07-31 ENCOUNTER — Encounter: Payer: Self-pay | Admitting: Internal Medicine

## 2022-07-31 ENCOUNTER — Ambulatory Visit: Payer: BC Managed Care – PPO | Admitting: Internal Medicine

## 2022-07-31 VITALS — BP 118/68 | HR 77 | Temp 97.8°F | Ht 74.0 in | Wt 237.0 lb

## 2022-07-31 DIAGNOSIS — J0140 Acute pansinusitis, unspecified: Secondary | ICD-10-CM | POA: Insufficient documentation

## 2022-07-31 MED ORDER — AMOXICILLIN-POT CLAVULANATE 875-125 MG PO TABS: 1.0000 | ORAL_TABLET | Freq: Two times a day (BID) | ORAL | 0 refills | Status: DC

## 2022-07-31 NOTE — Progress Notes (Signed)
Subjective:    Patient ID: Wayne Vang, male    DOB: June 13, 1961, 62 y.o.   MRN: [id number]  HPI Here due to respiratory illness  Started almost a week ago-- itchy throat Then drainage/phlegm in throat Cough--with green sputum Sinus congestion and headache Using mucinex--breaking it up Fever once early on (99.8 or so) Did have some chills and some shakes Some sweats at night No ear pain No SOB --other than the nasal congestion (using nasal spray)  Tried another OTC med and taking tylenol  Tried to go back to work--but was too dizzy  Current Outpatient Medications on File Prior to Visit  Medication Sig Dispense Refill   acetaminophen (TYLENOL) 500 MG tablet Take 1,000 mg by mouth daily as needed for mild pain or headache.     aspirin EC 81 MG tablet Take 1 tablet (81 mg total) by mouth daily. Swallow whole. 30 tablet    Cholecalciferol (VITAMIN D-3 PO) Take 5,000 Units by mouth daily.     ezetimibe (ZETIA) 10 MG tablet Take 1 tablet (10 mg total) by mouth daily. 90 tablet 3   isosorbide mononitrate (IMDUR) 30 MG 24 hr tablet Take 1 tablet (30 mg total) by mouth daily. 90 tablet 3   lansoprazole (PREVACID) 30 MG capsule TAKE 1 CAPSULE DAILY BEFORE BREAKFAST 90 capsule 4   metoprolol succinate (TOPROL-XL) 25 MG 24 hr tablet Take 1 tablet (25 mg total) by mouth daily. 90 tablet 2   nitroGLYCERIN (NITROSTAT) 0.4 MG SL tablet Place 1 tablet (0.4 mg total) under the tongue every 5 (five) minutes as needed for chest pain. 25 tablet 2   phenytoin (DILANTIN) 100 MG ER capsule Take 2 capsules (200 mg total) by mouth every morning AND 3 capsules (300 mg total) at bedtime. Take 2 capsules (200 mg) every morning and then take 3 capsules (300 mg) at bedtime. 450 capsule 3   prasugrel (EFFIENT) 10 MG TABS tablet Take 1 tablet (10 mg total) by mouth daily. 90 tablet 3   rosuvastatin (CRESTOR) 40 MG tablet Take 1 tablet (40 mg total) by mouth daily. 90 tablet 1   No current  facility-administered medications on file prior to visit.    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   Seizure disorder ([place])    Skin cancer    "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"   Tremor     Past Surgical History:  Procedure Laterality Date   COLONOSCOPY     CORONARY ARTERY BYPASS GRAFT N/A 03/26/2022   Procedure: CORONARY ARTERY BYPASS GRAFTING (CABG) X THREE, USING LEFT INTERNAL MAMMARY ARTERY AND RIGHT LEG GREATER SAPHENOUS VEIN;  Surgeon: [name], MD;  Location: [facility];  Service: Open Heart Surgery;  Laterality: N/A;   CORONARY BALLOON ANGIOPLASTY N/A 04/17/2022   Procedure: CORONARY BALLOON ANGIOPLASTY;  Surgeon: Jettie Booze, MD;  Location: [facility];  Service: Cardiovascular;  Laterality: N/A;   INGUINAL HERNIA REPAIR Right 12/06/2013  Procedure:  REPAIR RIGHT INGUINAL HERNIA;  Surgeon: Joyice Faster. Cornett, MD;  Location: [facility];  Service: General;  Laterality: Right;   INGUINAL HERNIA REPAIR Left 07/21/2013   INSERTION OF MESH N/A 12/06/2013   Procedure: INSERTION OF MESH;  Surgeon: Joyice Faster. Cornett, MD;  Location: [facility];  Service: General;  Laterality: N/A;   LEFT HEART CATH AND CORONARY ANGIOGRAPHY N/A 03/20/2022   Procedure: LEFT HEART CATH AND CORONARY ANGIOGRAPHY;  Surgeon: Jettie Booze, MD;  Location: [facility];  Service: Cardiovascular;  Laterality: N/A;   LEFT HEART CATH AND CORONARY ANGIOGRAPHY N/A 04/17/2022   Procedure: LEFT HEART CATH AND  CORONARY ANGIOGRAPHY;  Surgeon: Jettie Booze, MD;  Location: [facility];  Service: Cardiovascular;  Laterality: N/A;   LEFT HEART CATH AND CORS/GRAFTS ANGIOGRAPHY N/A 04/17/2022   Procedure: LEFT HEART CATH AND CORS/GRAFTS ANGIOGRAPHY;  Surgeon: Jettie Booze, MD;  Location: [facility];  Service: Cardiovascular;  Laterality: N/A;   LEFT HEART CATH AND CORS/GRAFTS ANGIOGRAPHY N/A 05/01/2022   Procedure: LEFT HEART CATH AND CORS/GRAFTS ANGIOGRAPHY;  Surgeon: Jettie Booze, MD;  Location: [facility];  Service: Cardiovascular;  Laterality: N/A;   POLYPECTOMY     TEE WITHOUT CARDIOVERSION N/A 03/26/2022   Procedure: TRANSESOPHAGEAL ECHOCARDIOGRAM (TEE);  Surgeon: [name], MD;  Location: [facility];  Service: Open Heart Surgery;  Laterality: N/A;    Family History  Problem Relation Age of Onset   Alzheimer's disease Mother    Cancer Father    Colon polyps Sister    Stroke Sister    Alcohol abuse Brother    Colon cancer Neg Hx    Esophageal cancer Neg Hx    Stomach cancer Neg Hx    Rectal cancer Neg Hx     Social History   Socioeconomic History   Marital status: Married    Spouse name: Not on file   Number of children: 1   Years of education: Not on file   Highest education level: Not on file  Occupational History   Occupation: Shop Printmaker: [employer]    Comment: [employer]  Tobacco Use   Smoking status: Former    Types: Cigarettes    Quit date: 07/21/2002    Years since quitting: 20.0    Passive exposure: Never   Smokeless tobacco: Never  Vaping Use   Vaping Use: Never used  Substance and Sexual Activity   Alcohol use: Yes    Comment: socially   Drug use: No   Sexual activity: Not on file  Other Topics Concern   Not on file  Social History Narrative      He has been married 31+ years.   Caffeine Use: He drinks coke, tea, and coffee.   Truck Dealer   Social Determinants of Health    Financial Resource Strain: Not on file  Food Insecurity: No Food Insecurity (03/29/2022)   Hunger Vital Sign    Worried About Running Out of Food in the Last Year: Never true    [place] in the Last Year: Never true  Transportation Needs: No Transportation Needs (03/29/2022)   PRAPARE - Hydrologist (Medical): No    Lack of Transportation (Non-Medical): No  Physical Activity: Not on file  Stress: Not on file  Social Connections: Not on file  Intimate Partner Violence: Not At Risk (03/29/2022)  Humiliation, Afraid, Rape, and Kick questionnaire    Fear of Current or Ex-Partner: No    Emotionally Abused: No    Physically Abused: No    Sexually Abused: No   Review of Systems No N/V Appetite is off--but able to eat    Objective:   Physical Exam Constitutional:      Appearance: Normal appearance.  HENT:     Head:     Comments: No sinus tenderness    Right Ear: Tympanic membrane and ear canal normal.     Left Ear: Tympanic membrane and ear canal normal.     Nose: Congestion present.     Mouth/Throat:     Pharynx: No oropharyngeal exudate or posterior oropharyngeal erythema.  Pulmonary:     Effort: Pulmonary effort is normal.     Breath sounds: Normal breath sounds. No wheezing or rales.  Musculoskeletal:     Cervical back: Neck supple.  Lymphadenopathy:     Cervical: No cervical adenopathy.  Neurological:     Mental Status: He is alert.            Assessment & Plan:

## 2022-07-31 NOTE — Assessment & Plan Note (Signed)
Going on a week and persistent symptoms Recent CABG Will give augmentin 875 bid x 7 days  Tylenol, OTC DM cough meds prn

## 2022-08-04 ENCOUNTER — Ambulatory Visit: Payer: BC Managed Care – PPO

## 2022-10-12 NOTE — Progress Notes (Unsigned)
Cardiology Office Note:    Date:  10/14/2022   ID:  Wayne Vang, DOB 09/02/1960, MRN [id number]  PCP:  Mckeel Loffler, MD  Cardiologist:  Donato Heinz, MD  Electrophysiologist:  None   Referring MD: Stansel Loffler, MD   Chief Complaint  Patient presents with   Coronary Artery Disease    History of Present Illness:    Wayne Vang is a 62 y.o. male with a hx of CAD status post CABG 03/2022, seizures, hyperlipidemia who presents for follow-up.  Patient underwent CABG x 3 with LIMA-LAD, sequential SVG to ramus and OM on 03/26/2022.  Echocardiogram showed EF 60 to 65%.  He presented with chest pain on 04/17/2022 and was found to have inferior STEMI.  Had V-fib arrest in ED, ROSC obtained after 1 shock.  Cath showed 99% stenosis mid OM 2 insertion site of SVG, underwent successful angioplasty.  Echo showed EF 45 to 50% antiplatelet therapy complicated by phenytoin use for seizures, he was switched from Plavix to Effient.  He was admitted with chest pain again 04/30/2022, underwent cath which showed patent bypass grafts.  Since last clinic visit, he reports he has been doing okay.  Does report has been having some chest tightness.  Recent occurred when doing yard work.  Last for about 5 minutes, resolves with rest.  Has not had to use any nitroglycerin.  Does report he has been walking 4-5 times per week for 1 mile, denies any chest pain or dyspnea with this.  No lower extremity edema.  Some lightheadedness but no syncope.  He is taking DAPT, denies any bleeding issues.   Past Medical History:  Diagnosis Date   Acute MI, inferior wall ([place]) 04/17/2022   Esophageal reflux    HFrEF (heart failure with reduced ejection fraction) ([place]) 04/19/2022   Hiatal hernia    Hyperlipidemia    Low back pain    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   Seizure disorder ([place])    Skin cancer    "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"   Tremor     Past Surgical History:  Procedure Laterality Date   COLONOSCOPY      CORONARY ARTERY BYPASS GRAFT N/A 03/26/2022   Procedure: CORONARY ARTERY BYPASS GRAFTING (CABG) X THREE, USING LEFT INTERNAL MAMMARY ARTERY AND RIGHT LEG GREATER SAPHENOUS VEIN;  Surgeon: [name], MD;  Location: [facility];  Service: Open Heart Surgery;  Laterality: N/A;   CORONARY BALLOON ANGIOPLASTY N/A 04/17/2022   Procedure: CORONARY BALLOON ANGIOPLASTY;  Surgeon: Jettie Booze, MD;  Location: [place] [facility];  Service: Cardiovascular;  Laterality: N/A;   INGUINAL HERNIA REPAIR Right 12/06/2013   Procedure:  REPAIR RIGHT INGUINAL HERNIA;  Surgeon: Joyice Faster. Cornett, MD;  Location: [facility];  Service: General;  Laterality: Right;   INGUINAL HERNIA REPAIR Left 07/21/2013   INSERTION OF MESH N/A 12/06/2013   Procedure: INSERTION OF MESH;  Surgeon: Joyice Faster. Cornett, MD;  Location: [place];  Service: General;  Laterality: N/A;   LEFT HEART CATH AND CORONARY ANGIOGRAPHY N/A 03/20/2022   Procedure: LEFT HEART CATH AND CORONARY ANGIOGRAPHY;  Surgeon: Jettie Booze, MD;  Location: [facility];  Service: Cardiovascular;  Laterality: N/A;   LEFT HEART CATH AND CORONARY ANGIOGRAPHY N/A 04/17/2022   Procedure: LEFT HEART CATH AND CORONARY ANGIOGRAPHY;  Surgeon: Jettie Booze, MD;  Location: [facility] [facility];  Service: Cardiovascular;  Laterality: N/A;   LEFT HEART CATH AND CORS/GRAFTS ANGIOGRAPHY  N/A 04/17/2022   Procedure: LEFT HEART CATH AND CORS/GRAFTS ANGIOGRAPHY;  Surgeon: Jettie Booze, MD;  Location: [place] [facility];  Service: Cardiovascular;  Laterality: N/A;   LEFT HEART CATH AND CORS/GRAFTS ANGIOGRAPHY N/A 05/01/2022   Procedure: LEFT HEART CATH AND CORS/GRAFTS ANGIOGRAPHY;  Surgeon: Jettie Booze, MD;  Location: [place] [facility];  Service: Cardiovascular;  Laterality: N/A;   POLYPECTOMY     TEE WITHOUT CARDIOVERSION N/A 03/26/2022   Procedure: TRANSESOPHAGEAL ECHOCARDIOGRAM (TEE);  Surgeon: [name], MD;   Location: [facility];  Service: Open Heart Surgery;  Laterality: N/A;    Current Medications: Current Meds  Medication Sig   acetaminophen (TYLENOL) 500 MG tablet Take 1,000 mg by mouth daily as needed for mild pain or headache.   aspirin EC 81 MG tablet Take 1 tablet (81 mg total) by mouth daily. Swallow whole.   Cholecalciferol (VITAMIN D-3 PO) Take 5,000 Units by mouth daily.   ezetimibe (ZETIA) 10 MG tablet Take 1 tablet (10 mg total) by mouth daily.   isosorbide mononitrate (IMDUR) 30 MG 24 hr tablet Take 1 tablet (30 mg total) by mouth daily.   lansoprazole (PREVACID) 30 MG capsule TAKE 1 CAPSULE DAILY BEFORE BREAKFAST   metoprolol succinate (TOPROL-XL) 25 MG 24 hr tablet Take 1 tablet (25 mg total) by mouth daily.   phenytoin (DILANTIN) 100 MG ER capsule Take 2 capsules (200 mg total) by mouth every morning AND 3 capsules (300 mg total) at bedtime. Take 2 capsules (200 mg) every morning and then take 3 capsules (300 mg) at bedtime.   prasugrel (EFFIENT) 10 MG TABS tablet Take 1 tablet (10 mg total) by mouth daily.   rosuvastatin (CRESTOR) 40 MG tablet Take 1 tablet (40 mg total) by mouth daily.   [DISCONTINUED] nitroGLYCERIN (NITROSTAT) 0.4 MG SL tablet Place 1 tablet (0.4 mg total) under the tongue every 5 (five) minutes as needed for chest pain.     Allergies:   Nexium [esomeprazole], Zocor [simvastatin], and Gaviscon [alum hydroxide-mag carbonate]   Social History   Socioeconomic History   Marital status: Married    Spouse name: Not on file   Number of children: 1   Years of education: Not on file   Highest education level: Not on file  Occupational History   Occupation: Shop Printmaker: [employer]    Comment: [employer]  Tobacco Use   Smoking status: Former    Types: Cigarettes    Quit date: 07/21/2002    Years since quitting: 20.2    Passive exposure: Never   Smokeless tobacco: Never  Vaping Use   Vaping Use: Never used  Substance and  Sexual Activity   Alcohol use: Yes    Comment: socially   Drug use: No   Sexual activity: Not on file  Other Topics Concern   Not on file  Social History Narrative      He has been married 31+ years.   Caffeine Use: He drinks coke, tea, and coffee.   Truck Dealer   Social Determinants of Health   Financial Resource Strain: Not on file  Food Insecurity: No Food Insecurity (03/29/2022)   Hunger Vital Sign    Worried About Running Out of Food in the Last Year: Never true    [place] in the Last Year: Never true  Transportation Needs: No Transportation Needs (03/29/2022)   PRAPARE - Hydrologist (Medical): No  Lack of Transportation (Non-Medical): No  Physical Activity: Not on file  Stress: Not on file  Social Connections: Not on file     Family History: The patient's family history includes Alcohol abuse in his brother; Alzheimer's disease in his mother; Cancer in his father; Colon polyps in his sister; Stroke in his sister. There is no history of Colon cancer, Esophageal cancer, Stomach cancer, or Rectal cancer.  ROS:   Please see the history of present illness.     All other systems reviewed and are negative.  EKGs/Labs/Other Studies Reviewed:    The following studies were reviewed today:  EKG:   10/14/2022: Normal sinus rhythm, rate 69, no ST abnormalities, poor R wave progression  Recent Labs: 03/19/2022: TSH 1.321 03/27/2022: Magnesium 2.2 05/01/2022: Hemoglobin 14.9; Platelets 188 07/22/2022: ALT 18; BUN 13; Creatinine, Ser 0.93; Potassium 4.0; Sodium 141  Recent Lipid Panel    Component Value Date/Time   CHOL 174 07/07/2022 0954   TRIG 80 07/07/2022 0954   HDL 92 07/07/2022 0954   CHOLHDL 1.9 07/07/2022 0954   CHOLHDL 2.1 04/17/2022 1238   VLDL 25 04/17/2022 1238   LDLCALC 67 07/07/2022 0954   LDLDIRECT 194.1 04/27/2009 0901    Physical Exam:    VS:  BP 114/70 (BP Location: Left Arm, Patient Position: Sitting, Cuff Size:  Large)   Pulse 69   Ht 6\' 3"  (1.905 m)   Wt 238 lb (108 kg)   SpO2 97%   BMI 29.75 kg/m     Wt Readings from Last 3 Encounters:  10/14/22 238 lb (108 kg)  07/31/22 237 lb (107.5 kg)  07/22/22 235 lb 3.2 oz (106.7 kg)     GEN:  Well nourished, well developed in no acute distress HEENT: Normal NECK: No JVD; No carotid bruits CARDIAC: RRR, no murmurs, rubs, gallops RESPIRATORY:  Clear to auscultation without rales, wheezing or rhonchi  ABDOMEN: Soft, non-tender, non-distended MUSCULOSKELETAL:  No edema; No deformity  SKIN: Warm and dry NEUROLOGIC:  Alert and oriented x 3 PSYCHIATRIC:  Normal affect   ASSESSMENT:    1. CAD S/P percutaneous coronary angioplasty   2. S/P CABG x 3   3. Acute systolic heart failure ([place])   4. Hyperlipidemia, unspecified hyperlipidemia type   5. Chest pain of uncertain etiology   6. Cardiac arrest [facility])    PLAN:    CAD: underwent CABG x 3 with LIMA-LAD, sequential SVG to ramus and OM on 03/26/2022.  Echocardiogram showed EF 60 to 65%.  He presented with chest pain on 04/17/2022 and was found to have inferior STEMI.  Had V-fib arrest in ED, ROSC obtained after 1 shock.  Cath showed 99% stenosis mid OM 2 insertion site of SVG, underwent successful angioplasty.  Echo showed EF 45 to 50% antiplatelet therapy complicated by phenytoin use for seizures, he was switched from Plavix to Effient.  He was admitted with chest pain again 04/30/2022, underwent cath which showed patent bypass grafts. -Continue aspirin and Effient.  Not on Brilinta due to interaction with phenytoin -Continue rosuvastatin 40 mg daily and Zetia 10 mg daily -Continue Toprol-XL 25 mg daily -Continue Imdur 30 mg daily -As needed sublingual nitroglycerin -Reports recent chest pain.  Recommend stress PET for further evaluation  VF arrest: On 04/09/2022 in setting of inferior STEMI.  ROSC obtained after 1 shock.  No significant ventricular arrhythmias following revascularization.  Continue  Toprol-XL 25 mg daily  Acute systolic heart failure: EF 45 to 50% on echocardiogram 03/2022. -Continue Toprol-XL 25  mg daily -Has not been on ACE/ARB/Arni due to soft BP -Check echo to evaluate systolic function.  If EF remains reduced, would trial low-dose losartan  Hyperlipidemia: LDL 84 on 05/19/2022, on rosuvastatin 40 mg daily.  Zetia 10 mg daily added.  Check lipid panel  Seizures: On phenytoin  RTC in 3 months   Shared Decision Making/Informed Consent The risks [chest pain, shortness of breath, cardiac arrhythmias, dizziness, blood pressure fluctuations, myocardial infarction, stroke/transient ischemic attack, nausea, vomiting, allergic reaction, radiation exposure, metallic taste sensation and life-threatening complications (estimated to be 1 in 10,000)], benefits (risk stratification, diagnosing coronary artery disease, treatment guidance) and alternatives of a cardiac PET stress test were discussed in detail with Mr. Polan and he agrees to proceed.   Medication Adjustments/Labs and Tests Ordered: Current medicines are reviewed at length with the patient today.  Concerns regarding medicines are outlined above.  Orders Placed This Encounter  Procedures   NM PET CT CARDIAC PERFUSION MULTI W/ABSOLUTE BLOODFLOW   Basic metabolic panel   Lipid panel   CBC   EKG 12-Lead   ECHOCARDIOGRAM COMPLETE   Meds ordered this encounter  Medications   nitroGLYCERIN (NITROSTAT) 0.4 MG SL tablet    Sig: Place 1 tablet (0.4 mg total) under the tongue every 5 (five) minutes as needed for chest pain.    Dispense:  25 tablet    Refill:  3    Patient Instructions  Medication Instructions:  Your physician recommends that you continue on your current medications as directed. Please refer to the Current Medication list given to you today.  *If you need a refill on your cardiac medications before your next appointment, please call your pharmacy*  Lab Work: BMET, CBC, Lipid today  If you have  labs (blood work) drawn today and your tests are completely normal, you will receive your results only by: [place] (if you have MyChart) OR A paper copy in the mail If you have any lab test that is abnormal or we need to change your treatment, we will call you to review the results.   Testing/Procedures: CARDIAC PET- Your physician has requested that you have a Cardiac Pet Stress Test. This testing is completed at [facility] ([place], [place] [address]). The schedulers will call you to get this scheduled. Please follow instructions below and call the office with any questions/concerns [phone]).  Your physician has requested that you have an echocardiogram. Echocardiography is a painless test that uses sound waves to create images of your heart. It provides your doctor with information about the size and shape of your heart and how well your heart's chambers and valves are working. This procedure takes approximately one hour. There are no restrictions for this procedure. Please do NOT wear cologne, perfume, aftershave, or lotions (deodorant is allowed). Please arrive 15 minutes prior to your appointment time.  Follow-Up: At [facility], you and your health needs are our priority.  As part of our continuing mission to provide you with exceptional heart care, we have created designated Provider Care Teams.  These Care Teams include your primary Cardiologist (physician) and Advanced Practice Providers (APPs -  Physician Assistants and Nurse Practitioners) who all work together to provide you with the care you need, when you need it.  We recommend signing up for the patient portal called "MyChart".  Sign up information is provided on this After Visit Summary.  MyChart is used to connect with patients for Virtual Visits (Telemedicine).  Patients are able to view lab/test results, encounter notes, upcoming appointments, etc.  Non-urgent messages  can be sent to your provider as well.   To learn more about what you can do with MyChart, go to [email or web address].    Your next appointment:   3 month(s)  Provider:   Donato Heinz, MD     Other Instructions How to Prepare for Your Cardiac PET/CT Stress Test:  1. Please do not take these medications before your test:   Medications that may interfere with the cardiac pharmacological stress agent (ex. nitrates - including erectile dysfunction medications, isosorbide mononitrate, tamulosin or beta-blockers) the day of the exam. (Erectile dysfunction medication should be held for at least 72 hrs prior to test) Theophylline containing medications for 12 hours. Dipyridamole 48 hours prior to the test. Your remaining medications may be taken with water.  2. Nothing to eat or drink, except water, 3 hours prior to arrival time.   NO caffeine/decaffeinated products, or chocolate 12 hours prior to arrival.  3. NO perfume, cologne or lotion  4. Total time is 1 to 2 hours; you may want to bring reading material for the waiting time.  5. Please report to Radiology at the [facility] Main Entrance 30 minutes early for your test.  [place], [place] [address]  Diabetic Preparation:  Hold oral medications. You may take NPH and Lantus insulin. Do not take Humalog or Humulin R (Regular Insulin) the day of your test. Check blood sugars prior to leaving the house. If able to eat breakfast prior to 3 hour fasting, you may take all medications, including your insulin, Do not worry if you miss your breakfast dose of insulin - start at your next meal.  IF YOU THINK YOU MAY BE PREGNANT, OR ARE NURSING PLEASE INFORM THE TECHNOLOGIST.  In preparation for your appointment, medication and supplies will be purchased.  Appointment availability is limited, so if you need to cancel or reschedule, please call the Radiology Department at [phone]  24 hours in advance  to avoid a cancellation fee of $100.00  What to Expect After you Arrive:  Once you arrive and check in for your appointment, you will be taken to a preparation room within the Radiology Department.  A technologist or Nurse will obtain your medical history, verify that you are correctly prepped for the exam, and explain the procedure.  Afterwards,  an IV will be started in your arm and electrodes will be placed on your skin for EKG monitoring during the stress portion of the exam. Then you will be escorted to the PET/CT scanner.  There, staff will get you positioned on the scanner and obtain a blood pressure and EKG.  During the exam, you will continue to be connected to the EKG and blood pressure machines.  A small, safe amount of a radioactive tracer will be injected in your IV to obtain a series of pictures of your heart along with an injection of a stress agent.    After your Exam:  It is recommended that you eat a meal and drink a caffeinated beverage to counter act any effects of the stress agent.  Drink plenty of fluids for the remainder of the day and urinate frequently for the first couple of hours after the exam.  Your doctor will inform you of your test results within 7-10 business days.  For questions about your test or how to prepare for your test, please call: Marchia Bond, Cardiac  Imaging Nurse Navigator  Gordy Clement, Cardiac Imaging Nurse Navigator Office: [phone]     Signed, Donato Heinz, MD  10/14/2022 8:34 AM    [place]

## 2022-10-14 ENCOUNTER — Ambulatory Visit: Payer: BC Managed Care – PPO | Attending: Cardiology | Admitting: Cardiology

## 2022-10-14 ENCOUNTER — Encounter: Payer: Self-pay | Admitting: Cardiology

## 2022-10-14 VITALS — BP 114/70 | HR 69 | Ht 75.0 in | Wt 238.0 lb

## 2022-10-14 DIAGNOSIS — I2510 Atherosclerotic heart disease of native coronary artery without angina pectoris: Secondary | ICD-10-CM | POA: Diagnosis not present

## 2022-10-14 DIAGNOSIS — E785 Hyperlipidemia, unspecified: Secondary | ICD-10-CM

## 2022-10-14 DIAGNOSIS — R079 Chest pain, unspecified: Secondary | ICD-10-CM

## 2022-10-14 DIAGNOSIS — I5021 Acute systolic (congestive) heart failure: Secondary | ICD-10-CM

## 2022-10-14 DIAGNOSIS — Z951 Presence of aortocoronary bypass graft: Secondary | ICD-10-CM | POA: Diagnosis not present

## 2022-10-14 DIAGNOSIS — Z9861 Coronary angioplasty status: Secondary | ICD-10-CM

## 2022-10-14 DIAGNOSIS — I469 Cardiac arrest, cause unspecified: Secondary | ICD-10-CM

## 2022-10-14 LAB — LIPID PANEL

## 2022-10-14 MED ORDER — NITROGLYCERIN 0.4 MG SL SUBL: 0.4000 mg | SUBLINGUAL_TABLET | SUBLINGUAL | 3 refills | Status: AC | PRN

## 2022-10-14 NOTE — Patient Instructions (Signed)
Medication Instructions:  Your physician recommends that you continue on your current medications as directed. Please refer to the Current Medication list given to you today.  *If you need a refill on your cardiac medications before your next appointment, please call your pharmacy*  Lab Work: BMET, CBC, Lipid today  If you have labs (blood work) drawn today and your tests are completely normal, you will receive your results only by: [place] (if you have MyChart) OR A paper copy in the mail If you have any lab test that is abnormal or we need to change your treatment, we will call you to review the results.   Testing/Procedures: CARDIAC PET- Your physician has requested that you have a Cardiac Pet Stress Test. This testing is completed at [facility] ([place], [place] [address]). The schedulers will call you to get this scheduled. Please follow instructions below and call the office with any questions/concerns [phone]).  Your physician has requested that you have an echocardiogram. Echocardiography is a painless test that uses sound waves to create images of your heart. It provides your doctor with information about the size and shape of your heart and how well your heart's chambers and valves are working. This procedure takes approximately one hour. There are no restrictions for this procedure. Please do NOT wear cologne, perfume, aftershave, or lotions (deodorant is allowed). Please arrive 15 minutes prior to your appointment time.  Follow-Up: At [facility], you and your health needs are our priority.  As part of our continuing mission to provide you with exceptional heart care, we have created designated Provider Care Teams.  These Care Teams include your primary Cardiologist (physician) and Advanced Practice Providers (APPs -  Physician Assistants and Nurse Practitioners) who all work together to provide you with the care you  need, when you need it.  We recommend signing up for the patient portal called "MyChart".  Sign up information is provided on this After Visit Summary.  MyChart is used to connect with patients for Virtual Visits (Telemedicine).  Patients are able to view lab/test results, encounter notes, upcoming appointments, etc.  Non-urgent messages can be sent to your provider as well.   To learn more about what you can do with MyChart, go to [email or web address].    Your next appointment:   3 month(s)  Provider:   Donato Heinz, MD     Other Instructions How to Prepare for Your Cardiac PET/CT Stress Test:  1. Please do not take these medications before your test:   Medications that may interfere with the cardiac pharmacological stress agent (ex. nitrates - including erectile dysfunction medications, isosorbide mononitrate, tamulosin or beta-blockers) the day of the exam. (Erectile dysfunction medication should be held for at least 72 hrs prior to test) Theophylline containing medications for 12 hours. Dipyridamole 48 hours prior to the test. Your remaining medications may be taken with water.  2. Nothing to eat or drink, except water, 3 hours prior to arrival time.   NO caffeine/decaffeinated products, or chocolate 12 hours prior to arrival.  3. NO perfume, cologne or lotion  4. Total time is 1 to 2 hours; you may want to bring reading material for the waiting time.  5. Please report to Radiology at the [facility] Main Entrance 30 minutes early for your test.  [place], [place] [address]  Diabetic Preparation:  Hold oral medications. You may take NPH and Lantus insulin. Do not take  Humalog or Humulin R (Regular Insulin) the day of your test. Check blood sugars prior to leaving the house. If able to eat breakfast prior to 3 hour fasting, you may take all medications, including your insulin, Do not worry if you miss your breakfast dose of insulin -  start at your next meal.  IF YOU THINK YOU MAY BE PREGNANT, OR ARE NURSING PLEASE INFORM THE TECHNOLOGIST.  In preparation for your appointment, medication and supplies will be purchased.  Appointment availability is limited, so if you need to cancel or reschedule, please call the Radiology Department at [phone]  24 hours in advance to avoid a cancellation fee of $100.00  What to Expect After you Arrive:  Once you arrive and check in for your appointment, you will be taken to a preparation room within the Radiology Department.  A technologist or Nurse will obtain your medical history, verify that you are correctly prepped for the exam, and explain the procedure.  Afterwards,  an IV will be started in your arm and electrodes will be placed on your skin for EKG monitoring during the stress portion of the exam. Then you will be escorted to the PET/CT scanner.  There, staff will get you positioned on the scanner and obtain a blood pressure and EKG.  During the exam, you will continue to be connected to the EKG and blood pressure machines.  A small, safe amount of a radioactive tracer will be injected in your IV to obtain a series of pictures of your heart along with an injection of a stress agent.    After your Exam:  It is recommended that you eat a meal and drink a caffeinated beverage to counter act any effects of the stress agent.  Drink plenty of fluids for the remainder of the day and urinate frequently for the first couple of hours after the exam.  Your doctor will inform you of your test results within 7-10 business days.  For questions about your test or how to prepare for your test, please call: Marchia Bond, Cardiac Imaging Nurse Navigator  Gordy Clement, Cardiac Imaging Nurse Navigator Office: [phone]

## 2022-10-15 LAB — BASIC METABOLIC PANEL
BUN/Creatinine Ratio: 12 (ref 10–24)
BUN: 11 mg/dL (ref 8–27)
CO2: 23 mmol/L (ref 20–29)
Calcium: 9.7 mg/dL (ref 8.6–10.2)
Chloride: 103 mmol/L (ref 96–106)
Creatinine, Ser: 0.95 mg/dL (ref 0.76–1.27)
Glucose: 95 mg/dL (ref 70–99)
Potassium: 4.8 mmol/L (ref 3.5–5.2)
Sodium: 143 mmol/L (ref 134–144)
eGFR: 91 mL/min/{1.73_m2} (ref >59)

## 2022-10-15 LAB — CBC
Hematocrit: 43.4 % (ref 37.5–51.0)
Hemoglobin: 15.2 g/dL (ref 13.0–17.7)
MCH: 30.8 pg (ref 26.6–33.0)
MCHC: 35 g/dL (ref 31.5–35.7)
MCV: 88 fL (ref 79–97)
Platelets: 199 10*3/uL (ref 150–450)
RBC: 4.93 x10E6/uL (ref 4.14–5.80)
RDW: 12.8 % (ref 11.6–15.4)
WBC: 6.8 10*3/uL (ref 3.4–10.8)

## 2022-10-15 LAB — LIPID PANEL
Chol/HDL Ratio: 2 ratio (ref 0.0–5.0)
Cholesterol, Total: 168 mg/dL (ref 100–199)
HDL: 86 mg/dL (ref >39)
LDL Chol Calc (NIH): 69 mg/dL (ref 0–99)
Triglycerides: 70 mg/dL (ref 0–149)
VLDL Cholesterol Cal: 13 mg/dL (ref 5–40)

## 2022-10-23 ENCOUNTER — Telehealth: Payer: Self-pay | Admitting: Cardiology

## 2022-10-23 ENCOUNTER — Other Ambulatory Visit: Payer: Self-pay | Admitting: Cardiology

## 2022-10-23 MED ORDER — ROSUVASTATIN CALCIUM 40 MG PO TABS: 40.0000 mg | ORAL_TABLET | Freq: Every day | ORAL | 3 refills | Status: DC

## 2022-10-23 NOTE — Telephone Encounter (Signed)
Refill sent.

## 2022-10-23 NOTE — Telephone Encounter (Signed)
*  STAT* If patient is at the pharmacy, call can be transferred to refill team.   1. Which medications need to be refilled? (please list name of each medication and dose if known) rosuvastatin (CRESTOR) 40 MG tablet     2. Which pharmacy/location (including street and city if local pharmacy) is medication to be sent to? [id number] [place],  [address]   3. Do they need a 30 day or 90 day supply? [place]

## 2022-11-10 ENCOUNTER — Ambulatory Visit (HOSPITAL_COMMUNITY): Payer: BC Managed Care – PPO | Attending: Internal Medicine

## 2022-11-10 DIAGNOSIS — I5021 Acute systolic (congestive) heart failure: Secondary | ICD-10-CM | POA: Diagnosis not present

## 2023-01-14 ENCOUNTER — Ambulatory Visit: Payer: BC Managed Care – PPO | Attending: Cardiology | Admitting: Cardiology

## 2023-01-14 ENCOUNTER — Encounter: Payer: Self-pay | Admitting: Cardiology

## 2023-01-14 VITALS — BP 120/80 | HR 75 | Ht 74.0 in | Wt 224.6 lb

## 2023-01-14 DIAGNOSIS — I469 Cardiac arrest, cause unspecified: Secondary | ICD-10-CM | POA: Diagnosis not present

## 2023-01-14 DIAGNOSIS — E785 Hyperlipidemia, unspecified: Secondary | ICD-10-CM

## 2023-01-14 DIAGNOSIS — I2510 Atherosclerotic heart disease of native coronary artery without angina pectoris: Secondary | ICD-10-CM | POA: Diagnosis not present

## 2023-01-14 DIAGNOSIS — I5021 Acute systolic (congestive) heart failure: Secondary | ICD-10-CM | POA: Diagnosis not present

## 2023-01-14 DIAGNOSIS — Z9861 Coronary angioplasty status: Secondary | ICD-10-CM

## 2023-01-14 NOTE — Progress Notes (Signed)
Cardiology Office Note:    Date:  01/14/2023   ID:  Wayne Vang, DOB [birth year], MRN [id number]  PCP:  Hannah Beat, MD  Cardiologist:  Little Ishikawa, MD  Electrophysiologist:  None   Referring MD: Hannah Beat, MD   Chief Complaint  Patient presents with   Coronary Artery Disease    History of Present Illness:    Wayne YERGER is a 62 y.o. male with a hx of CAD status post CABG 03/2022, seizures, hyperlipidemia who presents for follow-up.  Patient underwent CABG x 3 with LIMA-LAD, sequential SVG to ramus and OM on 03/26/2022.  Echocardiogram showed EF 60 to 65%.  He presented with chest pain on 04/17/2022 and was found to have inferior STEMI.  Had V-fib arrest in ED, ROSC obtained after 1 shock.  Cath showed 99% stenosis mid OM 2 insertion site of SVG, underwent successful angioplasty.  Echo showed EF 45 to 50% antiplatelet therapy complicated by phenytoin use for seizures, he was switched from Plavix to Effient.  He was admitted with chest pain again 04/30/2022, underwent cath which showed patent bypass grafts.  Echo 11/10/22 showed EF 60-65%.  Since last clinic visit, he reports he is doing okay.  He continues to have occasional chest pain, particularly when he is under stress.  Describes pressure in center of chest, lasting 4 to 5 minutes.  He has not had to use any nitroglycerin.  Does report having some shortness of breath.  Also reports some dizziness but denies any syncope.  Past Medical History:  Diagnosis Date   Acute MI, 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   Esophageal reflux    HFrEF (heart failure with reduced ejection fraction) (HCC) 04/19/2022   Hiatal hernia    Hyperlipidemia    Low back pain    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   Seizure disorder (HCC)    Skin cancer    "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"   Tremor     Past Surgical History:  Procedure Laterality Date   COLONOSCOPY     CORONARY ARTERY BYPASS GRAFT N/A 03/26/2022   Procedure: CORONARY ARTERY BYPASS GRAFTING  (CABG) X THREE, USING LEFT INTERNAL MAMMARY ARTERY AND RIGHT LEG GREATER SAPHENOUS VEIN;  Surgeon: Eugenio Hoes, MD;  Location: [facility] OR;  Service: Open Heart Surgery;  Laterality: N/A;   CORONARY BALLOON ANGIOPLASTY N/A 04/17/2022   Procedure: CORONARY BALLOON ANGIOPLASTY;  Surgeon: Corky Crafts, MD;  Location: [facility] INVASIVE CV LAB;  Service: Cardiovascular;  Laterality: N/A;   INGUINAL HERNIA REPAIR Right 12/06/2013   Procedure:  REPAIR RIGHT INGUINAL HERNIA;  Surgeon: Clovis Pu. Cornett, MD;  Location: [facility];  Service: General;  Laterality: Right;   INGUINAL HERNIA REPAIR Left 07/21/2013   INSERTION OF MESH N/A 12/06/2013   Procedure: INSERTION OF MESH;  Surgeon: Clovis Pu. Cornett, MD;  Location: [facility];  Service: General;  Laterality: N/A;   LEFT HEART CATH AND CORONARY ANGIOGRAPHY N/A 03/20/2022   Procedure: LEFT HEART CATH AND CORONARY ANGIOGRAPHY;  Surgeon: Corky Crafts, MD;  Location: [facility] INVASIVE CV LAB;  Service: Cardiovascular;  Laterality: N/A;   LEFT HEART CATH AND CORONARY ANGIOGRAPHY N/A 04/17/2022   Procedure: LEFT HEART CATH AND CORONARY ANGIOGRAPHY;  Surgeon: Corky Crafts, MD;  Location: [facility] INVASIVE CV LAB;  Service: Cardiovascular;  Laterality: N/A;   LEFT HEART CATH AND CORS/GRAFTS ANGIOGRAPHY N/A 04/17/2022   Procedure: LEFT HEART CATH AND CORS/GRAFTS ANGIOGRAPHY;  Surgeon: Corky Crafts, MD;  Location:  [facility] INVASIVE CV LAB;  Service: Cardiovascular;  Laterality: N/A;   LEFT HEART CATH AND CORS/GRAFTS ANGIOGRAPHY N/A 05/01/2022   Procedure: LEFT HEART CATH AND CORS/GRAFTS ANGIOGRAPHY;  Surgeon: Corky Crafts, MD;  Location: [facility] INVASIVE CV LAB;  Service: Cardiovascular;  Laterality: N/A;   POLYPECTOMY     TEE WITHOUT CARDIOVERSION N/A 03/26/2022   Procedure: TRANSESOPHAGEAL ECHOCARDIOGRAM (TEE);  Surgeon: Eugenio Hoes, MD;  Location: [facility] OR;  Service: Open Heart Surgery;  Laterality: N/A;    Current  Medications: Current Meds  Medication Sig   acetaminophen (TYLENOL) 500 MG tablet Take 1,000 mg by mouth daily as needed for mild pain or headache.   aspirin EC 81 MG tablet Take 1 tablet (81 mg total) by mouth daily. Swallow whole.   Cholecalciferol (VITAMIN D-3 PO) Take 5,000 Units by mouth daily.   ezetimibe (ZETIA) 10 MG tablet Take 1 tablet (10 mg total) by mouth daily.   isosorbide mononitrate (IMDUR) 30 MG 24 hr tablet Take 1 tablet (30 mg total) by mouth daily.   lansoprazole (PREVACID) 30 MG capsule TAKE 1 CAPSULE DAILY BEFORE BREAKFAST   metoprolol succinate (TOPROL-XL) 25 MG 24 hr tablet Take 1 tablet (25 mg total) by mouth daily.   nitroGLYCERIN (NITROSTAT) 0.4 MG SL tablet Place 1 tablet (0.4 mg total) under the tongue every 5 (five) minutes as needed for chest pain.   phenytoin (DILANTIN) 100 MG ER capsule Take 2 capsules (200 mg total) by mouth every morning AND 3 capsules (300 mg total) at bedtime. Take 2 capsules (200 mg) every morning and then take 3 capsules (300 mg) at bedtime.   prasugrel (EFFIENT) 10 MG TABS tablet Take 1 tablet (10 mg total) by mouth daily.   rosuvastatin (CRESTOR) 40 MG tablet Take 1 tablet (40 mg total) by mouth daily.     Allergies:   Nexium [esomeprazole], Zocor [simvastatin], and Gaviscon [alum hydroxide-mag carbonate]   Social History   Socioeconomic History   Marital status: Married    Spouse name: Not on file   Number of children: 1   Years of education: Not on file   Highest education level: Not on file  Occupational History   Occupation: Shop Nurse, mental health: [employer]    Comment: [employer]  Tobacco Use   Smoking status: Former    Types: Cigarettes    Quit date: 07/21/2002    Years since quitting: 20.4    Passive exposure: Never   Smokeless tobacco: Never  Vaping Use   Vaping Use: Never used  Substance and Sexual Activity   Alcohol use: Yes    Comment: socially   Drug use: No   Sexual activity:  Not on file  Other Topics Concern   Not on file  Social History Narrative      He has been married 31+ years.   Caffeine Use: He drinks coke, tea, and coffee.   Truck Curator   Social Determinants of Health   Financial Resource Strain: Not on file  Food Insecurity: No Food Insecurity (03/29/2022)   Hunger Vital Sign    Worried About Running Out of Food in the Last Year: Never true    Ran Out of Food in the Last Year: Never true  Transportation Needs: No Transportation Needs (03/29/2022)   PRAPARE - Administrator, Civil Service (Medical): No    Lack of Transportation (Non-Medical): No  Physical Activity: Not on file  Stress: Not on file  Social Connections:  Not on file     Family History: The patient's family history includes Alcohol abuse in his brother; Alzheimer's disease in his mother; Cancer in his father; Colon polyps in his sister; Stroke in his sister. There is no history of Colon cancer, Esophageal cancer, Stomach cancer, or Rectal cancer.  ROS:   Please see the history of present illness.     All other systems reviewed and are negative.  EKGs/Labs/Other Studies Reviewed:    The following studies were reviewed today:  EKG:   10/14/2022: Normal sinus rhythm, rate 69, no ST abnormalities, poor R wave progression  Recent Labs: 03/19/2022: TSH 1.321 03/27/2022: Magnesium 2.2 07/22/2022: ALT 18 10/14/2022: BUN 11; Creatinine, Ser 0.95; Hemoglobin 15.2; Platelets 199; Potassium 4.8; Sodium 143  Recent Lipid Panel    Component Value Date/Time   CHOL 168 10/14/2022 0844   TRIG 70 10/14/2022 0844   HDL 86 10/14/2022 0844   CHOLHDL 2.0 10/14/2022 0844   CHOLHDL 2.1 04/17/2022 1238   VLDL 25 04/17/2022 1238   LDLCALC 69 10/14/2022 0844   LDLDIRECT 194.1 04/27/2009 0901    Physical Exam:    VS:  BP 120/80   Pulse 75   Ht 6\' 2"  (1.88 m)   Wt 224 lb 9.6 oz (101.9 kg)   SpO2 99%   BMI 28.84 kg/m     Wt Readings from Last 3 Encounters:  01/14/23 224 lb  9.6 oz (101.9 kg)  10/14/22 238 lb (108 kg)  07/31/22 237 lb (107.5 kg)     GEN:  Well nourished, well developed in no acute distress HEENT: Normal NECK: No JVD; No carotid bruits CARDIAC: RRR, no murmurs, rubs, gallops RESPIRATORY:  Clear to auscultation without rales, wheezing or rhonchi  ABDOMEN: Soft, non-tender, non-distended MUSCULOSKELETAL:  No edema; No deformity  SKIN: Warm and dry NEUROLOGIC:  Alert and oriented x 3 PSYCHIATRIC:  Normal affect   ASSESSMENT:    1. CAD S/P percutaneous coronary angioplasty   2. Cardiac arrest (HCC)   3. Acute systolic heart failure (HCC)   4. Hyperlipidemia, unspecified hyperlipidemia type     PLAN:    CAD: underwent CABG x 3 with LIMA-LAD, sequential SVG to ramus and OM on 03/26/2022.  Echocardiogram showed EF 60 to 65%.  He presented with chest pain on 04/17/2022 and was found to have inferior STEMI.  Had V-fib arrest in ED, ROSC obtained after 1 shock.  Cath showed 99% stenosis mid OM 2 insertion site of SVG, underwent successful angioplasty.  Echo showed EF 45 to 50% antiplatelet therapy complicated by phenytoin use for seizures, he was switched from Plavix to Effient.  He was admitted with chest pain again 04/30/2022, underwent cath which showed patent bypass grafts. -Continue aspirin and Effient.  Not on Brilinta due to interaction with phenytoin -Continue rosuvastatin 40 mg daily and Zetia 10 mg daily -Continue Toprol-XL 25 mg daily -Continue Imdur 30 mg daily -As needed sublingual nitroglycerin -Reports recent chest pain.  Recommend stress PET for further evaluation, scheduled for 7/9  VF arrest: On 04/09/2022 in setting of inferior STEMI.  ROSC obtained after 1 shock.  No significant ventricular arrhythmias following revascularization.  Continue Toprol-XL 25 mg daily  Acute systolic heart failure: EF 45 to 50% on echocardiogram 03/2022.  Echo 11/10/22 showed EF 60-65%. -Continue Toprol-XL 25 mg daily -Has not been on ACE/ARB/Arni due  to soft BP  Hyperlipidemia: LDL 84 on 05/19/2022, on rosuvastatin 40 mg daily.  Zetia 10 mg daily added.  LDL 69 on  10/14/22.  Seizures: On phenytoin  RTC in 3 months    Shared Decision Making/Informed Consent The risks [chest pain, shortness of breath, cardiac arrhythmias, dizziness, blood pressure fluctuations, myocardial infarction, stroke/transient ischemic attack, nausea, vomiting, allergic reaction, radiation exposure, metallic taste sensation and life-threatening complications (estimated to be 1 in 10,000)], benefits (risk stratification, diagnosing coronary artery disease, treatment guidance) and alternatives of a cardiac PET stress test were discussed in detail with Mr. Shaler and he agrees to proceed.   Medication Adjustments/Labs and Tests Ordered: Current medicines are reviewed at length with the patient today.  Concerns regarding medicines are outlined above.  No orders of the defined types were placed in this encounter.  No orders of the defined types were placed in this encounter.   Patient Instructions  Medication Instructions:  No changes *If you need a refill on your cardiac medications before your next appointment, please call your pharmacy*  Follow-Up: At [facility], you and your health needs are our priority.  As part of our continuing mission to provide you with exceptional heart care, we have created designated Provider Care Teams.  These Care Teams include your primary Cardiologist (physician) and Advanced Practice Providers (APPs -  Physician Assistants and Nurse Practitioners) who all work together to provide you with the care you need, when you need it.  We recommend signing up for the patient portal called "MyChart".  Sign up information is provided on this After Visit Summary.  MyChart is used to connect with patients for Virtual Visits (Telemedicine).  Patients are able to view lab/test results, encounter notes, upcoming appointments, etc.   Non-urgent messages can be sent to your provider as well.   To learn more about what you can do with MyChart, go to [email or web address].    Your next appointment:   4 month(s)  Provider:   Little Ishikawa, MD       Signed, Little Ishikawa, MD  01/14/2023 5:20 PM    [facility]

## 2023-01-14 NOTE — Patient Instructions (Addendum)
Medication Instructions:  No changes *If you need a refill on your cardiac medications before your next appointment, please call your pharmacy*  Follow-Up: At [facility], you and your health needs are our priority.  As part of our continuing mission to provide you with exceptional heart care, we have created designated Provider Care Teams.  These Care Teams include your primary Cardiologist (physician) and Advanced Practice Providers (APPs -  Physician Assistants and Nurse Practitioners) who all work together to provide you with the care you need, when you need it.  We recommend signing up for the patient portal called "MyChart".  Sign up information is provided on this After Visit Summary.  MyChart is used to connect with patients for Virtual Visits (Telemedicine).  Patients are able to view lab/test results, encounter notes, upcoming appointments, etc.  Non-urgent messages can be sent to your provider as well.   To learn more about what you can do with MyChart, go to [email or web address].    Your next appointment:   4 month(s)  Provider:   Little Ishikawa, MD

## 2023-01-15 ENCOUNTER — Other Ambulatory Visit: Payer: Self-pay

## 2023-01-15 MED ORDER — METOPROLOL SUCCINATE ER 25 MG PO TB24: 25.0000 mg | ORAL_TABLET | Freq: Every day | ORAL | 3 refills | Status: DC

## 2023-01-21 ENCOUNTER — Encounter (HOSPITAL_COMMUNITY): Payer: Self-pay

## 2023-01-27 ENCOUNTER — Ambulatory Visit (HOSPITAL_COMMUNITY)
Admission: RE | Admit: 2023-01-27 | Discharge: 2023-01-27 | Disposition: A | Discharge status: Home / Self Care | Payer: BC Managed Care – PPO | Source: Ambulatory Visit | Attending: Cardiology | Admitting: Cardiology

## 2023-01-27 DIAGNOSIS — Z951 Presence of aortocoronary bypass graft: Secondary | ICD-10-CM | POA: Insufficient documentation

## 2023-01-27 DIAGNOSIS — I2510 Atherosclerotic heart disease of native coronary artery without angina pectoris: Secondary | ICD-10-CM | POA: Insufficient documentation

## 2023-01-27 DIAGNOSIS — R079 Chest pain, unspecified: Secondary | ICD-10-CM | POA: Diagnosis not present

## 2023-01-27 DIAGNOSIS — I700 Atherosclerosis of aorta: Secondary | ICD-10-CM | POA: Diagnosis not present

## 2023-01-27 DIAGNOSIS — Z9861 Coronary angioplasty status: Secondary | ICD-10-CM | POA: Diagnosis not present

## 2023-01-27 MED ORDER — REGADENOSON 0.4 MG/5ML IV SOLN
INTRAVENOUS | Status: AC
  Filled 2023-01-27: qty 5

## 2023-01-27 MED ORDER — REGADENOSON 0.4 MG/5ML IV SOLN
0.4000 mg | Freq: Once | INTRAVENOUS | Status: AC
  Administered 2023-01-27: 0.4 mg via INTRAVENOUS

## 2023-01-27 MED ORDER — RUBIDIUM RB82 GENERATOR (RUBYFILL)
25.0000 | PACK | Freq: Once | INTRAVENOUS | Status: AC
  Administered 2023-01-27: 26.28 via INTRAVENOUS

## 2023-01-27 NOTE — Progress Notes (Signed)
Tolerated test well.  No dizziness upon standing

## 2023-01-28 LAB — NM PET CT CARDIAC PERFUSION MULTI W/ABSOLUTE BLOODFLOW
LV dias vol: 110 mL (ref 62–150)
LV sys vol: 30 mL
MBFR: 2.86
Nuc Rest EF: 68 %
Nuc Stress EF: 71 %
Peak HR: 71 {beats}/min
Rest HR: 64 {beats}/min
Rest MBF: 0.77 ml/g/min
Rest Nuclear Isotope Dose: 26.3 mCi
ST Depression (mm): 0 mm
Stress MBF: 2.2 ml/g/min
Stress Nuclear Isotope Dose: 26.3 mCi
TID: 1.04

## 2023-02-02 NOTE — Progress Notes (Unsigned)
Cardiology Office Note:    Date:  02/08/2023   ID:  Wayne Vang, DOB 1960/09/12, MRN [id number]  PCP:  Hannah Beat, MD  Cardiologist:  Little Ishikawa, MD  Electrophysiologist:  None   Referring MD: Hannah Beat, MD   Chief Complaint  Patient presents with   Coronary Artery Disease    History of Present Illness:    Wayne Vang is a 62 y.o. male with a hx of CAD status post CABG 03/2022, seizures, hyperlipidemia who presents for follow-up.  Patient underwent CABG x 3 with LIMA-LAD, sequential SVG to ramus and OM on 03/26/2022.  Echocardiogram showed EF 60 to 65%.  He presented with chest pain on 04/17/2022 and was found to have inferior STEMI.  Had V-fib arrest in ED, ROSC obtained after 1 shock.  Cath showed 99% stenosis mid OM 2 insertion site of SVG, underwent successful angioplasty.  Echo showed EF 45 to 50% antiplatelet therapy complicated by phenytoin use for seizures, he was switched from Plavix to Effient.  He was admitted with chest pain again 04/30/2022, underwent cath which showed patent bypass grafts.  Echo 11/10/22 showed EF 60-65%.  He reported recurrent chest pain and underwent stress PET on 01/27/2023 which showed lateral wall ischemia, normal myocardial blood flow reserve (though difficult to interpret in setting of prior CABG).  Since last clinic visit, he reports that he continues to have chest pain.  Describes as chest tightness that occurs with working and improves with rest.  Has not had to use nitroglycerin.  Reports occasional lightheadedness but denies any syncope.  Denies any shortness of breath.  Does report occasional palpitations.  Denies any bleeding issues on DAPT.  Past Medical History:  Diagnosis Date   Acute MI, 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   Esophageal reflux    HFrEF (heart failure with reduced ejection fraction) (HCC) 04/19/2022   Hiatal hernia    Hyperlipidemia    Low back pain    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   Seizure disorder (HCC)     Skin cancer    "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"   Tremor     Past Surgical History:  Procedure Laterality Date   COLONOSCOPY     CORONARY ARTERY BYPASS GRAFT N/A 03/26/2022   Procedure: CORONARY ARTERY BYPASS GRAFTING (CABG) X THREE, USING LEFT INTERNAL MAMMARY ARTERY AND RIGHT LEG GREATER SAPHENOUS VEIN;  Surgeon: Eugenio Hoes, MD;  Location: [facility];  Service: Open Heart Surgery;  Laterality: N/A;   CORONARY BALLOON ANGIOPLASTY N/A 04/17/2022   Procedure: CORONARY BALLOON ANGIOPLASTY;  Surgeon: Corky Crafts, MD;  Location: [facility] INVASIVE CV LAB;  Service: Cardiovascular;  Laterality: N/A;   INGUINAL HERNIA REPAIR Right 12/06/2013   Procedure:  REPAIR RIGHT INGUINAL HERNIA;  Surgeon: Clovis Pu. Cornett, MD;  Location: [facility];  Service: General;  Laterality: Right;   INGUINAL HERNIA REPAIR Left 07/21/2013   INSERTION OF MESH N/A 12/06/2013   Procedure: INSERTION OF MESH;  Surgeon: Clovis Pu. Cornett, MD;  Location: [facility];  Service: General;  Laterality: N/A;   LEFT HEART CATH AND CORONARY ANGIOGRAPHY N/A 03/20/2022   Procedure: LEFT HEART CATH AND CORONARY ANGIOGRAPHY;  Surgeon: Corky Crafts, MD;  Location: [facility] INVASIVE CV LAB;  Service: Cardiovascular;  Laterality: N/A;   LEFT HEART CATH AND CORONARY ANGIOGRAPHY N/A 04/17/2022   Procedure: LEFT HEART CATH AND CORONARY ANGIOGRAPHY;  Surgeon: Corky Crafts, MD;  Location: [facility] INVASIVE CV LAB;  Service: Cardiovascular;  Laterality:  N/A;   LEFT HEART CATH AND CORS/GRAFTS ANGIOGRAPHY N/A 04/17/2022   Procedure: LEFT HEART CATH AND CORS/GRAFTS ANGIOGRAPHY;  Surgeon: Corky Crafts, MD;  Location: [facility] INVASIVE CV LAB;  Service: Cardiovascular;  Laterality: N/A;   LEFT HEART CATH AND CORS/GRAFTS ANGIOGRAPHY N/A 05/01/2022   Procedure: LEFT HEART CATH AND CORS/GRAFTS ANGIOGRAPHY;  Surgeon: Corky Crafts, MD;  Location: [facility] INVASIVE CV LAB;  Service: Cardiovascular;  Laterality: N/A;   POLYPECTOMY      TEE WITHOUT CARDIOVERSION N/A 03/26/2022   Procedure: TRANSESOPHAGEAL ECHOCARDIOGRAM (TEE);  Surgeon: Eugenio Hoes, MD;  Location: [facility] OR;  Service: Open Heart Surgery;  Laterality: N/A;    Current Medications: Current Meds  Medication Sig   acetaminophen (TYLENOL) 500 MG tablet Take 1,000 mg by mouth daily as needed for mild pain or headache.   aspirin EC 81 MG tablet Take 1 tablet (81 mg total) by mouth daily. Swallow whole.   Cholecalciferol (VITAMIN D-3 PO) Take 5,000 Units by mouth daily.   ezetimibe (ZETIA) 10 MG tablet Take 1 tablet (10 mg total) by mouth daily.   lansoprazole (PREVACID) 30 MG capsule TAKE 1 CAPSULE DAILY BEFORE BREAKFAST   metoprolol succinate (TOPROL-XL) 25 MG 24 hr tablet Take 1 tablet (25 mg total) by mouth daily.   nitroGLYCERIN (NITROSTAT) 0.4 MG SL tablet Place 1 tablet (0.4 mg total) under the tongue every 5 (five) minutes as needed for chest pain.   phenytoin (DILANTIN) 100 MG ER capsule Take 2 capsules (200 mg total) by mouth every morning AND 3 capsules (300 mg total) at bedtime. Take 2 capsules (200 mg) every morning and then take 3 capsules (300 mg) at bedtime.   prasugrel (EFFIENT) 10 MG TABS tablet Take 1 tablet (10 mg total) by mouth daily.   rosuvastatin (CRESTOR) 40 MG tablet Take 1 tablet (40 mg total) by mouth daily.   [DISCONTINUED] isosorbide mononitrate (IMDUR) 30 MG 24 hr tablet Take 1 tablet (30 mg total) by mouth daily.     Allergies:   Nexium [esomeprazole], Zocor [simvastatin], and Gaviscon [alum hydroxide-mag carbonate]   Social History   Socioeconomic History   Marital status: Married    Spouse name: Not on file   Number of children: 1   Years of education: Not on file   Highest education level: Not on file  Occupational History   Occupation: Shop Nurse, mental health: [employer]    Comment: [employer]  Tobacco Use   Smoking status: Former    Current packs/day: 0.00    Types: Cigarettes    Quit date:  07/21/2002    Years since quitting: 20.5    Passive exposure: Never   Smokeless tobacco: Never  Vaping Use   Vaping status: Never Used  Substance and Sexual Activity   Alcohol use: Yes    Comment: socially   Drug use: No   Sexual activity: Not on file  Other Topics Concern   Not on file  Social History Narrative      He has been married 31+ years.   Caffeine Use: He drinks coke, tea, and coffee.   Truck Curator   Social Determinants of Health   Financial Resource Strain: Not on file  Food Insecurity: No Food Insecurity (03/29/2022)   Hunger Vital Sign    Worried About Running Out of Food in the Last Year: Never true    Ran Out of Food in the Last Year: Never true  Transportation Needs: No Transportation Needs (03/29/2022)  PRAPARE - Administrator, Civil Service (Medical): No    Lack of Transportation (Non-Medical): No  Physical Activity: Not on file  Stress: Not on file  Social Connections: Not on file     Family History: The patient's family history includes Alcohol abuse in his brother; Alzheimer's disease in his mother; Cancer in his father; Colon polyps in his sister; Stroke in his sister. There is no history of Colon cancer, Esophageal cancer, Stomach cancer, or Rectal cancer.  ROS:   Please see the history of present illness.     All other systems reviewed and are negative.  EKGs/Labs/Other Studies Reviewed:    The following studies were reviewed today:  EKG:   10/14/2022: Normal sinus rhythm, rate 69, no ST abnormalities, poor R wave progression  Recent Labs: 03/19/2022: TSH 1.321 03/27/2022: Magnesium 2.2 07/22/2022: ALT 18 10/14/2022: BUN 11; Creatinine, Ser 0.95; Hemoglobin 15.2; Platelets 199; Potassium 4.8; Sodium 143  Recent Lipid Panel    Component Value Date/Time   CHOL 168 10/14/2022 0844   TRIG 70 10/14/2022 0844   HDL 86 10/14/2022 0844   CHOLHDL 2.0 10/14/2022 0844   CHOLHDL 2.1 04/17/2022 1238   VLDL 25 04/17/2022 1238   LDLCALC 69  10/14/2022 0844   LDLDIRECT 194.1 04/27/2009 0901    Physical Exam:    VS:  BP 109/72   Pulse (!) 59   Ht 6\' 2"  (1.88 m)   Wt 227 lb 4.8 oz (103.1 kg)   BMI 29.18 kg/m     Wt Readings from Last 3 Encounters:  02/06/23 227 lb 4.8 oz (103.1 kg)  01/14/23 224 lb 9.6 oz (101.9 kg)  10/14/22 238 lb (108 kg)     GEN:  Well nourished, well developed in no acute distress HEENT: Normal NECK: No JVD; No carotid bruits CARDIAC: RRR, no murmurs, rubs, gallops RESPIRATORY:  Clear to auscultation without rales, wheezing or rhonchi  ABDOMEN: Soft, non-tender, non-distended MUSCULOSKELETAL:  No edema; No deformity  SKIN: Warm and dry NEUROLOGIC:  Alert and oriented x 3 PSYCHIATRIC:  Normal affect   ASSESSMENT:    1. CAD S/P percutaneous coronary angioplasty   2. Cardiac arrest (HCC)   3. Acute systolic heart failure (HCC)   4. Hyperlipidemia, unspecified hyperlipidemia type      PLAN:    CAD: underwent CABG x 3 with LIMA-LAD, sequential SVG to ramus and OM on 03/26/2022.  Echocardiogram showed EF 60 to 65%.  He presented with chest pain on 04/17/2022 and was found to have inferior STEMI.  Had V-fib arrest in ED, ROSC obtained after 1 shock.  Cath showed 99% stenosis mid OM 2 insertion site of SVG, underwent successful angioplasty.  Echo showed EF 45 to 50% antiplatelet therapy complicated by phenytoin use for seizures, he was switched from Plavix to Effient.  He was admitted with chest pain again 04/30/2022, underwent cath which showed patent bypass grafts.  He reported recurrent chest pain and underwent stress PET on 01/27/2023 which showed lateral wall ischemia, normal myocardial blood flow reserve (though difficult to interpret in setting of prior CABG). -Continue aspirin and Effient.  Not on Brilinta due to interaction with phenytoin -Continue rosuvastatin 40 mg daily and Zetia 10 mg daily -Continue Toprol-XL 25 mg daily -As needed sublingual nitroglycerin -No high risk findings on  stress PET, but does suggest latera wall ischemia, and he is having exertional chest pain.  Will trial medical management but if persistent chest pain plan for cath.  Will increase imdur  to 60 mg daily  VF arrest: On 04/09/2022 in setting of inferior STEMI.  ROSC obtained after 1 shock.  No significant ventricular arrhythmias following revascularization.  Continue Toprol-XL 25 mg daily  Acute systolic heart failure: EF 45 to 50% on echocardiogram 03/2022.  Echo 11/10/22 showed EF 60-65%. -Continue Toprol-XL 25 mg daily -Has not been on ACE/ARB/Arni due to soft BP  Hyperlipidemia: LDL 84 on 05/19/2022, on rosuvastatin 40 mg daily.  Zetia 10 mg daily added.  LDL 69 on 10/14/22.  Seizures: On phenytoin  RTC in 3 months   Medication Adjustments/Labs and Tests Ordered: Current medicines are reviewed at length with the patient today.  Concerns regarding medicines are outlined above.  No orders of the defined types were placed in this encounter.  Meds ordered this encounter  Medications   isosorbide mononitrate (IMDUR) 60 MG 24 hr tablet    Sig: Take 1 tablet (60 mg total) by mouth daily.    Dispense:  90 tablet    Refill:  3    NEW DOSE, D/C 30 MG RX    Patient Instructions  Medication Instructions:  INCREASE YOUR ISOSORBIDE TO 60 MG DAILY   Labwork: NONE  Testing/Procedures: NONE  Follow-Up: KEEP OCTOBER APPOINTMENT AS SCHEDULED       Signed, Little Ishikawa, MD  02/08/2023 3:51 PM    [facility]

## 2023-02-06 ENCOUNTER — Encounter (HOSPITAL_BASED_OUTPATIENT_CLINIC_OR_DEPARTMENT_OTHER): Payer: Self-pay | Admitting: Cardiology

## 2023-02-06 ENCOUNTER — Ambulatory Visit (HOSPITAL_BASED_OUTPATIENT_CLINIC_OR_DEPARTMENT_OTHER): Payer: BC Managed Care – PPO | Admitting: Cardiology

## 2023-02-06 VITALS — BP 109/72 | HR 59 | Ht 74.0 in | Wt 227.3 lb

## 2023-02-06 DIAGNOSIS — I2510 Atherosclerotic heart disease of native coronary artery without angina pectoris: Secondary | ICD-10-CM

## 2023-02-06 DIAGNOSIS — Z9861 Coronary angioplasty status: Secondary | ICD-10-CM

## 2023-02-06 DIAGNOSIS — I469 Cardiac arrest, cause unspecified: Secondary | ICD-10-CM | POA: Diagnosis not present

## 2023-02-06 DIAGNOSIS — I5021 Acute systolic (congestive) heart failure: Secondary | ICD-10-CM | POA: Diagnosis not present

## 2023-02-06 DIAGNOSIS — E785 Hyperlipidemia, unspecified: Secondary | ICD-10-CM | POA: Diagnosis not present

## 2023-02-06 MED ORDER — ISOSORBIDE MONONITRATE ER 60 MG PO TB24: 60.0000 mg | ORAL_TABLET | Freq: Every day | ORAL | 3 refills | Status: DC

## 2023-02-06 NOTE — Patient Instructions (Signed)
Medication Instructions:  INCREASE YOUR ISOSORBIDE TO 60 MG DAILY   Labwork: NONE  Testing/Procedures: NONE  Follow-Up: KEEP OCTOBER APPOINTMENT AS SCHEDULED

## 2023-02-19 ENCOUNTER — Encounter (HOSPITAL_COMMUNITY): Payer: Self-pay

## 2023-02-19 ENCOUNTER — Other Ambulatory Visit: Payer: Self-pay

## 2023-02-19 ENCOUNTER — Emergency Department (HOSPITAL_COMMUNITY): Payer: BC Managed Care – PPO

## 2023-02-19 ENCOUNTER — Telehealth: Payer: Self-pay | Admitting: Cardiology

## 2023-02-19 ENCOUNTER — Observation Stay (HOSPITAL_COMMUNITY)
Admission: EM | Admit: 2023-02-19 | Discharge: 2023-02-20 | Disposition: A | Discharge status: Home / Self Care | Payer: BC Managed Care – PPO | Source: Home / Self Care | Attending: Emergency Medicine | Admitting: Emergency Medicine

## 2023-02-19 DIAGNOSIS — R0789 Other chest pain: Secondary | ICD-10-CM | POA: Diagnosis not present

## 2023-02-19 DIAGNOSIS — Z85828 Personal history of other malignant neoplasm of skin: Secondary | ICD-10-CM | POA: Insufficient documentation

## 2023-02-19 DIAGNOSIS — I200 Unstable angina: Secondary | ICD-10-CM | POA: Diagnosis present

## 2023-02-19 DIAGNOSIS — R079 Chest pain, unspecified: Secondary | ICD-10-CM | POA: Insufficient documentation

## 2023-02-19 DIAGNOSIS — I5032 Chronic diastolic (congestive) heart failure: Secondary | ICD-10-CM | POA: Diagnosis not present

## 2023-02-19 DIAGNOSIS — Z951 Presence of aortocoronary bypass graft: Secondary | ICD-10-CM

## 2023-02-19 DIAGNOSIS — Z87891 Personal history of nicotine dependence: Secondary | ICD-10-CM | POA: Diagnosis not present

## 2023-02-19 DIAGNOSIS — I4901 Ventricular fibrillation: Secondary | ICD-10-CM | POA: Diagnosis present

## 2023-02-19 DIAGNOSIS — Z7982 Long term (current) use of aspirin: Secondary | ICD-10-CM | POA: Diagnosis not present

## 2023-02-19 DIAGNOSIS — Z79899 Other long term (current) drug therapy: Secondary | ICD-10-CM | POA: Diagnosis not present

## 2023-02-19 DIAGNOSIS — R569 Unspecified convulsions: Secondary | ICD-10-CM

## 2023-02-19 DIAGNOSIS — E785 Hyperlipidemia, unspecified: Secondary | ICD-10-CM | POA: Diagnosis present

## 2023-02-19 DIAGNOSIS — I259 Chronic ischemic heart disease, unspecified: Secondary | ICD-10-CM | POA: Diagnosis not present

## 2023-02-19 DIAGNOSIS — I5020 Unspecified systolic (congestive) heart failure: Secondary | ICD-10-CM | POA: Diagnosis present

## 2023-02-19 DIAGNOSIS — I25110 Atherosclerotic heart disease of native coronary artery with unstable angina pectoris: Secondary | ICD-10-CM | POA: Diagnosis not present

## 2023-02-19 DIAGNOSIS — E782 Mixed hyperlipidemia: Secondary | ICD-10-CM

## 2023-02-19 LAB — BASIC METABOLIC PANEL
Anion gap: 13 (ref 5–15)
BUN: 10 mg/dL (ref 8–23)
CO2: 25 mmol/L (ref 22–32)
Calcium: 9.2 mg/dL (ref 8.9–10.3)
Chloride: 103 mmol/L (ref 98–111)
Creatinine, Ser: 0.86 mg/dL (ref 0.61–1.24)
GFR, Estimated: >60 mL/min (ref >60)
Glucose, Bld: 85 mg/dL (ref 70–99)
Potassium: 3.7 mmol/L (ref 3.5–5.1)
Sodium: 141 mmol/L (ref 135–145)

## 2023-02-19 LAB — CBC
HCT: 42.3 % (ref 39.0–52.0)
Hemoglobin: 14.9 g/dL (ref 13.0–17.0)
MCH: 31.2 pg (ref 26.0–34.0)
MCHC: 35.2 g/dL (ref 30.0–36.0)
MCV: 88.5 fL (ref 80.0–100.0)
Platelets: 175 10*3/uL (ref 150–400)
RBC: 4.78 MIL/uL (ref 4.22–5.81)
RDW: 12.3 % (ref 11.5–15.5)
WBC: 5.3 10*3/uL (ref 4.0–10.5)
nRBC: 0 % (ref 0.0–0.2)

## 2023-02-19 LAB — TROPONIN I (HIGH SENSITIVITY)
Troponin I (High Sensitivity): 4 ng/L (ref <18)
Troponin I (High Sensitivity): 4 ng/L (ref <18)

## 2023-02-19 MED ORDER — ASPIRIN 81 MG PO CHEW
243.0000 mg | CHEWABLE_TABLET | Freq: Once | ORAL | Status: AC
  Administered 2023-02-19: 243 mg via ORAL
  Filled 2023-02-19: qty 3

## 2023-02-19 MED ORDER — EZETIMIBE 10 MG PO TABS
10.0000 mg | ORAL_TABLET | Freq: Every day | ORAL | Status: DC
  Administered 2023-02-20: 10 mg via ORAL
  Filled 2023-02-19: qty 1

## 2023-02-19 MED ORDER — PRASUGREL HCL 10 MG PO TABS
10.0000 mg | ORAL_TABLET | Freq: Every day | ORAL | Status: DC
  Administered 2023-02-20: 10 mg via ORAL
  Filled 2023-02-19: qty 1

## 2023-02-19 MED ORDER — ORAL CARE MOUTH RINSE: 15.0000 mL | OROMUCOSAL | Status: DC | PRN

## 2023-02-19 MED ORDER — ROSUVASTATIN CALCIUM 20 MG PO TABS
40.0000 mg | ORAL_TABLET | Freq: Every day | ORAL | Status: DC
  Administered 2023-02-20: 40 mg via ORAL
  Filled 2023-02-19: qty 2

## 2023-02-19 MED ORDER — ACETAMINOPHEN 325 MG PO TABS: 650.0000 mg | ORAL_TABLET | ORAL | Status: DC | PRN

## 2023-02-19 MED ORDER — SODIUM CHLORIDE 0.9 % WEIGHT BASED INFUSION
3.0000 mL/kg/h | INTRAVENOUS | Status: DC
  Administered 2023-02-20: 3 mL/kg/h via INTRAVENOUS

## 2023-02-19 MED ORDER — PHENYTOIN SODIUM EXTENDED 100 MG PO CAPS
200.0000 mg | ORAL_CAPSULE | Freq: Every day | ORAL | Status: DC
  Administered 2023-02-20: 200 mg via ORAL
  Filled 2023-02-19 (×2): qty 2

## 2023-02-19 MED ORDER — ASPIRIN 81 MG PO TBEC
81.0000 mg | DELAYED_RELEASE_TABLET | Freq: Every day | ORAL | Status: DC
  Administered 2023-02-20: 81 mg via ORAL
  Filled 2023-02-19: qty 1

## 2023-02-19 MED ORDER — ONDANSETRON HCL 4 MG/2ML IJ SOLN: 4.0000 mg | Freq: Four times a day (QID) | INTRAMUSCULAR | Status: DC | PRN

## 2023-02-19 MED ORDER — PHENYTOIN SODIUM EXTENDED 100 MG PO CAPS
300.0000 mg | ORAL_CAPSULE | Freq: Every day | ORAL | Status: DC
  Administered 2023-02-19: 300 mg via ORAL
  Filled 2023-02-19 (×2): qty 3

## 2023-02-19 MED ORDER — PANTOPRAZOLE SODIUM 40 MG PO TBEC
40.0000 mg | DELAYED_RELEASE_TABLET | Freq: Every day | ORAL | Status: DC
  Administered 2023-02-20: 40 mg via ORAL
  Filled 2023-02-19: qty 1

## 2023-02-19 MED ORDER — ISOSORBIDE MONONITRATE ER 60 MG PO TB24
60.0000 mg | ORAL_TABLET | Freq: Every day | ORAL | Status: DC
  Administered 2023-02-20: 60 mg via ORAL
  Filled 2023-02-19: qty 1

## 2023-02-19 MED ORDER — NITROGLYCERIN 0.4 MG SL SUBL: 0.4000 mg | SUBLINGUAL_TABLET | SUBLINGUAL | Status: DC | PRN

## 2023-02-19 MED ORDER — METOPROLOL SUCCINATE ER 25 MG PO TB24
25.0000 mg | ORAL_TABLET | Freq: Every day | ORAL | Status: DC
  Administered 2023-02-20: 25 mg via ORAL
  Filled 2023-02-19: qty 1

## 2023-02-19 MED ORDER — ENOXAPARIN SODIUM 40 MG/0.4ML IJ SOSY
40.0000 mg | PREFILLED_SYRINGE | INTRAMUSCULAR | Status: DC
  Administered 2023-02-19: 40 mg via SUBCUTANEOUS
  Filled 2023-02-19: qty 0.4

## 2023-02-19 MED ORDER — SODIUM CHLORIDE 0.9 % WEIGHT BASED INFUSION: 1.0000 mL/kg/h | INTRAVENOUS | Status: DC

## 2023-02-19 NOTE — ED Notes (Signed)
ED TO INPATIENT HANDOFF REPORT  ED Nurse Name and Phone #: Danille Oppedisano [phone]  S Name/Age/Gender Wayne Vang 62 y.o. male Room/Bed: 023C/023C  Code Status   Code Status: Full Code  Home/SNF/Other Home Patient oriented to: self, place, time, and situation Is this baseline? Yes   Triage Complete: Triage complete  Chief Complaint Chest pain [R07.9]  Triage Note Pt arrives via POV. Pt reports he began experiencing chest pain that started this morning. Reports associated sob with exertion. Pt took 2 SL nitroglycerin with minimal relief.Pt AxOx4.     Allergies Allergies  Allergen Reactions   Nexium [Esomeprazole] Other (See Comments)    Abdominal pain   Zocor [Simvastatin] Other (See Comments)    Ineffective     Gaviscon [Alum Hydroxide-Mag Carbonate] Other (See Comments)    Medication interaction only    Level of Care/Admitting Diagnosis ED Disposition     ED Disposition  Admit   Condition  --   Comment  Hospital Area: [facility] [[id number]]  Level of Care: Telemetry Cardiac [103]  May place patient in observation at [facility] or [facility] if equivalent level of care is available:: No  Covid Evaluation: Asymptomatic - no recent exposure (last 10 days) testing not required  Diagnosis: Chest pain [629528]  Admitting Physician: Parke Poisson [[id number]]  Attending Physician: Parke Poisson [[id number]]          B Medical/Surgery History Past Medical History:  Diagnosis Date   Acute MI, 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   Esophageal reflux    HFrEF (heart failure with reduced ejection fraction) (HCC) 04/19/2022   Hiatal hernia    Hyperlipidemia    Low back pain    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   Seizure disorder (HCC)    Skin cancer    "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"   Tremor    Past Surgical History:  Procedure Laterality Date   COLONOSCOPY     CORONARY ARTERY BYPASS GRAFT N/A 03/26/2022   Procedure: CORONARY ARTERY BYPASS GRAFTING  (CABG) X THREE, USING LEFT INTERNAL MAMMARY ARTERY AND RIGHT LEG GREATER SAPHENOUS VEIN;  Surgeon: Eugenio Hoes, MD;  Location: [facility] OR;  Service: Open Heart Surgery;  Laterality: N/A;   CORONARY BALLOON ANGIOPLASTY N/A 04/17/2022   Procedure: CORONARY BALLOON ANGIOPLASTY;  Surgeon: Corky Crafts, MD;  Location: [facility] INVASIVE CV LAB;  Service: Cardiovascular;  Laterality: N/A;   INGUINAL HERNIA REPAIR Right 12/06/2013   Procedure:  REPAIR RIGHT INGUINAL HERNIA;  Surgeon: Clovis Pu. Cornett, MD;  Location: [facility];  Service: General;  Laterality: Right;   INGUINAL HERNIA REPAIR Left 07/21/2013   INSERTION OF MESH N/A 12/06/2013   Procedure: INSERTION OF MESH;  Surgeon: Clovis Pu. Cornett, MD;  Location: [facility];  Service: General;  Laterality: N/A;   LEFT HEART CATH AND CORONARY ANGIOGRAPHY N/A 03/20/2022   Procedure: LEFT HEART CATH AND CORONARY ANGIOGRAPHY;  Surgeon: Corky Crafts, MD;  Location: [facility] INVASIVE CV LAB;  Service: Cardiovascular;  Laterality: N/A;   LEFT HEART CATH AND CORONARY ANGIOGRAPHY N/A 04/17/2022   Procedure: LEFT HEART CATH AND CORONARY ANGIOGRAPHY;  Surgeon: Corky Crafts, MD;  Location: [facility] INVASIVE CV LAB;  Service: Cardiovascular;  Laterality: N/A;   LEFT HEART CATH AND CORS/GRAFTS ANGIOGRAPHY N/A 04/17/2022   Procedure: LEFT HEART CATH AND CORS/GRAFTS ANGIOGRAPHY;  Surgeon: Corky Crafts, MD;  Location: [facility] INVASIVE CV LAB;  Service: Cardiovascular;  Laterality: N/A;   LEFT HEART CATH  AND CORS/GRAFTS ANGIOGRAPHY N/A 05/01/2022   Procedure: LEFT HEART CATH AND CORS/GRAFTS ANGIOGRAPHY;  Surgeon: Corky Crafts, MD;  Location: [facility] INVASIVE CV LAB;  Service: Cardiovascular;  Laterality: N/A;   POLYPECTOMY     TEE WITHOUT CARDIOVERSION N/A 03/26/2022   Procedure: TRANSESOPHAGEAL ECHOCARDIOGRAM (TEE);  Surgeon: Eugenio Hoes, MD;  Location: [facility] OR;  Service: Open Heart Surgery;  Laterality: N/A;     A IV  Location/Drains/Wounds Patient Lines/Drains/Airways Status     Active Line/Drains/Airways     Name Placement date Placement time Site Days   Sheath 05/01/22 Right Femoral;Arterial 05/01/22  1514  Femoral;Arterial  294            Intake/Output Last 24 hours No intake or output data in the 24 hours ending 02/19/23 1856  Labs/Imaging Results for orders placed or performed during the hospital encounter of 02/19/23 (from the past 48 hour(s))  Basic metabolic panel     Status: None   Collection Time: 02/19/23 11:42 AM  Result Value Ref Range   Sodium 141 135 - 145 mmol/L   Potassium 3.7 3.5 - 5.1 mmol/L   Chloride 103 98 - 111 mmol/L   CO2 25 22 - 32 mmol/L   Glucose, Bld 85 70 - 99 mg/dL    Comment: Glucose reference range applies only to samples taken after fasting for at least 8 hours.   BUN 10 8 - 23 mg/dL   Creatinine, Ser 9.56 0.61 - 1.24 mg/dL   Calcium 9.2 8.9 - 21.3 mg/dL   GFR, Estimated >08 >65 mL/min    Comment: (NOTE) Calculated using the CKD-EPI Creatinine Equation (2021)    Anion gap 13 5 - 15    Comment: Performed at [facility], [address] [place], [place] [address]  CBC     Status: None   Collection Time: 02/19/23 11:42 AM  Result Value Ref Range   WBC 5.3 4.0 - 10.5 K/uL   RBC 4.78 4.22 - 5.81 MIL/uL   Hemoglobin 14.9 13.0 - 17.0 g/dL   HCT 62.9 52.8 - 41.3 %   MCV 88.5 80.0 - 100.0 fL   MCH 31.2 26.0 - 34.0 pg   MCHC 35.2 30.0 - 36.0 g/dL   RDW 24.4 01.0 - 27.2 %   Platelets 175 150 - 400 K/uL   nRBC 0.0 0.0 - 0.2 %    Comment: Performed at [facility], [address] [place], [place] [address]  Troponin I (High Sensitivity)     Status: None   Collection Time: 02/19/23 11:42 AM  Result Value Ref Range   Troponin I (High Sensitivity) 4 <18 ng/L    Comment: (NOTE) Elevated high sensitivity troponin I (hsTnI) values and significant  changes across serial measurements may suggest ACS but many other  chronic and acute conditions  are known to elevate hsTnI results.  Refer to the "Links" section for chest pain algorithms and additional  guidance. Performed at [facility], [address], [place], [place] [address]   Troponin I (High Sensitivity)     Status: None   Collection Time: 02/19/23  1:51 PM  Result Value Ref Range   Troponin I (High Sensitivity) 4 <18 ng/L    Comment: (NOTE) Elevated high sensitivity troponin I (hsTnI) values and significant  changes across serial measurements may suggest ACS but many other  chronic and acute conditions are known to elevate hsTnI results.  Refer to the "Links" section for chest pain algorithms and  additional  guidance. Performed at [facility], [address], [place], [place] [address]    DG Chest 2 View  Result Date: 02/19/2023 CLINICAL DATA:  chest pain EXAM: CHEST - 2 VIEW COMPARISON:  CXR 04/30/22 FINDINGS: Status post median sternotomy CABG. No pleural effusion. No pneumothorax. No focal airspace opacity. No radiographically apparent displaced rib fracture. Visualized upper abdomen unremarkable. Vertebral body heights are maintained. IMPRESSION: No focal airspace opacity Electronically Signed   By: Lorenza Cambridge M.D.   On: 02/19/2023 12:14    Pending Labs [employer] (From admission, onward)     Start     Ordered   Signed and Held  CBC  (enoxaparin (LOVENOX)    CrCl >/= 30 ml/min)  Once,   R       Comments: Baseline for enoxaparin therapy IF NOT ALREADY DRAWN.  Notify MD if PLT < 100 K.    Signed and Held   Signed and Held  Creatinine, serum  (enoxaparin (LOVENOX)    CrCl >/= 30 ml/min)  Once,   R       Comments: Baseline for enoxaparin therapy IF NOT ALREADY DRAWN.    Signed and Held   Signed and Held  Creatinine, serum  (enoxaparin (LOVENOX)    CrCl >/= 30 ml/min)  Weekly,   R     Comments: while on enoxaparin therapy    Signed and Held            Vitals/Pain Today's Vitals   02/19/23 1800 02/19/23 1815 02/19/23 1830 02/19/23  1837  BP: 130/80 121/82 114/74   Pulse: 71 65 69   Resp: 16 (!) 25 15   Temp:    98.6 F (37 C)  TempSrc:    Oral  SpO2: 98% 100% 96%   Weight:      Height:      PainSc:        Isolation Precautions No active isolations  Medications Medications  phenytoin (DILANTIN) ER capsule 200 mg (has no administration in time range)    And  phenytoin (DILANTIN) ER capsule 300 mg (300 mg Oral Given 02/19/23 1833)  aspirin chewable tablet 243 mg (243 mg Oral Given 02/19/23 1553)    Mobility walks     Focused Assessments    R Recommendations: See Admitting Provider Note  Report given to:   Additional Notes:

## 2023-02-19 NOTE — ED Triage Notes (Signed)
Pt arrives via POV. Pt reports he began experiencing chest pain that started this morning. Reports associated sob with exertion. Pt took 2 SL nitroglycerin with minimal relief.Pt AxOx4.

## 2023-02-19 NOTE — ED Provider Notes (Signed)
EMERGENCY DEPARTMENT AT [facility] Provider Note   CSN: [id number] Arrival date & time: 02/19/23  1129     History  Chief Complaint  Patient presents with   Chest Pain       Patient is 62 year old male with past medical history of hx of CAD status post CABG 03/2022, seizures, hyperlipidemia, presenting today for an episode of chest pain.  He states it began this morning while getting out of the shower.  He describes it as an initial sharp left-sided chest pain lasting approximately 1 minute.  It was then followed by a dull chest pressure.  He sat down to rest without any improvement.  He took 1 nitro and called his family medicine doctor.  At that time his pain had improved but was still present.  They directed him to take an additional nitro and present to the ED.  He took his second nitro around 10:59 AM.  By 6063, he states his chest pain had resolved.  He denies any associated lightheadedness, shortness of breath, diaphoresis, nausea, or vomiting with this chest pain.  He has no lower extremity pain or edema.  He has no orthopnea or PND.  He has no recent long trips or immobility.  He has been taking his medications as prescribed.  This includes his 81 mg aspirin and Effient this morning.  He has no cough or fevers.  He does note that his most recent stress test was 01/27/2023, where they did note an area of ischemia.  He states that his Imdur was increased from 30 to 60mg   following this appointment.  Home Medications Prior to Admission medications   Medication Sig Start Date End Date Taking? Authorizing Provider  acetaminophen (TYLENOL) 500 MG tablet Take 1,000 mg by mouth daily as needed for mild pain or headache.   Yes [provider]  aspirin EC 81 MG tablet Take 1 tablet (81 mg total) by mouth daily. Swallow whole. 04/21/22  Yes Duke, Roe Rutherford, PA  Cholecalciferol (VITAMIN D-3 PO) Take 5,000 Units by mouth daily.   Yes [provider]   ezetimibe (ZETIA) 10 MG tablet Take 1 tablet (10 mg total) by mouth daily. 06/09/22  Yes Duke, Roe Rutherford, PA  isosorbide mononitrate (IMDUR) 60 MG 24 hr tablet Take 1 tablet (60 mg total) by mouth daily. 02/06/23  Yes Little Ishikawa, MD  lansoprazole (PREVACID) 30 MG capsule TAKE 1 CAPSULE DAILY BEFORE BREAKFAST 05/22/22  Yes Esterwood, Amy S, PA-C  metoprolol succinate (TOPROL-XL) 25 MG 24 hr tablet Take 1 tablet (25 mg total) by mouth daily. 01/15/23  Yes Little Ishikawa, MD  nitroGLYCERIN (NITROSTAT) 0.4 MG SL tablet Place 1 tablet (0.4 mg total) under the tongue every 5 (five) minutes as needed for chest pain. 10/14/22 10/14/23 Yes Little Ishikawa, MD  phenytoin (DILANTIN) 100 MG ER capsule Take 2 capsules (200 mg total) by mouth every morning AND 3 capsules (300 mg total) at bedtime. Take 2 capsules (200 mg) every morning and then take 3 capsules (300 mg) at bedtime. 07/29/22 07/29/23 Yes Lomax, Amy, NP  prasugrel (EFFIENT) 10 MG TABS tablet Take 1 tablet (10 mg total) by mouth daily. 04/21/22  Yes Duke, Roe Rutherford, PA  rosuvastatin (CRESTOR) 40 MG tablet Take 1 tablet (40 mg total) by mouth daily. 10/23/22  Yes Little Ishikawa, MD      Allergies    Nexium [esomeprazole], Zocor [simvastatin], and Gaviscon [alum hydroxide-mag carbonate]    Review of  Systems   Negative except for as noted above in HPI  Physical Exam Updated Vital Signs BP 119/84 (BP Location: Right Arm)   Pulse 68   Temp 98.3 F (36.8 C) (Oral)   Resp 18   Ht 6\' 2"  (1.88 m)   Wt 100.9 kg   SpO2 96%   BMI 28.55 kg/m  Physical Exam Vitals and nursing note reviewed.  Constitutional:      General: He is not in acute distress.    Appearance: He is well-developed.  HENT:     Head: Normocephalic and atraumatic.  Eyes:     Conjunctiva/sclera: Conjunctivae normal.  Cardiovascular:     Rate and Rhythm: Normal rate and regular rhythm.     Heart sounds: No murmur heard. Pulmonary:      Effort: Pulmonary effort is normal. No respiratory distress.     Breath sounds: Normal breath sounds.     Comments: Saturating well on room air Abdominal:     General: There is no distension.     Palpations: Abdomen is soft.     Tenderness: There is no abdominal tenderness. There is no guarding.  Musculoskeletal:        General: No swelling or tenderness.     Cervical back: Neck supple.  Skin:    General: Skin is warm and dry.     Capillary Refill: Capillary refill takes less than 2 seconds.     Findings: No rash.  Neurological:     Mental Status: He is alert and oriented to person, place, and time.     Motor: No weakness.  Psychiatric:        Mood and Affect: Mood normal.     ED Results / Procedures / Treatments   Labs (all labs ordered are listed, but only abnormal results are displayed) Labs Reviewed  BASIC METABOLIC PANEL  CBC  CBC  CREATININE, SERUM  TROPONIN I (HIGH SENSITIVITY)  TROPONIN I (HIGH SENSITIVITY)    EKG EKG Interpretation Date/Time:  Thursday February 19 2023 14:50:01 EDT Ventricular Rate:  56 PR Interval:  168 QRS Duration:  101 QT Interval:  420 QTC Calculation: 406 R Axis:   7  Text Interpretation: Sinus rhythm Confirmed by Gerhard Munch [phone]) on 02/19/2023 3:23:39 PM  Radiology DG Chest 2 View  Result Date: 02/19/2023 CLINICAL DATA:  chest pain EXAM: CHEST - 2 VIEW COMPARISON:  CXR 04/30/22 FINDINGS: Status post median sternotomy CABG. No pleural effusion. No pneumothorax. No focal airspace opacity. No radiographically apparent displaced rib fracture. Visualized upper abdomen unremarkable. Vertebral body heights are maintained. IMPRESSION: No focal airspace opacity Electronically Signed   By: Lorenza Cambridge M.D.   On: 02/19/2023 12:14      Medications Ordered in ED Medications  aspirin EC tablet 81 mg (has no administration in time range)  ezetimibe (ZETIA) tablet 10 mg (has no administration in time range)  isosorbide mononitrate (IMDUR)  24 hr tablet 60 mg (has no administration in time range)  metoprolol succinate (TOPROL-XL) 24 hr tablet 25 mg (has no administration in time range)  rosuvastatin (CRESTOR) tablet 40 mg (has no administration in time range)  pantoprazole (PROTONIX) EC tablet 40 mg (has no administration in time range)  prasugrel (EFFIENT) tablet 10 mg (has no administration in time range)  phenytoin (DILANTIN) ER capsule 200 mg (has no administration in time range)    And  phenytoin (DILANTIN) ER capsule 300 mg (300 mg Oral Given 02/19/23 1833)  nitroGLYCERIN (NITROSTAT) SL tablet 0.4 mg (has  no administration in time range)  acetaminophen (TYLENOL) tablet 650 mg (has no administration in time range)  ondansetron (ZOFRAN) injection 4 mg (has no administration in time range)  enoxaparin (LOVENOX) injection 40 mg (40 mg Subcutaneous Given 02/19/23 2120)  0.9% sodium chloride infusion (has no administration in time range)    Followed by  0.9% sodium chloride infusion (has no administration in time range)  Oral care mouth rinse (has no administration in time range)  aspirin chewable tablet 243 mg (243 mg Oral Given 02/19/23 1553)    ED Course/ Medical Decision Making/ A&P            HEART Score: 5                    Medical Decision Making Problems Addressed: Chest pain, unspecified type: complicated acute illness or injury  Amount and/or Complexity of Data Reviewed External Data Reviewed: radiology and notes. Labs: ordered. Radiology: ordered and independent interpretation performed. ECG/medicine tests: ordered and independent interpretation performed.  Risk OTC drugs. Decision regarding hospitalization.    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  On exam, patient is alert and oriented.  He is in regular and rhythm.  Lungs are clear to auscultation.  He has no lower extremity  edema or tenderness.  Presentation was concerning for ACS given his cardiac history with pain improved by nitro.  Also considered pneumonia, however less likely without fever or cough.  He has no trauma to suggest pneumothorax, rib fracture, or pulmonary contusion, and less likely given symptoms have resolved at this point.  He has equal pulses bilaterally, lower concern for aortic dissection.  He has no lower extremity edema, no hypoxia, no shortness of breath, and no recent immobility, decreased concern for PE.  EKG shows sinus rhythm without any acute ST or T wave abnormalities.  Chest x-ray is unremarkable.  Lab work is also unremarkable including 2 negative troponins.  Patient took an 81 mg aspirin prior to arrival.  He is given 243 mg aspirin.  Continued concern for ACS with unstable angina given his symptoms continued after rest, and further improved with nitro.  Discussed case with cardiology who evaluated patient at bedside.  Agree with plan for admission.  He is hemodynamic stable and appropriate for transfer to the floor.  Final Clinical Impression(s) / ED Diagnoses Final diagnoses:  Chest pain, unspecified type    Rx / DC Orders ED Discharge Orders     None         Rhys Martini, DO 02/20/23 0034    Gerhard Munch, MD 02/22/23 [phone]

## 2023-02-19 NOTE — Plan of Care (Signed)
  Problem: Education: Goal: Understanding of cardiac disease, CV risk reduction, and recovery process will improve Outcome: Progressing Goal: Individualized Educational Video(s) Outcome: Progressing   Problem: Activity: Goal: Ability to tolerate increased activity will improve Outcome: Progressing   

## 2023-02-19 NOTE — Telephone Encounter (Signed)
   Pt c/o of Chest Pain: STAT if active CP, including tightness, pressure, jaw pain, radiating pain to shoulder/upper arm/back, CP unrelieved by Nitro. Symptoms reported of SOB, nausea, vomiting, sweating.  1. Are you having CP right now? Yes; has sharp pain      2. Are you experiencing any other symptoms (ex. SOB, nausea, vomiting, sweating)? No    3. Is your CP continuous or coming and going? continuous   4. Have you taken Nitroglycerin? Took one at 10:08 am this morning   5. How long have you been experiencing CP? This morning    6. If NO CP at time of call then end call with telling Pt to call back or call 911 if Chest pain returns prior to return call from triage team.

## 2023-02-19 NOTE — Telephone Encounter (Signed)
Pt called concerned with CP and symptoms. Pt had sharp pain this morning and took a nitroglycerin at 10:08am which did not resolve the issue. Pt BP at the time of this call was 133 87 and HR 60. Pt is having SOB, tightness, pressure and feels like indigestion.  Had pt take another nitroglycerin at 10:49 am while on the phone. Advised pt to go to the ER since he was still symptomatic. Pt verbalized understanding.

## 2023-02-19 NOTE — H&P (Addendum)
Cardiology Admission History and Physical   Patient ID: Wayne Vang MRN: [id number]; DOB: 1960-09-21   Admission date: 02/19/2023  PCP:  Hannah Beat, MD   [facility] Providers Cardiologist:  Little Ishikawa, MD  Cardiology APP:  Marcelino Duster, [place]  {  Chief Complaint:  chest pain  Patient Profile:   Wayne Vang is a 62 y.o. 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  History of Present Illness:   Wayne Vang is a 62 year old male with the above history who is followed by Dr. Bjorn Pippin.  He was admitted in 02/2022 with chest pain.  High-sensitivity troponin mildly elevated at that time and peaked at 52.  Echo showed normal LV function and no regional wall motion abnormalities. He underwent cardiac catheterization which showed left main equivalent disease with proximal LAD and ostial LCx disease. CT surgery was consulted patient underwent CABG x3 with LIMA to LAD and sequential RSVG to ramus intermediate and OM1 on 03/26/2022. He had expected postop volume overload and acute blood loss anemia but postop course was relatively unremarkable.  He was discharged on 03/31/2022 but presented back to the ED on 04/17/2022 for further evaluation of sudden onset of severe chest pain.  STEMI was called in the field.  While in the ED, he had a VF arrest but ROSC was obtained after 1 shock.  He was taken immediately to the Cath Lab for emergent cardiac catheterization which showed patent grafts with 99% stenosis of the mid OM 2 at the insertion site of the  SVG.  He underwent successful PTCA of this lesion.  Echo at that time showed LVEF of 45-50% with mild hypokinesis of the anterior septal/anterior wall from the base to the apex and a small pericardial effusion. Antiplatelet therapy is complicated due to his phenytoin use for seizure disorder. After discussion with pharmacy, he was treated with Aspirin and Effient. He was readmitted in 04/2022 for unstable angina. Repeat cardiac catheterization showed patent grafts with normal flow to all territories. Continued medical therapy was recommended and he was started on Imdur. Patient reported recurrent chest tightness at visit with Dr. Bjorn Pippin in 09/2022. Repeat Echo and cardiac PET were ordered for further evaluation. Echo showed normalization of EF to 60-65% but I am unable to see the full report. Cardiac PET was completed on 01/28/2023 and showed a reversible defect in the apical to basal lateral locations consistent with ischemia. However, there were no high-risk findings. Imdur was increased with plans for repeat cath if he has persistent chest pain.  Patient presented to the ED today for further evaluation of chest pain. EKG showed sinus bradycardia, rate 55 bpm, with no acute ST/ T wave changes. High-sensitivity troponin negative x2. Chest x-ray showed no acute findings. WBC 5.3, Hgb 14.9, Plts 175. Na 141, K 3.7, Glucose 85, BUN 10, Cr 0.86. Cardiology consulted for further evaluation.  At the time of this evaluation, patient is resting comfortably in no acute distress. He reports a sudden episode of sharp 6/10 chest pain this morning that he states he could feel "moving" across  his chest pain. This last a little over a minute and then resolved on its own. He states he almost felt like a "gas bubble" like when "swallow a coke and it goes down the wrong way." This was different than the prior chest tightness that he has been having with exertion. He has not noticed a huge difference in this after the increase in  Imdur. He is wondering if this could be residual musculoskeletal pain from his sternotomy. It is reproducible with palpation of his chest wall but there may be some musculoskeletal component. He also reports dyspnea on exertion and bendopnea since his CABG but it sounds like this is stable. He denies any new or worsening shortness of breath. No orthopnea, PND, or edema. No palpitations. He reports some mild lightheadedness when working out in the heat and when standing too quickly. However, no syncope. He has had some nasal congestion recently but no other recent fevers. He is compliant with all of his medication and is tolerating them well. No abnormal bleeding on DAPT.  Past Medical History:  Diagnosis Date   Acute MI, 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   Esophageal reflux    HFrEF (heart failure with reduced ejection fraction) (HCC) 04/19/2022   Hiatal hernia    Hyperlipidemia    Low back pain    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   Seizure disorder (HCC)    Skin cancer    "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"   Tremor     Past Surgical History:  Procedure Laterality Date   COLONOSCOPY     CORONARY ARTERY BYPASS GRAFT N/A 03/26/2022   Procedure: CORONARY ARTERY BYPASS GRAFTING (CABG) X THREE, USING LEFT INTERNAL MAMMARY ARTERY AND RIGHT LEG GREATER SAPHENOUS VEIN;  Surgeon: Eugenio Hoes, MD;  Location: [facility] OR;  Service: Open Heart Surgery;  Laterality: N/A;   CORONARY BALLOON ANGIOPLASTY N/A 04/17/2022   Procedure: CORONARY BALLOON ANGIOPLASTY;  Surgeon: Corky Crafts, MD;  Location: [facility] INVASIVE CV LAB;  Service: Cardiovascular;  Laterality: N/A;   INGUINAL HERNIA REPAIR Right 12/06/2013   Procedure:  REPAIR RIGHT INGUINAL HERNIA;  Surgeon: Clovis Pu. Cornett, MD;  Location: [facility];  Service: General;  Laterality: Right;   INGUINAL HERNIA REPAIR Left 07/21/2013   INSERTION OF MESH N/A 12/06/2013   Procedure: INSERTION OF MESH;  Surgeon: Clovis Pu. Cornett, MD;  Location: [facility];   Service: General;  Laterality: N/A;   LEFT HEART CATH AND CORONARY ANGIOGRAPHY N/A 03/20/2022   Procedure: LEFT HEART CATH AND CORONARY ANGIOGRAPHY;  Surgeon: Corky Crafts, MD;  Location: [facility] INVASIVE CV LAB;  Service: Cardiovascular;  Laterality: N/A;   LEFT HEART CATH AND CORONARY ANGIOGRAPHY N/A 04/17/2022   Procedure: LEFT HEART CATH AND CORONARY ANGIOGRAPHY;  Surgeon: Corky Crafts, MD;  Location: [facility] INVASIVE CV LAB;  Service: Cardiovascular;  Laterality: N/A;   LEFT HEART CATH AND CORS/GRAFTS ANGIOGRAPHY N/A 04/17/2022   Procedure: LEFT HEART CATH AND CORS/GRAFTS ANGIOGRAPHY;  Surgeon: Corky Crafts, MD;  Location: [facility] INVASIVE CV LAB;  Service: Cardiovascular;  Laterality: N/A;   LEFT HEART CATH AND CORS/GRAFTS ANGIOGRAPHY N/A 05/01/2022   Procedure: LEFT HEART CATH AND CORS/GRAFTS ANGIOGRAPHY;  Surgeon: Corky Crafts, MD;  Location: [facility] INVASIVE CV LAB;  Service: Cardiovascular;  Laterality: N/A;   POLYPECTOMY     TEE WITHOUT CARDIOVERSION N/A 03/26/2022   Procedure: TRANSESOPHAGEAL ECHOCARDIOGRAM (TEE);  Surgeon: Eugenio Hoes, MD;  Location: [facility] OR;  Service: Open Heart Surgery;  Laterality: N/A;  Medications Prior to Admission: Prior to Admission medications   Medication Sig Start Date End Date Taking? Authorizing Provider  acetaminophen (TYLENOL) 500 MG tablet Take 1,000 mg by mouth daily as needed for mild pain or headache.    [provider]  aspirin EC 81 MG tablet Take 1 tablet (81 mg total) by mouth daily. Swallow whole. 04/21/22   [facility], Roe Rutherford, PA  Cholecalciferol (VITAMIN D-3 PO) Take 5,000 Units by mouth daily.    [provider]  ezetimibe (ZETIA) 10 MG tablet Take 1 tablet (10 mg total) by mouth daily. 06/09/22   [facility], Roe Rutherford, PA  isosorbide mononitrate (IMDUR) 60 MG 24 hr tablet Take 1 tablet (60 mg total) by mouth daily. 02/06/23   Little Ishikawa, MD  lansoprazole (PREVACID) 30 MG capsule TAKE 1 CAPSULE  DAILY BEFORE BREAKFAST 05/22/22   Esterwood, Amy S, PA-C  metoprolol succinate (TOPROL-XL) 25 MG 24 hr tablet Take 1 tablet (25 mg total) by mouth daily. 01/15/23   Little Ishikawa, MD  nitroGLYCERIN (NITROSTAT) 0.4 MG SL tablet Place 1 tablet (0.4 mg total) under the tongue every 5 (five) minutes as needed for chest pain. 10/14/22 10/14/23  Little Ishikawa, MD  phenytoin (DILANTIN) 100 MG ER capsule Take 2 capsules (200 mg total) by mouth every morning AND 3 capsules (300 mg total) at bedtime. Take 2 capsules (200 mg) every morning and then take 3 capsules (300 mg) at bedtime. 07/29/22 07/29/23  Lomax, Amy, NP  prasugrel (EFFIENT) 10 MG TABS tablet Take 1 tablet (10 mg total) by mouth daily. 04/21/22   [facility], Roe Rutherford, PA  rosuvastatin (CRESTOR) 40 MG tablet Take 1 tablet (40 mg total) by mouth daily. 10/23/22   Little Ishikawa, MD     Allergies:    Allergies  Allergen Reactions   Nexium [Esomeprazole] Other (See Comments)    Abdominal pain   Zocor [Simvastatin] Other (See Comments)    Ineffective     Gaviscon [Alum Hydroxide-Mag Carbonate] Other (See Comments)    Medication interaction only    Social History:   Social History   Socioeconomic History   Marital status: Married    Spouse name: Not on file   Number of children: 1   Years of education: Not on file   Highest education level: Not on file  Occupational History   Occupation: Shop Nurse, mental health: [employer]    Comment: [employer]  Tobacco Use   Smoking status: Former    Current packs/day: 0.00    Types: Cigarettes    Quit date: 07/21/2002    Years since quitting: 20.5    Passive exposure: Never   Smokeless tobacco: Never  Vaping Use   Vaping status: Never Used  Substance and Sexual Activity   Alcohol use: Yes    Comment: socially   Drug use: No   Sexual activity: Not on file  Other Topics Concern   Not on file  Social History Narrative      He has been married  31+ years.   Caffeine Use: He drinks coke, tea, and coffee.   Truck Curator   Social Determinants of Health   Financial Resource Strain: Not on file  Food Insecurity: No Food Insecurity (03/29/2022)   Hunger Vital Sign    Worried About Running Out of Food in the Last Year: Never true    Ran Out of Food in the Last Year: Never true  Transportation Needs: No Transportation  Needs (03/29/2022)   PRAPARE - Administrator, Civil Service (Medical): No    Lack of Transportation (Non-Medical): No  Physical Activity: Not on file  Stress: Not on file  Social Connections: Not on file  Intimate Partner Violence: Not At Risk (03/29/2022)   Humiliation, Afraid, Rape, and Kick questionnaire    Fear of Current or Ex-Partner: No    Emotionally Abused: No    Physically Abused: No    Sexually Abused: No    Family History:   The patient's family history includes Alcohol abuse in his brother; Alzheimer's disease in his mother; Cancer in his father; Colon polyps in his sister; Stroke in his sister. There is no history of Colon cancer, Esophageal cancer, Stomach cancer, or Rectal cancer.    ROS:  Please see the history of present illness.  All other ROS reviewed and negative.     Physical Exam/Data:   Vitals:   02/19/23 1615 02/19/23 1630 02/19/23 1645 02/19/23 1700  BP: 123/85 124/86 (!) 126/90 (!) 109/97  Pulse: 61 (!) 53 62 (!) 59  Resp: 16 12 15 14   Temp:      TempSrc:      SpO2: 98% 97% 97% 98%  Weight:      Height:       No intake or output data in the 24 hours ending 02/19/23 1745    02/19/2023   11:39 AM 02/06/2023    7:57 AM 01/14/2023    4:15 PM  Last 3 Weights  Weight (lbs) 227 lb 227 lb 4.8 oz 224 lb 9.6 oz  Weight (kg) 102.967 kg 103.103 kg 101.878 kg     Body mass index is 29.15 kg/m.  General: 62 y.o. male resting comfortably in no acute distress. HEENT: Normocephalic and atraumatic. Sclera clear.  Neck: Supple. No carotid bruits. No JVD. Heart: RRR. Distinct S1  and S2. No murmurs, gallops, or rubs.  Lungs: No increased work of breathing. Clear to ausculation bilaterally. No wheezes, rhonchi, or rales.  Abdomen: Soft, non-distended, and non-tender to palpation.  Extremities: No lower extremity edema.    Skin: Warm and dry. Neuro: Alert and oriented x3. No focal deficits. Psych: Normal affect. Responds appropriately.    EKG:  The ECG that was done was personally reviewed and demonstrates sinus bradycardia with no acute ST/ T wave changes.  Relevant CV Studies:  Left Cardiac Catheterization 05/01/2022:   Prox LAD lesion is 90% stenosed.  LIMA to LAD is patent.   Ost Cx to Prox Cx lesion is 75% stenosed.  SVG to ramus and OM is patent.  Distal part of SVG has decreased in size to better match the OM2 vessel.   1st Mrg-1 lesion is 25% stenosed.   Non-stenotic 1st Mrg-2 lesion was previously treated.  Insertion site is widely patent.   The left ventricular systolic function is normal.   LV end diastolic pressure is normal.   The left ventricular ejection fraction is 55-65% by visual estimate.   There is no aortic valve stenosis.   Continue medical therapy.  Flow to all territories appears normal.   Distal part of SVG has decreased in size and better matches the OM2 vessel. _______________   Cardiac PET 01/27/2023:   Findings are consistent with a medium size defect with mild-moderate ischemia in the lateral wall with normal global myocardial blood flow reserve. MBF in the LCx distribution is normal, but lower relative to the other coronary territories. The study is overall low risk given  augmentation of EF, no TID, grossly normal MBFR.   LV perfusion is abnormal. There is evidence of ischemia. There is no evidence of infarction. Defect 1: There is a medium defect with mild-moderate reduction in uptake present in the apical to basal lateral location(s) that is reversible. There is normal wall motion in the defect area. Consistent with ischemia.   Rest  left ventricular function is normal. Rest EF: 68 %. Stress left ventricular function is normal. Stress EF: 71 %. End diastolic cavity size is normal. End systolic cavity size is normal. No evidence of transient ischemic dilation (TID) noted.   Myocardial blood flow was computed to be 0.89ml/g/min at rest and 2.29ml/g/min at stress. Global myocardial blood flow reserve was 2.86 and was normal.   Coronary calcium assessment not performed due to prior revascularization.  Laboratory Data:  High Sensitivity Troponin:   Recent Labs  Lab 02/19/23 1142 02/19/23 1351  TROPONINIHS 4 4      Chemistry Recent Labs  Lab 02/19/23 1142  NA 141  K 3.7  CL 103  CO2 25  GLUCOSE 85  BUN 10  CREATININE 0.86  CALCIUM 9.2  GFRNONAA >60  ANIONGAP 13    No results for input(s): "PROT", "ALBUMIN", "AST", "ALT", "ALKPHOS", "BILITOT" in the last 168 hours. Lipids No results for input(s): "CHOL", "TRIG", "HDL", "LABVLDL", "LDLCALC", "CHOLHDL" in the last 168 hours. Hematology Recent Labs  Lab 02/19/23 1142  WBC 5.3  RBC 4.78  HGB 14.9  HCT 42.3  MCV 88.5  MCH 31.2  MCHC 35.2  RDW 12.3  PLT 175   Thyroid No results for input(s): "TSH", "FREET4" in the last 168 hours. BNPNo results for input(s): "BNP", "PROBNP" in the last 168 hours.  DDimer No results for input(s): "DDIMER" in the last 168 hours.   Radiology/Studies:  DG Chest 2 View  Result Date: 02/19/2023 CLINICAL DATA:  chest pain EXAM: CHEST - 2 VIEW COMPARISON:  CXR 04/30/22 FINDINGS: Status post median sternotomy CABG. No pleural effusion. No pneumothorax. No focal airspace opacity. No radiographically apparent displaced rib fracture. Visualized upper abdomen unremarkable. Vertebral body heights are maintained. IMPRESSION: No focal airspace opacity Electronically Signed   By: Lorenza Cambridge M.D.   On: 02/19/2023 12:14     Assessment and Plan:   Unstable Angina CAD Patient has a history of CAD s/p CABG x3 (LIMA-LAD and sequential  RSVG to ramus intermediate and OM1 on 03/26/2022. He unfortunately had a STEMI on 04/18/2023 due to early thrombosis of the insertion site of SVG to OM2 complicated by VF arrest and underwent successful PCI with angioplasty to this lesion. Subsequent cath in 04/2022 showed patent grafts. Continued medical therapy was recommended at that time and he was started on Imdur. Recent cardiac PET on 01/28/2023 for recurrent chest pain showed a reversible defect in the apical to basal lateral locations consistent with ischemia. However, there were no high-risk findings. Imdur was increased with plans for repeat cath if he has persistent chest pain. He now presents with sudden onset of sharp chest pain that lasted for about 1 minute. EKG showed no acute ischemic changes and high-sensitivity troponin negative x2. - He has had no recurrent sharp chest pain since arrival to the ED. He states this felt different than his prior chest tightness. He has not noticed any significant difference in his chest tightness with the increase in Imdur. He is currently chest pain free. - Continue Imdur 60mg  daily and Toprol-XL 25mg  daily. - Continue DAPT with Aspirin and  Effient. Not on Brilinta due to interaction with Phenytoin. - Continue Crestor 40mg  daily and Zetia 10mg  daily. - The sharp episode of chest pain that he came in with sounds atypical. However, he has not noticed a significant difference in his intermittent chest tightness since increasing the Imdur. Given recent abnormal cardiac PET, will plan for left cardiac catheterization tomorrow. Given patient is currently chest pain free and enzymes are negative, will hold off on IV Heparin.   The patient understands that risks include but are not limited to stroke (1 in 1000), death (1 in 1000), kidney failure [usually temporary] (1 in 500), bleeding (1 in 200), allergic reaction [possibly serious] (1 in 200), and agrees to proceed.    VF Arrest Occurred in setting of STEMI as  described due to early thrombosis of the insertion site of SVG to OM2 after CABG in 03/2022. No recurrence.  - Continue Toprol-XL 25mg  daily.  Chronic HFmrEF  Echo in 03/2022 after STEMI showed LVEF of 45-50%  mild hypokinesis of the anterior septal/anterior wall from the base to the apex. However, EF has since normalized. Recent Echo in 10/2022 showed LVEF of 60-65%. - Euvolemic on exam. - Continue Toprol-XL as above.  Hyperlipidemia LDL in 06/2022 67.  - Continue Crestor 40mg  daily and Zetia 10mg  daily.  Seizure Disorder - Continue home Phenytoin 200mg  in the morning and 300mg  in the evening.   Risk Assessment/Risk Scores:   TIMI Risk Score for Unstable Angina or Non-ST Elevation MI:   The patient's TIMI risk score is 3, which indicates a 13% risk of all cause mortality, new or recurrent myocardial infarction or need for urgent revascularization in the next 14 days.{   New York Heart Association (NYHA) Functional Class NYHA Class II-III  Code Status: Full Code  Severity of Illness: The appropriate patient status for this patient is OBSERVATION. Observation status is judged to be reasonable and necessary in order to provide the required intensity of service to ensure the patient's safety. The patient's presenting symptoms, physical exam findings, and initial radiographic and laboratory data in the context of their medical condition is felt to place them at decreased risk for further clinical deterioration. Furthermore, it is anticipated that the patient will be medically stable for discharge from the hospital within 2 midnights of admission.    For questions or updates, please contact [facility] Please consult [email or web address] for contact info under     Signed, Corrin Parker, PA-C  02/19/2023 5:45 PM   Patient seen and examined with Marjie Skiff, PA-C.  Agree as above, with the following exceptions and changes as noted below.  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  Patient presents today after having a sharp central chest pain which at first he thought was related to a gas bubble, but on further interview tells me this was a similar discomfort to what brought him in initially prior to coronary artery bypass grafting.  Patient's wife is in the room and provides collaborative history, we reviewed patient's history which is significant for CABG in September with early anastomosis graft thrombosis requiring balloon angioplasty of the anastomosis of the SVG to OM VF arrest in the setting of STEMI and mildly reduced ejection fraction with normalization.  Patient was  recently referred for cardiac PET stress test which I personally interpreted.  There was evidence on perfusion images of lateral ischemia however myocardial blood flow reserve was felt to be normal, though this is always challenging to interpret in the setting of prior bypass surgery.  Nevertheless with EF augmentation, no 3 times daily, and normal flows, overall felt to be low risk.  However with continued chest pain patient and I reviewed that it may be best to redefine coronary anatomy particularly given the complexity of his prior coronary disease.  Gen: NAD, CV: RRR, no murmurs, Lungs: clear, Abd: soft, Extrem: Warm, well perfused, no edema, Neuro/Psych: alert and oriented x 3, normal mood and affect. All available labs, radiology testing, previous records reviewed.  Plan for cardiac catheterization in the morning, troponins are negative suggesting this is not ACS but may represent stable/accelerating angina.  EKG without ischemic changes, EF on PET stress test was normal.  Laboratory studies permissible for cardiac catheterization tomorrow.  Patient consented with  wife in the room they agree to proceed.  Consent noted below.  INFORMED CONSENT: I have reviewed the risks, indications, and alternatives to cardiac catheterization, possible angioplasty, and stenting with the patient. Risks include but are not limited to bleeding, infection, vascular injury, stroke, myocardial infarction, arrhythmia, kidney injury, radiation-related injury in the case of prolonged fluoroscopy use, emergency cardiac surgery, and death. The patient understands the risks of serious complication is 1-2 in 1000 with diagnostic cardiac cath and 1-2% or less with angioplasty/stenting.    Parke Poisson, MD 02/19/23 6:09 PM

## 2023-02-20 ENCOUNTER — Encounter: Payer: Self-pay | Admitting: Cardiology

## 2023-02-20 ENCOUNTER — Other Ambulatory Visit (HOSPITAL_COMMUNITY): Payer: Self-pay

## 2023-02-20 ENCOUNTER — Encounter (HOSPITAL_COMMUNITY)
Admission: EM | Disposition: A | Discharge status: Home / Self Care | Payer: Self-pay | Source: Home / Self Care | Attending: Emergency Medicine

## 2023-02-20 DIAGNOSIS — I259 Chronic ischemic heart disease, unspecified: Secondary | ICD-10-CM

## 2023-02-20 DIAGNOSIS — R079 Chest pain, unspecified: Secondary | ICD-10-CM | POA: Diagnosis not present

## 2023-02-20 DIAGNOSIS — I25110 Atherosclerotic heart disease of native coronary artery with unstable angina pectoris: Secondary | ICD-10-CM | POA: Diagnosis not present

## 2023-02-20 DIAGNOSIS — Z7982 Long term (current) use of aspirin: Secondary | ICD-10-CM | POA: Diagnosis not present

## 2023-02-20 DIAGNOSIS — Z85828 Personal history of other malignant neoplasm of skin: Secondary | ICD-10-CM | POA: Diagnosis not present

## 2023-02-20 DIAGNOSIS — I5032 Chronic diastolic (congestive) heart failure: Secondary | ICD-10-CM | POA: Insufficient documentation

## 2023-02-20 DIAGNOSIS — Z951 Presence of aortocoronary bypass graft: Secondary | ICD-10-CM | POA: Diagnosis not present

## 2023-02-20 DIAGNOSIS — R569 Unspecified convulsions: Secondary | ICD-10-CM

## 2023-02-20 HISTORY — PX: LEFT HEART CATH AND CORS/GRAFTS ANGIOGRAPHY: CATH118250

## 2023-02-20 LAB — CBC
HCT: 38.2 % — ABNORMAL LOW (ref 39.0–52.0)
HCT: 42.6 % (ref 39.0–52.0)
Hemoglobin: 13.6 g/dL (ref 13.0–17.0)
Hemoglobin: 15 g/dL (ref 13.0–17.0)
MCH: 31.3 pg (ref 26.0–34.0)
MCH: 30.4 pg (ref 26.0–34.0)
MCHC: 35.6 g/dL (ref 30.0–36.0)
MCHC: 35.2 g/dL (ref 30.0–36.0)
MCV: 88 fL (ref 80.0–100.0)
MCV: 86.2 fL (ref 80.0–100.0)
Platelets: 145 10*3/uL — ABNORMAL LOW (ref 150–400)
Platelets: 164 10*3/uL (ref 150–400)
RBC: 4.34 MIL/uL (ref 4.22–5.81)
RBC: 4.94 MIL/uL (ref 4.22–5.81)
RDW: 12.2 % (ref 11.5–15.5)
RDW: 12.2 % (ref 11.5–15.5)
WBC: 5.6 10*3/uL (ref 4.0–10.5)
WBC: 5 10*3/uL (ref 4.0–10.5)
nRBC: 0 % (ref 0.0–0.2)
nRBC: 0 % (ref 0.0–0.2)

## 2023-02-20 LAB — CREATININE, SERUM
Creatinine, Ser: 0.95 mg/dL (ref 0.61–1.24)
Creatinine, Ser: 0.99 mg/dL (ref 0.61–1.24)
GFR, Estimated: >60 mL/min (ref >60)
GFR, Estimated: >60 mL/min (ref >60)

## 2023-02-20 SURGERY — LEFT HEART CATH AND CORS/GRAFTS ANGIOGRAPHY
Anesthesia: LOCAL

## 2023-02-20 MED ORDER — SODIUM CHLORIDE 0.9 % WEIGHT BASED INFUSION
1.0000 mL/kg/h | INTRAVENOUS | Status: AC
  Administered 2023-02-20: 1 mL/kg/h via INTRAVENOUS

## 2023-02-20 MED ORDER — FENTANYL CITRATE (PF) 100 MCG/2ML IJ SOLN
INTRAMUSCULAR | Status: AC
  Filled 2023-02-20: qty 2

## 2023-02-20 MED ORDER — SODIUM CHLORIDE 0.9% FLUSH: 3.0000 mL | Freq: Two times a day (BID) | INTRAVENOUS | Status: DC

## 2023-02-20 MED ORDER — VERAPAMIL HCL 2.5 MG/ML IV SOLN
INTRAVENOUS | Status: AC
  Filled 2023-02-20: qty 2

## 2023-02-20 MED ORDER — MIDAZOLAM HCL 2 MG/2ML IJ SOLN
INTRAMUSCULAR | Status: AC
  Filled 2023-02-20: qty 2

## 2023-02-20 MED ORDER — VERAPAMIL HCL 2.5 MG/ML IV SOLN
INTRAVENOUS | Status: DC | PRN
  Administered 2023-02-20: 10 mL via INTRA_ARTERIAL

## 2023-02-20 MED ORDER — SODIUM CHLORIDE 0.9% FLUSH: 3.0000 mL | INTRAVENOUS | Status: DC | PRN

## 2023-02-20 MED ORDER — MIDAZOLAM HCL 2 MG/2ML IJ SOLN
INTRAMUSCULAR | Status: DC | PRN
  Administered 2023-02-20: 2 mg via INTRAVENOUS

## 2023-02-20 MED ORDER — LIDOCAINE HCL (PF) 1 % IJ SOLN
INTRAMUSCULAR | Status: DC | PRN
  Administered 2023-02-20: 2 mL

## 2023-02-20 MED ORDER — ENOXAPARIN SODIUM 40 MG/0.4ML IJ SOSY: 40.0000 mg | PREFILLED_SYRINGE | INTRAMUSCULAR | Status: DC

## 2023-02-20 MED ORDER — ISOSORBIDE MONONITRATE ER 60 MG PO TB24
90.0000 mg | ORAL_TABLET | Freq: Every day | ORAL | 3 refills | Status: DC
  Filled 2023-02-20: qty 90, 60d supply, fill #0

## 2023-02-20 MED ORDER — FENTANYL CITRATE (PF) 100 MCG/2ML IJ SOLN
INTRAMUSCULAR | Status: DC | PRN
  Administered 2023-02-20: 25 ug via INTRAVENOUS

## 2023-02-20 MED ORDER — HEPARIN SODIUM (PORCINE) 1000 UNIT/ML IJ SOLN
INTRAMUSCULAR | Status: DC | PRN
  Administered 2023-02-20: 5000 [IU] via INTRAVENOUS

## 2023-02-20 MED ORDER — HEPARIN (PORCINE) IN NACL 1000-0.9 UT/500ML-% IV SOLN
INTRAVENOUS | Status: DC | PRN
  Administered 2023-02-20 (×2): 500 mL

## 2023-02-20 MED ORDER — PANTOPRAZOLE SODIUM 40 MG PO TBEC
40.0000 mg | DELAYED_RELEASE_TABLET | Freq: Every day | ORAL | 3 refills | Status: DC
  Filled 2023-02-20: qty 30, 30d supply, fill #0

## 2023-02-20 MED ORDER — IOHEXOL 350 MG/ML SOLN
INTRAVENOUS | Status: DC | PRN
  Administered 2023-02-20: 60 mL

## 2023-02-20 MED ORDER — HEPARIN SODIUM (PORCINE) 1000 UNIT/ML IJ SOLN
INTRAMUSCULAR | Status: AC
  Filled 2023-02-20: qty 10

## 2023-02-20 MED ORDER — LIDOCAINE HCL (PF) 1 % IJ SOLN
INTRAMUSCULAR | Status: AC
  Filled 2023-02-20: qty 30

## 2023-02-20 MED ORDER — SODIUM CHLORIDE 0.9 % IV SOLN: 250.0000 mL | INTRAVENOUS | Status: DC | PRN

## 2023-02-20 SURGICAL SUPPLY — 11 items
CATH INFINITI 5 FR IM (CATHETERS) IMPLANT
CATH INFINITI 5FR AL1 (CATHETERS) IMPLANT
CATH INFINITI 5FR MULTPACK ANG (CATHETERS) IMPLANT
DEVICE RAD COMP TR BAND LRG (VASCULAR PRODUCTS) IMPLANT
GLIDESHEATH SLEND SS 6F .021 (SHEATH) IMPLANT
GUIDEWIRE INQWIRE 1.5J.035X260 (WIRE) IMPLANT
INQWIRE 1.5J .035X260CM (WIRE) ×1
PACK CARDIAC CATHETERIZATION (CUSTOM PROCEDURE TRAY) ×1 IMPLANT
SET ATX-X65L (MISCELLANEOUS) IMPLANT
TRANSDUCER W/STOPCOCK (MISCELLANEOUS) ×1 IMPLANT
TUBING CIL FLEX 10 FLL-RA (TUBING) ×1 IMPLANT

## 2023-02-20 NOTE — Interval H&P Note (Signed)
History and Physical Interval Note:  02/20/2023 8:50 AM  Wayne Vang  has presented today for surgery, with the diagnosis of unstable angina.  The various methods of treatment have been discussed with the patient and family. After consideration of risks, benefits and other options for treatment, the patient has consented to  Procedure(s): LEFT HEART CATH AND CORS/GRAFTS ANGIOGRAPHY (N/A) as a surgical intervention.  The patient's history has been reviewed, patient examined, no change in status, stable for surgery.  I have reviewed the patient's chart and labs.  Questions were answered to the patient's satisfaction.     Theron Arista [facility] 02/20/2023 8:50 AM'

## 2023-02-20 NOTE — Progress Notes (Addendum)
Rounding Note    Patient Name: Wayne Vang Date of Encounter: 02/20/2023  [facility] Cardiologist: Little Ishikawa, MD   Subjective   Not have any chest pain currently.  Tends to be more exertional.  No shortness of breath.  Plans for cardiac catheterization today.  Inpatient Medications    Scheduled Meds:  aspirin EC  81 mg Oral Daily   enoxaparin (LOVENOX) injection  40 mg Subcutaneous Q24H   ezetimibe  10 mg Oral Daily   isosorbide mononitrate  60 mg Oral Daily   metoprolol succinate  25 mg Oral Daily   pantoprazole  40 mg Oral Daily   phenytoin  200 mg Oral Daily   And   phenytoin  300 mg Oral QHS   prasugrel  10 mg Oral Daily   rosuvastatin  40 mg Oral Daily   Continuous Infusions:  sodium chloride 1 mL/kg/hr (02/20/23 0611)   PRN Meds: acetaminophen, nitroGLYCERIN, ondansetron (ZOFRAN) IV, mouth rinse   Vital Signs    Vitals:   02/19/23 1837 02/19/23 1900 02/19/23 2029 02/20/23 0559  BP:  113/75 119/84 119/80  Pulse:  72 68 61  Resp:  19 18 18   Temp: 98.6 F (37 C)  98.3 F (36.8 C) 98.5 F (36.9 C)  TempSrc: Oral  Oral Oral  SpO2:  97% 96%   Weight:   100.9 kg   Height:   6\' 2"  (1.88 m)     Intake/Output Summary (Last 24 hours) at 02/20/2023 0756 Last data filed at 02/20/2023 0425 Gross per 24 hour  Intake 0 ml  Output --  Net 0 ml      02/19/2023    8:29 PM 02/19/2023   11:39 AM 02/06/2023    7:57 AM  Last 3 Weights  Weight (lbs) 222 lb 6.4 oz 227 lb 227 lb 4.8 oz  Weight (kg) 100.88 kg 102.967 kg 103.103 kg      Telemetry    Sinus bradycardia heart rate 50s- Personally Reviewed  ECG    Sinus bradycardia with no acute ST-T wave changes.- Personally Reviewed  Physical Exam   GEN: No acute distress.   Neck: No JVD Cardiac: RRR, no murmurs, rubs, or gallops.  Respiratory: Clear to auscultation bilaterally. GI: Soft, nontender, non-distended  MS: No edema; No deformity. Neuro:  Nonfocal  Psych: Normal affect    Labs    High Sensitivity Troponin:   Recent Labs  Lab 02/19/23 1142 02/19/23 1351  TROPONINIHS 4 4     Chemistry Recent Labs  Lab 02/19/23 1142 02/19/23 2344  NA 141  --   K 3.7  --   CL 103  --   CO2 25  --   GLUCOSE 85  --   BUN 10  --   CREATININE 0.86 0.95  CALCIUM 9.2  --   GFRNONAA >60 >60  ANIONGAP 13  --     Lipids No results for input(s): "CHOL", "TRIG", "HDL", "LABVLDL", "LDLCALC", "CHOLHDL" in the last 168 hours.  Hematology Recent Labs  Lab 02/19/23 1142 02/19/23 2344  WBC 5.3 5.6  RBC 4.78 4.34  HGB 14.9 13.6  HCT 42.3 38.2*  MCV 88.5 88.0  MCH 31.2 31.3  MCHC 35.2 35.6  RDW 12.3 12.2  PLT 175 145*   Thyroid No results for input(s): "TSH", "FREET4" in the last 168 hours.  BNPNo results for input(s): "BNP", "PROBNP" in the last 168 hours.  DDimer No results for input(s): "DDIMER" in the last 168 hours.  Radiology    DG Chest 2 View  Result Date: 02/19/2023 CLINICAL DATA:  chest pain EXAM: CHEST - 2 VIEW COMPARISON:  CXR 04/30/22 FINDINGS: Status post median sternotomy CABG. No pleural effusion. No pneumothorax. No focal airspace opacity. No radiographically apparent displaced rib fracture. Visualized upper abdomen unremarkable. Vertebral body heights are maintained. IMPRESSION: No focal airspace opacity Electronically Signed   By: Lorenza Cambridge M.D.   On: 02/19/2023 12:14    Cardiac Studies   Left Cardiac Catheterization 05/01/2022:   Prox LAD lesion is 90% stenosed.  LIMA to LAD is patent.   Ost Cx to Prox Cx lesion is 75% stenosed.  SVG to ramus and OM is patent.  Distal part of SVG has decreased in size to better match the OM2 vessel.   1st Mrg-1 lesion is 25% stenosed.   Non-stenotic 1st Mrg-2 lesion was previously treated.  Insertion site is widely patent.   The left ventricular systolic function is normal.   LV end diastolic pressure is normal.   The left ventricular ejection fraction is 55-65% by visual estimate.   There is no aortic  valve stenosis.   Continue medical therapy.  Flow to all territories appears normal.   Distal part of SVG has decreased in size and better matches the OM2 vessel. _______________     Cardiac PET 01/27/2023:   Findings are consistent with a medium size defect with mild-moderate ischemia in the lateral wall with normal global myocardial blood flow reserve. MBF in the LCx distribution is normal, but lower relative to the other coronary territories. The study is overall low risk given augmentation of EF, no TID, grossly normal MBFR.   LV perfusion is abnormal. There is evidence of ischemia. There is no evidence of infarction. Defect 1: There is a medium defect with mild-moderate reduction in uptake present in the apical to basal lateral location(s) that is reversible. There is normal wall motion in the defect area. Consistent with ischemia.   Rest left ventricular function is normal. Rest EF: 68 %. Stress left ventricular function is normal. Stress EF: 71 %. End diastolic cavity size is normal. End systolic cavity size is normal. No evidence of transient ischemic dilation (TID) noted.   Myocardial blood flow was computed to be 0.50ml/g/min at rest and 2.45ml/g/min at stress. Global myocardial blood flow reserve was 2.86 and was normal.   Coronary calcium assessment not performed due to prior revascularization.  Patient Profile     62 y.o. 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  He was admitted on 02/19/2023 for ongoing complaints of chest pain progressively gotten worse since his last admission.  Shortly after CABG on 04/17/2022 patient had  early thrombosis of insertion site with SVG to OM 2 which was complicated by V-fib arrest and underwent successful balloon angioplasty to this lesion.  He had a subsequent catheterization in October 2023 that showed patent grafts.  Continue medical therapy was recommended at that time and he was started on Imdur.  He had recent cardiac PET on 01/28/2023 for recurrent chest pain that showed lateral ischemia however myocardial blood flow reserve was felt to be normal.  Overall study  felt to be low risk.  He has had continued exertional chest pain/heaviness and has required more nitroglycerin recently which provides relief.  He states that since his last procedure last year he has had progressive chest pain that has worsened in the last few weeks.  Assessment & Plan    Unstable angina CAD s/p CABG x3 (LIMA-LAD and sequential RSVG to ramus intermediate and OM1 on 03/26/2022) Patient now presenting with worsening complaints of chest pain with exertion.  Describes as heaviness in has accompanied slight shortness of breath.  EKG showing no acute ischemic changes and negative troponins x 2.  Will plan for cardiac catheterization for definitive evaluation. Continue DAPT with aspirin and Effient.  Reports 100% compliancy with this.  No Brilinta due to phenytoin. Continue Imdur 60 mg daily, Toprol XL 25 mg, rosuvastatin 40 mg, Zetia 10 mg  History of HFmrEF Echocardiogram in September 2023 after STEMI showed moderately reduced EF of 45 to 50%.  However this has since normalized based off echocardiogram in April 2024 that shows EF of 60-65.  Not able to view full results of this.  Consider obtaining repeat echocardiogram so we have full report of normalization of EF. No issues with volume or SOB. Euvolemic.   V-fib arrest In the setting of STEMI early thrombosis of insertion site as above.  No recurrences  Hyperlipidemia LDL summer 06/2022.  Continue Crestor 40 mg daily and Zetia 10 mg  Seizure  disorder Continue home phenytoin 200 mg and 300 mg in the evening     For questions or updates, please contact  [facility] Please consult [email or web address] for contact info under        Signed, Abagail Kitchens, PA-C  02/20/2023, 7:56 AM     Patient seen and examined with Yvonna Alanis PA-C.  Agree as above, with the following exceptions and changes as noted below. Feels well overall but notes significant life stressors. Gen: NAD, CV: RRR, no murmurs, Lungs: clear, Abd: soft, Extrem: Warm, well perfused, no edema, Neuro/Psych: alert and oriented x 3, normal mood and affect. All available labs, radiology testing, previous records reviewed. Cath site stable, discussed med titration for anti anginal effect. Reviewed cath in detail with Dr. [place], medical management given risk benefit analysis.  -uptitrate imdur to 90 mg daily. - follow up in office - schedule [facility] lipid clinic for consideration of inclisiran or [id number] given severity of CAD.   Parke Poisson, MD 02/20/23 11:44 AM

## 2023-02-20 NOTE — Discharge Summary (Signed)
Discharge Summary    Patient ID: Wayne Vang MRN: [id number]; DOB: 1961/05/07  Admit date: 02/19/2023 Discharge date: 02/20/2023  PCP:  Hannah Beat, MD   [facility] Providers Cardiologist:  Little Ishikawa, MD  Cardiology APP:  Marcelino Duster, [place]  {   Discharge Diagnoses    Principal Problem:   Unstable angina [facility]) Active Problems:   Hyperlipidemia   S/P CABG x 3   Ventricular fibrillation (HCC)   HFrEF (heart failure with reduced ejection fraction) (HCC)   Seizure (HCC)   Heart failure with improved ejection fraction (HFimpEF) [facility])    Diagnostic Studies/Procedures    Left heart catherization 02/20/2023  1st Mrg lesion is 25% stenosed.   Ost Cx to Prox Cx lesion is 55% stenosed.   Prox Graft to Insertion lesion between Ramus and 1st Mrg  is 100% stenosed.   Ramus-2 lesion is 85% stenosed.   Ramus-1 lesion is 80% stenosed.   Prox LAD lesion is 99% stenosed.   LV end diastolic pressure is mildly elevated.   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  Patent LIMA to the LAD Patent SVG to the ramus intermediate. There is a lesion 85% just distal to the anastomosis in the native vessel. There is also tenting of the native vessel at the anastomosis. There is significant size mismatch between the SVG and native vessel Occluded continuation of SVG limb to OM1. There is excellent flow from the native vessel with only 55% ostial LCx disease.  Mildly elevated LVEDP 22 mm Hg   Plan: recommend medical therapy. No obstructive disease in the RCA or LCx at this time. The LIMA to LAD is excellent. There is a stenosis in the ramus intermediate just after the anastomosis of the graft but this is not suitable for stenting due to significant size mismatch between the graft and native vessel. POBA could be done but may actually worsen the situation. The native ramus is diffusely diseased proximally.  Cardiac NM PET 01/27/2023    Findings are consistent with a medium size defect with mild-moderate ischemia in the lateral wall with normal global myocardial blood flow reserve. MBF in the LCx distribution is normal, but lower relative to the other coronary territories. The study is overall low risk given augmentation of EF, no TID, grossly normal MBFR.   LV perfusion is abnormal. There is evidence of ischemia. There is no evidence of infarction. Defect 1: There is a medium defect with mild-moderate reduction in uptake present in the apical to basal lateral location(s) that is reversible. There is normal wall motion in the defect area. Consistent with ischemia.   Rest left ventricular function is normal. Rest EF: 68 %. Stress left ventricular function is normal. Stress EF: 71 %. End diastolic cavity size is normal. End systolic cavity size is normal. No evidence of transient ischemic dilation (TID) noted.   Myocardial blood flow was computed to be 0.80ml/g/min at rest and 2.63ml/g/min at stress. Global myocardial blood flow reserve was 2.86 and was normal.   Coronary calcium assessment not performed due to prior revascularization.   CLINICAL DATA:  This over-read does not include interpretation of cardiac or coronary anatomy or pathology. The Cardiac PET CT interpretation by the cardiologist is attached.   COMPARISON:  None Available.   FINDINGS: Vascular: Normal heart size. No pericardial effusion. Normal caliber thoracic aorta with mild calcified plaque. Severe coronary artery calcifications status post CABG.   Mediastinum/Nodes: Esophagus is unremarkable. No  pathologically enlarged lymph nodes seen in the chest.   Lungs/Pleura: Central airways are patent. No consolidation, pleural effusion or pneumothorax.   Upper Abdomen: No acute abnormality.   Musculoskeletal: Prior median sternotomy. No acute osseous abnormality.   IMPRESSION: 1. No acute extracardiac abnormality. 2. Mild aortic Atherosclerosis  (ICD10-I70.0).     _____________   History of Present Illness     Wayne Vang is a 62 y.o. 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  He was admitted on 02/19/2023 for ongoing complaints of chest pain progressively gotten worse since his last admission.   Mr. Wayne Vang is a 62 year old male with the above history who is followed by Dr. Bjorn Pippin.  He was admitted in 02/2022 with chest pain.  High-sensitivity troponin mildly elevated at that time and peaked at 52.  Echo showed normal LV function and no regional wall motion abnormalities. He underwent cardiac catheterization which showed left main equivalent disease with proximal LAD and ostial LCx disease. CT surgery was consulted patient underwent CABG x3 with LIMA to LAD and sequential RSVG to ramus intermediate and OM1 on 03/26/2022. He had expected postop volume overload and acute blood loss anemia but postop course was relatively unremarkable.  He was discharged on 03/31/2022 but presented back to the ED on 04/17/2022 for further evaluation of sudden onset of severe chest pain.  STEMI was called in the field.  While in the ED, he had a VF arrest but ROSC was obtained after 1 shock.  He was taken immediately to the Cath Lab for emergent cardiac catheterization which showed patent grafts with 99% stenosis of the mid OM 2 at the insertion site of the SVG.  He underwent successful PTCA of this lesion.  Echo at that time showed LVEF of 45-50% with mild hypokinesis of the anterior septal/anterior wall from the base to the apex and a small pericardial effusion. Antiplatelet therapy is complicated  due to his phenytoin use for seizure disorder. After discussion with pharmacy, he was treated with Aspirin and Effient. He was readmitted in 04/2022 for unstable angina. Repeat cardiac catheterization showed patent grafts with normal flow to all territories. Continued medical therapy was recommended and he was started on Imdur. Patient reported recurrent chest tightness at visit with Dr. Bjorn Pippin in 09/2022. Repeat Echo and cardiac PET were ordered for further evaluation. Echo showed normalization of EF to 60-65% but I am unable to see the full report. Cardiac PET was completed on 01/28/2023 and showed a reversible defect in the apical to basal lateral locations consistent with ischemia. However, there were no high-risk findings. Imdur was increased with plans for repeat cath if he has persistent chest pain.   Patient presented to the ED today for further evaluation of chest pain. EKG showed sinus bradycardia, rate 55 bpm, with no acute ST/ T wave changes. High-sensitivity troponin negative x2. Chest x-ray showed no acute findings. WBC 5.3, Hgb 14.9, Plts 175. Na 141, K 3.7, Glucose 85, BUN 10, Cr 0.86. Cardiology consulted for further evaluation.   At the time of evaluation, patient was resting comfortably in no acute distress. He reported a sudden episode of sharp 6/10 chest pain  this morning that he states he could feel "moving" across his chest pain. This last a little over a minute and then resolved on its own. He states he almost felt like a "gas bubble" like when "swallow a coke and it goes down the wrong way." This was different than the prior chest tightness that he has been having with exertion. He has not noticed a huge difference in this after the increase in Imdur. He is wondering if this could be residual musculoskeletal pain from his sternotomy. It is reproducible with palpation of his chest wall but there may be some musculoskeletal component. He also reports dyspnea on exertion and bendopnea since  his CABG but it sounds like this is stable. He denies any new or worsening shortness of breath. No orthopnea, PND, or edema. No palpitations. He reports some mild lightheadedness when working out in the heat and when standing too quickly. However, no syncope. He has had some nasal congestion recently but no other recent fevers. He is compliant with all of his medication and is tolerating them well. No abnormal bleeding on DAPT.  Hospital Course     Consultants:    Unstable angina CAD s/p CABG x3 (LIMA-LAD and sequential RSVG to ramus intermediate and OM1 on 03/26/2022) Patient now presenting with worsening complaints of chest pain with exertion.  Describes as heaviness and has accompanied slight shortness of breath.  EKG showing no acute ischemic changes and negative troponins x 2.  Patient underwent cardiac catheterization that demonstrated two-vessel obstructive CAD involving the proximal LAD and ramus intermediate.  No obstructive disease in the RCA or LCx.  He had patent LIMA to LAD.  There was some stenosis in the ramus intermedius just after the anastomosis of the graft however not thought to be suitable for stenting due to significant size mismatch between the graft and native vessel.  Intervention thought to be more likely to cause harm than good, so plan will be to continue with medical management and up titration of antianginals. Continue DAPT with aspirin and Effient.  Reports 100% compliancy with this.  No Brilinta due to phenytoin. Increasing Imdur from 60 to 90 mg daily, Toprol XL 25 mg, rosuvastatin 40 mg, Zetia 10 mg Patient has previous prescriptions of Imdur 60 mg / 30 mg.  He will take both equaling 90 mg   History of HFmrEF Echocardiogram in September 2023 after STEMI showed moderately reduced EF of 45 to 50%.  However this has since normalized based off echocardiogram in April 2024 that shows EF of 60-65%.  Not able to view full results of this.  Outpatient reader is supposed to  review echo and provide formal report since this was not read initially.  Unclear of exact timeframe, may need to follow-up on this if still not read. No issues with volume or SOB. Euvolemic.    V-fib arrest In the setting of STEMI early thrombosis of insertion site as above.  No recurrences   Hyperlipidemia LDL summer 06/2022.  Continue Crestor 40 mg daily and Zetia 10 mg.  Will arrange follow-up with lipid clinic to further assist in lipid control.   Seizure disorder Continue home phenytoin with cardiology 200 mg and 300 mg in the evening. Has plenty of the 100mg  tablets at home.  Patient seen and examined by myself and Dr. Jacques Navy and deemed stable for discharge.  Left radial catheterization site has been reviewed and without any acute complications.  Follow-up has been arranged.  Patient will also receive a phone  call from lipid clinic for further cholesterol management.  Work note for light duty has also been provided and will be placed in chart.  Did the patient have an acute coronary syndrome (MI, NSTEMI, STEMI, etc) this admission?:  No                               Did the patient have a percutaneous coronary intervention (stent / angioplasty)?:  No.     _____________  Discharge Vitals Blood pressure 127/76, pulse (!) 56, temperature 98 F (36.7 C), temperature source Oral, resp. rate 18, height 6\' 2"  (1.88 m), weight 100.9 kg, SpO2 100%.  Filed Weights   02/19/23 1139 02/19/23 2029  Weight: 103 kg 100.9 kg   Physical Exam Cardiovascular:     Rate and Rhythm: Normal rate and regular rhythm.     Pulses: Normal pulses.     Heart sounds: Normal heart sounds.  Pulmonary:     Effort: Pulmonary effort is normal.     Breath sounds: Normal breath sounds.  Abdominal:     General: Abdomen is flat. Bowel sounds are normal.     Palpations: Abdomen is soft.  Skin:    General: Skin is warm and dry.     Capillary Refill: Capillary refill takes less than 2 seconds.   Left radial  site free of discharge, bleeding, bruising, tenderness.  Labs & Radiologic Studies    CBC Recent Labs    02/19/23 2344 02/20/23 1135  WBC 5.6 5.0  HGB 13.6 15.0  HCT 38.2* 42.6  MCV 88.0 86.2  PLT 145* 164   Basic Metabolic Panel Recent Labs    23/55/73 1142 02/19/23 2344 02/20/23 1135  NA 141  --   --   K 3.7  --   --   CL 103  --   --   CO2 25  --   --   GLUCOSE 85  --   --   BUN 10  --   --   CREATININE 0.86 0.95 0.99  CALCIUM 9.2  --   --    Liver Function Tests No results for input(s): "AST", "ALT", "ALKPHOS", "BILITOT", "PROT", "ALBUMIN" in the last 72 hours. No results for input(s): "LIPASE", "AMYLASE" in the last 72 hours. High Sensitivity Troponin:   Recent Labs  Lab 02/19/23 1142 02/19/23 1351  TROPONINIHS 4 4    BNP Invalid input(s): "POCBNP" D-Dimer No results for input(s): "DDIMER" in the last 72 hours. Hemoglobin A1C No results for input(s): "HGBA1C" in the last 72 hours. Fasting Lipid Panel No results for input(s): "CHOL", "HDL", "LDLCALC", "TRIG", "CHOLHDL", "LDLDIRECT" in the last 72 hours. Thyroid Function Tests No results for input(s): "TSH", "T4TOTAL", "T3FREE", "THYROIDAB" in the last 72 hours.  Invalid input(s): "FREET3" _____________  CARDIAC CATHETERIZATION  Result Date: 02/20/2023   1st Mrg lesion is 25% stenosed.   Ost Cx to Prox Cx lesion is 55% stenosed.   Prox Graft to Insertion lesion between Ramus and 1st Mrg  is 100% stenosed.   Ramus-2 lesion is 85% stenosed.   Ramus-1 lesion is 80% stenosed.   Prox LAD lesion is 99% stenosed.   LV end diastolic pressure is mildly elevated. 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 Patent LIMA to the LAD Patent SVG to the ramus intermediate. There is a lesion 85% just distal to the anastomosis in the native vessel. There is also tenting of the native  vessel at the anastomosis. There is significant size mismatch between the SVG and native vessel Occluded  continuation of SVG limb to OM1. There is excellent flow from the native vessel with only 55% ostial LCx disease. Mildly elevated LVEDP 22 mm Hg Plan: recommend medical therapy. No obstructive disease in the RCA or LCx at this time. The LIMA to LAD is excellent. There is a stenosis in the ramus intermediate just after the anastomosis of the graft but this is not suitable for stenting due to significant size mismatch between the graft and native vessel. POBA could be done but may actually worsen the situation. The native ramus is diffusely diseased proximally.  DG Chest 2 View  Result Date: 02/19/2023 CLINICAL DATA:  chest pain EXAM: CHEST - 2 VIEW COMPARISON:  CXR 04/30/22 FINDINGS: Status post median sternotomy CABG. No pleural effusion. No pneumothorax. No focal airspace opacity. No radiographically apparent displaced rib fracture. Visualized upper abdomen unremarkable. Vertebral body heights are maintained. IMPRESSION: No focal airspace opacity Electronically Signed   By: Lorenza Cambridge M.D.   On: 02/19/2023 12:14   NM PET CT CARDIAC PERFUSION MULTI W/ABSOLUTE BLOODFLOW  Result Date: 01/28/2023   Findings are consistent with a medium size defect with mild-moderate ischemia in the lateral wall with normal global myocardial blood flow reserve. MBF in the LCx distribution is normal, but lower relative to the other coronary territories. The study is overall low risk given augmentation of EF, no TID, grossly normal MBFR.   LV perfusion is abnormal. There is evidence of ischemia. There is no evidence of infarction. Defect 1: There is a medium defect with mild-moderate reduction in uptake present in the apical to basal lateral location(s) that is reversible. There is normal wall motion in the defect area. Consistent with ischemia.   Rest left ventricular function is normal. Rest EF: 68 %. Stress left ventricular function is normal. Stress EF: 71 %. End diastolic cavity size is normal. End systolic cavity size is  normal. No evidence of transient ischemic dilation (TID) noted.   Myocardial blood flow was computed to be 0.58ml/g/min at rest and 2.74ml/g/min at stress. Global myocardial blood flow reserve was 2.86 and was normal.   Coronary calcium assessment not performed due to prior revascularization. CLINICAL DATA:  This over-read does not include interpretation of cardiac or coronary anatomy or pathology. The Cardiac PET CT interpretation by the cardiologist is attached. COMPARISON:  None Available. FINDINGS: Vascular: Normal heart size. No pericardial effusion. Normal caliber thoracic aorta with mild calcified plaque. Severe coronary artery calcifications status post CABG. Mediastinum/Nodes: Esophagus is unremarkable. No pathologically enlarged lymph nodes seen in the chest. Lungs/Pleura: Central airways are patent. No consolidation, pleural effusion or pneumothorax. Upper Abdomen: No acute abnormality. Musculoskeletal: Prior median sternotomy. No acute osseous abnormality. IMPRESSION: 1. No acute extracardiac abnormality. 2. Mild aortic Atherosclerosis (ICD10-I70.0). Electronically Signed   By: Allegra Lai M.D.   On: 01/27/2023 09:32  Disposition   Pt is being discharged home today in good condition.  Follow-up Plans & Appointments     Follow-up Information     Marcelino Duster, PA Follow up.   Specialties: Cardiology, Radiology Why: Tuesday Mar 03, 2023 Appt at 8:25 AM (25 min) Contact information: [address] [place] [address] [phone]                Discharge Instructions     AMB Referral to Advanced Lipid Disorders Clinic   Complete by: As directed    Internal  Lipid Clinic Referral Scheduling  Internal lipid clinic referrals are providers within [facility], who wish to refer established patients for routine management (help in starting PCSK9 inhibitor therapy) or advanced therapies.  Internal MD referral criteria:              1. All patients with LDL>190  mg/dL  2. All patients with Triglycerides >500 mg/dL  3. Patients with suspected or confirmed heterozygous familial hyperlipidemia (HeFH) or homozygous familial hyperlipidemia (HoFH)  4. Patients with family history of suspicious for genetic dyslipidemia desiring genetic testing  5. Patients refractory to standard guideline based therapy  6. Patients with statin intolerance (failed 2 statins, one of which must be a high potency statin)  7. Patients who the provider desires to be seen by MD   Internal PharmD referral criteria:   1. Follow-up patients for medication management  2. Follow-up for compliance monitoring  3. Patients for drug education  4. Patients with statin intolerance  5. PCSK9 inhibitor education and prior authorization approvals  6. Patients with triglycerides <500 mg/dL  External Lipid Clinic Referral  External lipid clinic referrals are for providers outside of [facility], considered new clinic patients - automatically routed to MD schedule   Diet - low sodium heart healthy   Complete by: As directed    Discharge instructions   Complete by: As directed    Radial Site Care Refer to this sheet in the next few weeks. These instructions provide you with information on caring for yourself after your procedure. Your caregiver may also give you more specific instructions. Your treatment has been planned according to current medical practices, but problems sometimes occur. Call your caregiver if you have any problems or questions after your procedure. HOME CARE INSTRUCTIONS You may shower the day after the procedure. Remove the bandage (dressing) and gently wash the site with plain soap and water. Gently pat the site dry.  Do not apply powder or lotion to the site.  Do not submerge the affected site in water for 3 to 5 days.  Inspect the site at least twice daily.  Do not flex or bend the affected arm for 24 hours.  No lifting over 5 pounds (2.3 kg) for 5 days after  your procedure.  Do not drive home if you are discharged the same day of the procedure. Have someone else drive you.  You may drive 24 hours after the procedure unless otherwise instructed by your caregiver.  What to expect: Any bruising will usually fade within 1 to 2 weeks.  Blood that collects in the tissue (hematoma) may be painful to the touch. It should usually decrease in size and tenderness within 1 to 2 weeks.  SEEK IMMEDIATE MEDICAL CARE IF: You have unusual pain at the radial site.  You have redness, warmth, swelling, or pain at the radial site.  You have drainage (other than a small amount of blood on the dressing).  You have chills.  You have a fever or persistent symptoms for more than 72 hours.  You have a fever and your symptoms suddenly get worse.  Your arm becomes pale, cool, tingly, or numb.  You have heavy bleeding from the site. Hold pressure on the site.   Increase activity slowly   Complete by: As directed         Discharge Medications   Allergies as of 02/20/2023       Reactions   Nexium [esomeprazole] Other (See Comments)   Abdominal pain   Zocor [  simvastatin] Other (See Comments)   Ineffective    Gaviscon [alum Hydroxide-mag Carbonate] Other (See Comments)   Medication interaction only        Medication List     STOP taking these medications    lansoprazole 30 MG capsule Commonly known as: PREVACID Replaced by: pantoprazole 40 MG tablet       TAKE these medications    acetaminophen 500 MG tablet Commonly known as: TYLENOL Take 1,000 mg by mouth daily as needed for mild pain or headache.   aspirin EC 81 MG tablet Take 1 tablet (81 mg total) by mouth daily. Swallow whole.   ezetimibe 10 MG tablet Commonly known as: ZETIA Take 1 tablet (10 mg total) by mouth daily.   isosorbide mononitrate 60 MG 24 hr tablet Commonly known as: IMDUR Take 1.5 tablets (90 mg total) by mouth daily. What changed: how much to take   metoprolol  succinate 25 MG 24 hr tablet Commonly known as: TOPROL-XL Take 1 tablet (25 mg total) by mouth daily.   nitroGLYCERIN 0.4 MG SL tablet Commonly known as: Nitrostat Place 1 tablet (0.4 mg total) under the tongue every 5 (five) minutes as needed for chest pain.   pantoprazole 40 MG tablet Commonly known as: PROTONIX Take 1 tablet (40 mg total) by mouth daily. Start taking on: February 21, 2023 Replaces: lansoprazole 30 MG capsule   phenytoin 100 MG ER capsule Commonly known as: DILANTIN Take 2 capsules (200 mg total) by mouth every morning AND 3 capsules (300 mg total) at bedtime. Take 2 capsules (200 mg) every morning and then take 3 capsules (300 mg) at bedtime.   prasugrel 10 MG Tabs tablet Commonly known as: EFFIENT Take 1 tablet (10 mg total) by mouth daily.   rosuvastatin 40 MG tablet Commonly known as: CRESTOR Take 1 tablet (40 mg total) by mouth daily.   VITAMIN D-3 PO Take 5,000 Units by mouth daily.           Outstanding Labs/Studies    Duration of Discharge Encounter   Greater than 30 minutes including physician time.  Signed, Abagail Kitchens, PA-C 02/20/2023, 2:19 PM

## 2023-02-22 NOTE — Progress Notes (Signed)
Cardiology Office Note:    Date:  03/03/2023   ID:  [name], DOB 02-04-1961, MRN [id number]  PCP:  Hannah Beat, MD   [facility] Providers Cardiologist:  Little Ishikawa, MD Cardiology APP:  Marcelino Duster, PA     Referring MD: Hannah Beat, MD   Chief Complaint  Patient presents with   Follow-up    CAD    History of Present Illness:    Wayne Vang is a 62 y.o. male with a hx of CAD s/p CABG, HLD, seizure disorder, GERD, and hiatal hernia. He was hospitalized 03/19/22 with chest pain and elevated troponin. Heart catheterization revealed left main equivalent disease with proximal LAD and ostial LCX disease. He was recommended for CABG revascularization. Echocardiogram with preserved EF 60-65%, mild LVH, grade 1 DD, and no significant valvular disease. He underwent CABG x 3 with LIMA-LAD and SVG-RI-IM1 on 03/26/22. Post op course was unremarkable. Unfortunately, he presented back to [facility] 04/17/22 for sudden onset severe CP. CODE STEMI activated in the field. While in the ER, he suffered a Vfib arrest with ROSC after 1 shock. He was taken to the cath lab emergently for heart cath. HS troponin trended to 2080. LHC showed patent grafts but 99% occlusion of the OM2 at the graft insertion site treated with PTCA. Antiplatelet therapy felt complicated by his antiseizure medication phenytoin.  It was felt brilinta's effectiveness would be diminished by phenytoin. Plavix was switched to effient.  I saw him in follow up 04/28/22 and he was doing well. Unfortunately he developed mid sternal CP prompting ER evaluation. Repeat heart cath did not reveal any new lesions. He was discharged on continued medical therapy.   He has a history of chest pain prompted by stress. Last seen by Dr. Bjorn Pippin 12/2022 and reported chest pain. A PET stress test 01/27/2023 for continued chest pain that did not show any evidence of ischemia.  Unfortunately he was hospitalized 02/19/2023 with  chest pain concerning for angina. LHC repeated and showed stenosis of hte ramus intermedius just after the anastomosis of the graft however not thought to be suitable for stenting due to significant size mismatch between the graft and native vessel.  Intervention felt to be high risk for causing more issues.  He was discharged with continue medical therapy.  He presents for follow up. Overall, he is doing OK. He does report sitting up at night once needing to take a deep breath. His wife reports he snores loudly and has witnessed apneic episodes. No chest pain but sometimes he feels he needs to "stretch" his chest.     Past Medical History:  Diagnosis Date   Acute MI, 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   Esophageal reflux    HFrEF (heart failure with reduced ejection fraction) (HCC) 04/19/2022   Hiatal hernia    Hyperlipidemia    Low back pain    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   Seizure disorder (HCC)    Skin cancer    "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"   Tremor     Past Surgical History:  Procedure Laterality Date   COLONOSCOPY     CORONARY ARTERY BYPASS GRAFT N/A 03/26/2022   Procedure: CORONARY ARTERY BYPASS GRAFTING (CABG) X THREE, USING LEFT INTERNAL MAMMARY ARTERY AND RIGHT LEG GREATER SAPHENOUS VEIN;  Surgeon: Eugenio Hoes, MD;  Location: [facility] OR;  Service: Open Heart Surgery;  Laterality: N/A;   CORONARY BALLOON ANGIOPLASTY N/A 04/17/2022   Procedure: CORONARY BALLOON ANGIOPLASTY;  Surgeon: Eldridge Dace,  Donnie Coffin, MD;  Location: [facility] INVASIVE CV LAB;  Service: Cardiovascular;  Laterality: N/A;   INGUINAL HERNIA REPAIR Right 12/06/2013   Procedure:  REPAIR RIGHT INGUINAL HERNIA;  Surgeon: Clovis Pu. Cornett, MD;  Location: [facility];  Service: General;  Laterality: Right;   INGUINAL HERNIA REPAIR Left 07/21/2013   INSERTION OF MESH N/A 12/06/2013   Procedure: INSERTION OF MESH;  Surgeon: Clovis Pu. Cornett, MD;  Location: [facility];  Service: General;  Laterality: N/A;   LEFT  HEART CATH AND CORONARY ANGIOGRAPHY N/A 03/20/2022   Procedure: LEFT HEART CATH AND CORONARY ANGIOGRAPHY;  Surgeon: Corky Crafts, MD;  Location: [facility] INVASIVE CV LAB;  Service: Cardiovascular;  Laterality: N/A;   LEFT HEART CATH AND CORONARY ANGIOGRAPHY N/A 04/17/2022   Procedure: LEFT HEART CATH AND CORONARY ANGIOGRAPHY;  Surgeon: Corky Crafts, MD;  Location: [facility] INVASIVE CV LAB;  Service: Cardiovascular;  Laterality: N/A;   LEFT HEART CATH AND CORS/GRAFTS ANGIOGRAPHY N/A 04/17/2022   Procedure: LEFT HEART CATH AND CORS/GRAFTS ANGIOGRAPHY;  Surgeon: Corky Crafts, MD;  Location: [facility] INVASIVE CV LAB;  Service: Cardiovascular;  Laterality: N/A;   LEFT HEART CATH AND CORS/GRAFTS ANGIOGRAPHY N/A 05/01/2022   Procedure: LEFT HEART CATH AND CORS/GRAFTS ANGIOGRAPHY;  Surgeon: Corky Crafts, MD;  Location: [facility] INVASIVE CV LAB;  Service: Cardiovascular;  Laterality: N/A;   LEFT HEART CATH AND CORS/GRAFTS ANGIOGRAPHY N/A 02/20/2023   Procedure: LEFT HEART CATH AND CORS/GRAFTS ANGIOGRAPHY;  Surgeon: Swazil[place], Peter M, MD;  Location: [facility] INVASIVE CV LAB;  Service: Cardiovascular;  Laterality: N/A;   POLYPECTOMY     TEE WITHOUT CARDIOVERSION N/A 03/26/2022   Procedure: TRANSESOPHAGEAL ECHOCARDIOGRAM (TEE);  Surgeon: Eugenio Hoes, MD;  Location: [facility] OR;  Service: Open Heart Surgery;  Laterality: N/A;    Current Medications: Current Meds  Medication Sig   acetaminophen (TYLENOL) 500 MG tablet Take 1,000 mg by mouth daily as needed for mild pain or headache.   aspirin EC 81 MG tablet Take 1 tablet (81 mg total) by mouth daily. Swallow whole.   Cholecalciferol (VITAMIN D-3 PO) Take 5,000 Units by mouth daily.   ezetimibe (ZETIA) 10 MG tablet Take 1 tablet (10 mg total) by mouth daily.   isosorbide mononitrate (IMDUR) 60 MG 24 hr tablet Take 1.5 tablets (90 mg total) by mouth daily.   metoprolol succinate (TOPROL-XL) 25 MG 24 hr tablet Take 1 tablet (25 mg total) by mouth daily.    nitroGLYCERIN (NITROSTAT) 0.4 MG SL tablet Place 1 tablet (0.4 mg total) under the tongue every 5 (five) minutes as needed for chest pain.   pantoprazole (PROTONIX) 40 MG tablet Take 1 tablet (40 mg total) by mouth daily.   phenytoin (DILANTIN) 100 MG ER capsule Take 2 capsules (200 mg total) by mouth every morning AND 3 capsules (300 mg total) at bedtime. Take 2 capsules (200 mg) every morning and then take 3 capsules (300 mg) at bedtime.   prasugrel (EFFIENT) 10 MG TABS tablet Take 1 tablet (10 mg total) by mouth daily.   rosuvastatin (CRESTOR) 40 MG tablet Take 1 tablet (40 mg total) by mouth daily.     Allergies:   Nexium [esomeprazole], Zocor [simvastatin], and Gaviscon [alum hydroxide-mag carbonate]   Social History   Socioeconomic History   Marital status: Married    Spouse name: Not on file   Number of children: 1   Years of education: Not on file   Highest education level: Not on file  Occupational  History   Occupation: Shop Nurse, mental health: [employer]    Comment: [employer]  Tobacco Use   Smoking status: Former    Current packs/day: 0.00    Types: Cigarettes    Quit date: 07/21/2002    Years since quitting: 20.6    Passive exposure: Never   Smokeless tobacco: Never  Vaping Use   Vaping status: Never Used  Substance and Sexual Activity   Alcohol use: Yes    Comment: socially   Drug use: No   Sexual activity: Not on file  Other Topics Concern   Not on file  Social History Narrative      He has been married 31+ years.   Caffeine Use: He drinks coke, tea, and coffee.   Truck Curator   Social Determinants of Health   Financial Resource Strain: Not on file  Food Insecurity: No Food Insecurity (02/19/2023)   Hunger Vital Sign    Worried About Running Out of Food in the Last Year: Never true    Ran Out of Food in the Last Year: Never true  Transportation Needs: No Transportation Needs (02/19/2023)   PRAPARE - Scientist, research (physical sciences) (Medical): No    Lack of Transportation (Non-Medical): No  Physical Activity: Not on file  Stress: Not on file  Social Connections: Not on file     Family History: The patient's family history includes Alcohol abuse in his brother; Alzheimer's disease in his mother; Cancer in his father; Colon polyps in his sister; Stroke in his sister. There is no history of Colon cancer, Esophageal cancer, Stomach cancer, or Rectal cancer.  ROS:   Please see the history of present illness.     All other systems reviewed and are negative.  EKGs/Labs/Other Studies Reviewed:    The following studies were reviewed today:  Left heart cath 02/20/23:   1st Mrg lesion is 25% stenosed.   Ost Cx to Prox Cx lesion is 55% stenosed.   Prox Graft to Insertion lesion between Ramus and 1st Mrg  is 100% stenosed.   Ramus-2 lesion is 85% stenosed.   Ramus-1 lesion is 80% stenosed.   Prox LAD lesion is 99% stenosed.   LV end diastolic pressure is mildly elevated.   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  Patent LIMA to the LAD Patent SVG to the ramus intermediate. There is a lesion 85% just distal to the anastomosis in the native vessel. There is also tenting of the native vessel at the anastomosis. There is significant size mismatch between the SVG and native vessel Occluded continuation of SVG limb to OM1. There is excellent flow from the native vessel with only 55% ostial LCx disease.  Mildly elevated LVEDP 22 mm Hg   Plan: recommend medical therapy. No obstructive disease in the RCA or LCx at this time. The LIMA to LAD is excellent. There is a stenosis in the ramus intermediate just after the anastomosis of the graft but this is not suitable for stenting due to significant size mismatch between the graft and native vessel. POBA could be done but may actually worsen the situation. The native ramus is diffusely diseased proximally.   PET stress test 01/27/23:    Findings are consistent with a medium size defect with mild-moderate ischemia in the lateral wall with normal global myocardial blood flow reserve. MBF in the LCx distribution is normal, but lower relative to the other coronary territories. The  study is overall low risk given augmentation of EF, no TID, grossly normal MBFR.   LV perfusion is abnormal. There is evidence of ischemia. There is no evidence of infarction. Defect 1: There is a medium defect with mild-moderate reduction in uptake present in the apical to basal lateral location(s) that is reversible. There is normal wall motion in the defect area. Consistent with ischemia.   Rest left ventricular function is normal. Rest EF: 68 %. Stress left ventricular function is normal. Stress EF: 71 %. End diastolic cavity size is normal. End systolic cavity size is normal. No evidence of transient ischemic dilation (TID) noted.   Myocardial blood flow was computed to be 0.50ml/g/min at rest and 2.65ml/g/min at stress. Global myocardial blood flow reserve was 2.86 and was normal.   Coronary calcium assessment not performed due to prior revascularization.         Recent Labs: 03/19/2022: TSH 1.321 03/27/2022: Magnesium 2.2 07/22/2022: ALT 18 02/19/2023: BUN 10; Potassium 3.7; Sodium 141 02/20/2023: Creatinine, Ser 0.99; Hemoglobin 15.0; Platelets 164  Recent Lipid Panel    Component Value Date/Time   CHOL 168 10/14/2022 0844   TRIG 70 10/14/2022 0844   HDL 86 10/14/2022 0844   CHOLHDL 2.0 10/14/2022 0844   CHOLHDL 2.1 04/17/2022 1238   VLDL 25 04/17/2022 1238   LDLCALC 69 10/14/2022 0844   LDLDIRECT 194.1 04/27/2009 0901     Risk Assessment/Calculations:           STOP-Bang Score:  7       Physical Exam:    VS:  BP 102/62   Pulse 72   Ht 6\' 2"  (1.88 m)   Wt 224 lb (101.6 kg)   SpO2 98%   BMI 28.76 kg/m     Wt Readings from Last 3 Encounters:  03/03/23 224 lb (101.6 kg)  02/19/23 222 lb 6.4 oz (100.9 kg)  02/06/23 227 lb 4.8 oz  (103.1 kg)     GEN:  Well nourished, well developed in no acute distress HEENT: Normal NECK: No JVD; No carotid bruits LYMPHATICS: No lymphadenopathy CARDIAC: RRR, no murmurs, rubs, gallops RESPIRATORY:  Clear to auscultation without rales, wheezing or rhonchi  ABDOMEN: Soft, non-tender, non-distended MUSCULOSKELETAL:  No edema; No deformity  SKIN: Warm and dry NEUROLOGIC:  Alert and oriented x 3 PSYCHIATRIC:  Normal affect  Left radial cath site C/D/I  ASSESSMENT:    1. SOB (shortness of breath)   2. S/P CABG x 3   3. Cardiac arrest (HCC)   4. HFrEF (heart failure with reduced ejection fraction) (HCC)   5. Hyperlipidemia with target LDL less than 70   6. Seizure (HCC)   7. Daytime somnolence   8. Snoring   9. Sleep apnea, unspecified type    PLAN:    In order of problems listed above:  Shortness of breath - suspect he has sleep apnea that we will evaluate - if he continues to have symptoms or they become more frequent, repeat an echo   CAD s/p CABG x 3 LIMA-LAD and SVG-RI-IM1 on 03/26/22 ASA and effient, brilinta not used in the setting of phenytoin use Early anastomosis gaft thrombosis requiring POBA at SVG-OM 04/17/2022 Continue BB and statin, 90 mg Imdur   VF arrest VF arrest in [facility] 04/17/22 with ROSC after 1 shock, in the setting of STEMI. No signifcant ventricular arrhythmias following revascularization. Continue BB. No lifevest recommended.    HFmrEF Echo with LVEF 45-50% with mild hypokinesis of the anterior septal-anterior wall from  base to apex with small pericardial effusion. Suspect due to inferior STEMI and VF arrest.  Repeat echocardiogram April 2024 showed EF 60-65%. GDMT: Toprol, Imdur   Hyperlipidemia with LDL goal < 70 04/17/2022: Cholesterol 165; HDL 79; LDL Cholesterol 61; Triglycerides 123; VLDL 25 Continue crestor 40 mg and Zetia   Seizure disorder Continue home phenytoin dosing   Daytime somnolence Snoring Sleep apnea - will screen  for OSA with home sleep study   Follow up in 3-4 months.        Medication Adjustments/Labs and Tests Ordered: Current medicines are reviewed at length with the patient today.  Concerns regarding medicines are outlined above.  Orders Placed This Encounter  Procedures   [facility] Sleep Study   No orders of the defined types were placed in this encounter.   Patient Instructions  Medication Instructions:  Your physician recommends that you continue on your current medications as directed. Please refer to the Current Medication list given to you today.  *If you need a refill on your cardiac medications before your next appointment, please call your pharmacy*    Testing/Procedures: WatchPAT?  Is a FDA cleared portable home sleep study test that uses a watch and 3 points of contact to monitor 7 different channels, including your heart rate, oxygen saturations, body position, snoring, and chest motion.  The study is easy to use from the comfort of your own home and accurately detect sleep apnea.  Before bed, you attach the chest sensor, attached the sleep apnea bracelet to your nondominant hand, and attach the finger probe.  After the study, the raw data is downloaded from the watch and scored for apnea events.   For more information: [email or web address]  Patient Testing Instructions:  Do not put battery into the device until bedtime when you are ready to begin the test. Please call the support number if you need assistance after following the instructions below: 24 hour support line- [phone] or [facility] support at [phone] (option 2)  Download the IntelWatchPAT One" app through the google play store or App Store  Be sure to turn on or enable access to bluetooth in settlings on your smartphone/ device  Make sure no other bluetooth devices are on and within the vicinity of your smartphone/ device and WatchPAT watch during testing.  Make sure to leave your smart  phone/ device plugged in and charging all night.  When ready for bed:  Follow the instructions step by step in the WatchPAT One App to activate the testing device. For additional instructions, including video instruction, visit the WatchPAT One video on Youtube. You can search for WatchPat One within Youtube (video is 4 minutes and 18 seconds) or enter: https://youtube/watch?[email or web address]_vbiwxE Please note: You will be prompted to enter a Pin to connect via bluetooth when starting the test. The PIN will be assigned to you when you receive the test.  The device is disposable, but it recommended that you retain the device until you receive a call letting you know the study has been received and the results have been interpreted.  We will let you know if the study did not transmit to [place] properly after the test is completed. You do not need to call us to confirm the receipt of the test.  Please complete the test within 48 hours of receiving PIN.   Frequently Asked Questions:  What is Watch Dennie Bible one?  A single use fully disposable home sleep apnea testing device and will not need to be returned after  completion.  What are the requirements to use WatchPAT one?  The be able to have a successful watchpat one sleep study, you should have your Watch pat one device, your smart phone, watch pat one app, your PIN number and Internet access What type of phone do I need?  You should have a smart phone that uses Android 5.1 and above or any Iphone with IOS 10 and above How can I download the WatchPAT one app?  Based on your device type search for WatchPAT one app either in google play for android devices or APP store for Iphone's Where will I get my PIN for the study?  Your PIN will be provided by your physician's office. It is used for authentication and if you lose/forget your PIN, please reach out to your providers office.  I do not have Internet at home. Can I do WatchPAT one study?  WatchPAT One needs Internet  connection throughout the night to be able to transmit the sleep data. You can use your home/local internet or your cellular's data package. However, it is always recommended to use home/local Internet. It is estimated that between 20MB-30MB will be used with each study.However, the application will be looking for space in the phone to start the study.  What happens if I lose internet or bluetooth connection?  During the internet disconnection, your phone will not be able to transmit the sleep data. All the data, will be stored in your phone. As soon as the internet connection is back on, the phone will being sending the sleep data. During the bluetooth disconnection, WatchPAT one will not be able to to send the sleep data to your phone. Data will be kept in the [facility] one until two devices have bluetooth connection back on. As soon as the connection is back on, WatchPAT one will send the sleep data to the phone.  How long do I need to wear the WatchPAT one?  After you start the study, you should wear the device at least 6 hours.  How far should I keep my phone from the device?  During the night, your phone should be within 15 feet.  What happens if I leave the room for restroom or other reasons?  Leaving the room for any reason will not cause any problem. As soon as your get back to the room, both devices will reconnect and will continue to send the sleep data. Can I use my phone during the sleep study?  Yes, you can use your phone as usual during the study. But it is recommended to put your watchpat one on when you are ready to go to bed.  How will I get my study results?  A soon as you completed your study, your sleep data will be sent to the provider. They will then share the results with you when they are ready.       Follow-Up: At [facility], you and your health needs are our priority.  As part of our continuing mission to provide you with exceptional heart care, we have  created designated Provider Care Teams.  These Care Teams include your primary Cardiologist (physician) and Advanced Practice Providers (APPs -  Physician Assistants and Nurse Practitioners) who all work together to provide you with the care you need, when you need it.  We recommend signing up for the patient portal called "MyChart".  Sign up information is provided on this After Visit Summary.  MyChart is used to connect with  patients for Virtual Visits (Telemedicine).  Patients are able to view lab/test results, encounter notes, upcoming appointments, etc.  Non-urgent messages can be sent to your provider as well.   To learn more about what you can do with MyChart, go to [email or web address].    Your next appointment:   3-4 month(s)  Provider:   Little Ishikawa, MD      Signed, Roe [place], [place]  03/03/2023 9:13 AM    [facility]

## 2023-02-23 ENCOUNTER — Encounter (HOSPITAL_COMMUNITY): Payer: Self-pay | Admitting: Cardiology

## 2023-03-03 ENCOUNTER — Ambulatory Visit: Payer: BC Managed Care – PPO | Attending: Physician Assistant | Admitting: Physician Assistant

## 2023-03-03 ENCOUNTER — Encounter: Payer: Self-pay | Admitting: Physician Assistant

## 2023-03-03 VITALS — BP 102/62 | HR 72 | Ht 74.0 in | Wt 224.0 lb

## 2023-03-03 DIAGNOSIS — R400 Somnolence: Secondary | ICD-10-CM

## 2023-03-03 DIAGNOSIS — R0683 Snoring: Secondary | ICD-10-CM

## 2023-03-03 DIAGNOSIS — I469 Cardiac arrest, cause unspecified: Secondary | ICD-10-CM

## 2023-03-03 DIAGNOSIS — R569 Unspecified convulsions: Secondary | ICD-10-CM

## 2023-03-03 DIAGNOSIS — Z951 Presence of aortocoronary bypass graft: Secondary | ICD-10-CM | POA: Diagnosis not present

## 2023-03-03 DIAGNOSIS — G4730 Sleep apnea, unspecified: Secondary | ICD-10-CM

## 2023-03-03 DIAGNOSIS — I5020 Unspecified systolic (congestive) heart failure: Secondary | ICD-10-CM

## 2023-03-03 DIAGNOSIS — E785 Hyperlipidemia, unspecified: Secondary | ICD-10-CM

## 2023-03-03 DIAGNOSIS — R0602 Shortness of breath: Secondary | ICD-10-CM | POA: Diagnosis not present

## 2023-03-03 NOTE — Patient Instructions (Addendum)
Medication Instructions:  Your physician recommends that you continue on your current medications as directed. Please refer to the Current Medication list given to you today.  *If you need a refill on your cardiac medications before your next appointment, please call your pharmacy*    Testing/Procedures: WatchPAT?  Is a FDA cleared portable home sleep study test that uses a watch and 3 points of contact to monitor 7 different channels, including your heart rate, oxygen saturations, body position, snoring, and chest motion.  The study is easy to use from the comfort of your own home and accurately detect sleep apnea.  Before bed, you attach the chest sensor, attached the sleep apnea bracelet to your nondominant hand, and attach the finger probe.  After the study, the raw data is downloaded from the watch and scored for apnea events.   For more information: [email or web address]  Patient Testing Instructions:  Do not put battery into the device until bedtime when you are ready to begin the test. Please call the support number if you need assistance after following the instructions below: 24 hour support line- [phone] or [facility] support at [phone] (option 2)  Download the IntelWatchPAT One" app through the google play store or App Store  Be sure to turn on or enable access to bluetooth in settlings on your smartphone/ device  Make sure no other bluetooth devices are on and within the vicinity of your smartphone/ device and WatchPAT watch during testing.  Make sure to leave your smart phone/ device plugged in and charging all night.  When ready for bed:  Follow the instructions step by step in the WatchPAT One App to activate the testing device. For additional instructions, including video instruction, visit the WatchPAT One video on Youtube. You can search for WatchPat One within Youtube (video is 4 minutes and 18 seconds) or enter:  https://youtube/watch?v=BCce_vbiwxE Please note: You will be prompted to enter a Pin to connect via bluetooth when starting the test. The PIN will be assigned to you when you receive the test.  The device is disposable, but it recommended that you retain the device until you receive a call letting you know the study has been received and the results have been interpreted.  We will let you know if the study did not transmit to [place] properly after the test is completed. You do not need to call us to confirm the receipt of the test.  Please complete the test within 48 hours of receiving PIN.   Frequently Asked Questions:  What is Watch Dennie Bible one?  A single use fully disposable home sleep apnea testing device and will not need to be returned after completion.  What are the requirements to use WatchPAT one?  The be able to have a successful watchpat one sleep study, you should have your Watch pat one device, your smart phone, watch pat one app, your PIN number and Internet access What type of phone do I need?  You should have a smart phone that uses Android 5.1 and above or any Iphone with IOS 10 and above How can I download the WatchPAT one app?  Based on your device type search for WatchPAT one app either in google play for android devices or APP store for Iphone's Where will I get my PIN for the study?  Your PIN will be provided by your physician's office. It is used for authentication and if you lose/forget your PIN, please reach out to your providers office.  I do not  have Internet at home. Can I do WatchPAT one study?  WatchPAT One needs Internet connection throughout the night to be able to transmit the sleep data. You can use your home/local internet or your cellular's data package. However, it is always recommended to use home/local Internet. It is estimated that between 20MB-30MB will be used with each study.However, the application will be looking for space in the phone to start the study.   What happens if I lose internet or bluetooth connection?  During the internet disconnection, your phone will not be able to transmit the sleep data. All the data, will be stored in your phone. As soon as the internet connection is back on, the phone will being sending the sleep data. During the bluetooth disconnection, WatchPAT one will not be able to to send the sleep data to your phone. Data will be kept in the [facility] one until two devices have bluetooth connection back on. As soon as the connection is back on, WatchPAT one will send the sleep data to the phone.  How long do I need to wear the WatchPAT one?  After you start the study, you should wear the device at least 6 hours.  How far should I keep my phone from the device?  During the night, your phone should be within 15 feet.  What happens if I leave the room for restroom or other reasons?  Leaving the room for any reason will not cause any problem. As soon as your get back to the room, both devices will reconnect and will continue to send the sleep data. Can I use my phone during the sleep study?  Yes, you can use your phone as usual during the study. But it is recommended to put your watchpat one on when you are ready to go to bed.  How will I get my study results?  A soon as you completed your study, your sleep data will be sent to the provider. They will then share the results with you when they are ready.       Follow-Up: At [facility], you and your health needs are our priority.  As part of our continuing mission to provide you with exceptional heart care, we have created designated Provider Care Teams.  These Care Teams include your primary Cardiologist (physician) and Advanced Practice Providers (APPs -  Physician Assistants and Nurse Practitioners) who all work together to provide you with the care you need, when you need it.  We recommend signing up for the patient portal called "MyChart".  Sign up information is  provided on this After Visit Summary.  MyChart is used to connect with patients for Virtual Visits (Telemedicine).  Patients are able to view lab/test results, encounter notes, upcoming appointments, etc.  Non-urgent messages can be sent to your provider as well.   To learn more about what you can do with MyChart, go to [email or web address].    Your next appointment:   3-4 month(s)  Provider:   Little Ishikawa, MD

## 2023-03-10 ENCOUNTER — Telehealth: Payer: Self-pay | Admitting: Cardiology

## 2023-03-10 NOTE — Telephone Encounter (Signed)
Patient wife states he is lightheaded in the morning for the past 2 days when he takes it he feels fine but then after the meds he gets dizzy and sick on stomach.  States the only med change was this one .  It was increased to 90 mg on 8/2.  He did not have this when it was at 60 mg. He has only taking this in the morning.   Should try in the evening or a change ?  Please advise

## 2023-03-10 NOTE — Telephone Encounter (Signed)
Left voicemail to return call to office.

## 2023-03-10 NOTE — Telephone Encounter (Signed)
Pt c/o medication issue:  1. Name of Medication: isosorbide mononitrate (IMDUR) 60 MG 24 hr tablet   2. How are you currently taking this medication (dosage and times per day)? Take 1.5 tablets (90 mg total) by mouth daily.   3. Are you having a reaction (difficulty breathing--STAT)? No  4. What is your medication issue? Patient's wife is calling because this medication is making the patient lightheaded in the morning. Patient's wife stated as the day goes on the lightheadedness will go away. Patient's wife is requesting we call the patient. Please advise.

## 2023-03-10 NOTE — Telephone Encounter (Signed)
Returned call to pt. LVM to return our call.

## 2023-03-11 NOTE — Telephone Encounter (Signed)
Recommend decreasing Imdur dose back to 60 mg daily

## 2023-03-11 NOTE — Telephone Encounter (Signed)
Spoke to patient's wife Dr.Schumann advised decrease Imdur back to 60 mg daily.Advised to call back if he continues to have dizziness and nausea.

## 2023-03-13 ENCOUNTER — Telehealth: Payer: Self-pay | Admitting: Cardiology

## 2023-03-13 ENCOUNTER — Ambulatory Visit: Payer: BC Managed Care – PPO | Attending: Cardiovascular Disease | Admitting: Student

## 2023-03-13 ENCOUNTER — Encounter: Payer: Self-pay | Admitting: Student

## 2023-03-13 VITALS — BP 116/72 | HR 70 | Ht 74.0 in | Wt 224.0 lb

## 2023-03-13 DIAGNOSIS — E785 Hyperlipidemia, unspecified: Secondary | ICD-10-CM | POA: Diagnosis not present

## 2023-03-13 MED ORDER — PANTOPRAZOLE SODIUM 40 MG PO TBEC: 40.0000 mg | DELAYED_RELEASE_TABLET | Freq: Every day | ORAL | 0 refills | Status: DC

## 2023-03-13 NOTE — Assessment & Plan Note (Signed)
Assessment/Plan:  LDL goal: 70mg /dl last LDLc 69 mg/dl (56/21/3086) Tolerates Zetia and high intensity statins well without any side effects  Will get updated lipid lab today  Discussed next option in line -PCSK9i if LDLc above goal in updated lab

## 2023-03-13 NOTE — Telephone Encounter (Signed)
Called, N/A LVM to call me back [phone]   Pantoprazole was accidentally taken out of the list I meat to order the refill per patient request.  I have sent prescription to [facility] for 90 days

## 2023-03-13 NOTE — Progress Notes (Signed)
Patient ID: Wayne Vang                 DOB: 11/23/1960                    MRN: [id number]      HPI: Wayne Vang is a 62 y.o. male patient referred to lipid clinic by Wayne Vang. PMH is significant for hx of CAD s/p CABG, HLD, seizure disorder, GERD, and hiatal hernia.   Patient can not tolerate Imdur 90 mg daily due to dizziness and nausea so does was reduced to 60 mg daily on 03/10/2023. Patient presented today with his wife. Reports he is tolerating lower dose Imdur well. His LDLc 10 months ago was 68 on statin and Zetia. He tolerates them well.  He is on heart healthy diet does not necessarily eats 3 meals per day. But he incorporate vegetables and fruits in his one big meal per day. He does not add salt to his food and does not eat lots of fried food will get updated lipid lab today. We reviewed options for lowering LDL cholesterol, including  PCSK-9 inhibitors. Discussed mechanisms of action, dosing, side effects and potential decreases in LDL cholesterol.  Also reviewed cost information and potential options for patient assistance. Patient is out of pantoprazole for GERD wants refill. Will send prescription for 90 days and advised to reach out to PCP for further refills    Current Medications: Crestor 40 mg and Zetia 10 mg  Intolerances: Zocor- was not effective to lower LDLc Risk Factors: CAD s/p CABG x 3, angina,  LDL goal: <70 mg/dl  Home BP :409/81 heart rate 68 Diet: heart healthy diet dinner is main meal  Drink: tea, Gatorade and gingle ale, coffee 2 cups, water 2X8oz   Exercise: none   Family History:  Relation Problem Comments  Mother (Deceased at age 84) Alzheimer's disease     Father (Deceased at age 84) Cancer     Sister - 2 (Alive) Colon polyps   Stroke age 11     Brother (Deceased) Alcohol abuse      Social History:  Alcohol: 3 beers per week  Smoking: quit 20 years ago  Labs: Lipid Panel     Component Value Date/Time   CHOL 172 03/13/2023 0855    TRIG 77 03/13/2023 0855   HDL 89 03/13/2023 0855   CHOLHDL 1.9 03/13/2023 0855   CHOLHDL 2.1 04/17/2022 1238   VLDL 25 04/17/2022 1238   LDLCALC 69 03/13/2023 0855   LDLDIRECT 194.1 04/27/2009 0901   LABVLDL 14 03/13/2023 0855    Past Medical History:  Diagnosis Date   Acute MI, 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     aspirin EC 81 MG tablet Take 1 tablet (81 mg total) by mouth daily. Swallow whole. 30 tablet    Cholecalciferol (VITAMIN D-3 PO) Take 5,000 Units by mouth daily.     ezetimibe (ZETIA) 10 MG  tablet Take 1 tablet (10 mg total) by mouth daily. 90 tablet 3   isosorbide mononitrate (IMDUR) 60 MG 24 hr tablet Take 1 tablet (60 mg total) by mouth daily.     metoprolol succinate (TOPROL-XL) 25 MG 24 hr tablet Take 1 tablet (25 mg total) by mouth daily. 90 tablet 3   nitroGLYCERIN (NITROSTAT) 0.4 MG SL tablet Place 1 tablet (0.4 mg total) under the tongue every 5 (five) minutes as needed for chest pain. 25 tablet 3   phenytoin (DILANTIN) 100 MG ER capsule Take 2 capsules (200 mg total) by mouth every morning AND 3 capsules (300 mg total) at bedtime. Take 2 capsules (200 mg) every morning and then take 3 capsules (300 mg) at bedtime. 450 capsule 3   prasugrel (EFFIENT) 10 MG TABS tablet Take 1 tablet (10 mg total) by mouth daily. 90 tablet 3   rosuvastatin (CRESTOR) 40 MG tablet Take 1 tablet (40 mg total) by mouth daily. 90 tablet 3   No current facility-administered medications on file prior to visit.    Allergies  Allergen Reactions   Nexium  [Esomeprazole] Other (See Comments)    Abdominal pain   Zocor [Simvastatin] Other (See Comments)    Ineffective     Gaviscon [Alum Hydroxide-Mag Carbonate] Other (See Comments)    Medication interaction only    Assessment/Plan:  1. Hyperlipidemia -  Problem  Hyperlipidemia   Qualifier: Diagnosis of  By: Jillyn Hidden FNP, Mcarthur Rossetti     Hyperlipidemia Assessment/Plan:  LDL goal: 70mg /dl last LDLc 69 mg/dl (32/44/0102) Tolerates Zetia and high intensity statins well without any side effects  Will get updated lipid lab today  Discussed next option in line -PCSK9i if LDLc above goal in updated lab      Thank you,  Carmela Hurt, Pharm.D [facility] [address], [place], [place] [address]  Phone: [phone]; Fax: [phone]

## 2023-03-13 NOTE — Telephone Encounter (Signed)
Pt's wife is requesting a callback regarding the appt this morning. She stated they were told the pantoprazole (PROTONIX) 40 MG tablet  would have a refill sent in to the pharmacy but she noticed it been completely removed from his medication list and she'd like to know why. Please advise

## 2023-03-13 NOTE — Patient Instructions (Signed)
Will get updated lipid lab today and if LDLc elevated we will initiate either Repatha or Praluent.

## 2023-03-14 LAB — LIPID PANEL
Chol/HDL Ratio: 1.9 ratio (ref 0.0–5.0)
Cholesterol, Total: 172 mg/dL (ref 100–199)
HDL: 89 mg/dL (ref >39)
LDL Chol Calc (NIH): 69 mg/dL (ref 0–99)
Triglycerides: 77 mg/dL (ref 0–149)
VLDL Cholesterol Cal: 14 mg/dL (ref 5–40)

## 2023-04-17 ENCOUNTER — Other Ambulatory Visit: Payer: Self-pay

## 2023-04-17 MED ORDER — PRASUGREL HCL 10 MG PO TABS: 10.0000 mg | ORAL_TABLET | Freq: Every day | ORAL | 3 refills | Status: DC

## 2023-04-17 MED ORDER — PANTOPRAZOLE SODIUM 40 MG PO TBEC: 40.0000 mg | DELAYED_RELEASE_TABLET | Freq: Every day | ORAL | 3 refills | Status: DC

## 2023-04-17 MED ORDER — EZETIMIBE 10 MG PO TABS: 10.0000 mg | ORAL_TABLET | Freq: Every day | ORAL | 3 refills | Status: DC

## 2023-05-04 ENCOUNTER — Telehealth: Payer: Self-pay | Admitting: Family Medicine

## 2023-05-04 NOTE — Telephone Encounter (Signed)
LVM and sent mychart msg informing pt of need to reschedule 07/28/23 appt - NP out

## 2023-05-19 NOTE — Progress Notes (Unsigned)
Cardiology Office Note:    Date:  05/21/2023   ID:  Wayne Vang, DOB 09/13/1960, MRN [id number]  PCP:  Hannah Beat, MD  Cardiologist:  Little Ishikawa, MD  Electrophysiologist:  None   Referring MD: Hannah Beat, MD   Chief Complaint  Patient presents with   Dizziness    Pt stated that he has had headaches about everyday also every  morning he takes his Meds and eats at night it feels like something is in his throat and chest    History of Present Illness:    Wayne Vang is a 62 y.o. male with a hx of CAD status post CABG 03/2022, seizures, hyperlipidemia who presents for follow-up.  Patient underwent CABG x 3 with LIMA-LAD, sequential SVG to ramus and OM on 03/26/2022.  Echocardiogram showed EF 60 to 65%.  He presented with chest pain on 04/17/2022 and was found to have inferior STEMI.  Had V-fib arrest in ED, ROSC obtained after 1 shock.  Cath showed 99% stenosis mid OM 2 insertion site of SVG, underwent successful angioplasty.  Echo showed EF 45 to 50% antiplatelet therapy complicated by phenytoin use for seizures, he was switched from Plavix to Effient.  He was admitted with chest pain again 04/30/2022, underwent cath which showed patent bypass grafts.  Echo 11/10/22 showed EF 60-65%.  He reported recurrent chest pain and underwent stress PET on 01/27/2023 which showed lateral wall ischemia, normal myocardial blood flow reserve (though difficult to interpret in setting of prior CABG).  Underwent LHC 02/20/2023 which showed patent LIMA-LAD, patent SVG-ramus but 85% stenosis just distal to anastomosis to native vessel (appears significant size mismatch between SVG and native vessel), occluded continuation of SVG limb to OM1 but excellent flow from native vessel with only 55% ostial LCx disease, LVEDP 22; medical management recommended.  Since last clinic visit, he reports that he is doing okay.  States that his chest pain has improved.  Denies any exertional chest pain  recently.  Has not had to use any nitroglycerin.  Still reports some dizziness, particularly when he goes from lying to standing.  Denies any syncope.  Reports has been bruising easily, but denies any bleeding.  Past Medical History:  Diagnosis Date   Acute MI, 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   Esophageal reflux    HFrEF (heart failure with reduced ejection fraction) (HCC) 04/19/2022   Hiatal hernia    Hyperlipidemia    Low back pain    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   Seizure disorder (HCC)    Skin cancer    "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"   Tremor     Past Surgical History:  Procedure Laterality Date   COLONOSCOPY     CORONARY ARTERY BYPASS GRAFT N/A 03/26/2022   Procedure: CORONARY ARTERY BYPASS GRAFTING (CABG) X THREE, USING LEFT INTERNAL MAMMARY ARTERY AND RIGHT LEG GREATER SAPHENOUS VEIN;  Surgeon: Eugenio Hoes, MD;  Location: [facility] OR;  Service: Open Heart Surgery;  Laterality: N/A;   CORONARY BALLOON ANGIOPLASTY N/A 04/17/2022   Procedure: CORONARY BALLOON ANGIOPLASTY;  Surgeon: Corky Crafts, MD;  Location: [facility] INVASIVE CV LAB;  Service: Cardiovascular;  Laterality: N/A;   INGUINAL HERNIA REPAIR Right 12/06/2013   Procedure:  REPAIR RIGHT INGUINAL HERNIA;  Surgeon: Clovis Pu. Cornett, MD;  Location: [facility];  Service: General;  Laterality: Right;   INGUINAL HERNIA REPAIR Left 07/21/2013   INSERTION OF MESH N/A 12/06/2013   Procedure: INSERTION OF MESH;  Surgeon: Clovis Pu.  Cornett, MD;  Location: [facility];  Service: General;  Laterality: N/A;   LEFT HEART CATH AND CORONARY ANGIOGRAPHY N/A 03/20/2022   Procedure: LEFT HEART CATH AND CORONARY ANGIOGRAPHY;  Surgeon: Corky Crafts, MD;  Location: [facility] INVASIVE CV LAB;  Service: Cardiovascular;  Laterality: N/A;   LEFT HEART CATH AND CORONARY ANGIOGRAPHY N/A 04/17/2022   Procedure: LEFT HEART CATH AND CORONARY ANGIOGRAPHY;  Surgeon: Corky Crafts, MD;  Location: [facility] INVASIVE CV LAB;  Service:  Cardiovascular;  Laterality: N/A;   LEFT HEART CATH AND CORS/GRAFTS ANGIOGRAPHY N/A 04/17/2022   Procedure: LEFT HEART CATH AND CORS/GRAFTS ANGIOGRAPHY;  Surgeon: Corky Crafts, MD;  Location: [facility] INVASIVE CV LAB;  Service: Cardiovascular;  Laterality: N/A;   LEFT HEART CATH AND CORS/GRAFTS ANGIOGRAPHY N/A 05/01/2022   Procedure: LEFT HEART CATH AND CORS/GRAFTS ANGIOGRAPHY;  Surgeon: Corky Crafts, MD;  Location: [facility] INVASIVE CV LAB;  Service: Cardiovascular;  Laterality: N/A;   LEFT HEART CATH AND CORS/GRAFTS ANGIOGRAPHY N/A 02/20/2023   Procedure: LEFT HEART CATH AND CORS/GRAFTS ANGIOGRAPHY;  Surgeon: Swazil[place], Peter M, MD;  Location: [facility] INVASIVE CV LAB;  Service: Cardiovascular;  Laterality: N/A;   POLYPECTOMY     TEE WITHOUT CARDIOVERSION N/A 03/26/2022   Procedure: TRANSESOPHAGEAL ECHOCARDIOGRAM (TEE);  Surgeon: Eugenio Hoes, MD;  Location: [facility] OR;  Service: Open Heart Surgery;  Laterality: N/A;    Current Medications: Current Meds  Medication Sig   acetaminophen (TYLENOL) 500 MG tablet Take 1,000 mg by mouth daily as needed for mild pain or headache.   aspirin EC 81 MG tablet Take 1 tablet (81 mg total) by mouth daily. Swallow whole.   Cholecalciferol (VITAMIN D-3 PO) Take 5,000 Units by mouth daily.   ezetimibe (ZETIA) 10 MG tablet Take 1 tablet (10 mg total) by mouth daily.   isosorbide mononitrate (IMDUR) 60 MG 24 hr tablet Take 1 tablet (60 mg total) by mouth daily.   metoprolol succinate (TOPROL-XL) 25 MG 24 hr tablet Take 1 tablet (25 mg total) by mouth daily.   nitroGLYCERIN (NITROSTAT) 0.4 MG SL tablet Place 1 tablet (0.4 mg total) under the tongue every 5 (five) minutes as needed for chest pain.   pantoprazole (PROTONIX) 40 MG tablet Take 1 tablet (40 mg total) by mouth daily.   phenytoin (DILANTIN) 100 MG ER capsule Take 2 capsules (200 mg total) by mouth every morning AND 3 capsules (300 mg total) at bedtime. Take 2 capsules (200 mg) every morning and then take 3 capsules  (300 mg) at bedtime.   rosuvastatin (CRESTOR) 40 MG tablet Take 1 tablet (40 mg total) by mouth daily.   [DISCONTINUED] prasugrel (EFFIENT) 10 MG TABS tablet Take 1 tablet (10 mg total) by mouth daily.     Allergies:   Nexium [esomeprazole], Zocor [simvastatin], and Gaviscon [alum hydroxide-mag carbonate]   Social History   Socioeconomic History   Marital status: Married    Spouse name: Not on file   Number of children: 1   Years of education: Not on file   Highest education level: Not on file  Occupational History   Occupation: Shop Nurse, mental health: [employer]    Comment: [employer]  Tobacco Use   Smoking status: Former    Current packs/day: 0.00    Types: Cigarettes    Quit date: 07/21/2002    Years since quitting: 20.8    Passive exposure: Never   Smokeless tobacco: Never  Vaping Use   Vaping status: Never  Used  Substance and Sexual Activity   Alcohol use: Yes    Comment: socially   Drug use: No   Sexual activity: Not on file  Other Topics Concern   Not on file  Social History Narrative      He has been married 31+ years.   Caffeine Use: He drinks coke, tea, and coffee.   Truck Curator   Social Determinants of Health   Financial Resource Strain: Not on file  Food Insecurity: No Food Insecurity (02/19/2023)   Hunger Vital Sign    Worried About Running Out of Food in the Last Year: Never true    Ran Out of Food in the Last Year: Never true  Transportation Needs: No Transportation Needs (02/19/2023)   PRAPARE - Administrator, Civil Service (Medical): No    Lack of Transportation (Non-Medical): No  Physical Activity: Not on file  Stress: Not on file  Social Connections: Not on file     Family History: The patient's family history includes Alcohol abuse in his brother; Alzheimer's disease in his mother; Cancer in his father; Colon polyps in his sister; Stroke in his sister. There is no history of Colon cancer, Esophageal  cancer, Stomach cancer, or Rectal cancer.  ROS:   Please see the history of present illness.     All other systems reviewed and are negative.  EKGs/Labs/Other Studies Reviewed:    The following studies were reviewed today:  EKG:   10/14/2022: Normal sinus rhythm, rate 69, no ST abnormalities, poor R wave progression  Recent Labs: 07/22/2022: ALT 18 02/19/2023: BUN 10; Potassium 3.7; Sodium 141 02/20/2023: Creatinine, Ser 0.99; Hemoglobin 15.0; Platelets 164  Recent Lipid Panel    Component Value Date/Time   CHOL 172 03/13/2023 0855   TRIG 77 03/13/2023 0855   HDL 89 03/13/2023 0855   CHOLHDL 1.9 03/13/2023 0855   CHOLHDL 2.1 04/17/2022 1238   VLDL 25 04/17/2022 1238   LDLCALC 69 03/13/2023 0855   LDLDIRECT 194.1 04/27/2009 0901    Physical Exam:    VS:  BP 104/66 (BP Location: Left Arm, Patient Position: Sitting, Cuff Size: Normal)   Pulse 60   Ht 6\' 2"  (1.88 m)   Wt 277 lb (125.6 kg)   SpO2 95%   BMI 35.56 kg/m     Wt Readings from Last 3 Encounters:  05/21/23 277 lb (125.6 kg)  03/13/23 224 lb (101.6 kg)  03/03/23 224 lb (101.6 kg)     GEN:  Well nourished, well developed in no acute distress HEENT: Normal NECK: No JVD; No carotid bruits CARDIAC: RRR, no murmurs, rubs, gallops RESPIRATORY:  Clear to auscultation without rales, wheezing or rhonchi  ABDOMEN: Soft, non-tender, non-distended MUSCULOSKELETAL:  No edema; No deformity  SKIN: Warm and dry NEUROLOGIC:  Alert and oriented x 3 PSYCHIATRIC:  Normal affect   ASSESSMENT:    1. Coronary artery disease of native artery of native heart with stable angina pectoris (HCC)   2. Ventricular fibrillation (HCC)   3. Heart failure with improved ejection fraction (HFimpEF) (HCC)   4. Hyperlipidemia, unspecified hyperlipidemia type       PLAN:    CAD: underwent CABG x 3 with LIMA-LAD, sequential SVG to ramus and OM on 03/26/2022.  Echocardiogram showed EF 60 to 65%.  He presented with chest pain on 04/17/2022 and  was found to have inferior STEMI.  Had V-fib arrest in ED, ROSC obtained after 1 shock.  Cath showed 99% stenosis mid OM 2 insertion  site of SVG, underwent successful angioplasty.  Echo showed EF 45 to 50% antiplatelet therapy complicated by phenytoin use for seizures, he was switched from Plavix to Effient.  He was admitted with chest pain again 04/30/2022, underwent cath which showed patent bypass grafts.  He reported recurrent chest pain and underwent stress PET on 01/27/2023 which showed lateral wall ischemia, normal myocardial blood flow reserve (though difficult to interpret in setting of prior CABG).  Underwent LHC 02/20/2023 which showed patent LIMA-LAD, patent SVG-ramus but 85% stenosis just distal to anastomosis to native vessel (appears significant size mismatch between SVG and native vessel), occluded continuation of SVG limb to OM1 but excellent flow from native vessel with only 55% ostial LCx disease, LVEDP 22; medical management recommended. -Continue aspirin.  Completed 1 year of DAPT with Effient (not started on ticagrelor due to interaction with phenytoin), can discontinue Effient -Continue rosuvastatin 40 mg daily and Zetia 10 mg daily -Continue Toprol-XL 25 mg daily -As needed sublingual nitroglycerin -Continue imdur 60 mg daily  VF arrest: On 04/09/2022 in setting of inferior STEMI.  ROSC obtained after 1 shock.  No significant ventricular arrhythmias following revascularization.  Continue Toprol-XL 25 mg daily  Heart failure with improved EF: EF 45 to 50% on echocardiogram 03/2022.  Echo 11/10/22 showed EF 60-65%. -Continue Toprol-XL 25 mg daily -Has not been on ACE/ARB/Arni due to soft BP  Hyperlipidemia: LDL 84 on 05/19/2022, on rosuvastatin 40 mg daily.  Zetia 10 mg daily added.  LDL 69 on 10/14/22.  Seizures: On phenytoin  RTC in 6 months   Medication Adjustments/Labs and Tests Ordered: Current medicines are reviewed at length with the patient today.  Concerns regarding  medicines are outlined above.  No orders of the defined types were placed in this encounter.  No orders of the defined types were placed in this encounter.   Patient Instructions  Medication Instructions:  STOP Prasugrel *If you need a refill on your cardiac medications before your next appointment, please call your pharmacy*  Wear Compression Socks while at work   Follow-Up: At [facility], you and your health needs are our priority.  As part of our continuing mission to provide you with exceptional heart care, we have created designated Provider Care Teams.  These Care Teams include your primary Cardiologist (physician) and Advanced Practice Providers (APPs -  Physician Assistants and Nurse Practitioners) who all work together to provide you with the care you need, when you need it.  We recommend signing up for the patient portal called "MyChart".  Sign up information is provided on this After Visit Summary.  MyChart is used to connect with patients for Virtual Visits (Telemedicine).  Patients are able to view lab/test results, encounter notes, upcoming appointments, etc.  Non-urgent messages can be sent to your provider as well.   To learn more about what you can do with MyChart, go to [email or web address].    Your next appointment:   6 month(s)  Provider:   Little Ishikawa, MD       Signed, Little Ishikawa, MD  05/21/2023 8:51 AM    [facility]

## 2023-05-21 ENCOUNTER — Encounter: Payer: Self-pay | Admitting: Cardiology

## 2023-05-21 ENCOUNTER — Ambulatory Visit: Payer: BC Managed Care – PPO | Admitting: Cardiology

## 2023-05-21 VITALS — BP 104/66 | HR 60 | Ht 74.0 in | Wt 277.0 lb

## 2023-05-21 DIAGNOSIS — I5032 Chronic diastolic (congestive) heart failure: Secondary | ICD-10-CM

## 2023-05-21 DIAGNOSIS — I25118 Atherosclerotic heart disease of native coronary artery with other forms of angina pectoris: Secondary | ICD-10-CM

## 2023-05-21 DIAGNOSIS — I4901 Ventricular fibrillation: Secondary | ICD-10-CM

## 2023-05-21 DIAGNOSIS — E785 Hyperlipidemia, unspecified: Secondary | ICD-10-CM | POA: Diagnosis not present

## 2023-05-21 NOTE — Patient Instructions (Signed)
Medication Instructions:  STOP Prasugrel *If you need a refill on your cardiac medications before your next appointment, please call your pharmacy*  Wear Compression Socks while at work   Follow-Up: At [facility], you and your health needs are our priority.  As part of our continuing mission to provide you with exceptional heart care, we have created designated Provider Care Teams.  These Care Teams include your primary Cardiologist (physician) and Advanced Practice Providers (APPs -  Physician Assistants and Nurse Practitioners) who all work together to provide you with the care you need, when you need it.  We recommend signing up for the patient portal called "MyChart".  Sign up information is provided on this After Visit Summary.  MyChart is used to connect with patients for Virtual Visits (Telemedicine).  Patients are able to view lab/test results, encounter notes, upcoming appointments, etc.  Non-urgent messages can be sent to your provider as well.   To learn more about what you can do with MyChart, go to [email or web address].    Your next appointment:   6 month(s)  Provider:   Little Ishikawa, MD

## 2023-06-05 ENCOUNTER — Other Ambulatory Visit: Payer: Self-pay | Admitting: Cardiology

## 2023-06-08 ENCOUNTER — Telehealth: Payer: Self-pay | Admitting: Cardiology

## 2023-06-08 NOTE — Telephone Encounter (Signed)
*  STAT* If patient is at the pharmacy, call can be transferred to refill team.   1. Which medications need to be refilled? (please list name of each medication and dose if known)   pantoprazole (PROTONIX) 40 MG tablet   2. Would you like to learn more about the convenience, safety, & potential cost savings by using the [facility]?   3. Are you open to using the [facility] (Type [facility]. ).   4. Which pharmacy/location (including street and city if local pharmacy) is medication to be sent to?  [facility] - [place], [place] - [address]   5. Do they need a 30 day or 90 day supply?  90 day  Wife Almira Coaster) stated patient still has some medication left.

## 2023-06-08 NOTE — Telephone Encounter (Signed)
PT is requesting a refill too soon.

## 2023-06-15 NOTE — Telephone Encounter (Signed)
Wife is following up. She states [facility] does not have the prescription and they are following up.

## 2023-06-16 ENCOUNTER — Other Ambulatory Visit: Payer: Self-pay

## 2023-06-16 MED ORDER — PANTOPRAZOLE SODIUM 40 MG PO TBEC: 40.0000 mg | DELAYED_RELEASE_TABLET | Freq: Every day | ORAL | 3 refills | Status: DC

## 2023-06-26 ENCOUNTER — Telehealth: Payer: Self-pay

## 2023-06-26 NOTE — Telephone Encounter (Signed)
**Note De-Identified Wayne Vang Obfuscation** Ordering provider: Micah Flesher, PA-c Associated diagnoses: Snoring-R06.83 and Somnolence-R40.0 WatchPAT PA obtained on 06/26/2023 by Wayne Vang, Wayne Formosa, LPN. Authorization: Per Wayne Vang at [employer] no PA is required for CPT Code: 91478 Patient notified of PIN (1234) on 06/26/2023 Wayne Vang Notification Method: MyChart message. No answer so I did leave a message on the pts Home and Cell phone VMs advising him that I have sent him a [facility], The message and that if he has any questions or concerns to contact us.  Phone note routed to covering staff for follow-up.

## 2023-06-29 NOTE — Telephone Encounter (Signed)
Noted. Thanks.

## 2023-07-01 DIAGNOSIS — C44519 Basal cell carcinoma of skin of other part of trunk: Secondary | ICD-10-CM | POA: Diagnosis not present

## 2023-07-01 DIAGNOSIS — D225 Melanocytic nevi of trunk: Secondary | ICD-10-CM | POA: Diagnosis not present

## 2023-07-01 DIAGNOSIS — D492 Neoplasm of unspecified behavior of bone, soft tissue, and skin: Secondary | ICD-10-CM | POA: Diagnosis not present

## 2023-07-01 DIAGNOSIS — Z08 Encounter for follow-up examination after completed treatment for malignant neoplasm: Secondary | ICD-10-CM | POA: Diagnosis not present

## 2023-07-01 DIAGNOSIS — L821 Other seborrheic keratosis: Secondary | ICD-10-CM | POA: Diagnosis not present

## 2023-07-01 DIAGNOSIS — L814 Other melanin hyperpigmentation: Secondary | ICD-10-CM | POA: Diagnosis not present

## 2023-07-20 NOTE — Telephone Encounter (Signed)
Lmom to f/u with sleep test.

## 2023-07-28 ENCOUNTER — Ambulatory Visit: Payer: BC Managed Care – PPO | Admitting: Family Medicine

## 2023-08-04 ENCOUNTER — Other Ambulatory Visit: Payer: Self-pay

## 2023-08-04 MED ORDER — PHENYTOIN SODIUM EXTENDED 100 MG PO CAPS: ORAL_CAPSULE | ORAL | 0 refills | Status: DC

## 2023-08-13 NOTE — Progress Notes (Deleted)
PATIENT: Wayne Vang DOB: [birth year]  REASON FOR VISIT: follow up HISTORY FROM: patient  No chief complaint on file.   HISTORY OF PRESENT ILLNESS:  08/13/23 ALL:  Wayne Vang returns for follow up for seizures. He continues phenytoin 200mg  every morning and 300mg  at bedtime.  07/22/2022 ALL:  Wayne Vang returns for follow up for seizures. He continues phenytoin 200mg  every morning and 300mg  at bedtime. He reports doing well from a seizure standpoint. 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 He feels that he is recovering fairly well. He is back to work but remains on light duty. He is a Environmental manager. He has had a few episodes of dizziness. No seizure events. He is drinking plenty of fluids daily. He continues statin, asa and Effient. He also continues vitamin D 5000iu daily. He is followed closely by PCP and cardiology.   03/27/2021 ALL: Wayne Vang returns for follow up for seizures. He continues phenytoin 200/300. No seizure activity. Vitamin D was low at last follow up 03/2020. He was given rx and advised to follow up with PCP for routine Dexa. He does not think this was done. He has tried to eat more vegetables and fruits. He is taking a calcium supplement but does not think he is taking vitamin D. He continues to have intermittent dizziness. Worse with working in the heat and worse with position changes. He is a Curator. He doesn't feel it is same dizziness as before that was diagnosed as vertigo. He is drinking 4 bottles of water and 4 bottles of Gatorade every day. He has follow up with Dr Patsy Lager next week to discuss. He does not routinely check BP. 137/78 today.   03/27/2020 ALL:  [name] is a 63 y.o. male here today for follow up for seizures. He continues phenytoin 200mg  in the mornings and 300mg  at bedtime. He is tolerating medication well. No recent seizure activity. Last seizure in 2006. He tried to wean phenytoin in 2018 but reports "feeling funny" and dose  was resumed.   He is followed routinely by PCP. He recently had a CPE. He reports that labs were normal. He is not sure if phenytoin levels were checked. He thinks he has had a recent DEXA but is unsure. He is taking a calcium supplement but unsure if it has vitamin D.     HISTORY: (copied from my note on 03/23/2019)  Wayne Vang is a 63 y.o. male here today for follow up for seizure disorder. He continues phenyotoin 200 in am and 300 in pm. He denies seizure activity. He is tolerating medication well with no adverse effects.    HISTORY: (copied from Dr Richrd Humbles note on 12/28/2017)   UPDATE (12/28/17, VRP): Since last visit, doing well. Tolerating dilantin. No alleviating or aggravating factors. Tried to lower to 200mg  twice a day, but felt "funny" and now back to 200 / 300.    UPDATE 12/23/16: Since last visit, no seizures. Tolerating dilantin. Having more joint aches and pain in the last few years. No other issues.    UPDATE 12/05/15: Since last visit, doing about the same. No seizures. Now off levetiracetam. Continues on phenytoin 200 / 300.    UPDATE 06/04/15: Doing well. No seizures. No vertigo attacks. Tolerating meds.    UPDATE 03/15/14 (LL): since last visit patient has been doing well, reports no seizures. Tolerating Keppra and Dilantin without known side effects.  Still with intermittent episodes of vertigo, unchanged. No complaints today.  UPDATE 12/07/12 (VRP): since last visit patient doing well. No more seizures. Last seizure was in 2006 in nighttime sleep. His has intermittent episodes of spinning sensation, vertigo, lasting for a few minutes of time. Meclizine seems to help.    PRIOR HPI (11/03/12, Dr. Sandria Manly): 63 year old right-handed white married male with a past history of recurrent dizziness beginning in 1985. EEG 08/09/1983 showed epileptiform activity bilaterally in the frontotemporal regions, more prominent on the right. His first generalized seizure was 08/04/1983 and the  second occurred 09/07/1983. Wayne Vang has been on Dilantin or phenytoin medication since 09/07/1993. His last generalized major motor seizure was 08/13/2004. At that time Keppra was added to his regiment and he has not had a seizure since. Medications are phenytoin sodium capsules 100 mg 2 in the morning and 3 at night and levitercetam 500 mg one half tablet twice per day. He is also on protonix 40 mg once daily. He discontinued simvastatin because of muscle pain. 11/27/2009 he was at work and developed nausea without vomiting. He had to sit down. He felt as if he had to focus on the wall and be very still. He was seen at [facility] emergency room where a Chest x-ray was normal and subsequent stress cardiac test was normal. CBC, basic metabolic panel, CPK, and cardiac markers were normal. A phenytoin level was 15.4. He has not had recurrent symptoms. His last EEG 05/19/2010 was normal. His last MRI of the brain 08/21/2004 showed no significant abnormalities. 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 He denies macropsia, micropsia, strange odors or tastes. The patient has headaches behind his eyes. Over the last 4 months he has recurrent dizziness. His has tingling in his left arm. At one point he was on meclizine, but is no longer taking it. He feels dizzy like "I'm on a boat". Sitting down or lying down helps. There is a sensation of "nearly spinning".The symptoms last 30 minutes to an hour. They can occur every 2-3 days. He is treating them with Dramamine 25 mg per day. He describes a lightheaded sensation or pressure sensation in the eyes.     REVIEW OF SYSTEMS: Out of a complete 14 system review of symptoms, the patient complains only of the following symptoms, dizziness and all other reviewed systems are negative.   ALLERGIES: Allergies  Allergen Reactions   Nexium [Esomeprazole] Other (See Comments)    Abdominal pain   Zocor [Simvastatin] Other (See Comments)     Ineffective     Gaviscon [Alum Hydroxide-Mag Carbonate] Other (See Comments)    Medication interaction only    HOME MEDICATIONS: Outpatient Medications Prior to Visit  Medication Sig Dispense Refill   acetaminophen (TYLENOL) 500 MG tablet Take 1,000 mg by mouth daily as needed for mild pain or headache.     aspirin EC 81 MG tablet Take 1 tablet (81 mg total) by mouth daily. Swallow whole. 30 tablet    Cholecalciferol (VITAMIN D-3 PO) Take 5,000 Units by mouth daily.     ezetimibe (ZETIA) 10 MG tablet Take 1 tablet (10 mg total) by mouth daily. 90 tablet 3   isosorbide mononitrate (IMDUR) 60 MG 24 hr tablet Take 1 tablet (60 mg total) by mouth daily.     metoprolol succinate (TOPROL-XL) 25 MG 24 hr tablet Take 1 tablet (25 mg total) by mouth daily. 90 tablet 3   nitroGLYCERIN (NITROSTAT) 0.4 MG SL tablet Place 1 tablet (0.4 mg total) under the tongue every  5 (five) minutes as needed for chest pain. 25 tablet 3   pantoprazole (PROTONIX) 40 MG tablet Take 1 tablet (40 mg total) by mouth daily. 90 tablet 3   phenytoin (DILANTIN) 100 MG ER capsule Take 2 capsules (200 mg total) by mouth every morning AND 3 capsules (300 mg total) at bedtime. Take 2 capsules (200 mg) every morning and then take 3 capsules (300 mg) at bedtime. 450 capsule 0   rosuvastatin (CRESTOR) 40 MG tablet Take 1 tablet (40 mg total) by mouth daily. 90 tablet 3   No facility-administered medications prior to visit.    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   Seizure disorder (HCC)    Skin cancer    "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"   Tremor     PAST SURGICAL HISTORY: Past Surgical History:  Procedure Laterality Date   COLONOSCOPY     CORONARY ARTERY BYPASS GRAFT N/A 03/26/2022   Procedure:  CORONARY ARTERY BYPASS GRAFTING (CABG) X THREE, USING LEFT INTERNAL MAMMARY ARTERY AND RIGHT LEG GREATER SAPHENOUS VEIN;  Surgeon: Eugenio Hoes, MD;  Location: [facility] OR;  Service: Open Heart Surgery;  Laterality: N/A;   CORONARY BALLOON ANGIOPLASTY N/A 04/17/2022   Procedure: CORONARY BALLOON ANGIOPLASTY;  Surgeon: Corky Crafts, MD;  Location: [facility] INVASIVE CV LAB;  Service: Cardiovascular;  Laterality: N/A;   INGUINAL HERNIA REPAIR Right 12/06/2013   Procedure:  REPAIR RIGHT INGUINAL HERNIA;  Surgeon: Clovis Pu. Cornett, MD;  Location: [facility];  Service: General;  Laterality: Right;   INGUINAL HERNIA REPAIR Left 07/21/2013   INSERTION OF MESH N/A 12/06/2013   Procedure: INSERTION OF MESH;  Surgeon: Clovis Pu. Cornett, MD;  Location: [facility];  Service: General;  Laterality: N/A;   LEFT HEART CATH AND CORONARY ANGIOGRAPHY N/A 03/20/2022   Procedure: LEFT HEART CATH AND CORONARY ANGIOGRAPHY;  Surgeon: Corky Crafts, MD;  Location: [facility] INVASIVE CV LAB;  Service: Cardiovascular;  Laterality: N/A;   LEFT HEART CATH AND CORONARY ANGIOGRAPHY N/A 04/17/2022   Procedure: LEFT HEART CATH AND CORONARY ANGIOGRAPHY;  Surgeon: Corky Crafts, MD;  Location: [facility] INVASIVE CV LAB;  Service: Cardiovascular;  Laterality: N/A;   LEFT HEART CATH AND CORS/GRAFTS ANGIOGRAPHY N/A 04/17/2022   Procedure: LEFT HEART CATH AND CORS/GRAFTS ANGIOGRAPHY;  Surgeon: Corky Crafts, MD;  Location: [facility] INVASIVE CV LAB;  Service: Cardiovascular;  Laterality: N/A;   LEFT HEART CATH AND CORS/GRAFTS ANGIOGRAPHY N/A 05/01/2022   Procedure: LEFT HEART CATH AND CORS/GRAFTS ANGIOGRAPHY;  Surgeon: Corky Crafts, MD;  Location: [facility] INVASIVE CV LAB;  Service: Cardiovascular;  Laterality: N/A;   LEFT HEART CATH AND CORS/GRAFTS ANGIOGRAPHY N/A 02/20/2023   Procedure: LEFT HEART CATH AND CORS/GRAFTS ANGIOGRAPHY;  Surgeon: Swaziland, Peter M, MD;  Location: [facility] INVASIVE CV LAB;  Service:  Cardiovascular;  Laterality: N/A;   POLYPECTOMY     TEE WITHOUT CARDIOVERSION N/A 03/26/2022   Procedure: TRANSESOPHAGEAL ECHOCARDIOGRAM (TEE);  Surgeon: Eugenio Hoes, MD;  Location: [facility] OR;  Service: Open Heart Surgery;  Laterality: N/A;    FAMILY HISTORY: Family History  Problem Relation Age of Onset   Alzheimer's disease Mother    Cancer Father    Colon polyps Sister  Stroke Sister    Alcohol abuse Brother    Colon cancer Neg Hx    Esophageal cancer Neg Hx    Stomach cancer Neg Hx    Rectal cancer Neg Hx     SOCIAL HISTORY: Social History   Socioeconomic History   Marital status: Married    Spouse name: Not on file   Number of children: 1   Years of education: Not on file   Highest education level: Not on file  Occupational History   Occupation: Shop Nurse, mental health: [employer]    Comment: [employer]  Tobacco Use   Smoking status: Former    Current packs/day: 0.00    Types: Cigarettes    Quit date: 07/21/2002    Years since quitting: 21.0    Passive exposure: Never   Smokeless tobacco: Never  Vaping Use   Vaping status: Never Used  Substance and Sexual Activity   Alcohol use: Yes    Comment: socially   Drug use: No   Sexual activity: Not on file  Other Topics Concern   Not on file  Social History Narrative      He has been married 31+ years.   Caffeine Use: He drinks coke, tea, and coffee.   Truck Curator   Social Drivers of Corporate investment banker Strain: Not on file  Food Insecurity: No Food Insecurity (02/19/2023)   Hunger Vital Sign    Worried About Running Out of Food in the Last Year: Never true    Ran Out of Food in the Last Year: Never true  Transportation Needs: No Transportation Needs (02/19/2023)   PRAPARE - Administrator, Civil Service (Medical): No    Lack of Transportation (Non-Medical): No  Physical Activity: Not on file  Stress: Not on file  Social Connections: Not on file  Intimate Partner  Violence: Not At Risk (02/19/2023)   Humiliation, Afraid, Rape, and Kick questionnaire    Fear of Current or Ex-Partner: No    Emotionally Abused: No    Physically Abused: No    Sexually Abused: No      PHYSICAL EXAM  There were no vitals filed for this visit.    There is no height or weight on file to calculate BMI.  Generalized: Well developed, in no acute distress  Cardiology: normal rate and rhythm, no murmur noted Respiratory: clear to auscultation bilaterally  Neurological examination  Mentation: Alert oriented to time, place, history taking. Follows all commands speech and language fluent Cranial nerve II-XII: Pupils were equal round reactive to light. Extraocular movements were full, visual field were full on confrontational test. Facial sensation and strength were normal. Head turning and shoulder shrug  were normal and symmetric. No nystagmus  Motor: The motor testing reveals 5 over 5 strength of all 4 extremities. Good symmetric motor tone is noted throughout.  Sensory: Sensory testing is intact to soft touch on all 4 extremities. No evidence of extinction is noted.  Gait and station: Gait is normal.     DIAGNOSTIC DATA (LABS, IMAGING, TESTING) - I reviewed patient records, labs, notes, testing and imaging myself where available.      No data to display           Lab Results  Component Value Date   WBC 5.0 02/20/2023   HGB 15.0 02/20/2023   HCT 42.6 02/20/2023   MCV 86.2 02/20/2023   PLT 164 02/20/2023      Component  Value Date/Time   NA 141 02/19/2023 1142   NA 143 10/14/2022 0844   K 3.7 02/19/2023 1142   CL 103 02/19/2023 1142   CO2 25 02/19/2023 1142   GLUCOSE 85 02/19/2023 1142   BUN 10 02/19/2023 1142   BUN 11 10/14/2022 0844   CREATININE 0.99 02/20/2023 1135   CALCIUM 9.2 02/19/2023 1142   PROT 7.2 07/22/2022 1600   ALBUMIN 5.0 (H) 07/22/2022 1600   AST 26 07/22/2022 1600   ALT 18 07/22/2022 1600   ALKPHOS 87 07/22/2022 1600   BILITOT  0.4 07/22/2022 1600   GFRNONAA >60 02/20/2023 1135   GFRAA 93 03/27/2020 1602   Lab Results  Component Value Date   CHOL 172 03/13/2023   HDL 89 03/13/2023   LDLCALC 69 03/13/2023   LDLDIRECT 194.1 04/27/2009   TRIG 77 03/13/2023   CHOLHDL 1.9 03/13/2023   Lab Results  Component Value Date   HGBA1C 4.4 (L) 04/17/2022   No results found for: "VITAMINB12" Lab Results  Component Value Date   TSH 1.321 03/19/2022     ASSESSMENT AND PLAN 63 y.o. year old male  has a past medical history of Acute MI, inferior wall (HCC) (04/17/2022), Esophageal reflux, HFrEF (heart failure with reduced ejection fraction) (HCC) (04/19/2022), Hiatal hernia, Hyperlipidemia, Low back pain, S/P CABG x 3 (03/26/2022), Seizure disorder (HCC), Skin cancer, and Tremor. here with   No diagnosis found.   Nathane is doing well on phenytoin 200mg  in am and 300mg  in pm. We will continue current treatment plan. I will update labs, today. He will monitor episodes of dizziness and call immediately with any concerns of seizure activity.  Healthy lifestyle habits encouraged. He will follow up closely with PCP and cardiology. He will follow up annually. He verbalizes understanding and agreement with this plan.    No orders of the defined types were placed in this encounter.     No orders of the defined types were placed in this encounter.     I spent 30 minutes of face-to-face and non-face-to-face time with patient.  This included previsit chart review, lab review, study review, order entry, electronic health record documentation, patient education.   Shawnie Dapper, FNP-C 08/13/2023, 9:18 AM [facility] [address], [address] [place], [place] [address] [phone]

## 2023-08-13 NOTE — Patient Instructions (Incomplete)
Below is our plan:  We will ***  Please make sure you are consistent with timing of seizure medication. I recommend annual visit with primary care provider (PCP) for complete physical and routine blood work. I recommend daily intake of vitamin D (400-800iu) and calcium (800-1000mg) for bone health. Discuss Dexa screening with PCP.   According to [place] law, you can not drive unless you are seizure / syncope free for at least 6 months and under physician's care.  Please maintain precautions. Do not participate in activities where a loss of awareness could harm you or someone else. No swimming alone, no tub bathing, no hot tubs, no driving, no operating motorized vehicles (cars, ATVs, motocycles, etc), lawnmowers, power tools or firearms. No standing at heights, such as rooftops, ladders or stairs. Avoid hot objects such as stoves, heaters, open fires. Wear a helmet when riding a bicycle, scooter, skateboard, etc. and avoid areas of traffic. Set your water heater to 120 degrees or less.  SUDEP is the sudden, unexpected death of someone with epilepsy, who was otherwise healthy. In SUDEP cases, no other cause of death is found when an autopsy is done. Each year, more than 1 in 1,000 people with epilepsy die from SUDEP. This is the leading cause of death in people with uncontrolled seizures. Until further answers are available, the best way to prevent SUDEP is to lower your risk by controlling seizures. Research has found that people with all types of epilepsy that experience convulsive seizures can be at risk.  Please make sure you are staying well hydrated. I recommend 50-60 ounces daily. Well balanced diet and regular exercise encouraged. Consistent sleep schedule with 6-8 hours recommended.   Please continue follow up with care team as directed.   Follow up with *** in ***  You may receive a survey regarding today's visit. I encourage you to leave honest feed back as I do use this information to improve  patient care. Thank you for seeing me today!    

## 2023-08-17 ENCOUNTER — Ambulatory Visit: Payer: BC Managed Care – PPO | Admitting: Family Medicine

## 2023-08-17 DIAGNOSIS — G40109 Localization-related (focal) (partial) symptomatic epilepsy and epileptic syndromes with simple partial seizures, not intractable, without status epilepticus: Secondary | ICD-10-CM

## 2023-09-10 NOTE — Telephone Encounter (Signed)
 Lmom to f/u with sleep test.

## 2023-10-09 ENCOUNTER — Telehealth: Payer: Self-pay | Admitting: Cardiology

## 2023-10-09 NOTE — Telephone Encounter (Signed)
 Pt c/o of Chest Pain: STAT if CP now or developed within 24 hours  1. Are you having CP right now?  Had chest pressure this morning but it started to fade as he was getting ready for work. Wife says he's at work now.  2. Are you experiencing any other symptoms (ex. SOB, nausea, vomiting, sweating)?  Wife reports chest pressure, patient didn't mention pain   3. How long have you been experiencing CP? Started Monday, came back today   4. Is your CP continuous or coming and going?  Coming and going   5. Have you taken Nitroglycerin?  Wife states he has it with him at work incase he needs it, but he hasn't mentioned taking it. Wife says he did take Aspirin ?

## 2023-10-09 NOTE — Telephone Encounter (Signed)
 Dr. Bjorn Pippin says he needs to go to ER if the CP is ongoing.  he has a signifcant cardiac history. Pt verbalized understanding he will go to ER to be evaluated.

## 2023-10-09 NOTE — Telephone Encounter (Signed)
 Returned call to pt he states that he has been having intermittent chest pressure(not chest pain)over the last couple weeks. Denies any nausea, SOB or sweating or any other symptoms. He states that it is located mid-lower ribcage. He has not taken Nitro just took 1 ASA this morning after his shower.. He states that did not help so he did not take another. He states that he does have his nitro with him. He did experience pressure when getting "out of the shower". Just continue to monitor, or?

## 2023-10-12 NOTE — Telephone Encounter (Signed)
Did he go to the ED? 

## 2023-10-19 NOTE — Telephone Encounter (Signed)
 Called patient and patient verbalized he did not go to ED. Patient reported he thinks it was gas and he drank a coke and it went away. Patient denies discomfort. Chest  pain or any other discomforts at this time. Made patient aware to call office for any questions. Understanding verbalized.

## 2023-11-02 ENCOUNTER — Other Ambulatory Visit: Payer: Self-pay | Admitting: *Deleted

## 2023-11-02 MED ORDER — PHENYTOIN SODIUM EXTENDED 100 MG PO CAPS: ORAL_CAPSULE | ORAL | 0 refills | Status: DC

## 2023-11-02 NOTE — Telephone Encounter (Signed)
 Last seen on 07/22/22 Follow up scheduled on 11/24/23

## 2023-11-03 ENCOUNTER — Other Ambulatory Visit: Payer: Self-pay

## 2023-11-03 MED ORDER — ROSUVASTATIN CALCIUM 40 MG PO TABS: 40.0000 mg | ORAL_TABLET | Freq: Every day | ORAL | 1 refills | Status: DC

## 2023-11-19 NOTE — Progress Notes (Unsigned)
 PATIENT: Wayne Vang DOB: 1961/02/01  REASON FOR VISIT: follow up HISTORY FROM: patient  No chief complaint on file.   HISTORY OF PRESENT ILLNESS:  11/19/23 ALL:  Wayne Vang returns for follow up for seizures. He continues phenytoin  200mg  every morning and 300mg  at bedtime.  Vitamin d ?  07/22/2022 ALL:  Wayne Vang returns for follow up for seizures. He continues phenytoin  200mg  every morning and 300mg  at bedtime. He reports doing well from a seizure standpoint. 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 He feels that he is recovering fairly well. He is back to work but remains on light duty. He is a Environmental manager. He has had a few episodes of dizziness. No seizure events. He is drinking plenty of fluids daily. He continues statin, asa and Effient . He also continues vitamin D  5000iu daily. He is followed closely by PCP and cardiology.   03/27/2021 ALL: Wayne Vang returns for follow up for seizures. He continues phenytoin  200/300. No seizure activity. Vitamin D  was low at last follow up 03/2020. He was given rx and advised to follow up with PCP for routine Dexa. He does not think this was done. He has tried to eat more vegetables and fruits. He is taking a calcium  supplement but does not think he is taking vitamin D . He continues to have intermittent dizziness. Worse with working in the heat and worse with position changes. He is a Curator. He doesn't feel it is same dizziness as before that was diagnosed as vertigo. He is drinking 4 bottles of water and 4 bottles of Gatorade every day. He has follow up with Dr Geralyn Knee next week to discuss. He does not routinely check BP. 137/78 today.   03/27/2020 ALL:  Wayne Vang is a 63 y.o. male here today for follow up for seizures. He continues phenytoin  200mg  in the mornings and 300mg  at bedtime. He is tolerating medication well. No recent seizure activity. Last seizure in 2006. He tried to wean phenytoin  in 2018 but reports "feeling  funny" and dose was resumed.   He is followed routinely by PCP. He recently had a CPE. He reports that labs were normal. He is not sure if phenytoin  levels were checked. He thinks he has had a recent DEXA but is unsure. He is taking a calcium  supplement but unsure if it has vitamin D .     HISTORY: (copied from my note on 03/23/2019)  Wayne Vang is a 63 y.o. male here today for follow up for seizure disorder. He continues phenyotoin 200 in am and 300 in pm. He denies seizure activity. He is tolerating medication well with no adverse effects.    HISTORY: (copied from Dr Elon Hakim note on 12/28/2017)   UPDATE (12/28/17, VRP): Since last visit, doing well. Tolerating dilantin . No alleviating or aggravating factors. Tried to lower to 200mg  twice a day, but felt "funny" and now back to 200 / 300.    UPDATE 12/23/16: Since last visit, no seizures. Tolerating dilantin . Having more joint aches and pain in the last few years. No other issues.    UPDATE 12/05/15: Since last visit, doing about the same. No seizures. Now off levetiracetam . Continues on phenytoin  200 / 300.    UPDATE 06/04/15: Doing well. No seizures. No vertigo attacks. Tolerating meds.    UPDATE 03/15/14 (LL): since last visit patient has been doing well, reports no seizures. Tolerating Keppra  and Dilantin  without known side effects.  Still with intermittent episodes of vertigo, unchanged.  No complaints today.   UPDATE 12/07/12 (VRP): since last visit patient doing well. No more seizures. Last seizure was in 2006 in nighttime sleep. His has intermittent episodes of spinning sensation, vertigo, lasting for a few minutes of time. Meclizine  seems to help.    PRIOR HPI (11/03/12, Dr. Dania Dupre): 63 year old right-handed white married male with a past history of recurrent dizziness beginning in 1985. EEG 08/09/1983 showed epileptiform activity bilaterally in the frontotemporal regions, more prominent on the right. His first generalized seizure was  08/04/1983 and the second occurred 09/07/1983. Wayne Vang has been on Dilantin  or phenytoin  medication since 09/07/1993. His last generalized major motor seizure was 08/13/2004. At that time Keppra  was added to his regiment and he has not had a seizure since. Medications are phenytoin  sodium capsules 100 mg 2 in the morning and 3 at night and levitercetam 500 mg one half tablet twice per day. He is also on protonix  40 mg once daily. He discontinued simvastatin because of muscle pain. 11/27/2009 he was at work and developed nausea without vomiting. He had to sit down. He felt as if he had to focus on the wall and be very still. He was seen at [facility] emergency room where a Chest x-ray was normal and subsequent stress cardiac test was normal. CBC, basic metabolic panel, CPK, and cardiac markers were normal. A phenytoin  level was 15.4. He has not had recurrent symptoms. His last EEG 05/19/2010 was normal. His last MRI of the brain 08/21/2004 showed no significant abnormalities. 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 He denies macropsia, micropsia, strange odors or tastes. The patient has headaches behind his eyes. Over the last 4 months he has recurrent dizziness. His has tingling in his left arm. At one point he was on meclizine , but is no longer taking it. He feels dizzy like "I'm on a boat". Sitting down or lying down helps. There is a sensation of "nearly spinning".The symptoms last 30 minutes to an hour. They can occur every 2-3 days. He is treating them with Dramamine 25 mg per day. He describes a lightheaded sensation or pressure sensation in the eyes.     REVIEW OF SYSTEMS: Out of a complete 14 system review of symptoms, the patient complains only of the following symptoms, dizziness and all other reviewed systems are negative.   ALLERGIES: Allergies  Allergen Reactions   Nexium [Esomeprazole] Other (See Comments)    Abdominal pain   Zocor [Simvastatin] Other (See  Comments)    Ineffective     Gaviscon [Alum Hydroxide-Mag Carbonate] Other (See Comments)    Medication interaction only    HOME MEDICATIONS: Outpatient Medications Prior to Visit  Medication Sig Dispense Refill   acetaminophen  (TYLENOL ) 500 MG tablet Take 1,000 mg by mouth daily as needed for mild pain or headache.     aspirin  EC 81 MG tablet Take 1 tablet (81 mg total) by mouth daily. Swallow whole. 30 tablet    Cholecalciferol (VITAMIN D -3 PO) Take 5,000 Units by mouth daily.     ezetimibe  (ZETIA ) 10 MG tablet Take 1 tablet (10 mg total) by mouth daily. 90 tablet 3   isosorbide  mononitrate (IMDUR ) 60 MG 24 hr tablet Take 1 tablet (60 mg total) by mouth daily.     metoprolol  succinate (TOPROL -XL) 25 MG 24 hr tablet Take 1 tablet (25 mg total) by mouth daily. 90 tablet 3   nitroGLYCERIN  (NITROSTAT ) 0.4 MG SL tablet Place 1 tablet (0.4 mg  total) under the tongue every 5 (five) minutes as needed for chest pain. 25 tablet 3   pantoprazole  (PROTONIX ) 40 MG tablet Take 1 tablet (40 mg total) by mouth daily. 90 tablet 3   phenytoin  (DILANTIN ) 100 MG ER capsule Take 2 capsules (200 mg total) by mouth every morning AND 3 capsules (300 mg total) at bedtime. Take 2 capsules (200 mg) every morning and then take 3 capsules (300 mg) at bedtime. 450 capsule 0   rosuvastatin  (CRESTOR ) 40 MG tablet Take 1 tablet (40 mg total) by mouth daily. 90 tablet 1   No facility-administered medications prior to visit.    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   Seizure disorder (HCC)    Skin cancer    "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"   Tremor     PAST SURGICAL HISTORY: Past Surgical History:  Procedure Laterality Date   COLONOSCOPY     CORONARY ARTERY BYPASS GRAFT N/A 03/26/2022    Procedure: CORONARY ARTERY BYPASS GRAFTING (CABG) X THREE, USING LEFT INTERNAL MAMMARY ARTERY AND RIGHT LEG GREATER SAPHENOUS VEIN;  Surgeon: Melene Sportsman, MD;  Location: [facility] OR;  Service: Open Heart Surgery;  Laterality: N/A;   CORONARY BALLOON ANGIOPLASTY N/A 04/17/2022   Procedure: CORONARY BALLOON ANGIOPLASTY;  Surgeon: Lucendia Rusk, MD;  Location: [facility] INVASIVE CV LAB;  Service: Cardiovascular;  Laterality: N/A;   INGUINAL HERNIA REPAIR Right 12/06/2013   Procedure:  REPAIR RIGHT INGUINAL HERNIA;  Surgeon: Brandy Cal. Cornett, MD;  Location: [facility];  Service: General;  Laterality: Right;   INGUINAL HERNIA REPAIR Left 07/21/2013   INSERTION OF MESH N/A 12/06/2013   Procedure: INSERTION OF MESH;  Surgeon: Brandy Cal. Cornett, MD;  Location: [facility];  Service: General;  Laterality: N/A;   LEFT HEART CATH AND CORONARY ANGIOGRAPHY N/A 03/20/2022   Procedure: LEFT HEART CATH AND CORONARY ANGIOGRAPHY;  Surgeon: Lucendia Rusk, MD;  Location: [facility] INVASIVE CV LAB;  Service: Cardiovascular;  Laterality: N/A;   LEFT HEART CATH AND CORONARY ANGIOGRAPHY N/A 04/17/2022   Procedure: LEFT HEART CATH AND CORONARY ANGIOGRAPHY;  Surgeon: Lucendia Rusk, MD;  Location: [facility] INVASIVE CV LAB;  Service: Cardiovascular;  Laterality: N/A;   LEFT HEART CATH AND CORS/GRAFTS ANGIOGRAPHY N/A 04/17/2022   Procedure: LEFT HEART CATH AND CORS/GRAFTS ANGIOGRAPHY;  Surgeon: Lucendia Rusk, MD;  Location: [facility] INVASIVE CV LAB;  Service: Cardiovascular;  Laterality: N/A;   LEFT HEART CATH AND CORS/GRAFTS ANGIOGRAPHY N/A 05/01/2022   Procedure: LEFT HEART CATH AND CORS/GRAFTS ANGIOGRAPHY;  Surgeon: Lucendia Rusk, MD;  Location: [facility] INVASIVE CV LAB;  Service: Cardiovascular;  Laterality: N/A;   LEFT HEART CATH AND CORS/GRAFTS ANGIOGRAPHY N/A 02/20/2023   Procedure: LEFT HEART CATH AND CORS/GRAFTS ANGIOGRAPHY;  Surgeon: Swaziland, Peter M, MD;  Location: [facility] INVASIVE CV LAB;  Service:  Cardiovascular;  Laterality: N/A;   POLYPECTOMY     TEE WITHOUT CARDIOVERSION N/A 03/26/2022   Procedure: TRANSESOPHAGEAL ECHOCARDIOGRAM (TEE);  Surgeon: Melene Sportsman, MD;  Location: [facility] OR;  Service: Open Heart Surgery;  Laterality: N/A;    FAMILY HISTORY: Family History  Problem Relation Age of Onset   Alzheimer's disease Mother    Cancer Father  Colon polyps Sister    Stroke Sister    Alcohol  abuse Brother    Colon cancer Neg Hx    Esophageal cancer Neg Hx    Stomach cancer Neg Hx    Rectal cancer Neg Hx     SOCIAL HISTORY: Social History   Socioeconomic History   Marital status: Married    Spouse name: Not on file   Number of children: 1   Years of education: Not on file   Highest education level: Not on file  Occupational History   Occupation: Shop Nurse, mental health: [employer]    Comment: [employer]  Tobacco Use   Smoking status: Former    Current packs/day: 0.00    Types: Cigarettes    Quit date: 07/21/2002    Years since quitting: 21.3    Passive exposure: Never   Smokeless tobacco: Never  Vaping Use   Vaping status: Never Used  Substance and Sexual Activity   Alcohol  use: Yes    Comment: socially   Drug use: No   Sexual activity: Not on file  Other Topics Concern   Not on file  Social History Narrative      He has been married 31+ years.   Caffeine Use: He drinks coke, tea, and coffee.   Truck Curator   Social Drivers of Corporate investment banker Strain: Not on file  Food Insecurity: No Food Insecurity (02/19/2023)   Hunger Vital Sign    Worried About Running Out of Food in the Last Year: Never true    Ran Out of Food in the Last Year: Never true  Transportation Needs: No Transportation Needs (02/19/2023)   PRAPARE - Administrator, Civil Service (Medical): No    Lack of Transportation (Non-Medical): No  Physical Activity: Not on file  Stress: Not on file  Social Connections: Not on file  Intimate Partner  Violence: Not At Risk (02/19/2023)   Humiliation, Afraid, Rape, and Kick questionnaire    Fear of Current or Ex-Partner: No    Emotionally Abused: No    Physically Abused: No    Sexually Abused: No      PHYSICAL EXAM  There were no vitals filed for this visit.    There is no height or weight on file to calculate BMI.  Generalized: Well developed, in no acute distress  Cardiology: normal rate and rhythm, no murmur noted Respiratory: clear to auscultation bilaterally  Neurological examination  Mentation: Alert oriented to time, place, history taking. Follows all commands speech and language fluent Cranial nerve II-XII: Pupils were equal round reactive to light. Extraocular movements were full, visual field were full on confrontational test. Facial sensation and strength were normal. Head turning and shoulder shrug  were normal and symmetric. No nystagmus  Motor: The motor testing reveals 5 over 5 strength of all 4 extremities. Good symmetric motor tone is noted throughout.  Sensory: Sensory testing is intact to soft touch on all 4 extremities. No evidence of extinction is noted.  Gait and station: Gait is normal.     DIAGNOSTIC DATA (LABS, IMAGING, TESTING) - I reviewed patient records, labs, notes, testing and imaging myself where available.      No data to display           Lab Results  Component Value Date   WBC 5.0 02/20/2023   HGB 15.0 02/20/2023   HCT 42.6 02/20/2023   MCV 86.2 02/20/2023   PLT 164 02/20/2023  Component Value Date/Time   NA 141 02/19/2023 1142   NA 143 10/14/2022 0844   K 3.7 02/19/2023 1142   CL 103 02/19/2023 1142   CO2 25 02/19/2023 1142   GLUCOSE 85 02/19/2023 1142   BUN 10 02/19/2023 1142   BUN 11 10/14/2022 0844   CREATININE 0.99 02/20/2023 1135   CALCIUM  9.2 02/19/2023 1142   PROT 7.2 07/22/2022 1600   ALBUMIN  5.0 (H) 07/22/2022 1600   AST 26 07/22/2022 1600   ALT 18 07/22/2022 1600   ALKPHOS 87 07/22/2022 1600   BILITOT  0.4 07/22/2022 1600   GFRNONAA >60 02/20/2023 1135   GFRAA 93 03/27/2020 1602   Lab Results  Component Value Date   CHOL 172 03/13/2023   HDL 89 03/13/2023   LDLCALC 69 03/13/2023   LDLDIRECT 194.1 04/27/2009   TRIG 77 03/13/2023   CHOLHDL 1.9 03/13/2023   Lab Results  Component Value Date   HGBA1C 4.4 (L) 04/17/2022   No results found for: "VITAMINB12" Lab Results  Component Value Date   TSH 1.321 03/19/2022     ASSESSMENT AND PLAN 63 y.o. year old male  has a past medical history of Acute MI, inferior wall (HCC) (04/17/2022), Esophageal reflux, HFrEF (heart failure with reduced ejection fraction) (HCC) (04/19/2022), Hiatal hernia, Hyperlipidemia, Low back pain, S/P CABG x 3 (03/26/2022), Seizure disorder (HCC), Skin cancer, and Tremor. here with   No diagnosis found.   Gates is doing well on phenytoin  200mg  in am and 300mg  in pm. We will continue current treatment plan. I will update labs, today. He will monitor episodes of dizziness and call immediately with any concerns of seizure activity.  Healthy lifestyle habits encouraged. He will follow up closely with PCP and cardiology. He will follow up annually. He verbalizes understanding and agreement with this plan.    No orders of the defined types were placed in this encounter.     No orders of the defined types were placed in this encounter.     I spent 30 minutes of face-to-face and non-face-to-face time with patient.  This included previsit chart review, lab review, study review, order entry, electronic health record documentation, patient education.   Terrilyn Fick, FNP-C 11/19/2023, 4:13 PM [facility] [address], [address] [place], [place] [address] [phone]

## 2023-11-19 NOTE — Patient Instructions (Signed)
 Below is our plan:  We will continue phenytoin  200mg  in the morning and 300mg  in the evenings.   Please make sure you are consistent with timing of seizure medication. I recommend annual visit with primary care provider (PCP) for complete physical and routine blood work. I recommend daily intake of vitamin D  (400-800iu) and calcium  (800-1000mg ) for bone health. Discuss Dexa screening with PCP.   According to [place] law, you can not drive unless you are seizure / syncope free for at least 6 months and under physician's care.  Please maintain precautions. Do not participate in activities where a loss of awareness could harm you or someone else. No swimming alone, no tub bathing, no hot tubs, no driving, no operating motorized vehicles (cars, ATVs, motocycles, etc), lawnmowers, power tools or firearms. No standing at heights, such as rooftops, ladders or stairs. Avoid hot objects such as stoves, heaters, open fires. Wear a helmet when riding a bicycle, scooter, skateboard, etc. and avoid areas of traffic. Set your water heater to 120 degrees or less.  SUDEP is the sudden, unexpected death of someone with epilepsy, who was otherwise healthy. In SUDEP cases, no other cause of death is found when an autopsy is done. Each year, more than 1 in 1,000 people with epilepsy die from SUDEP. This is the leading cause of death in people with uncontrolled seizures. Until further answers are available, the best way to prevent SUDEP is to lower your risk by controlling seizures. Research has found that people with all types of epilepsy that experience convulsive seizures can be at risk.  Please make sure you are staying well hydrated. I recommend 50-60 ounces daily. Well balanced diet and regular exercise encouraged. Consistent sleep schedule with 6-8 hours recommended.   Please continue follow up with care team as directed.   Follow up with me in 1 year   You may receive a survey regarding today's visit. I encourage  you to leave honest feed back as I do use this information to improve patient care. Thank you for seeing me today!

## 2023-11-24 ENCOUNTER — Encounter: Payer: Self-pay | Admitting: Family Medicine

## 2023-11-24 ENCOUNTER — Ambulatory Visit: Payer: BC Managed Care – PPO | Admitting: Family Medicine

## 2023-11-24 VITALS — BP 116/76 | HR 66 | Ht 74.0 in | Wt 227.0 lb

## 2023-11-24 DIAGNOSIS — G40109 Localization-related (focal) (partial) symptomatic epilepsy and epileptic syndromes with simple partial seizures, not intractable, without status epilepticus: Secondary | ICD-10-CM

## 2023-11-24 DIAGNOSIS — E559 Vitamin D deficiency, unspecified: Secondary | ICD-10-CM

## 2023-11-24 MED ORDER — PHENYTOIN SODIUM EXTENDED 100 MG PO CAPS: ORAL_CAPSULE | ORAL | 3 refills | Status: DC

## 2023-11-25 LAB — COMPREHENSIVE METABOLIC PANEL WITH GFR
ALT: 18 IU/L (ref 0–44)
AST: 25 IU/L (ref 0–40)
Albumin: 4.9 g/dL (ref 3.9–4.9)
Alkaline Phosphatase: 88 IU/L (ref 44–121)
BUN/Creatinine Ratio: 11 (ref 10–24)
BUN: 10 mg/dL (ref 8–27)
Bilirubin Total: 0.4 mg/dL (ref 0.0–1.2)
CO2: 21 mmol/L (ref 20–29)
Calcium: 9.5 mg/dL (ref 8.6–10.2)
Chloride: 103 mmol/L (ref 96–106)
Creatinine, Ser: 0.93 mg/dL (ref 0.76–1.27)
Globulin, Total: 1.8 g/dL (ref 1.5–4.5)
Glucose: 85 mg/dL (ref 70–99)
Potassium: 4.2 mmol/L (ref 3.5–5.2)
Sodium: 142 mmol/L (ref 134–144)
Total Protein: 6.7 g/dL (ref 6.0–8.5)
eGFR: 93 mL/min/{1.73_m2} (ref >59)

## 2023-11-25 LAB — CBC WITH DIFFERENTIAL/PLATELET
Basophils Absolute: 0 10*3/uL (ref 0.0–0.2)
Basos: 1 %
EOS (ABSOLUTE): 0.2 10*3/uL (ref 0.0–0.4)
Eos: 3 %
Hematocrit: 41.1 % (ref 37.5–51.0)
Hemoglobin: 14.6 g/dL (ref 13.0–17.7)
Immature Grans (Abs): 0 10*3/uL (ref 0.0–0.1)
Immature Granulocytes: 0 %
Lymphocytes Absolute: 1.4 10*3/uL (ref 0.7–3.1)
Lymphs: 27 %
MCH: 31.8 pg (ref 26.6–33.0)
MCHC: 35.5 g/dL (ref 31.5–35.7)
MCV: 90 fL (ref 79–97)
Monocytes Absolute: 0.4 10*3/uL (ref 0.1–0.9)
Monocytes: 7 %
Neutrophils Absolute: 3.2 10*3/uL (ref 1.4–7.0)
Neutrophils: 62 %
Platelets: 167 10*3/uL (ref 150–450)
RBC: 4.59 x10E6/uL (ref 4.14–5.80)
RDW: 13 % (ref 11.6–15.4)
WBC: 5.2 10*3/uL (ref 3.4–10.8)

## 2023-11-25 LAB — PHENYTOIN LEVEL, TOTAL: Phenytoin (Dilantin), Serum: 25.2 ug/mL (ref 10.0–20.0)

## 2023-11-25 LAB — VITAMIN D 25 HYDROXY (VIT D DEFICIENCY, FRACTURES): Vit D, 25-Hydroxy: 57.1 ng/mL (ref 30.0–100.0)

## 2023-11-30 NOTE — Progress Notes (Unsigned)
 Cardiology Office Note:    Date:  12/01/2023   ID:  Wayne Vang, DOB Dec 05, 1960, MRN [id number]  PCP:  Scherrie Curt, MD  Cardiologist:  Wendie Hamburg, MD  Electrophysiologist:  None   Referring MD: Scherrie Curt, MD   Chief Complaint  Patient presents with   Coronary Artery Disease    History of Present Illness:    Wayne Vang is a 64 y.o. male with a hx of CAD status post CABG 03/2022, seizures, hyperlipidemia who presents for follow-up.  Patient underwent CABG x 3 with LIMA-LAD, sequential SVG to ramus and OM on 03/26/2022.  Echocardiogram showed EF 60 to 65%.  He presented with chest pain on 04/17/2022 and was found to have inferior STEMI.  Had V-fib arrest in ED, ROSC obtained after 1 shock.  Cath showed 99% stenosis mid OM 2 insertion site of SVG, underwent successful angioplasty.  Echo showed EF 45 to 50% antiplatelet therapy complicated by phenytoin  use for seizures, he was switched from Plavix  to Effient .  He was admitted with chest pain again 04/30/2022, underwent cath which showed patent bypass grafts.  Echo 11/10/22 showed EF 60-65%.  He reported recurrent chest pain and underwent stress PET on 01/27/2023 which showed lateral wall ischemia, normal myocardial blood flow reserve (though difficult to interpret in setting of prior CABG).  Underwent LHC 02/20/2023 which showed patent LIMA-LAD, patent SVG-ramus but 85% stenosis just distal to anastomosis to native vessel (appears significant size mismatch between SVG and native vessel), occluded continuation of SVG limb to OM1 but excellent flow from native vessel with only 55% ostial LCx disease, LVEDP 22; medical management recommended.  Since last clinic visit, he reports he is doing okay.  Had to take a nitroglycerin  in March but no chest pain since that time.  Does report some shortness of breath with significant exertion.  Does describe some lightheadedness with standing.  Past Medical History:  Diagnosis Date    Acute MI, 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   Esophageal reflux    HFrEF (heart failure with reduced ejection fraction) (HCC) 04/19/2022   Hiatal hernia    Hyperlipidemia    Low back pain    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   Seizure disorder (HCC)    Skin cancer    "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"   Tremor     Past Surgical History:  Procedure Laterality Date   COLONOSCOPY     CORONARY ARTERY BYPASS GRAFT N/A 03/26/2022   Procedure: CORONARY ARTERY BYPASS GRAFTING (CABG) X THREE, USING LEFT INTERNAL MAMMARY ARTERY AND RIGHT LEG GREATER SAPHENOUS VEIN;  Surgeon: Melene Sportsman, MD;  Location: [facility] OR;  Service: Open Heart Surgery;  Laterality: N/A;   CORONARY BALLOON ANGIOPLASTY N/A 04/17/2022   Procedure: CORONARY BALLOON ANGIOPLASTY;  Surgeon: Lucendia Rusk, MD;  Location: [facility] INVASIVE CV LAB;  Service: Cardiovascular;  Laterality: N/A;   INGUINAL HERNIA REPAIR Right 12/06/2013   Procedure:  REPAIR RIGHT INGUINAL HERNIA;  Surgeon: Brandy Cal. Cornett, MD;  Location: [facility];  Service: General;  Laterality: Right;   INGUINAL HERNIA REPAIR Left 07/21/2013   INSERTION OF MESH N/A 12/06/2013   Procedure: INSERTION OF MESH;  Surgeon: Brandy Cal. Cornett, MD;  Location: [facility];  Service: General;  Laterality: N/A;   LEFT HEART CATH AND CORONARY ANGIOGRAPHY N/A 03/20/2022   Procedure: LEFT HEART CATH AND CORONARY ANGIOGRAPHY;  Surgeon: Lucendia Rusk, MD;  Location: [facility] INVASIVE CV LAB;  Service: Cardiovascular;  Laterality: N/A;  LEFT HEART CATH AND CORONARY ANGIOGRAPHY N/A 04/17/2022   Procedure: LEFT HEART CATH AND CORONARY ANGIOGRAPHY;  Surgeon: Lucendia Rusk, MD;  Location: [facility] INVASIVE CV LAB;  Service: Cardiovascular;  Laterality: N/A;   LEFT HEART CATH AND CORS/GRAFTS ANGIOGRAPHY N/A 04/17/2022   Procedure: LEFT HEART CATH AND CORS/GRAFTS ANGIOGRAPHY;  Surgeon: Lucendia Rusk, MD;  Location: [facility] INVASIVE CV LAB;  Service: Cardiovascular;  Laterality: N/A;    LEFT HEART CATH AND CORS/GRAFTS ANGIOGRAPHY N/A 05/01/2022   Procedure: LEFT HEART CATH AND CORS/GRAFTS ANGIOGRAPHY;  Surgeon: Lucendia Rusk, MD;  Location: [facility] INVASIVE CV LAB;  Service: Cardiovascular;  Laterality: N/A;   LEFT HEART CATH AND CORS/GRAFTS ANGIOGRAPHY N/A 02/20/2023   Procedure: LEFT HEART CATH AND CORS/GRAFTS ANGIOGRAPHY;  Surgeon: Swaziland, Peter M, MD;  Location: [facility] INVASIVE CV LAB;  Service: Cardiovascular;  Laterality: N/A;   POLYPECTOMY     TEE WITHOUT CARDIOVERSION N/A 03/26/2022   Procedure: TRANSESOPHAGEAL ECHOCARDIOGRAM (TEE);  Surgeon: Melene Sportsman, MD;  Location: [facility] OR;  Service: Open Heart Surgery;  Laterality: N/A;    Current Medications: Current Meds  Medication Sig   acetaminophen  (TYLENOL ) 500 MG tablet Take 1,000 mg by mouth daily as needed for mild pain or headache.   aspirin  EC 81 MG tablet Take 1 tablet (81 mg total) by mouth daily. Swallow whole.   Cholecalciferol (VITAMIN D -3 PO) Take 5,000 Units by mouth daily.   ezetimibe  (ZETIA ) 10 MG tablet Take 1 tablet (10 mg total) by mouth daily.   isosorbide  mononitrate (IMDUR ) 60 MG 24 hr tablet Take 60 mg by mouth daily.   metoprolol  succinate (TOPROL -XL) 25 MG 24 hr tablet Take 1 tablet (25 mg total) by mouth daily.   pantoprazole  (PROTONIX ) 40 MG tablet Take 1 tablet (40 mg total) by mouth daily.   phenytoin  (DILANTIN ) 100 MG ER capsule Take 2 capsules (200 mg total) by mouth every morning AND 3 capsules (300 mg total) at bedtime. Take 2 capsules (200 mg) every morning and then take 3 capsules (300 mg) at bedtime.   rosuvastatin  (CRESTOR ) 40 MG tablet Take 1 tablet (40 mg total) by mouth daily.   [DISCONTINUED] prasugrel  (EFFIENT ) 10 MG TABS tablet Take 10 mg by mouth daily.     Allergies:   Nexium [esomeprazole], Zocor [simvastatin], and Gaviscon [alum hydroxide-mag carbonate]   Social History   Socioeconomic History   Marital status: Married    Spouse name: Not on file   Number of children: 1    Years of education: Not on file   Highest education level: Not on file  Occupational History   Occupation: Shop Nurse, mental health: [employer]    Comment: [employer]  Tobacco Use   Smoking status: Former    Current packs/day: 0.00    Types: Cigarettes    Quit date: 07/21/2002    Years since quitting: 21.3    Passive exposure: Never   Smokeless tobacco: Never  Vaping Use   Vaping status: Never Used  Substance and Sexual Activity   Alcohol  use: Yes    Comment: socially   Drug use: No   Sexual activity: Not on file  Other Topics Concern   Not on file  Social History Narrative      He has been married 31+ years.   Caffeine Use: He drinks coke, tea, and coffee.   Truck Curator   Social Drivers of Corporate investment banker Strain: Not on file  Food Insecurity: No Food  Insecurity (02/19/2023)   Hunger Vital Sign    Worried About Running Out of Food in the Last Year: Never true    Ran Out of Food in the Last Year: Never true  Transportation Needs: No Transportation Needs (02/19/2023)   PRAPARE - Administrator, Civil Service (Medical): No    Lack of Transportation (Non-Medical): No  Physical Activity: Not on file  Stress: Not on file  Social Connections: Not on file     Family History: The patient's family history includes Alcohol  abuse in his brother; Alzheimer's disease in his mother; Cancer in his father; Colon polyps in his sister; Stroke in his sister. There is no history of Colon cancer, Esophageal cancer, Stomach cancer, or Rectal cancer.  ROS:   Please see the history of present illness.     All other systems reviewed and are negative.  EKGs/Labs/Other Studies Reviewed:    The following studies were reviewed today:  EKG:   10/14/2022: Normal sinus rhythm, rate 69, no ST abnormalities, poor R wave progression 12/01/2023: Normal sinus rhythm, rate 78, no ST abnormalities, poor R wave progression  Recent Labs: 11/24/2023: ALT 18;  BUN 10; Creatinine, Ser 0.93; Hemoglobin 14.6; Platelets 167; Potassium 4.2; Sodium 142  Recent Lipid Panel    Component Value Date/Time   CHOL 172 03/13/2023 0855   TRIG 77 03/13/2023 0855   HDL 89 03/13/2023 0855   CHOLHDL 1.9 03/13/2023 0855   CHOLHDL 2.1 04/17/2022 1238   VLDL 25 04/17/2022 1238   LDLCALC 69 03/13/2023 0855   LDLDIRECT 194.1 04/27/2009 0901    Physical Exam:    VS:  BP 106/70   Pulse 78   Ht 6\' 2"  (1.88 m)   Wt 221 lb (100.2 kg)   SpO2 97%   BMI 28.37 kg/m     Wt Readings from Last 3 Encounters:  12/01/23 221 lb (100.2 kg)  11/24/23 227 lb (103 kg)  05/21/23 277 lb (125.6 kg)     GEN:  Well nourished, well developed in no acute distress HEENT: Normal NECK: No JVD; No carotid bruits CARDIAC: RRR, no murmurs, rubs, gallops RESPIRATORY:  Clear to auscultation without rales, wheezing or rhonchi  ABDOMEN: Soft, non-tender, non-distended MUSCULOSKELETAL:  No edema; No deformity  SKIN: Warm and dry NEUROLOGIC:  Alert and oriented x 3 PSYCHIATRIC:  Normal affect   ASSESSMENT:    1. Coronary artery disease of native artery of native heart with stable angina pectoris (HCC)   2. Heart failure with improved ejection fraction (HFimpEF) (HCC)   3. Hyperlipidemia, unspecified hyperlipidemia type        PLAN:    CAD: underwent CABG x 3 with LIMA-LAD, sequential SVG to ramus and OM on 03/26/2022.  Echocardiogram showed EF 60 to 65%.  He presented with chest pain on 04/17/2022 and was found to have inferior STEMI.  Had V-fib arrest in ED, ROSC obtained after 1 shock.  Cath showed 99% stenosis mid OM 2 insertion site of SVG, underwent successful angioplasty.  Echo showed EF 45 to 50% antiplatelet therapy complicated by phenytoin  use for seizures, he was switched from Plavix  to Effient .  He was admitted with chest pain again 04/30/2022, underwent cath which showed patent bypass grafts.  He reported recurrent chest pain and underwent stress PET on 01/27/2023 which  showed lateral wall ischemia, normal myocardial blood flow reserve (though difficult to interpret in setting of prior CABG).  Underwent LHC 02/20/2023 which showed patent LIMA-LAD, patent SVG-ramus but 85% stenosis just  distal to anastomosis to native vessel (appears significant size mismatch between SVG and native vessel), occluded continuation of SVG limb to OM1 but excellent flow from native vessel with only 55% ostial LCx disease, LVEDP 22; medical management recommended. -Continue aspirin .  Completed 1 year of DAPT with Effient  (not started on ticagrelor  due to interaction with phenytoin ), have discontinued Effient  -Continue rosuvastatin  40 mg daily and Zetia  10 mg daily -Continue Toprol -XL 25 mg daily -As needed sublingual nitroglycerin  -Continue imdur  60 mg daily; reports he has been taking an extra 30 mg Imdur  at night, would recommend only dosing once daily to prevent developing tolerance  VF arrest: On 04/09/2022 in setting of inferior STEMI.  ROSC obtained after 1 shock.  No significant ventricular arrhythmias following revascularization.  Continue Toprol -XL 25 mg daily  Heart failure with improved EF: EF 45 to 50% on echocardiogram 03/2022.  Echo 11/10/22 showed EF 60-65%. -Continue Toprol -XL 25 mg daily -Has not been on ACE/ARB/Arni due to soft BP  Hyperlipidemia: LDL 84 on 05/19/2022, on rosuvastatin  40 mg daily.  Zetia  10 mg daily added.  LDL 69 on 10/14/22.  Check lipid panel  Seizures: On phenytoin , no recent seizures  RTC in 6 months   Medication Adjustments/Labs and Tests Ordered: Current medicines are reviewed at length with the patient today.  Concerns regarding medicines are outlined above.  Orders Placed This Encounter  Procedures   Lipid panel   EKG 12-Lead   No orders of the defined types were placed in this encounter.   Patient Instructions  Medication Instructions:  Continue current medications *If you need a refill on your cardiac medications before your next  appointment, please call your pharmacy*  Lab Work: Fasting lipid panel in 1 month If you have labs (blood work) drawn today and your tests are completely normal, you will receive your results only by: MyChart Message (if you have MyChart) OR A paper copy in the mail If you have any lab test that is abnormal or we need to change your treatment, we will call you to review the results.  Testing/Procedures: none  Follow-Up: At [facility], you and your health needs are our priority.  As part of our continuing mission to provide you with exceptional heart care, our providers are all part of one team.  This team includes your primary Cardiologist (physician) and Advanced Practice Providers or APPs (Physician Assistants and Nurse Practitioners) who all work together to provide you with the care you need, when you need it.  Your next appointment:   6 month(s)  Provider:   Wendie Hamburg, MD    We recommend signing up for the patient portal called "MyChart".  Sign up information is provided on this After Visit Summary.  MyChart is used to connect with patients for Virtual Visits (Telemedicine).  Patients are able to view lab/test results, encounter notes, upcoming appointments, etc.  Non-urgent messages can be sent to your provider as well.   To learn more about what you can do with MyChart, go to [email or web address].   Other Instructions none       Signed, Wendie Hamburg, MD  12/01/2023 6:46 PM    [facility]

## 2023-12-01 ENCOUNTER — Ambulatory Visit: Payer: BC Managed Care – PPO | Attending: Cardiology | Admitting: Cardiology

## 2023-12-01 ENCOUNTER — Telehealth: Payer: Self-pay

## 2023-12-01 VITALS — BP 106/70 | HR 78 | Ht 74.0 in | Wt 221.0 lb

## 2023-12-01 DIAGNOSIS — I25118 Atherosclerotic heart disease of native coronary artery with other forms of angina pectoris: Secondary | ICD-10-CM | POA: Diagnosis not present

## 2023-12-01 DIAGNOSIS — I5032 Chronic diastolic (congestive) heart failure: Secondary | ICD-10-CM | POA: Diagnosis not present

## 2023-12-01 DIAGNOSIS — E785 Hyperlipidemia, unspecified: Secondary | ICD-10-CM

## 2023-12-01 NOTE — Telephone Encounter (Signed)
 Called patient regarding sleep study, left vm for patient to give office a call back

## 2023-12-01 NOTE — Patient Instructions (Signed)
 Medication Instructions:  Continue current medications *If you need a refill on your cardiac medications before your next appointment, please call your pharmacy*  Lab Work: Fasting lipid panel in 1 month If you have labs (blood work) drawn today and your tests are completely normal, you will receive your results only by: MyChart Message (if you have MyChart) OR A paper copy in the mail If you have any lab test that is abnormal or we need to change your treatment, we will call you to review the results.  Testing/Procedures: none  Follow-Up: At [facility], you and your health needs are our priority.  As part of our continuing mission to provide you with exceptional heart care, our providers are all part of one team.  This team includes your primary Cardiologist (physician) and Advanced Practice Providers or APPs (Physician Assistants and Nurse Practitioners) who all work together to provide you with the care you need, when you need it.  Your next appointment:   6 month(s)  Provider:   Wendie Hamburg, MD    We recommend signing up for the patient portal called "MyChart".  Sign up information is provided on this After Visit Summary.  MyChart is used to connect with patients for Virtual Visits (Telemedicine).  Patients are able to view lab/test results, encounter notes, upcoming appointments, etc.  Non-urgent messages can be sent to your provider as well.   To learn more about what you can do with MyChart, go to [email or web address].   Other Instructions none

## 2023-12-08 ENCOUNTER — Ambulatory Visit: Payer: Self-pay | Admitting: Family Medicine

## 2023-12-11 NOTE — Telephone Encounter (Signed)
 Lab appointment scheduled on 12/18/23 @ 8am

## 2023-12-18 ENCOUNTER — Other Ambulatory Visit (INDEPENDENT_AMBULATORY_CARE_PROVIDER_SITE_OTHER): Payer: Self-pay

## 2023-12-18 ENCOUNTER — Other Ambulatory Visit: Payer: Self-pay | Admitting: *Deleted

## 2023-12-18 DIAGNOSIS — Z0289 Encounter for other administrative examinations: Secondary | ICD-10-CM

## 2023-12-18 DIAGNOSIS — G40109 Localization-related (focal) (partial) symptomatic epilepsy and epileptic syndromes with simple partial seizures, not intractable, without status epilepticus: Secondary | ICD-10-CM

## 2023-12-19 LAB — PHENYTOIN LEVEL, TOTAL: Phenytoin (Dilantin), Serum: 25.4 ug/mL (ref 10.0–20.0)

## 2023-12-21 ENCOUNTER — Ambulatory Visit: Payer: Self-pay | Admitting: Family Medicine

## 2023-12-30 ENCOUNTER — Other Ambulatory Visit: Payer: Self-pay | Admitting: Family Medicine

## 2023-12-30 MED ORDER — PHENYTOIN SODIUM EXTENDED 100 MG PO CAPS: 200.0000 mg | ORAL_CAPSULE | Freq: Two times a day (BID) | ORAL | 3 refills | Status: AC

## 2024-01-07 DIAGNOSIS — I25118 Atherosclerotic heart disease of native coronary artery with other forms of angina pectoris: Secondary | ICD-10-CM | POA: Diagnosis not present

## 2024-01-08 LAB — LIPID PANEL
Chol/HDL Ratio: 1.9 ratio (ref 0.0–5.0)
Cholesterol, Total: 165 mg/dL (ref 100–199)
HDL: 87 mg/dL (ref >39)
LDL Chol Calc (NIH): 65 mg/dL (ref 0–99)
Triglycerides: 64 mg/dL (ref 0–149)
VLDL Cholesterol Cal: 13 mg/dL (ref 5–40)

## 2024-01-11 ENCOUNTER — Ambulatory Visit: Payer: Self-pay | Admitting: Cardiology

## 2024-01-21 ENCOUNTER — Telehealth: Payer: Self-pay | Admitting: Cardiology

## 2024-01-21 ENCOUNTER — Other Ambulatory Visit (HOSPITAL_BASED_OUTPATIENT_CLINIC_OR_DEPARTMENT_OTHER): Payer: Self-pay | Admitting: Cardiology

## 2024-01-21 ENCOUNTER — Other Ambulatory Visit: Payer: Self-pay

## 2024-01-21 MED ORDER — ISOSORBIDE MONONITRATE ER 60 MG PO TB24: 60.0000 mg | ORAL_TABLET | Freq: Every day | ORAL | 0 refills | Status: DC

## 2024-01-21 NOTE — Telephone Encounter (Signed)
 15 day supply sent to requested Pharmacy

## 2024-01-21 NOTE — Telephone Encounter (Signed)
*  STAT* If patient is at the pharmacy, call can be transferred to refill team.   1. Which medications need to be refilled? (please list name of each medication and dose if known)   isosorbide  mononitrate (IMDUR ) 60 MG 24 hr tablet   2. Would you like to learn more about the convenience, safety, & potential cost savings by using the [facility]?   3. Are you open to using the [facility] (Type [facility]. ).  4. Which pharmacy/location (including street and city if local pharmacy) is medication to be sent to?  [id number] [address] [place], [place] - [address]   5. Do they need a 30 day or 90 day supply?  2 weeks supply  Wife Rollo) stated patient will need a small supply of this medication sent to her local pharmacy until his prescription from [facility] comes in.  Wife stated patient is almost out of this medication.

## 2024-01-28 ENCOUNTER — Other Ambulatory Visit: Payer: Self-pay | Admitting: Cardiology

## 2024-02-27 ENCOUNTER — Encounter: Payer: Self-pay | Admitting: Family Medicine

## 2024-02-27 NOTE — Progress Notes (Signed)
 Justo Hengel T. Yoselyn Mcglade, MD, CAQ Sports [facility] [address] [place], [address]  Phone: [phone]  FAX: [phone]  Wayne Vang - 63 y.o. male  MRN [id number]  Date of Birth: [birth year]  Date: 02/29/2024  PCP: Watt Mirza, MD  Referral: Watt Mirza, MD  No chief complaint on file.  Patient Care Team: Watt Mirza, MD as PCP - General Kate Lonni CROME, MD as PCP - Cardiology (Cardiology) [facility], Jon Garre, [place] as Physician Assistant (Cardiology) Subjective:   Wayne Vang is a 63 y.o. pleasant patient who presents with the following:  Preventative Health Maintenance Visit:  Health Maintenance Summary Reviewed and updated, unless pt declines services.  Tobacco History Reviewed. Alcohol : No concerns, no excessive use Exercise Habits: Some activity, rec at least 30 mins 5 times a week STD concerns: no risk or activity to increase risk Drug Use: None  Patient is here for a general health maintenance exam, and I actually have not seen him in almost 2 years after an MI.  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 Chronic heart failure. He also has a seizure disorder and is followed by neurology.    Health Maintenance  Topic Date Due   COVID-19 Vaccine (1) Never done   Pneumococcal Vaccine: 19-49 Years (1 of 2 - PCV) Never done   Pneumococcal Vaccine: 50+ Years (1 of 2 - PCV) Never done   Zoster Vaccines- Shingrix (1 of 2) Never done   INFLUENZA VACCINE  10/18/2024 (Originally 02/19/2024)   Colonoscopy  08/20/2026   DTaP/Tdap/Td (2 - Td or Tdap) 09/01/2028   Hepatitis C Screening  Completed   HIV Screening  Completed   Hepatitis B Vaccines  Aged Out   HPV VACCINES  Aged Out   Meningococcal B Vaccine  Aged Out   Immunization History  Administered Date(s) Administered   Tdap 09/01/2018   Patient  Active Problem List   Diagnosis Date Noted   HFrEF (heart failure with reduced ejection fraction) (HCC) 04/19/2022    Priority: High   Acute MI, 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    Priority: High   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    Priority: High   ST elevation myocardial infarction (STEMI) (HCC) 03/19/2022    Priority: High   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    Priority: High   Hyperlipidemia 01/26/2009    Priority: Medium    Ventricular fibrillation (HCC)    GERD 04/21/2007    Past Medical History:  Diagnosis Date   Acute MI, 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   Esophageal reflux    HFrEF (heart failure with reduced ejection fraction) (HCC) 04/19/2022   Hiatal hernia    Hyperlipidemia    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   Seizure disorder (HCC)    Skin cancer    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    Past Surgical History:  Procedure Laterality Date   CORONARY ARTERY BYPASS GRAFT N/A 03/26/2022   Procedure: CORONARY ARTERY BYPASS GRAFTING (CABG) X THREE, USING LEFT INTERNAL MAMMARY ARTERY AND RIGHT LEG GREATER SAPHENOUS VEIN;  Surgeon: Maryjane Mt, MD;  Location: [facility] OR;  Service: Open Heart Surgery;  Laterality: N/A;   CORONARY BALLOON ANGIOPLASTY N/A 04/17/2022   Procedure: CORONARY BALLOON ANGIOPLASTY;  Surgeon: Dann Candyce RAMAN, MD;  Location: [facility] INVASIVE CV LAB;  Service: Cardiovascular;  Laterality: N/A;   INGUINAL HERNIA  REPAIR Right 12/06/2013   Procedure:  REPAIR RIGHT INGUINAL HERNIA;  Surgeon: Debby LABOR. Cornett, MD;  Location: [facility];  Service: General;  Laterality: Right;   INGUINAL HERNIA REPAIR Left 07/21/2013   INSERTION OF MESH N/A 12/06/2013   Procedure: INSERTION OF MESH;  Surgeon: Debby LABOR. Cornett, MD;  Location: [facility];  Service: General;  Laterality: N/A;   LEFT HEART CATH AND CORONARY ANGIOGRAPHY N/A 03/20/2022   Procedure: LEFT HEART CATH AND CORONARY ANGIOGRAPHY;  Surgeon: Dann Candyce RAMAN, MD;  Location: [facility] INVASIVE CV LAB;   Service: Cardiovascular;  Laterality: N/A;   LEFT HEART CATH AND CORONARY ANGIOGRAPHY N/A 04/17/2022   Procedure: LEFT HEART CATH AND CORONARY ANGIOGRAPHY;  Surgeon: Dann Candyce RAMAN, MD;  Location: [facility] INVASIVE CV LAB;  Service: Cardiovascular;  Laterality: N/A;   LEFT HEART CATH AND CORS/GRAFTS ANGIOGRAPHY N/A 04/17/2022   Procedure: LEFT HEART CATH AND CORS/GRAFTS ANGIOGRAPHY;  Surgeon: Dann Candyce RAMAN, MD;  Location: [facility] INVASIVE CV LAB;  Service: Cardiovascular;  Laterality: N/A;   LEFT HEART CATH AND CORS/GRAFTS ANGIOGRAPHY N/A 05/01/2022   Procedure: LEFT HEART CATH AND CORS/GRAFTS ANGIOGRAPHY;  Surgeon: Dann Candyce RAMAN, MD;  Location: [facility] INVASIVE CV LAB;  Service: Cardiovascular;  Laterality: N/A;   LEFT HEART CATH AND CORS/GRAFTS ANGIOGRAPHY N/A 02/20/2023   Procedure: LEFT HEART CATH AND CORS/GRAFTS ANGIOGRAPHY;  Surgeon: Swaziland, Peter M, MD;  Location: [facility] INVASIVE CV LAB;  Service: Cardiovascular;  Laterality: N/A;   TEE WITHOUT CARDIOVERSION N/A 03/26/2022   Procedure: TRANSESOPHAGEAL ECHOCARDIOGRAM (TEE);  Surgeon: Maryjane Mt, MD;  Location: [facility] OR;  Service: Open Heart Surgery;  Laterality: N/A;    Family History  Problem Relation Age of Onset   Alzheimer's disease Mother    Cancer Father    Colon polyps Sister    Stroke Sister    Alcohol  abuse Brother    Colon cancer Neg Hx    Esophageal cancer Neg Hx    Stomach cancer Neg Hx    Rectal cancer Neg Hx     Social History   Social History Narrative      He has been married 31+ years.   Caffeine Use: He drinks coke, tea, and coffee.   [employer]ruck [employer]    Past Medical History, Surgical History, Social History, Family History, Problem List, Medications, and Allergies have been reviewed and updated if relevant.  Review of Systems: Pertinent positives are listed above.  Otherwise, a full 14 point review of systems has been done in full and it is negative except where it is noted positive.  Objective:    There were no vitals taken for this visit. Ideal Body Weight:    Ideal Body Weight:   No results found.    04/01/2021    3:58 PM 02/22/2020    4:26 PM 09/01/2018    2:39 PM  Depression screen PHQ 2/9  Decreased Interest 0 0 0  Down, Depressed, Hopeless 0 0 0  PHQ - 2 Score 0 0 0     GEN: well developed, well nourished, no acute distress Eyes: conjunctiva and lids normal, PERRLA, EOMI ENT: TM clear, nares clear, oral exam WNL Neck: supple, no lymphadenopathy, no thyromegaly, no JVD Pulm: clear to auscultation and percussion, respiratory effort normal CV: regular rate and rhythm, S1-S2, no murmur, rub or gallop, no bruits, peripheral pulses normal and symmetric, no cyanosis, clubbing, edema or varicosities GI: soft, non-tender; no hepatosplenomegaly, masses; active bowel sounds all quadrants GU: deferred Lymph: no cervical,  axillary or inguinal adenopathy MSK: gait normal, muscle tone and strength WNL, no joint swelling, effusions, discoloration, crepitus  SKIN: clear, good turgor, color WNL, no rashes, lesions, or ulcerations Neuro: normal mental status, normal strength, sensation, and motion Psych: alert; oriented to person, place and time, normally interactive and not anxious or depressed in appearance.  All labs reviewed with patient. Results for orders placed or performed in visit on 12/18/23  Phenytoin  Level, Total   Collection Time: 12/18/23  8:09 AM  Result Value Ref Range   Phenytoin  (Dilantin ), Serum 25.4 (HH) 10.0 - 20.0 ug/mL    Assessment and Plan:     ICD-10-CM   1. Healthcare maintenance  Z00.00       Health Maintenance Exam: The patient's preventative maintenance and recommended screening tests for an annual wellness exam were reviewed in full today. Brought up to date unless services declined.  Counselled on the importance of diet, exercise, and its role in overall health and mortality. The patient's FH and SH was reviewed, including their home life,  tobacco status, and drug and alcohol  status.  Follow-up in 1 year for physical exam or additional follow-up below.  Disposition: No follow-ups on file.  No orders of the defined types were placed in this encounter.  There are no discontinued medications. No orders of the defined types were placed in this encounter.   Signed,  Jacques DASEN. Ajia Chadderdon, MD   Allergies as of 02/29/2024       Reactions   Nexium [esomeprazole] Other (See Comments)   Abdominal pain   Zocor [simvastatin] Other (See Comments)   Ineffective    Gaviscon [alum Hydroxide-mag Carbonate] Other (See Comments)   Medication interaction only        Medication List        Accurate as of February 27, 2024  8:58 AM. If you have any questions, ask your nurse or doctor.          acetaminophen  500 MG tablet Commonly known as: TYLENOL  Take 1,000 mg by mouth daily as needed for mild pain or headache.   aspirin  EC 81 MG tablet Take 1 tablet (81 mg total) by mouth daily. Swallow whole.   ezetimibe  10 MG tablet Commonly known as: ZETIA  Take 1 tablet (10 mg total) by mouth daily.   isosorbide  mononitrate 60 MG 24 hr tablet Commonly known as: IMDUR  TAKE 1 TABLET DAILY (NEW DOSE, DISCONTINUE 30 MG PRESCRIPTION)   isosorbide  mononitrate 60 MG 24 hr tablet Commonly known as: IMDUR  Take 1 tablet (60 mg total) by mouth daily.   metoprolol  succinate 25 MG 24 hr tablet Commonly known as: TOPROL -XL TAKE 1 TABLET DAILY   nitroGLYCERIN  0.4 MG SL tablet Commonly known as: Nitrostat  Place 1 tablet (0.4 mg total) under the tongue every 5 (five) minutes as needed for chest pain.   pantoprazole  40 MG tablet Commonly known as: PROTONIX  Take 1 tablet (40 mg total) by mouth daily.   phenytoin  100 MG ER capsule Commonly known as: DILANTIN  Take 2 capsules (200 mg total) by mouth 2 (two) times daily.   rosuvastatin  40 MG tablet Commonly known as: CRESTOR  Take 1 tablet (40 mg total) by mouth daily.   VITAMIN D -3  PO Take 5,000 Units by mouth daily.

## 2024-02-29 ENCOUNTER — Other Ambulatory Visit: Payer: Self-pay | Admitting: *Deleted

## 2024-02-29 ENCOUNTER — Ambulatory Visit (INDEPENDENT_AMBULATORY_CARE_PROVIDER_SITE_OTHER): Admitting: Family Medicine

## 2024-02-29 ENCOUNTER — Encounter: Payer: Self-pay | Admitting: Family Medicine

## 2024-02-29 VITALS — BP 124/70 | HR 68 | Temp 97.3°F | Ht 74.25 in | Wt 225.2 lb

## 2024-02-29 DIAGNOSIS — N401 Enlarged prostate with lower urinary tract symptoms: Secondary | ICD-10-CM | POA: Diagnosis not present

## 2024-02-29 DIAGNOSIS — Z125 Encounter for screening for malignant neoplasm of prostate: Secondary | ICD-10-CM

## 2024-02-29 DIAGNOSIS — Z0000 Encounter for general adult medical examination without abnormal findings: Secondary | ICD-10-CM

## 2024-02-29 NOTE — Patient Instructions (Signed)
 Prevnar-20 pneumonia vaccine - You are at much higher risk, since you have had heart problems and a heart attack in the past.

## 2024-03-02 LAB — PSA, TOTAL WITH REFLEX TO PSA, FREE: PSA, Total: 1 ng/mL (ref <=4.0)

## 2024-03-03 ENCOUNTER — Ambulatory Visit: Payer: Self-pay | Admitting: Family Medicine

## 2024-03-14 ENCOUNTER — Other Ambulatory Visit: Payer: Self-pay | Admitting: Cardiology

## 2024-03-14 MED ORDER — ROSUVASTATIN CALCIUM 40 MG PO TABS: 40.0000 mg | ORAL_TABLET | Freq: Every day | ORAL | 1 refills | Status: DC

## 2024-03-15 ENCOUNTER — Other Ambulatory Visit: Payer: Self-pay

## 2024-03-16 ENCOUNTER — Telehealth: Payer: Self-pay

## 2024-03-16 ENCOUNTER — Other Ambulatory Visit: Payer: Self-pay

## 2024-03-16 NOTE — Telephone Encounter (Signed)
 Pt's wife called in stating that pt's insurance doesn't fully cover pt's script and they can't afford script,and will need to have his medication sent to [facility].  Called pharmacy and cancelled pt's Phenytoin  ER 100MG .  Faxed signed script to [facility] [phone].

## 2024-03-22 ENCOUNTER — Other Ambulatory Visit: Payer: Self-pay

## 2024-03-22 MED ORDER — EZETIMIBE 10 MG PO TABS: 10.0000 mg | ORAL_TABLET | Freq: Every day | ORAL | 2 refills | Status: AC

## 2024-03-22 MED ORDER — PANTOPRAZOLE SODIUM 40 MG PO TBEC: 40.0000 mg | DELAYED_RELEASE_TABLET | Freq: Every day | ORAL | 2 refills | Status: DC

## 2024-03-31 ENCOUNTER — Telehealth: Payer: Self-pay | Admitting: Cardiology

## 2024-03-31 MED ORDER — ISOSORBIDE MONONITRATE ER 60 MG PO TB24: 60.0000 mg | ORAL_TABLET | Freq: Every day | ORAL | 0 refills | Status: AC

## 2024-03-31 NOTE — Telephone Encounter (Signed)
 Pt's medication was sent to pt's pharmacy as requested. Confirmation received.

## 2024-03-31 NOTE — Telephone Encounter (Signed)
*  STAT* If patient is at the pharmacy, call can be transferred to refill team.   1. Which medications need to be refilled? (please list name of each medication and dose if known)   isosorbide  mononitrate (IMDUR ) 60 MG 24 hr tablet     2. Would you like to learn more about the convenience, safety, & potential cost savings by using the [facility]? No   3. Are you open to using the [facility] (Type [facility].  No   4. Which pharmacy/location (including street and city if local pharmacy) is medication to be sent to? [id number] [address] [place], [place] - [address]     5. Do they need a 30 day or 90 day supply? 30 day

## 2024-04-08 ENCOUNTER — Other Ambulatory Visit: Payer: Self-pay | Admitting: Cardiology

## 2024-04-29 ENCOUNTER — Other Ambulatory Visit: Payer: Self-pay | Admitting: Cardiology

## 2024-05-05 ENCOUNTER — Telehealth: Payer: Self-pay | Admitting: Cardiology

## 2024-05-05 MED ORDER — PANTOPRAZOLE SODIUM 40 MG PO TBEC: 40.0000 mg | DELAYED_RELEASE_TABLET | Freq: Every day | ORAL | 2 refills | Status: AC

## 2024-05-05 NOTE — Telephone Encounter (Signed)
 Pt's medication was sent to pt's pharmacy as requested. Confirmation received.

## 2024-05-05 NOTE — Telephone Encounter (Signed)
*  STAT* If patient is at the pharmacy, call can be transferred to refill team.   1. Which medications need to be refilled? (please list name of each medication and dose if known)   pantoprazole  (PROTONIX ) 40 MG tablet     2. Would you like to learn more about the convenience, safety, & potential cost savings by using the [facility]?no   3. Are you open to using the [facility] ([facility].  ).no   4. Which pharmacy/location (including street and city if local pharmacy) is medication to be sent to?[facility] - [place], [place] - [address]    5. Do they need a 30 day or 90 day supply? 90 day

## 2024-05-18 DIAGNOSIS — L821 Other seborrheic keratosis: Secondary | ICD-10-CM | POA: Diagnosis not present

## 2024-05-18 DIAGNOSIS — L814 Other melanin hyperpigmentation: Secondary | ICD-10-CM | POA: Diagnosis not present

## 2024-05-18 DIAGNOSIS — D225 Melanocytic nevi of trunk: Secondary | ICD-10-CM | POA: Diagnosis not present

## 2024-05-18 DIAGNOSIS — Z08 Encounter for follow-up examination after completed treatment for malignant neoplasm: Secondary | ICD-10-CM | POA: Diagnosis not present

## 2024-05-18 DIAGNOSIS — L570 Actinic keratosis: Secondary | ICD-10-CM | POA: Diagnosis not present

## 2024-06-01 ENCOUNTER — Telehealth: Payer: Self-pay | Admitting: Family Medicine

## 2024-06-01 NOTE — Telephone Encounter (Signed)
 Copied from CRM [id number]. Topic: General - Call Back - No Documentation >> Jun 01, 2024 12:34 PM Alfonso ORN wrote: Reason for CRM: bill from [facility] saying service not covered. called insurance and said wrong code was entered. Pt wife spoke to [facility] billing to was told to contact pcp office to have it recoded. not covered based on diagnosis for pt ( not a routine test). Please contact pt to advise [phone]  / [phone]

## 2024-06-01 NOTE — Telephone Encounter (Signed)
 OK.  I gave a different code for an enlarged prostate.  Honestly, that is very strange.  I have never seen a screening PSA denied by insurance that I can recall.  I used the proper code, but I will add an additional one.

## 2024-06-05 NOTE — Progress Notes (Unsigned)
 Cardiology Office Note:    Date:  06/06/2024   ID:  Wayne Vang, DOB [birth year], MRN [id number]  PCP:  Watt Mirza, MD  Cardiologist:  Lonni LITTIE Nanas, MD  Electrophysiologist:  None   Referring MD: Watt Mirza, MD   Chief Complaint  Patient presents with   Coronary Artery Disease    History of Present Illness:    Wayne Vang is a 63 y.o. male with a hx of CAD status post CABG 03/2022, seizures, hyperlipidemia who presents for follow-up.  Patient underwent CABG x 3 with LIMA-LAD, sequential SVG to ramus and OM on 03/26/2022.  Echocardiogram showed EF 60 to 65%.  He presented with chest pain on 04/17/2022 and was found to have inferior STEMI.  Had V-fib arrest in ED, ROSC obtained after 1 shock.  Cath showed 99% stenosis mid OM 2 insertion site of SVG, underwent successful angioplasty.  Echo showed EF 45 to 50% antiplatelet therapy complicated by phenytoin  use for seizures, he was switched from Plavix  to Effient .  He was admitted with chest pain again 04/30/2022, underwent cath which showed patent bypass grafts.  Echo 11/10/22 showed EF 60-65%.  He reported recurrent chest pain and underwent stress PET on 01/27/2023 which showed lateral wall ischemia, normal myocardial blood flow reserve (though difficult to interpret in setting of prior CABG).  Underwent LHC 02/20/2023 which showed patent LIMA-LAD, patent SVG-ramus but 85% stenosis just distal to anastomosis to native vessel (appears significant size mismatch between SVG and native vessel), occluded continuation of SVG limb to OM1 but excellent flow from native vessel with only 55% ostial LCx disease, LVEDP 22; medical management recommended.  Since last clinic visit, he reports he is doing okay.  Had 1 episode of chest pain a few months ago for which he took nitroglycerin .  Otherwise denies any chest pain.  Does report some shortness of breath.  Reports some lightheadedness but denies any syncope.  Main complaint recently has  been muscle and joint aches.  Past Medical History:  Diagnosis Date   Acute MI, 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   Esophageal reflux    HFrEF (heart failure with reduced ejection fraction) (HCC) 04/19/2022   Hiatal hernia    Hyperlipidemia    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   Seizure disorder (HCC)    Skin cancer    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    Past Surgical History:  Procedure Laterality Date   CORONARY ARTERY BYPASS GRAFT N/A 03/26/2022   Procedure: CORONARY ARTERY BYPASS GRAFTING (CABG) X THREE, USING LEFT INTERNAL MAMMARY ARTERY AND RIGHT LEG GREATER SAPHENOUS VEIN;  Surgeon: Maryjane Mt, MD;  Location: [facility] OR;  Service: Open Heart Surgery;  Laterality: N/A;   CORONARY Vang ANGIOPLASTY N/A 04/17/2022   Procedure: CORONARY Vang ANGIOPLASTY;  Surgeon: Dann Candyce RAMAN, MD;  Location: [facility] INVASIVE CV LAB;  Service: Cardiovascular;  Laterality: N/A;   INGUINAL HERNIA REPAIR Right 12/06/2013   Procedure:  REPAIR RIGHT INGUINAL HERNIA;  Surgeon: Debby LABOR. Cornett, MD;  Location: [facility];  Service: General;  Laterality: Right;   INGUINAL HERNIA REPAIR Left 07/21/2013   INSERTION OF MESH N/A 12/06/2013   Procedure: INSERTION OF MESH;  Surgeon: Debby LABOR. Cornett, MD;  Location: [facility];  Service: General;  Laterality: N/A;   LEFT HEART CATH AND CORONARY ANGIOGRAPHY N/A 03/20/2022   Procedure: LEFT HEART CATH AND CORONARY ANGIOGRAPHY;  Surgeon: Dann Candyce RAMAN, MD;  Location: [facility] INVASIVE CV LAB;  Service: Cardiovascular;  Laterality: N/A;  LEFT HEART CATH AND CORONARY ANGIOGRAPHY N/A 04/17/2022   Procedure: LEFT HEART CATH AND CORONARY ANGIOGRAPHY;  Surgeon: Dann Candyce RAMAN, MD;  Location: [facility] INVASIVE CV LAB;  Service: Cardiovascular;  Laterality: N/A;   LEFT HEART CATH AND CORS/GRAFTS ANGIOGRAPHY N/A 04/17/2022   Procedure: LEFT HEART CATH AND CORS/GRAFTS ANGIOGRAPHY;  Surgeon: Dann Candyce RAMAN, MD;  Location: [facility] INVASIVE CV LAB;  Service:  Cardiovascular;  Laterality: N/A;   LEFT HEART CATH AND CORS/GRAFTS ANGIOGRAPHY N/A 05/01/2022   Procedure: LEFT HEART CATH AND CORS/GRAFTS ANGIOGRAPHY;  Surgeon: Dann Candyce RAMAN, MD;  Location: [facility] INVASIVE CV LAB;  Service: Cardiovascular;  Laterality: N/A;   LEFT HEART CATH AND CORS/GRAFTS ANGIOGRAPHY N/A 02/20/2023   Procedure: LEFT HEART CATH AND CORS/GRAFTS ANGIOGRAPHY;  Surgeon: Jordan, Peter M, MD;  Location: [facility] INVASIVE CV LAB;  Service: Cardiovascular;  Laterality: N/A;   TEE WITHOUT CARDIOVERSION N/A 03/26/2022   Procedure: TRANSESOPHAGEAL ECHOCARDIOGRAM (TEE);  Surgeon: Maryjane Mt, MD;  Location: [facility] OR;  Service: Open Heart Surgery;  Laterality: N/A;    Current Medications: Current Meds  Medication Sig   acetaminophen  (TYLENOL ) 500 MG tablet Take 1,000 mg by mouth daily as needed for mild pain or headache.   aspirin  EC 81 MG tablet Take 1 tablet (81 mg total) by mouth daily. Swallow whole.   Cholecalciferol (VITAMIN D -3) 125 MCG (5000 UT) TABS Take 1 tablet by mouth daily.   ezetimibe  (ZETIA ) 10 MG tablet Take 1 tablet (10 mg total) by mouth daily.   isosorbide  mononitrate (IMDUR ) 60 MG 24 hr tablet Take 1 tablet (60 mg total) by mouth daily.   metoprolol  succinate (TOPROL -XL) 25 MG 24 hr tablet TAKE 1 TABLET DAILY   nitroGLYCERIN  (NITROSTAT ) 0.4 MG SL tablet Place 1 tablet (0.4 mg total) under the tongue every 5 (five) minutes as needed for chest pain.   pantoprazole  (PROTONIX ) 40 MG tablet Take 1 tablet (40 mg total) by mouth daily.   phenytoin  (DILANTIN ) 100 MG ER capsule Take 2 capsules (200 mg total) by mouth 2 (two) times daily.   rosuvastatin  (CRESTOR ) 40 MG tablet TAKE 1 TABLET DAILY     Allergies:   Nexium [esomeprazole], Zocor [simvastatin], and Gaviscon [alum hydroxide-mag carbonate]   Social History   Socioeconomic History   Marital status: Married    Spouse name: Not on file   Number of children: 1   Years of education: Not on file   Highest education  level: 9th grade  Occupational History   Occupation: Shop Nurse, Mental Health: [employer]    Comment: [employer]  Tobacco Use   Smoking status: Former    Current packs/day: 0.00    Types: Cigarettes    Quit date: 07/21/2002    Years since quitting: 21.8    Passive exposure: Never   Smokeless tobacco: Never  Vaping Use   Vaping status: Never Used  Substance and Sexual Activity   Alcohol  use: Yes    Comment: socially   Drug use: No   Sexual activity: Not on file  Other Topics Concern   Not on file  Social History Narrative      He has been married 31+ years.   Caffeine Use: He drinks coke, tea, and coffee.   Truck curator   Social Drivers of Corporate Investment Banker Strain: Low Risk  (02/25/2024)   Overall Financial Resource Strain (CARDIA)    Difficulty of Paying Living Expenses: Not very hard  Food Insecurity: No Food Insecurity (  02/25/2024)   Hunger Vital Sign    Worried About Running Out of Food in the Last Year: Never true    Ran Out of Food in the Last Year: Never true  Transportation Needs: No Transportation Needs (02/25/2024)   PRAPARE - Administrator, Civil Service (Medical): No    Lack of Transportation (Non-Medical): No  Physical Activity: Insufficiently Active (02/25/2024)   Exercise Vital Sign    Days of Exercise per Week: 1 day    Minutes of Exercise per Session: 10 min  Stress: No Stress Concern Present (02/25/2024)   [employer] of Occupational Health - Occupational Stress Questionnaire    Feeling of Stress: Not at all  Social Connections: Socially Isolated (02/25/2024)   Social Connection and Isolation Panel    Frequency of Communication with Friends and Family: Once a week    Frequency of Social Gatherings with Friends and Family: Once a week    Attends Religious Services: Never    Database Administrator or Organizations: No    Attends Engineer, Structural: Not on file    Marital Status: Married      Family History: The patient's family history includes Alcohol  abuse in his brother; Alzheimer's disease in his mother; Cancer in his father; Colon polyps in his sister; Stroke in his sister. There is no history of Colon cancer, Esophageal cancer, Stomach cancer, or Rectal cancer.  ROS:   Please see the history of present illness.     All other systems reviewed and are negative.  EKGs/Labs/Other Studies Reviewed:    The following studies were reviewed today:  EKG:   10/14/2022: Normal sinus rhythm, rate 69, no ST abnormalities, poor R wave progression 12/01/2023: Normal sinus rhythm, rate 78, no ST abnormalities, poor R wave progression  Recent Labs: 11/24/2023: ALT 18; BUN 10; Creatinine, Ser 0.93; Hemoglobin 14.6; Platelets 167; Potassium 4.2; Sodium 142  Recent Lipid Panel    Component Value Date/Time   CHOL 165 01/07/2024 0933   TRIG 64 01/07/2024 0933   HDL 87 01/07/2024 0933   CHOLHDL 1.9 01/07/2024 0933   CHOLHDL 2.1 04/17/2022 1238   VLDL 25 04/17/2022 1238   LDLCALC 65 01/07/2024 0933   LDLDIRECT 194.1 04/27/2009 0901    Physical Exam:    VS:  BP 121/79   Pulse 81   Ht 6' 2 (1.88 m)   Wt 232 lb 6.4 oz (105.4 kg)   SpO2 95%   BMI 29.84 kg/m     Wt Readings from Last 3 Encounters:  06/06/24 232 lb 6.4 oz (105.4 kg)  02/29/24 225 lb 4 oz (102.2 kg)  12/01/23 221 lb (100.2 kg)     GEN:  Well nourished, well developed in no acute distress HEENT: Normal NECK: No JVD; No carotid bruits CARDIAC: RRR, no murmurs, rubs, gallops RESPIRATORY:  Clear to auscultation without rales, wheezing or rhonchi  ABDOMEN: Soft, non-tender, non-distended MUSCULOSKELETAL:  No edema; No deformity  SKIN: Warm and dry NEUROLOGIC:  Alert and oriented x 3 PSYCHIATRIC:  Normal affect   ASSESSMENT:    1. Coronary artery disease of native artery of native heart with stable angina pectoris   2. Heart failure with improved ejection fraction (HFimpEF) (HCC)   3. Hyperlipidemia,  unspecified hyperlipidemia type         PLAN:    CAD: underwent CABG x 3 with LIMA-LAD, sequential SVG to ramus and OM on 03/26/2022.  Echocardiogram showed EF 60 to 65%.  He presented with  chest pain on 04/17/2022 and was found to have inferior STEMI.  Had V-fib arrest in ED, ROSC obtained after 1 shock.  Cath showed 99% stenosis mid OM 2 insertion site of SVG, underwent successful angioplasty.  Echo showed EF 45 to 50% antiplatelet therapy complicated by phenytoin  use for seizures, he was switched from Plavix  to Effient .  He was admitted with chest pain again 04/30/2022, underwent cath which showed patent bypass grafts.  He reported recurrent chest pain and underwent stress PET on 01/27/2023 which showed lateral wall ischemia, normal myocardial blood flow reserve (though difficult to interpret in setting of prior CABG).  Underwent LHC 02/20/2023 which showed patent LIMA-LAD, patent SVG-ramus but 85% stenosis just distal to anastomosis to native vessel (appears significant size mismatch between SVG and native vessel), occluded continuation of SVG limb to OM1 but excellent flow from native vessel with only 55% ostial LCx disease, LVEDP 22; medical management recommended. -Continue aspirin .  Completed 1 year of DAPT with Effient  (not started on ticagrelor  due to interaction with phenytoin ), have discontinued Effient  -Continue rosuvastatin  40 mg daily and Zetia  10 mg daily.  He is reporting muscle/joint aches, could be related to rosuvastatin .  Will trial stopping rosuvastatin  x 2 weeks.  His LDL is above goal less than 55 despite max dose rosuvastatin  and Zetia , recommend referral to pharmacy lipid clinic to consider PCSK9 inhibitor -Continue Toprol -XL 25 mg daily -Continue imdur  60 mg daily -As needed sublingual nitroglycerin   VF arrest: On 04/09/2022 in setting of inferior STEMI.  ROSC obtained after 1 shock.  No significant ventricular arrhythmias following revascularization.  Continue Toprol -XL 25 mg  daily  Heart failure with improved EF: EF 45 to 50% on echocardiogram 03/2022.  Echo 11/10/22 showed EF 60-65%. -Continue Toprol -XL 25 mg daily -Has not been on ACE/ARB/Arni due to soft BP  Hyperlipidemia: LDL 84 on 05/19/2022, on rosuvastatin  40 mg daily.  Zetia  10 mg daily added.  LDL 65 on 01/07/2024 - He is reporting muscle/joint aches, could be related to rosuvastatin .  Will trial stopping rosuvastatin  x 2 weeks.  His LDL is above goal less than 55 despite max dose rosuvastatin  and Zetia , recommend referral to pharmacy lipid clinic to consider PCSK9 inhibitor  Seizures: On phenytoin , no recent seizures  RTC in 6 months   Medication Adjustments/Labs and Tests Ordered: Current medicines are reviewed at length with the patient today.  Concerns regarding medicines are outlined above.  Orders Placed This Encounter  Procedures   AMB Referral to [facility] Pharm[facility]   No orders of the defined types were placed in this encounter.   Patient Instructions  Medication Instructions:  STOP: Rosuvastatin  for 2 weeks and let our office know if your join pain improves *If you need a refill on your cardiac medications before your next appointment, please call your pharmacy*  Lab Work: NONE If you have labs (blood work) drawn today and your tests are completely normal, you will receive your results only by: MyChart Message (if you have MyChart) OR A paper copy in the mail If you have any lab test that is abnormal or we need to change your treatment, we will call you to review the results.  Testing/Procedures: NONE  Follow-Up: At [facility], you and your health needs are our priority.  As part of our continuing mission to provide you with exceptional heart care, our providers are all part of one team.  This team includes your primary Cardiologist (physician) and Advanced Practice Providers or APPs (Physician Assistants and Nurse  Practitioners) who all work together to provide you with  the care you need, when you need it.  Your next appointment:   6 month(s)  Provider:   Lonni LITTIE Nanas, MD   We recommend signing up for the patient portal called MyChart.  Sign up information is provided on this After Visit Summary.  MyChart is used to connect with patients for Virtual Visits (Telemedicine).  Patients are able to view lab/test results, encounter notes, upcoming appointments, etc.  Non-urgent messages can be sent to your provider as well.   To learn more about what you can do with MyChart, go to [email or web address].   Other Instructions Referral to PharmD for lipid           Signed, Lonni LITTIE Nanas, MD  06/06/2024 5:10 PM    [facility]

## 2024-06-06 ENCOUNTER — Encounter: Payer: Self-pay | Admitting: Cardiology

## 2024-06-06 ENCOUNTER — Ambulatory Visit: Attending: Cardiology | Admitting: Cardiology

## 2024-06-06 VITALS — BP 121/79 | HR 81 | Ht 74.0 in | Wt 232.4 lb

## 2024-06-06 DIAGNOSIS — I5020 Unspecified systolic (congestive) heart failure: Secondary | ICD-10-CM | POA: Diagnosis not present

## 2024-06-06 DIAGNOSIS — I25118 Atherosclerotic heart disease of native coronary artery with other forms of angina pectoris: Secondary | ICD-10-CM | POA: Diagnosis not present

## 2024-06-06 DIAGNOSIS — E785 Hyperlipidemia, unspecified: Secondary | ICD-10-CM | POA: Diagnosis not present

## 2024-06-06 NOTE — Patient Instructions (Signed)
 Medication Instructions:  STOP: Rosuvastatin  for 2 weeks and let our office know if your join pain improves *If you need a refill on your cardiac medications before your next appointment, please call your pharmacy*  Lab Work: NONE If you have labs (blood work) drawn today and your tests are completely normal, you will receive your results only by: MyChart Message (if you have MyChart) OR A paper copy in the mail If you have any lab test that is abnormal or we need to change your treatment, we will call you to review the results.  Testing/Procedures: NONE  Follow-Up: At [facility], you and your health needs are our priority.  As part of our continuing mission to provide you with exceptional heart care, our providers are all part of one team.  This team includes your primary Cardiologist (physician) and Advanced Practice Providers or APPs (Physician Assistants and Nurse Practitioners) who all work together to provide you with the care you need, when you need it.  Your next appointment:   6 month(s)  Provider:   Lonni LITTIE Nanas, MD   We recommend signing up for the patient portal called MyChart.  Sign up information is provided on this After Visit Summary.  MyChart is used to connect with patients for Virtual Visits (Telemedicine).  Patients are able to view lab/test results, encounter notes, upcoming appointments, etc.  Non-urgent messages can be sent to your provider as well.   To learn more about what you can do with MyChart, go to [email or web address].   Other Instructions Referral to PharmD for lipid

## 2024-06-22 ENCOUNTER — Encounter: Payer: Self-pay | Admitting: Cardiology

## 2024-06-24 NOTE — Telephone Encounter (Signed)
 Sounds good, we can cancel referral to pharmacy lipid clinic and patient will continue rosuvastatin 

## 2024-11-28 ENCOUNTER — Ambulatory Visit: Admitting: Family Medicine
